# Patient Record
Sex: Male | Born: 1940 | Race: White | Hispanic: No | Smoking: Current every day smoker
Health system: Southern US, Community
[De-identification: ages and names within clinical notes are randomized; demographics above are authoritative.]

## PROBLEM LIST (undated history)

## (undated) DIAGNOSIS — K219 Gastro-esophageal reflux disease without esophagitis: Secondary | ICD-10-CM

## (undated) DIAGNOSIS — M1990 Unspecified osteoarthritis, unspecified site: Secondary | ICD-10-CM

## (undated) DIAGNOSIS — K635 Polyp of colon: Secondary | ICD-10-CM

## (undated) DIAGNOSIS — E785 Hyperlipidemia, unspecified: Secondary | ICD-10-CM

## (undated) DIAGNOSIS — I499 Cardiac arrhythmia, unspecified: Secondary | ICD-10-CM

## (undated) DIAGNOSIS — J449 Chronic obstructive pulmonary disease, unspecified: Secondary | ICD-10-CM

## (undated) DIAGNOSIS — C4490 Unspecified malignant neoplasm of skin, unspecified: Secondary | ICD-10-CM

## (undated) DIAGNOSIS — F329 Major depressive disorder, single episode, unspecified: Secondary | ICD-10-CM

## (undated) DIAGNOSIS — B370 Candidal stomatitis: Secondary | ICD-10-CM

## (undated) DIAGNOSIS — I2510 Atherosclerotic heart disease of native coronary artery without angina pectoris: Secondary | ICD-10-CM

## (undated) DIAGNOSIS — F32A Depression, unspecified: Secondary | ICD-10-CM

## (undated) DIAGNOSIS — F419 Anxiety disorder, unspecified: Secondary | ICD-10-CM

## (undated) DIAGNOSIS — I10 Essential (primary) hypertension: Secondary | ICD-10-CM

## (undated) DIAGNOSIS — I714 Abdominal aortic aneurysm, without rupture: Secondary | ICD-10-CM

## (undated) HISTORY — DX: Unspecified osteoarthritis, unspecified site: M19.90

## (undated) HISTORY — DX: Polyp of colon: K63.5

## (undated) HISTORY — DX: Depression, unspecified: F32.A

## (undated) HISTORY — DX: Cardiac arrhythmia, unspecified: I49.9

## (undated) HISTORY — DX: Unspecified malignant neoplasm of skin, unspecified: C44.90

## (undated) HISTORY — PX: SKIN CANCER EXCISION: SHX779

## (undated) HISTORY — DX: Major depressive disorder, single episode, unspecified: F32.9

## (undated) HISTORY — DX: Abdominal aortic aneurysm, without rupture: I71.4

## (undated) HISTORY — PX: OTHER SURGICAL HISTORY: SHX169

---

## 2008-02-12 HISTORY — PX: TRANSURETHRAL RESECTION OF PROSTATE: SHX73

## 2008-09-08 ENCOUNTER — Ambulatory Visit: Payer: Self-pay | Admitting: Gastroenterology

## 2009-06-28 ENCOUNTER — Ambulatory Visit: Payer: Self-pay | Admitting: Gastroenterology

## 2010-02-28 ENCOUNTER — Ambulatory Visit: Payer: Self-pay | Admitting: Otolaryngology

## 2010-11-21 ENCOUNTER — Ambulatory Visit: Payer: Self-pay | Admitting: Internal Medicine

## 2010-12-24 ENCOUNTER — Other Ambulatory Visit: Payer: Self-pay | Admitting: Internal Medicine

## 2010-12-26 ENCOUNTER — Ambulatory Visit: Payer: Self-pay | Admitting: Internal Medicine

## 2011-02-12 DIAGNOSIS — I714 Abdominal aortic aneurysm, without rupture, unspecified: Secondary | ICD-10-CM

## 2011-02-12 HISTORY — DX: Abdominal aortic aneurysm, without rupture, unspecified: I71.40

## 2011-02-12 HISTORY — DX: Abdominal aortic aneurysm, without rupture: I71.4

## 2011-03-29 ENCOUNTER — Emergency Department: Payer: Self-pay | Admitting: Emergency Medicine

## 2011-03-30 LAB — URINALYSIS, COMPLETE
Bilirubin,UR: NEGATIVE
Glucose,UR: NEGATIVE mg/dL (ref 0–75)
Ketone: NEGATIVE
Ph: 5 (ref 4.5–8.0)
RBC,UR: 37 /HPF (ref 0–5)
Squamous Epithelial: NONE SEEN

## 2011-04-19 ENCOUNTER — Ambulatory Visit: Payer: Self-pay | Admitting: Urology

## 2011-04-19 LAB — BASIC METABOLIC PANEL
BUN: 10 mg/dL (ref 7–18)
Chloride: 107 mmol/L (ref 98–107)
Co2: 27 mmol/L (ref 21–32)
Creatinine: 0.89 mg/dL (ref 0.60–1.30)
EGFR (African American): >60
EGFR (Non-African Amer.): >60

## 2011-04-19 LAB — CBC WITH DIFFERENTIAL/PLATELET
Basophil %: 0.3 %
Eosinophil #: 0.2 10*3/uL (ref 0.0–0.7)
Eosinophil %: 4 %
HCT: 38.5 % — ABNORMAL LOW (ref 40.0–52.0)
HGB: 12.8 g/dL — ABNORMAL LOW (ref 13.0–18.0)
Lymphocyte %: 32.5 %
MCV: 88 fL (ref 80–100)
Monocyte %: 7.5 %
Neutrophil #: 3.2 10*3/uL (ref 1.4–6.5)
Neutrophil %: 55.7 %
RBC: 4.39 10*6/uL — ABNORMAL LOW (ref 4.40–5.90)
RDW: 17.2 % — ABNORMAL HIGH (ref 11.5–14.5)
WBC: 5.7 10*3/uL (ref 3.8–10.6)

## 2011-04-24 ENCOUNTER — Ambulatory Visit: Payer: Self-pay | Admitting: Urology

## 2011-04-25 LAB — CBC WITH DIFFERENTIAL/PLATELET
Basophil %: 0.3 %
Eosinophil #: 0.3 10*3/uL (ref 0.0–0.7)
HGB: 12.3 g/dL — ABNORMAL LOW (ref 13.0–18.0)
Lymphocyte #: 1.9 10*3/uL (ref 1.0–3.6)
MCH: 29.1 pg (ref 26.0–34.0)
MCHC: 32.9 g/dL (ref 32.0–36.0)
MCV: 88 fL (ref 80–100)
Monocyte #: 0.6 10*3/uL (ref 0.0–0.7)
Monocyte %: 8.3 %
Neutrophil #: 4.5 10*3/uL (ref 1.4–6.5)
Neutrophil %: 61.3 %
Platelet: 188 10*3/uL (ref 150–440)
WBC: 7.3 10*3/uL (ref 3.8–10.6)

## 2011-04-25 LAB — BASIC METABOLIC PANEL
Anion Gap: 8 (ref 7–16)
BUN: 7 mg/dL (ref 7–18)
Calcium, Total: 7.9 mg/dL — ABNORMAL LOW (ref 8.5–10.1)
Chloride: 106 mmol/L (ref 98–107)
Glucose: 93 mg/dL (ref 65–99)
Osmolality: 277 (ref 275–301)

## 2011-04-25 LAB — PATHOLOGY REPORT

## 2012-04-14 DIAGNOSIS — R972 Elevated prostate specific antigen [PSA]: Secondary | ICD-10-CM | POA: Insufficient documentation

## 2012-04-14 DIAGNOSIS — N401 Enlarged prostate with lower urinary tract symptoms: Secondary | ICD-10-CM | POA: Insufficient documentation

## 2012-04-20 ENCOUNTER — Ambulatory Visit: Payer: Self-pay | Admitting: Emergency Medicine

## 2012-04-20 DIAGNOSIS — I10 Essential (primary) hypertension: Secondary | ICD-10-CM

## 2012-04-20 LAB — CBC WITH DIFFERENTIAL/PLATELET
Basophil #: 0 10*3/uL (ref 0.0–0.1)
Basophil %: 0.3 %
Eosinophil %: 2 %
HCT: 48.5 % (ref 40.0–52.0)
HGB: 16.3 g/dL (ref 13.0–18.0)
Lymphocyte %: 27.3 %
MCH: 31.1 pg (ref 26.0–34.0)
MCV: 93 fL (ref 80–100)
Monocyte #: 0.5 x10 3/mm (ref 0.2–1.0)
Monocyte %: 6 %
Neutrophil %: 64.4 %
Platelet: 219 10*3/uL (ref 150–440)
RBC: 5.24 10*6/uL (ref 4.40–5.90)
RDW: 14.8 % — ABNORMAL HIGH (ref 11.5–14.5)
WBC: 7.6 10*3/uL (ref 3.8–10.6)

## 2012-04-20 LAB — BASIC METABOLIC PANEL
BUN: 10 mg/dL (ref 7–18)
Calcium, Total: 8.1 mg/dL — ABNORMAL LOW (ref 8.5–10.1)
Chloride: 103 mmol/L (ref 98–107)
Creatinine: 1.03 mg/dL (ref 0.60–1.30)
EGFR (African American): >60
Glucose: 90 mg/dL (ref 65–99)
Osmolality: 269 (ref 275–301)
Sodium: 135 mmol/L — ABNORMAL LOW (ref 136–145)

## 2012-04-21 ENCOUNTER — Inpatient Hospital Stay: Payer: Self-pay | Admitting: Emergency Medicine

## 2012-04-22 LAB — BASIC METABOLIC PANEL
Anion Gap: 2 — ABNORMAL LOW (ref 7–16)
BUN: 10 mg/dL (ref 7–18)
Calcium, Total: 7.6 mg/dL — ABNORMAL LOW (ref 8.5–10.1)
Chloride: 105 mmol/L (ref 98–107)
Co2: 29 mmol/L (ref 21–32)
Creatinine: 1.03 mg/dL (ref 0.60–1.30)
EGFR (African American): >60
Sodium: 136 mmol/L (ref 136–145)

## 2012-04-22 LAB — CBC WITH DIFFERENTIAL/PLATELET
Basophil #: 0 10*3/uL (ref 0.0–0.1)
Eosinophil #: 0 10*3/uL (ref 0.0–0.7)
Eosinophil %: 0 %
HCT: 40.9 % (ref 40.0–52.0)
Lymphocyte %: 14.1 %
MCHC: 33.2 g/dL (ref 32.0–36.0)
MCV: 93 fL (ref 80–100)
Monocyte #: 0.7 x10 3/mm (ref 0.2–1.0)
Monocyte %: 6.7 %
Neutrophil #: 8.3 10*3/uL — ABNORMAL HIGH (ref 1.4–6.5)
Neutrophil %: 79.1 %
Platelet: 192 10*3/uL (ref 150–440)
RBC: 4.41 10*6/uL (ref 4.40–5.90)
RDW: 14.7 % — ABNORMAL HIGH (ref 11.5–14.5)
WBC: 10.5 10*3/uL (ref 3.8–10.6)

## 2012-04-26 LAB — BASIC METABOLIC PANEL
Anion Gap: 5 — ABNORMAL LOW (ref 7–16)
Calcium, Total: 7.4 mg/dL — ABNORMAL LOW (ref 8.5–10.1)
Chloride: 106 mmol/L (ref 98–107)
Glucose: 106 mg/dL — ABNORMAL HIGH (ref 65–99)
Sodium: 136 mmol/L (ref 136–145)

## 2012-04-26 LAB — POTASSIUM: Potassium: 4 mmol/L (ref 3.5–5.1)

## 2012-04-26 LAB — CREATININE, SERUM: Creatinine: 0.99 mg/dL (ref 0.60–1.30)

## 2012-04-27 LAB — PATHOLOGY REPORT

## 2012-05-03 LAB — CREATININE, SERUM: Creatinine: 0.9 mg/dL (ref 0.60–1.30)

## 2013-10-15 ENCOUNTER — Observation Stay: Payer: Self-pay | Admitting: Specialist

## 2013-10-15 LAB — COMPREHENSIVE METABOLIC PANEL
ALBUMIN: 2.8 g/dL — AB (ref 3.4–5.0)
ALT: 27 U/L
ANION GAP: 4 — AB (ref 7–16)
Alkaline Phosphatase: 48 U/L
BUN: 8 mg/dL (ref 7–18)
Bilirubin,Total: 0.5 mg/dL (ref 0.2–1.0)
CREATININE: 1.09 mg/dL (ref 0.60–1.30)
Calcium, Total: 8.2 mg/dL — ABNORMAL LOW (ref 8.5–10.1)
Chloride: 110 mmol/L — ABNORMAL HIGH (ref 98–107)
Co2: 25 mmol/L (ref 21–32)
EGFR (African American): >60
Glucose: 114 mg/dL — ABNORMAL HIGH (ref 65–99)
OSMOLALITY: 277 (ref 275–301)
Potassium: 3.9 mmol/L (ref 3.5–5.1)
SGOT(AST): 15 U/L (ref 15–37)
Sodium: 139 mmol/L (ref 136–145)
Total Protein: 5.8 g/dL — ABNORMAL LOW (ref 6.4–8.2)

## 2013-10-15 LAB — CBC
HCT: 45.9 % (ref 40.0–52.0)
HGB: 15.1 g/dL (ref 13.0–18.0)
MCH: 31.3 pg (ref 26.0–34.0)
MCHC: 32.9 g/dL (ref 32.0–36.0)
MCV: 95 fL (ref 80–100)
Platelet: 189 10*3/uL (ref 150–440)
RBC: 4.84 10*6/uL (ref 4.40–5.90)
RDW: 14.9 % — ABNORMAL HIGH (ref 11.5–14.5)
WBC: 7.2 10*3/uL (ref 3.8–10.6)

## 2013-10-15 LAB — URINALYSIS, COMPLETE
BILIRUBIN, UR: NEGATIVE
Bacteria: NONE SEEN
Blood: NEGATIVE
Glucose,UR: NEGATIVE mg/dL (ref 0–75)
Ketone: NEGATIVE
Nitrite: NEGATIVE
PH: 7 (ref 4.5–8.0)
Protein: 30
RBC,UR: 3 /HPF (ref 0–5)
Specific Gravity: 1.015 (ref 1.003–1.030)
Squamous Epithelial: <1
WBC UR: 4 /HPF (ref 0–5)

## 2013-10-15 LAB — CK TOTAL AND CKMB (NOT AT ARMC)
CK, Total: 64 U/L
CK-MB: 1.8 ng/mL (ref 0.5–3.6)

## 2013-10-15 LAB — TROPONIN I: Troponin-I: <0.02 ng/mL

## 2013-10-16 DIAGNOSIS — R55 Syncope and collapse: Secondary | ICD-10-CM

## 2013-10-16 LAB — BASIC METABOLIC PANEL
ANION GAP: 8 (ref 7–16)
BUN: 11 mg/dL (ref 7–18)
CHLORIDE: 107 mmol/L (ref 98–107)
Calcium, Total: 8 mg/dL — ABNORMAL LOW (ref 8.5–10.1)
Co2: 26 mmol/L (ref 21–32)
Creatinine: 1.07 mg/dL (ref 0.60–1.30)
EGFR (African American): >60
Glucose: 94 mg/dL (ref 65–99)
Osmolality: 280 (ref 275–301)
POTASSIUM: 3.8 mmol/L (ref 3.5–5.1)
Sodium: 141 mmol/L (ref 136–145)

## 2013-10-16 LAB — CBC WITH DIFFERENTIAL/PLATELET
BASOS ABS: 0 10*3/uL (ref 0.0–0.1)
Basophil %: 0.7 %
EOS ABS: 0.2 10*3/uL (ref 0.0–0.7)
EOS PCT: 2.5 %
HCT: 45.8 % (ref 40.0–52.0)
HGB: 15.2 g/dL (ref 13.0–18.0)
Lymphocyte #: 2.1 10*3/uL (ref 1.0–3.6)
Lymphocyte %: 28.4 %
MCH: 31.2 pg (ref 26.0–34.0)
MCHC: 33.3 g/dL (ref 32.0–36.0)
MCV: 94 fL (ref 80–100)
MONOS PCT: 8.6 %
Monocyte #: 0.6 x10 3/mm (ref 0.2–1.0)
NEUTROS ABS: 4.4 10*3/uL (ref 1.4–6.5)
NEUTROS PCT: 59.8 %
PLATELETS: 177 10*3/uL (ref 150–440)
RBC: 4.89 10*6/uL (ref 4.40–5.90)
RDW: 14.9 % — ABNORMAL HIGH (ref 11.5–14.5)
WBC: 7.4 10*3/uL (ref 3.8–10.6)

## 2013-10-16 LAB — TROPONIN I: Troponin-I: <0.02 ng/mL

## 2013-10-16 LAB — CK-MB
CK-MB: 1.8 ng/mL (ref 0.5–3.6)
CK-MB: 1.8 ng/mL (ref 0.5–3.6)

## 2013-11-23 IMAGING — CT CT ABD-PELV W/ CM
1 of 3 series · 12 of 32 positions shown, 18 images · non-contrast
Comparison: none

REASON FOR EXAM: (1) abdominal wound dehiscence; (2) look at the uretes
COMMENTS:

[Series 2: soft tissue · axial · 0.85mm/px · z∈[-858,-452]mm · 12 of 159 slices shown, 18 images]
[im 12/159  soft-tissue]
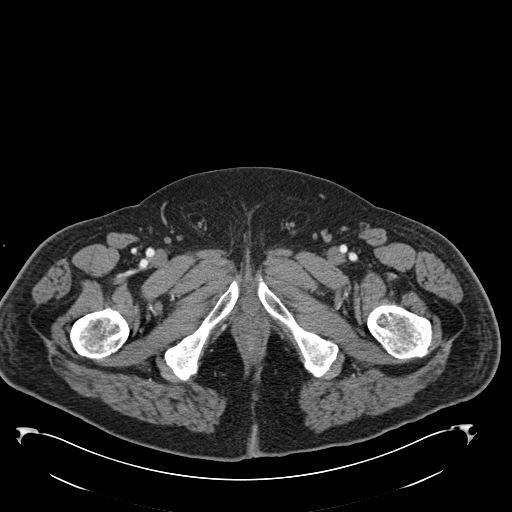
[im 12/159  bone]
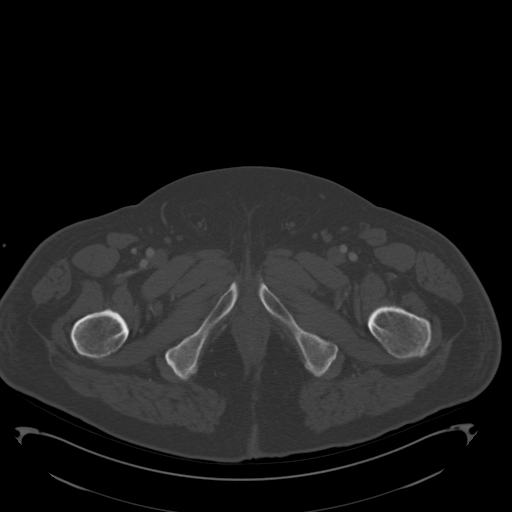
[im 23/159  soft-tissue]
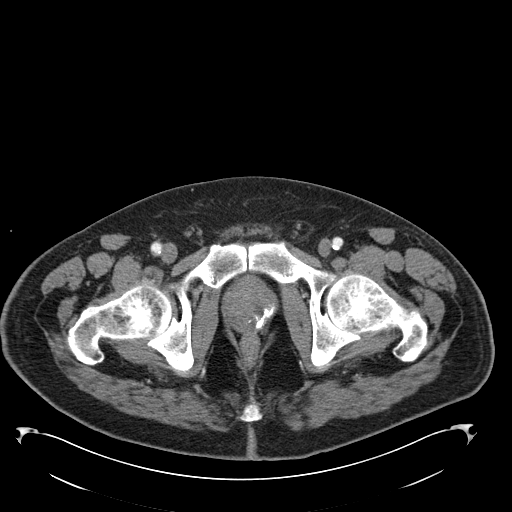
[im 34/159  soft-tissue]
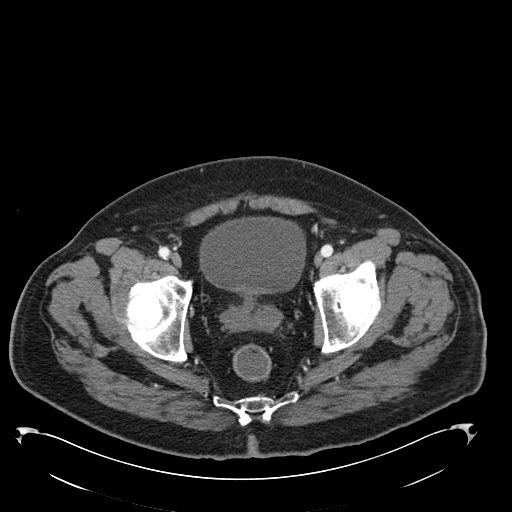
[im 46/159  soft-tissue]
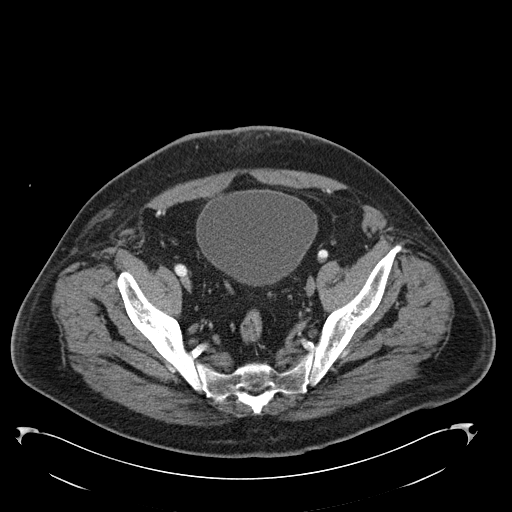
[im 57/159  soft-tissue]
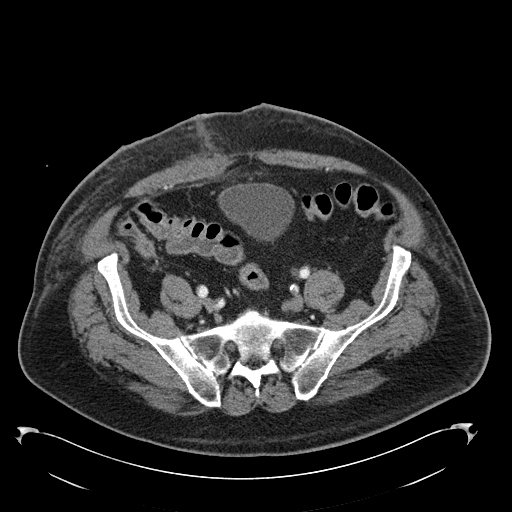
[im 68/159  soft-tissue]
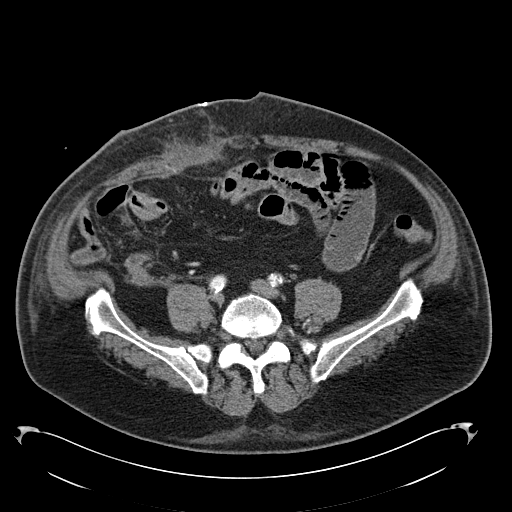
[im 91/159  soft-tissue]
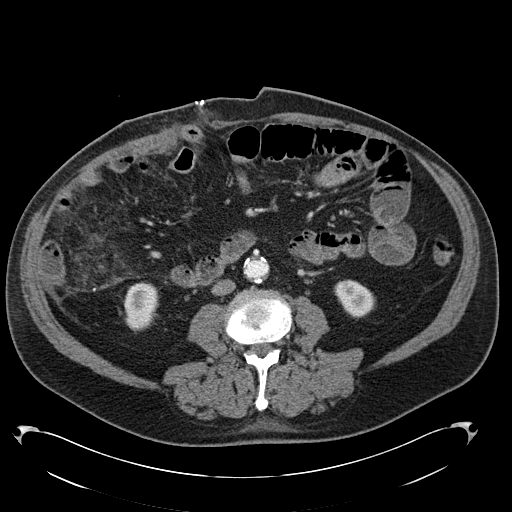
[im 102/159  soft-tissue]
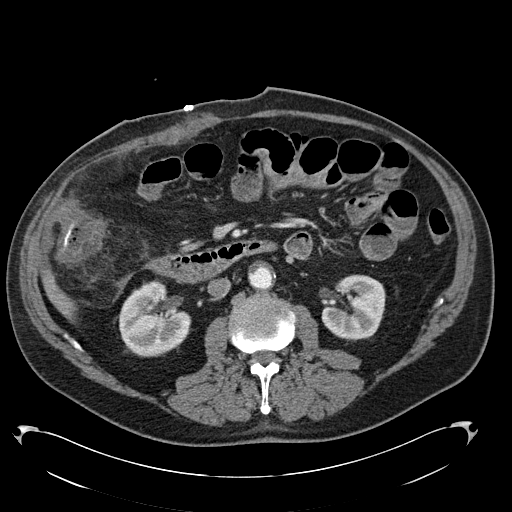
[im 113/159  soft-tissue]
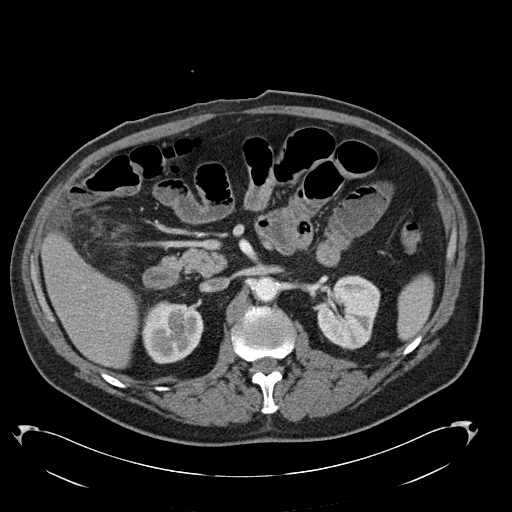
[im 113/159  lung]
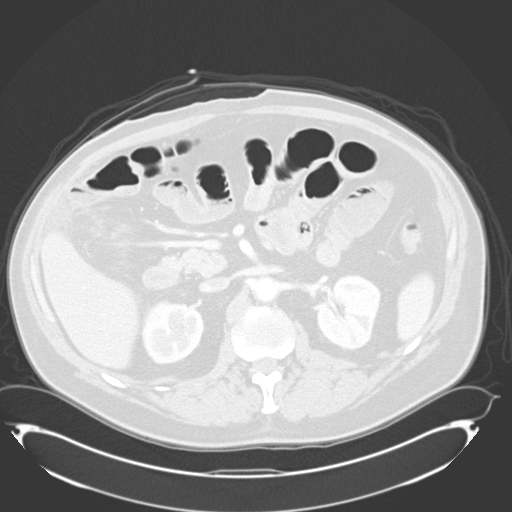
[im 113/159  bone]
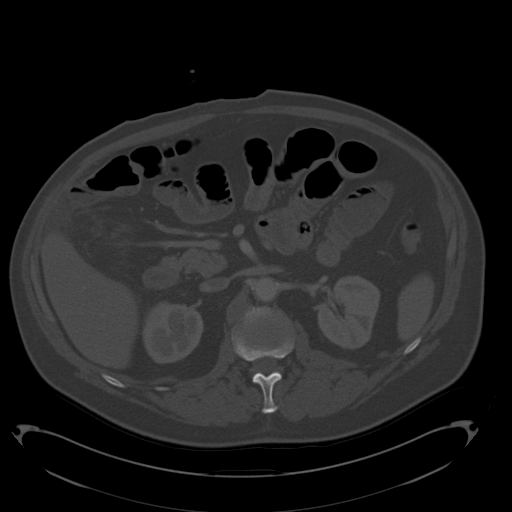
[im 125/159  soft-tissue]
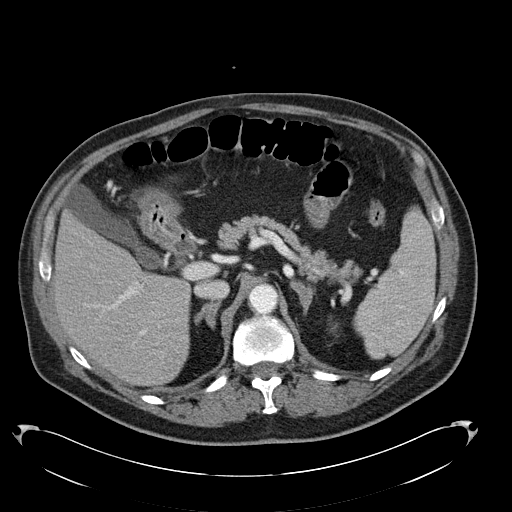
[im 125/159  lung]
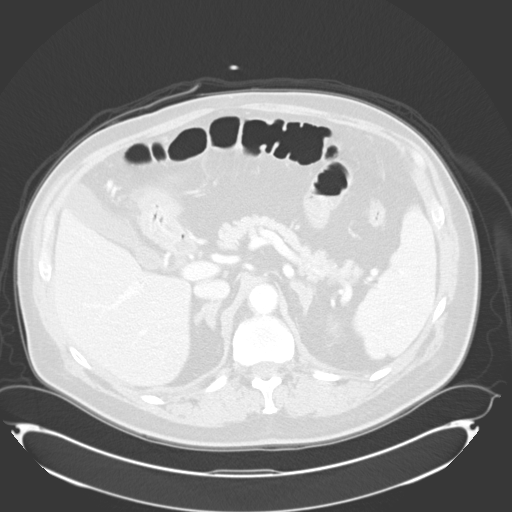
[im 136/159  soft-tissue]
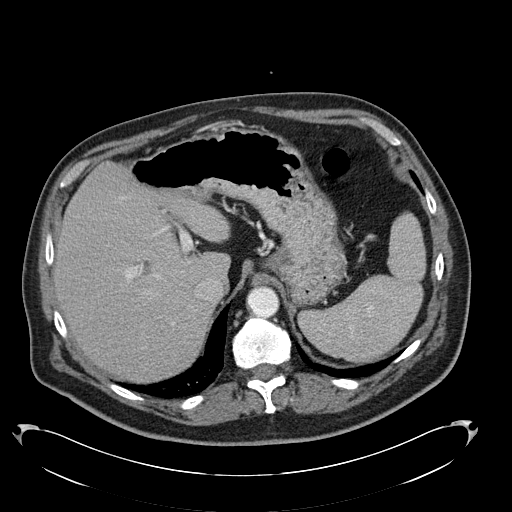
[im 136/159  lung]
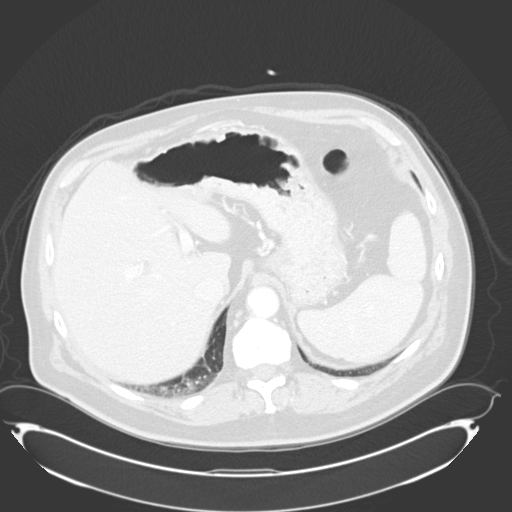
[im 147/159  soft-tissue]
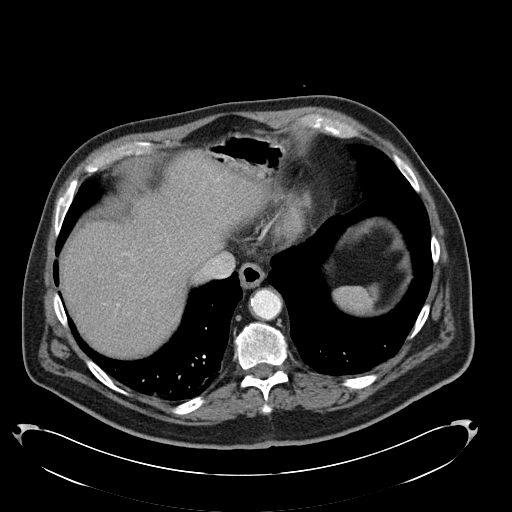
[im 147/159  lung]
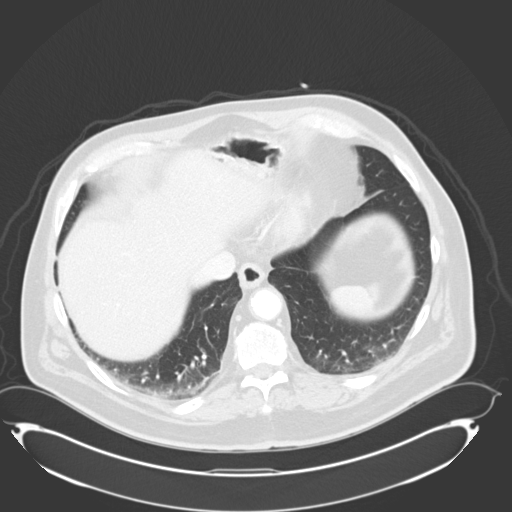

[12 of 32 positions shown; findings below may reference images not displayed]

PROCEDURE:     CT  - CT ABDOMEN / PELVIS  W  - April 29, 2012 [DATE]

RESULT:     Axial CT scanning was performed through the abdomen and pelvis
following administration of 100 cc of [id number] only. The patient is being
evaluated for possible dehiscence of his ventral abdominal incision and for
possible ureteral injury.

Along the anterior abdominal wall at the site of the incision there is
evidence of a defect/hernia in the peritoneum beginning on image 60 and
extending to approximately image 90. Inferior to this there is increased
density within the soft tissues associated with the peritoneal surface and
abdominal wall musculature. A loop of small bowel insinuates itself into the
incisional site/hernia, and while not obstructed could become incarcerated.
This is demonstrated on images 65 through 88. There is a small amount of
soft tissue density reaching the skin surface immediately deep to the
metallic staple line on images 58 through 70 which may reflect a site of the
suspected dehiscence. Lateral to this on image 64 there is a defect in the
skin surface with  increased density in the subcutaneous fat communicating
with the wound which also could reflect a draining sinus tract.

There are inflammatory/postsurgical changes in the mid upper abdomen at the
level of the hepatic flexure. There is no discrete abscess.

The ureters fill well and are normal in caliber with no evidence of injury.
The kidneys are normal in caliber.

The liver, gallbladder, partially distended stomach, pancreas, adrenal
glands, and periaortic and pericaval regions are normal. The spleen is
mildly enlarged measuring at least 15.2 cm in greatest dimension.

The lung bases exhibit mild compressive atelectasis posteriorly.
IMPRESSION: 1. There is no evidence of ureteral injury.
2. There are small amounts of increased soft tissue density within the
subcutaneous fat to the right of midline in the region of the patient's
incision. There is also a ventral/incisional hernia to the right of midline
containing a knuckle of small bowel. No bowel obstruction is evident. I
cannot exclude fistulous tracts to the skin surface either connection with
bowel or with the peritoneal cavity. There is no significant ascites within
the abdominal and pelvic cavities.
3. There are inflammatory-postsurgical changes in the region of the hepatic
flexure of the colon where there is surgical suture material. There is no
discrete abscess.
4. There is no acute hepatobiliary abnormality nor acute urinary tract
abnormality.

[REDACTED]

## 2014-06-03 NOTE — Op Note (Signed)
PATIENT NAME:  Charles Ewing, Charles Ewing MR#:  [id number] DATE OF BIRTH:  04/09/1940  DATE OF PROCEDURE:  04/29/2012  PREOPERATIVE DIAGNOSIS:  Dehiscence of abdominal wound.   POSTOPERATIVE DIAGNOSIS:  Dehiscence of abdominal wound.   PROCEDURE PERFORMED:  Repair of abdominal dehiscence.   SURGEON:  Vella Kohler, MD   INDICATIONS: This patient who had a right hemicolectomy performed developed a severe postoperative ileus and he was vomiting and retching. He felt that there was something broke in the incision and he had some leakage from the abdominal wound which was not getting better and today he was ready to go home  lot of serosanguineous drainage from the wound appeared. I was worried that he might get complete dehiscence so the patient was then brought to surgery.   DESCRIPTION OF PROCEDURE:  Under general anesthesia, the abdomen was then prepped and draped. Old clips were then removed and the abdominal wound was opened. In upper part of the incision there were 3 stitches completely broke and in the lower part of the incision also a couple of stitches broke. In the middle, there were some stitches still left so I irrigated the area, washed it out completely and then very carefully put #2 silk sutures taking the skin and fascia on each side and put in all about 7 or 8 stitches and 10 stitches are applied. In between, I used figure-of-eight sutures with 0 Vicryl sutures taking care to not injure anything. The loops of bowel were under the fascia which I put my finger in and made sure that we closed it together nicely and after that, bolsters were applied on the retention sutures and they were tied on top of it. A pressure dressing was applied.   The patient tolerated the procedure well and was sent to the recovery room in satisfactory condition.    ____________________________ Welford Roche Phylis Bougie, MD msh:si D: 04/29/2012 20:59:51 ET T: 04/29/2012 21:06:42 ET JOB#: [id number]  cc: Fran Neiswonger S. Phylis Bougie, MD,  <Dictator> Sharene Butters MD ELECTRONICALLY SIGNED 05/05/2012 8:56

## 2014-06-03 NOTE — Consult Note (Signed)
PATIENT NAME:  Charles Ewing, Charles Ewing MR#:  [id number] DATE OF BIRTH:  11-05-1940  DATE OF CONSULTATION:  04/21/2012  PRIMARY CARE PHYSICIAN:   Dr. Brynda Greathouse CONSULTING PHYSICIAN:  Tana Conch. Leslye Peer, MD  CONSULT REQUESTED BY:  Dr. Lutricia Feil  REASON FOR CONSULTATION:  Hypertension.   HISTORY OF PRESENT ILLNESS: This is a 74 year old man who had an elective hemicolectomy for a precancerous polyp lesion that was unable to be removed via colonoscopy. The patient does have some postoperative abdominal pain that is very intense, 10 out of 10 in intensity, does not feel like moving around much. The patient's major complaint is these bumps on his gun that has been going on for a while. He was going to schedule with a dentist, but this came up first with the colonoscopy and surgery. He does complain of fatigue. He does have shortness of breath going up stairs. Postop, the patient's blood pressure is 107/73.   PAST MEDICAL HISTORY:  Coronary artery disease, history of anemia, watermelon stomach, gastroesophageal reflux disease, hypertension, hyperlipidemia, anxiety.   PAST SURGICAL HISTORY:  Hemicolectomy and appendix removal today. TURP.   ALLERGIES:  No known drug allergies.   MEDICATIONS:  Include aspirin 81 mg daily, Coreg 3.25 mg twice a day, Dexilant 60 mg daily, Colace 100 mg twice a day, fluoxetine 20 mg twice a day, Imdur 30 mg daily, Klor-Con 20 mEq daily, Lasix 40 mg daily, lorazepam 0.5 mg 1 tablet 3 times a day as needed for anxiety, Nitrostat 0.4 mg every 5 minutes as needed for chest pain, simvastatin 10 mg at bedtime.   SOCIAL HISTORY:  Smokes 1/2 pack per day. Used to drink a lot of beer in the past now very seldom. No drug use and lives alone.   FAMILY HISTORY:  Mother died at 4, had cancer of the kidney, metastatic to lungs. Father was killed at age 51 in a boarding house fight.  REVIEW OF SYSTEMS:   CONSTITUTIONAL: No fever, no chills, no sweats. No weight loss. No weight gain. Positive for  fatigue.  EYES:  He does wear glasses. EARS, NOSE, MOUTH AND THROAT:  Positive for sore throat. He does have these bumps in his mouth that have been going on for a while.  CARDIOVASCULAR:  No chest pain. No palpitations.  RESPIRATORY: Positive for shortness of breath going up the stairs. No sputum. No hemoptysis. No cough.  GASTROINTESTINAL:  Positive for abdominal pain. No nausea. No vomiting. History of blood in the bowel movements.  GENITOURINARY:  Positive for hematuria. No burning on urination.  MUSCULOSKELETAL:  Positive for neck pain.  INTEGUMENT:  No rashes or eruptions.  NEUROLOGIC:  No fainting or blackouts.  PSYCHIATRIC:  No anxiety or depression.  ENDOCRINE:  No thyroid problems. HEMATOLOGIC AND LYMPHATIC:  History of anemia in the past.   PHYSICAL EXAMINATION: VITAL SIGNS: Temperature 98, pulse 89, respirations 18, blood pressure 107/73, pulse oximetry 96% at rest.  GENERAL:  No respiratory distress, lying flat in bed.  EYES: Conjunctivae and lids normal. Pupils equal, round and reactive to light. Extraocular muscles intact. No nystagmus. EARS, NOSE, MOUTH AND THROAT:  Nasal mucosa, no erythema.  Throat, no erythema.  No exudate seen. Small, whitish bumps on the inside of the mouth on the left side and then down underneath the lip.  NECK: No JVD. No bruits. No lymphadenopathy. No thyromegaly. No thyroid nodules palpated.  LUNGS:  Lungs are clear to auscultation. No use of accessory muscles to breathe. No rhonchi, rales or wheeze  heard.  CARDIOVASCULAR: S1, S2 normal. No gallops, rubs or murmurs heard. Carotid upstroke 2+ bilaterally. No bruits.  EXTREMITIES:  Dorsalis pedis pulses 2+ bilaterally. No edema of the lower extremity.  ABDOMEN:  Soft. Positive tenderness throughout. Hypoactive bowel sounds.  LYMPHATIC:  No lymph nodes in the neck.  MUSCULOSKELETAL:  No clubbing, edema or cyanosis.  SKIN:  No rashes or lesions seen.   NEUROLOGIC:  Cranial nerves II through XII  grossly intact. Deep tendon reflexes 2+ bilateral lower extremities.  PSYCHIATRIC: The patient is oriented to person, place and time.  LABORATORY AND RADIOLOGICAL DATA:  Preop labs included a glucose of 90, BUN 10, creatinine 1.03, sodium 135, potassium 3.6, chloride 103, CO2 25, calcium 8.1. White blood cell count 7.6, hemoglobin and hematocrit 16.3 and 48.5, platelet count of 219.   EKG: Normal sinus rhythm, no acute ST or T wave changes   ASSESSMENT AND PLAN: 1.  Relative hypotension postoperative with history of hypertension. Will continue IV fluid hydration, get serial hemoglobins. Watch for postoperative blood loss. Currently we will hold blood pressure medications at this time.  2.  Hyperlipidemia. We will hold Zocor at this time while n.p.o.  3. Anxiety and depression. We will give p.r.n. IV Ativan as needed for anxiety. We will try to continue fluoxetine, but may be limited with surgery on whether the patient is able to take anything orally.  4.  History of coronary artery disease. Hold the aspirin at this time. Hold Coreg and Imdur at this time with relative hypotension. No chest pain at this point.  5.  Small bones in the mouth. The patient has no history of chewing tobacco but does have a history of alcohol and smoking. I will try Dukes mouthwash to see if this will improve these bumps, if not we will have to get over to the ear, nose and throat specialist for a possible biopsy and that can be done as outpatient when the patient has recovered from the surgery.  6.  Tobacco abuse. Smoking cessation counseling done, 3 minutes by me. Nicotine patch PR if needed for nicotine craving.  7.  Status post hemicolectomy. Pain control with morphine pump.  SURGICAL FOLLOW:  Serial hemoglobins, continue to monitor closely.  TIME SPENT ON CONSULTATION:  55 minutes  CODE STATUS:  THE PATIENT IS A FULL CODE.      ____________________________ Tana Conch. Leslye Peer, MD rjw:ce D: 04/21/2012  17:27:45 ET T: 04/21/2012 17:42:04 ET JOB#: [id number]  cc: Tana Conch. Leslye Peer, MD, <Dictator>  Dr. Brynda Greathouse  Dr. Foye Clock MD ELECTRONICALLY SIGNED 04/23/2012 19:04

## 2014-06-03 NOTE — Discharge Summary (Signed)
PATIENT NAME:  Charles Ewing, Charles Ewing MR#:  [id number] DATE OF BIRTH:  05/27/1940  DATE OF ADMISSION:  04/21/2012 DATE OF DISCHARGE:  05/03/2012  HOSPITAL COURSE: This patient was admitted to the hospital on 04/21/2012 after a right hemicolectomy. This is an obese gentleman who had polyp in the ascending colon which could not be removed by the colonoscope because it was carpet-like flat lesion. The patient was then brought to surgery for right hemicolectomy and omentectomy was performed. The patient, due to obesity had a lot of omental fat, a lot of adhesions on the right side of the colon and it was a difficult surgery but an end-to-end anastomosis was then performed. Postoperatively the patient  had a lot of abdominal distention and one day was throwing up and due to ileus and it looked like some fluid came out through the abdominal wound, I tried to treat him conservatively. He started moving his bowels, eating okay but there was a lot of serosanguineous drainage from the wound, so I did a CT scan of the abdomen and CT scan of the abdomen did not show any other abnormality except showed dehiscence of the abdominal wound. The patient was then brought to surgery again and on 04/29/2012 I repaired dehiscence of abdominal wound with some suturing in the fascia as well as retention sutures were applied postoperatively.  He did very well after that he started moving bowels in 2 days and started eating.   He was then seen by Dr. Jamal Collin for me postoperatively him and he was then discharged by him home on the 23rd of March in satisfactory condition. The patient, at the time of discharge was doing very well with no fever. There was no drainage. He was eating well and his home medications were then filled up.  Those were Lasix 40 mg daily, fluoxetine 20 mg daily, carvedilol 3.125 mg oral tablets 2 times a day, docusate sodium 100 mg tablet twice a day, Nitrostat sublingually if he needs for chest pain. The patient was also  given aspirin 81 mg at home, Dexilant 60 mg oral delayed release the capsule, K-Chlor 20 mg oral tablet once a day, simvastatin 10 mg once a day, lorazepam 0.5 mg oral tablet 3 times a day and Percocet 1 tablet q.4-6h. p.r.n. for pain. He was instructed to change the dressing, and if any problem to call Dr. Sherilyn Banker office.  I will see him in the office and will take his retention sutures out. 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     ____________________________ Welford Roche. Phylis Bougie, MD msh:ct D: 05/05/2012 09:11:24 ET T: 05/05/2012 09:49:52 ET JOB#: [id number]  cc: Coltyn Hanning S. Phylis Bougie, MD, <Dictator> Sharene Butters MD ELECTRONICALLY SIGNED 05/05/2012 17:30

## 2014-06-03 NOTE — Op Note (Signed)
PATIENT NAME:  Charles Ewing, Charles Ewing MR#:  [id number] DATE OF BIRTH:  03-10-1940  DATE OF PROCEDURE:  04/21/2012  PREOPERATIVE DIAGNOSIS: Polyp in the ascending colon.   POSTOPERATIVE DIAGNOSIS: Polyp in the ascending colon.   PROCEDURE:  1.  Right hemicolectomy.  2.  Omentectomy.   OPERATIVE FINDINGS:  1.  The patient had area which was marked with [place] ink that was noticed and removed from the right colon. Removed right colon specimen after that.  2.  The patient had history of diverticulitis in the past and had a lot of inflammation of the sigmoid colon, chronic inflammation which was stuck to the right side of the colon. The sigmoid colon was quite redundant. 3.  The patient also had a very large omentum. Because of omental adhesions, it was a difficult surgery.   DESCRIPTION OF PROCEDURE: After the patient was put to sleep, the abdomen was opened from a right paramedian incision. After cutting skin and subcutaneous tissue, the fascia was cut. The abdomen was then entered. After entering the abdomen, the small bowel was then packed. I noticed that sigmoid colon was quite redundant and was stuck to the right side of the pelvic wall. It looked like that maybe he had previous diverticulitis and was stuck to that bowel, so this colon was then released and pushed back medially. It was a chronic inflammation, and the patient had a lot of fat in the omentum, and in the right upper quadrant I could see the area of the colon where he had [place] ink marked. The line of Toldt incision was made and the cecum was then brought medially slowly and slowly, and with the Harmonic scalpel the blood vessels were then grasped and burnt. The hepatic flexure was difficult to release because of a lot of adhesions and a lot of omentum stuck. This was also then released with the Harmonic scalpel. Care was taken not to injure the duodenum or the ureter on the right side, and the colon was mobilized medially. The main problem  was the omentum, which was very badly stuck to the transverse colon, and the etiology of that was not known so I did a partial omentectomy, and then finally we divided the terminal ileum and also divided the transverse colon and did an end-to-end anastomosis with the GIA and TA 60. Multiple sutures of 3-0 silk were also applied to make sure the anastomosis remained patent, and I did not see any obvious leak anywhere. After that, irrigation of the abdomen was performed. The patient did have a lot of oozing because of these adhesions, so I did put a drain in on the right side and brought out from a separate stab wound. After that, the abdomen was closed with running peritoneum with 0 Vicryl, and fascia was closed with interrupted 0 Vicryl sutures. Skin was closed with staples. The patient tolerated the procedure well and was sent to the recovery room in satisfactory condition.   ____________________________ Welford Roche Phylis Bougie, MD msh:jm D: 04/21/2012 17:06:52 ET T: 04/21/2012 18:02:59 ET JOB#: [id number]  cc: Masud S. Phylis Bougie, MD, <Dictator> Sharene Butters MD ELECTRONICALLY SIGNED 04/23/2012 12:38

## 2014-06-04 NOTE — H&P (Signed)
PATIENT NAME:  Charles Ewing, Charles Ewing MR#:  [id number] DATE OF BIRTH:  [birth year]  DATE OF ADMISSION:  10/15/2013  ADMITTING PHYSICIAN: Charles Lighter, MD.   PRIMARY CARE PHYSICIAN: Charles Ewing.   CHIEF COMPLAINT: Syncope.  HISTORY OF PRESENT ILLNESS: Charles Ewing is 74 year old, elderly, Caucasian male with a past medical history significant for coronary artery disease, history of anemia, gastroesophageal reflux disease, hypertension, hyperlipidemia and diet-controlled diabetes mellitus, who presents to the hospital secondary to a syncopal episode. Most of the history is obtained from the patient and also the son at bedside who witnessed the syncope. The patient had recent skin cancer surgery done on his forehead and also his nose. He was getting ready for a follow-up visit today. He was fine up until this morning. He sat in the chair and suddenly he had an aura of not feeling well, lightheaded and then a minute later he passed out. No witnessed seizures. He probably was out for a few minutes and then was alert but confused a little bit, according to the son. His blood pressure was 90/52. His heart rate was in the 40s when he brought over here. Denies any symptoms at this time. No nausea, vomiting, chest pain.   PAST MEDICAL HISTORY:  1. Coronary artery disease.  2. Skin cancer.  3. Anemia of chronic disease.  4. Hypertension.  5. Depression.  6. Anxiety.  7. Gastroesophageal reflux disease.   PAST SURGICAL HISTORY:  1. Hemicolectomy done for refractory polyps.  2. Appendectomy.  3. Urethral resection of prostate.   ALLERGIES TO MEDICATIONS: No known drug allergies.   CURRENT HOME MEDICATIONS:  1. Aspirin 81 mg p.o. daily.  2. Carvedilol 12.5 mg p.o. b.i.d.  3. Colace 100 mg p.o. b.i.d.  4. Fluoxetine 20 mg p.o. daily.  5. Lasix 40 mg p.o. daily.  6. Imdur 30 mg p.o. daily.  7. Ativan 0.5 mg every 8 hours p.r.n. for anxiety.  8. Potassium chloride 20 mEq p.o. daily.  9. Amantadine 150 mg  p.o. b.i.d.  10. Simvastatin 10 mg p.o. daily.   SOCIAL HISTORY: Lives at home by himself, very self sufficient. Continues to smoke about 1 pack per day. Drinks beer occasionally. No drug use.   FAMILY HISTORY: Significant for renal cell carcinoma in mother, which was metastatic to lungs. Father died from an accident.   REVIEW OF SYSTEMS:  CONSTITUTIONAL: No fever, fatigue, or weakness.  EYES: Positive for blurry vision and cataracts. No glaucoma or inflammation.  ENT: No tinnitus, ear pain, hearing loss, epistaxis or discharge.  RESPIRATORY: No cough disease, wheeze, hemoptysis or COPD.  CARDIOVASCULAR: No chest pain, orthopnea, edema, arrhythmia, palpitations or syncope.  GASTROINTESTINAL: No nausea, vomiting, diarrhea, abdominal pain, hematemesis or melena.   GENITOURINARY: No dysuria, hematuria, renal calculus, frequency or incontinence.  ENDOCRINE: No polyuria, nocturia, thyroid problems, heat or cold intolerance.  HEMATOLOGY: No anemia, easy bruising or bleeding.  SKIN: No acne, rash or lesions.  MUSCULOSKELETAL: No neck, back, shoulder pain, arthritis or gout.  NEUROLOGIC: No numbness, weakness, CVA, TIA or seizures.  PSYCHOLOGICAL: No anxiety, insomnia or depression.   PHYSICAL EXAMINATION: VITAL SIGNS: Temperature 97.8 degrees Fahrenheit, pulse 63, respirations 18, blood pressure 120/76, pulse oximetry 97% on room air.  GENERAL: Reveals a heavy-built, well-nourished male lying in bed, not in any acute distress.  HEENT: Normocephalic. The patient has a scar with clean sutures on his forehead and also on the nose with dressing in place, which looks like it is well healing. Extraocular movements are  intact. Oropharynx is clear without erythema, mass or exudates.  NECK: Supple. No thyromegaly, JVD or carotid bruits. No lymphadenopathy.  LUNGS: Moving air bilaterally. Coarse rhonchi heard with diffuse wheezing scattered bilaterally. No use of accessory muscles for breathing.   CARDIOVASCULAR: S1, S2. Regular rate and rhythm. No murmurs, rubs or gallops.  ABDOMEN: Obese, soft, nontender, nondistended. No hepatosplenomegaly. Normal bowel sounds.  EXTREMITIES: No pedal edema. No clubbing or cyanosis, 2+ dorsalis pedis pulses palpable bilaterally.  SKIN: No acne, rash or lesions other than the sutures described above.  LYMPHATICS: No cervical or inguinal lymphadenopathy.  NEUROLOGIC: Cranial nerves intact. No focal motor or sensory deficits.  PSYCHOLOGICAL: The patient is awake, alert, oriented x 3.   LABORATORY DATA: WBC 7.2, hemoglobin 15.1, hematocrit 45.9, platelet count 189,000. Sodium 139, potassium 3.9, chloride 110, bicarbonate 25, BUN 8, creatinine 1.09, glucose 114, and calcium of 8.2. ALT 27, AST 15, alkaline phosphatase 48, total bilirubin 0.5,  albumin of 2.3. Troponins negative. Urinalysis negative for any infection.   DIAGNOSTIC DATA: CT of the head without contrast showing no evidence of acute intracranial abnormality, atrophy with chronic small vessel ischemic changes and chest x-ray showing clear lung fields. No acute cardiopulmonary process. EKG showing normal sinus rhythm, heart rate of 61. No acute ST-T wave abnormalities.   ASSESSMENT AND PLAN: This is a 74 year old male with history of heart disease, gastritis, gastroesophageal reflux disease, hypertension, admitted for syncope.  1. Syncope, likely vasovagal syncope. Blood pressure is borderline low. He is on Lasix, so we will hold the diuretics, check orthostatic vital signs. We will not give fluids just hold the diuretics at this time. Also check echo and also carotid Dopplers at this time and also neurochecks as needed.  2. Coronary artery disease, appears stable. No chest pain. Troponins will be monitored x 3. He is on Coreg, aspirin, Imdur which will be continued at this time.  3. Chronic obstructive pulmonary disease. Does not take any inhalers at home. We will DuoNebs because of scattered wheeze,  but chest x-ray is clear. No need for any antibiotics.  4. Depression and anxiety. Continue fluoxetine and Ativan p.r.n.   5. Gastroesophageal reflux disease. Continue proton pump inhibitor.  6. Skin cancer surgery. Follow up with [facility] dermatology.   7. Deep vein thrombosis prophylaxis.  8. The patient was on the waiting list to be transferred to [place]; however, they were on diversion, so he is being admitted here.  9. Tobacco use disorder. Strongly counseled against smoking for up to 3 minutes and will be started on nicotine patch here.   TIME SPENT ON ADMISSION: 50 minutes.     ____________________________ Charles Lighter, MD rk:TT D: 10/15/2013 19:52:32 ET T: 10/15/2013 21:18:52 ET JOB#: [id number]  cc: Charles Lighter, MD, <Dictator> Charles Lighter MD ELECTRONICALLY SIGNED 10/18/2013 17:05

## 2014-06-04 NOTE — Discharge Summary (Signed)
PATIENT NAME:  Charles Ewing, Charles Ewing MR#:  [id number] DATE OF BIRTH:  January 02, 1941  DATE OF ADMISSION:  10/15/2013 DATE OF DISCHARGE:  10/16/2013  For a detailed note, please take a look at the history and physical done on admission by Dr. Gladstone Lighter.   DIAGNOSES AT DISCHARGE: As follows: 1. Syncope, likely vasovagal in nature.  2. Hypertension.  3. Hyperlipidemia.  4. History of cardiomyopathy, ejection fraction of 35%.  5. Skin cancer.   DIET: The patient is being discharged on a low-sodium, low-fat diet.   ACTIVITY: As tolerated.   FOLLOWUP: At the [facility] in the next 1 to 2 weeks.   DISCHARGE MEDICATIONS: 1. Simvastatin 10 mg at bedtime.  2. Aspirin 81 mg daily.  3. Coreg 12.5 mg b.i.d. 4. Colace 100 mg b.i.d. 5. Fluoxetine 20 mg daily. 6. Imdur 30 mg daily.  7. Potassium 20 mEq daily. 8. Lasix 40 mg daily. 9. Lorazepam 0.5 mg q.8 hours as needed. 10. Ranitidine 150 mg b.i.d.   PERTINENT STUDIES DONE DURING THE HOSPITAL COURSE: Are as follows: A CT scan of the head done without contrast on admission showing no evidence of acute intracranial abnormality. A chest x-ray done on admission showing no evidence of acute cardiopulmonary disease. A 2-dimensional echocardiogram done showing ejection fraction of 30% to 35%, moderately decreased global LV systolic function, severe mid distal anterior, anteroseptal and apical hypokinesis, moderate LVH, mildly increased left ventricular cavity size. An ultrasound of the carotids showing no evidence of any hemodynamically significant carotid artery stenosis.   HOSPITAL COURSE: This is a 74 year old male, with medical problems as mentioned above, who presented to the hospital with a syncopal episode.   1. Syncope. The most likely cause of the patient's syncope was vasovagal in nature. The patient was observed on telemetry, had no evidence of acute arrhythmias. His carotid duplex showed no evidence of hemodynamically significant carotid artery  stenosis. His CT head was negative. His orthostatic vital signs were negative. He has had no further episodes of syncope. Therefore, he is being discharged home. His echocardiogram showed no thrombus, but did show an EF of 35%, but I think this is chronic from his previous cardiomyopathy. Since the patient is clinically asymptomatic, he is being discharged home.  2. History of congestive heart failure and cardiomyopathy. The patient's Lasix was initially held due to his syncopal episode, although clinically, since the patient is not in CHF, he will resume his Lasix, Coreg and Imdur upon discharge.  3. Hypertension. The patient remained hemodynamically stable and no evidence of orthostasis. He will continue his Coreg and Imdur.  4. Hyperlipidemia. The patient was maintained on his simvastatin. He will resume that.   CODE STATUS: The patient is a full code.   DISPOSITION: He is being discharged home.   TIME SPENT: 40 minutes.   ____________________________ Belia Heman. Verdell Carmine, MD vjs:lb D: 10/16/2013 12:51:51 ET T: 10/16/2013 13:39:52 ET JOB#: [id number]  cc: Belia Heman. Verdell Carmine, MD, <Dictator> [place] MD ELECTRONICALLY SIGNED 10/20/2013 15:54

## 2014-06-05 NOTE — Op Note (Signed)
PATIENT NAME:  Charles Ewing, Charles Ewing MR#:  [id number] DATE OF BIRTH:  [birth year]  DATE OF PROCEDURE:  04/24/2011  PREOPERATIVE DIAGNOSES:  1. Benign prostatic hypertrophy.  2. Urinary retention secondary to above.   POSTOPERATIVE DIAGNOSES: 1. Benign prostatic hypertrophy.  2. Urinary retention secondary to above.   PROCEDURE: Transurethral resection of prostate.   SURGEON: Letizia Hook C. Bernardo Heater, MD   ASSISTANT: None.   ANESTHETIC: General.   INDICATIONS: The patient is a 74 year old male with acute onset of urinary retention in early February. He has failed voiding trials x3 on alpha blockade. Cystoscopy showed lateral lobe enlargement. After discussion of options, he has elected to proceed with TURP.   DESCRIPTION OF PROCEDURE: The patient was taken to the operating room where general anesthetic was administered. He was placed in the low lithotomy position and his external genitalia were prepped and draped sterilely. Time-out was performed. A 27 French continuous flow resectoscope sheath with visual obturator was lubricated and passed under direct vision into the bladder without difficulty. The [facility] resectoscope with loop was then placed into the sheath. Prostatic length was 3 cm. Lateral lobes were touching. There was a fairly marked bladder neck elevation without significant median lobe tissue. Initially a portion of the elevated bladder neck was resected taking care not to undermine the trigone. Starting at the 2 o'clock position, the left lateral lobe was resected with limits the bladder neck proximally and verumontanum distally. This resection was carried towards the 6 o'clock position. All chips were removed with irrigation. Hemostasis was obtained with cautery. The right lateral lobe was resected in a similar fashion starting at the 10 o'clock position working towards the 6 o'clock position. Additional adenoma at the verumontanum was resected taking care not to resect distally. The loop was  then replaced with a button electrode and hemostasis was obtained. Additional areas of adenoma at the verumontanum were vaporized. At the completion of the procedure, hemostasis was adequate. No occlusive tissue was identified with the scope positioned at the verumontanum. The ureteral orifices were intact. The resectoscope was removed. A 24 French three-way catheter with catheter guide was then easily placed into the bladder. 30 mL of water were placed in the balloon. The catheter was irrigated with a light rosae effluent. The catheter was placed to CBI on a low moderate flow with light rosea effluent. A B and O suppository was placed per rectum. The patient was taken to PAC-U in stable condition. There were no complications. EBL was approximately 50 mL.   ____________________________ Ronda Fairly. Bernardo Heater, MD scs:drc D: 04/25/2011 08:30:38 ET T: 04/25/2011 10:49:19 ET JOB#: [id number]  cc: Nicki Reaper C. Bernardo Heater, MD, <Dictator> Abbie Sons MD ELECTRONICALLY SIGNED 05/01/2011 11:07

## 2015-02-28 ENCOUNTER — Emergency Department: Payer: Medicare Other

## 2015-02-28 ENCOUNTER — Inpatient Hospital Stay
Admission: EM | Admit: 2015-02-28 | Discharge: 2015-03-01 | DRG: 641 | Disposition: A | Discharge status: Home / Self Care | Payer: Medicare Other | Attending: Internal Medicine | Admitting: Internal Medicine

## 2015-02-28 DIAGNOSIS — Z85828 Personal history of other malignant neoplasm of skin: Secondary | ICD-10-CM

## 2015-02-28 DIAGNOSIS — B370 Candidal stomatitis: Secondary | ICD-10-CM | POA: Diagnosis present

## 2015-02-28 DIAGNOSIS — F17200 Nicotine dependence, unspecified, uncomplicated: Secondary | ICD-10-CM | POA: Diagnosis present

## 2015-02-28 DIAGNOSIS — J449 Chronic obstructive pulmonary disease, unspecified: Secondary | ICD-10-CM | POA: Diagnosis present

## 2015-02-28 DIAGNOSIS — Z79899 Other long term (current) drug therapy: Secondary | ICD-10-CM | POA: Diagnosis not present

## 2015-02-28 DIAGNOSIS — Z7982 Long term (current) use of aspirin: Secondary | ICD-10-CM

## 2015-02-28 DIAGNOSIS — E785 Hyperlipidemia, unspecified: Secondary | ICD-10-CM | POA: Diagnosis present

## 2015-02-28 DIAGNOSIS — I10 Essential (primary) hypertension: Secondary | ICD-10-CM | POA: Diagnosis present

## 2015-02-28 DIAGNOSIS — R531 Weakness: Secondary | ICD-10-CM

## 2015-02-28 DIAGNOSIS — K219 Gastro-esophageal reflux disease without esophagitis: Secondary | ICD-10-CM | POA: Diagnosis present

## 2015-02-28 DIAGNOSIS — F419 Anxiety disorder, unspecified: Secondary | ICD-10-CM | POA: Diagnosis present

## 2015-02-28 DIAGNOSIS — E871 Hypo-osmolality and hyponatremia: Secondary | ICD-10-CM | POA: Diagnosis present

## 2015-02-28 DIAGNOSIS — I959 Hypotension, unspecified: Secondary | ICD-10-CM | POA: Diagnosis present

## 2015-02-28 DIAGNOSIS — I951 Orthostatic hypotension: Secondary | ICD-10-CM | POA: Diagnosis present

## 2015-02-28 DIAGNOSIS — E860 Dehydration: Secondary | ICD-10-CM

## 2015-02-28 DIAGNOSIS — R42 Dizziness and giddiness: Secondary | ICD-10-CM | POA: Diagnosis not present

## 2015-02-28 DIAGNOSIS — R55 Syncope and collapse: Secondary | ICD-10-CM

## 2015-02-28 DIAGNOSIS — I2510 Atherosclerotic heart disease of native coronary artery without angina pectoris: Secondary | ICD-10-CM | POA: Diagnosis present

## 2015-02-28 DIAGNOSIS — H6690 Otitis media, unspecified, unspecified ear: Secondary | ICD-10-CM | POA: Diagnosis present

## 2015-02-28 HISTORY — DX: Essential (primary) hypertension: I10

## 2015-02-28 HISTORY — DX: Chronic obstructive pulmonary disease, unspecified: J44.9

## 2015-02-28 HISTORY — DX: Hyperlipidemia, unspecified: E78.5

## 2015-02-28 HISTORY — DX: Candidal stomatitis: B37.0

## 2015-02-28 HISTORY — DX: Anxiety disorder, unspecified: F41.9

## 2015-02-28 HISTORY — DX: Atherosclerotic heart disease of native coronary artery without angina pectoris: I25.10

## 2015-02-28 HISTORY — DX: Gastro-esophageal reflux disease without esophagitis: K21.9

## 2015-02-28 LAB — CBC WITH DIFFERENTIAL/PLATELET
BASOS ABS: 0 10*3/uL (ref 0–0.1)
BASOS PCT: 0 %
EOS ABS: 0.1 10*3/uL (ref 0–0.7)
EOS PCT: 1 %
HCT: 44 % (ref 40.0–52.0)
Hemoglobin: 14.6 g/dL (ref 13.0–18.0)
LYMPHS PCT: 25 %
Lymphs Abs: 2.6 10*3/uL (ref 1.0–3.6)
MCH: 30.6 pg (ref 26.0–34.0)
MCHC: 33.1 g/dL (ref 32.0–36.0)
MCV: 92.4 fL (ref 80.0–100.0)
Monocytes Absolute: 0.8 10*3/uL (ref 0.2–1.0)
Monocytes Relative: 8 %
Neutro Abs: 7 10*3/uL — ABNORMAL HIGH (ref 1.4–6.5)
Neutrophils Relative %: 66 %
PLATELETS: 168 10*3/uL (ref 150–440)
RBC: 4.76 MIL/uL (ref 4.40–5.90)
RDW: 16 % — ABNORMAL HIGH (ref 11.5–14.5)
WBC: 10.6 10*3/uL (ref 3.8–10.6)

## 2015-02-28 LAB — COMPREHENSIVE METABOLIC PANEL
ALBUMIN: 3.2 g/dL — AB (ref 3.5–5.0)
ALT: 34 U/L (ref 17–63)
AST: 21 U/L (ref 15–41)
Alkaline Phosphatase: 33 U/L — ABNORMAL LOW (ref 38–126)
Anion gap: 7 (ref 5–15)
BUN: 26 mg/dL — AB (ref 6–20)
CHLORIDE: 95 mmol/L — AB (ref 101–111)
CO2: 26 mmol/L (ref 22–32)
CREATININE: 1.28 mg/dL — AB (ref 0.61–1.24)
Calcium: 8.2 mg/dL — ABNORMAL LOW (ref 8.9–10.3)
GFR calc Af Amer: >60 mL/min (ref >60)
GFR calc non Af Amer: 53 mL/min — ABNORMAL LOW (ref >60)
GLUCOSE: 126 mg/dL — AB (ref 65–99)
Potassium: 3.9 mmol/L (ref 3.5–5.1)
SODIUM: 128 mmol/L — AB (ref 135–145)
Total Bilirubin: 1.1 mg/dL (ref 0.3–1.2)
Total Protein: 6 g/dL — ABNORMAL LOW (ref 6.5–8.1)

## 2015-02-28 LAB — TROPONIN I: Troponin I: <0.03 ng/mL (ref <0.031)

## 2015-02-28 MED ORDER — LEVOFLOXACIN 500 MG PO TABS
750.0000 mg | ORAL_TABLET | Freq: Every day | ORAL | Status: DC
  Administered 2015-02-28 – 2015-03-01 (×2): 750 mg via ORAL
  Filled 2015-02-28 (×2): qty 1

## 2015-02-28 MED ORDER — NICOTINE 14 MG/24HR TD PT24
14.0000 mg | MEDICATED_PATCH | Freq: Every day | TRANSDERMAL | Status: DC
  Administered 2015-02-28 – 2015-03-01 (×2): 14 mg via TRANSDERMAL
  Filled 2015-02-28 (×2): qty 1

## 2015-02-28 MED ORDER — ACETAMINOPHEN 325 MG PO TABS: 650.0000 mg | ORAL_TABLET | Freq: Four times a day (QID) | ORAL | Status: DC | PRN

## 2015-02-28 MED ORDER — ACETAMINOPHEN 650 MG RE SUPP: 650.0000 mg | Freq: Four times a day (QID) | RECTAL | Status: DC | PRN

## 2015-02-28 MED ORDER — LORATADINE 10 MG PO TABS
10.0000 mg | ORAL_TABLET | Freq: Every evening | ORAL | Status: DC
  Administered 2015-02-28: 10 mg via ORAL
  Filled 2015-02-28: qty 1

## 2015-02-28 MED ORDER — FLUCONAZOLE 100 MG PO TABS
200.0000 mg | ORAL_TABLET | Freq: Every day | ORAL | Status: DC
  Administered 2015-02-28 – 2015-03-01 (×2): 200 mg via ORAL
  Filled 2015-02-28 (×2): qty 2

## 2015-02-28 MED ORDER — AMMONIUM LACTATE 12 % EX LOTN
TOPICAL_LOTION | CUTANEOUS | Status: DC | PRN
  Filled 2015-02-28: qty 400

## 2015-02-28 MED ORDER — FLUOXETINE HCL 20 MG PO CAPS
20.0000 mg | ORAL_CAPSULE | Freq: Two times a day (BID) | ORAL | Status: DC
  Administered 2015-02-28 – 2015-03-01 (×2): 20 mg via ORAL
  Filled 2015-02-28 (×2): qty 1

## 2015-02-28 MED ORDER — NITROGLYCERIN 0.4 MG SL SUBL: 0.4000 mg | SUBLINGUAL_TABLET | SUBLINGUAL | Status: DC | PRN

## 2015-02-28 MED ORDER — PANTOPRAZOLE SODIUM 40 MG PO TBEC
40.0000 mg | DELAYED_RELEASE_TABLET | Freq: Every day | ORAL | Status: DC
  Administered 2015-03-01: 40 mg via ORAL
  Filled 2015-02-28: qty 1

## 2015-02-28 MED ORDER — LORAZEPAM 0.5 MG PO TABS: 0.5000 mg | ORAL_TABLET | Freq: Two times a day (BID) | ORAL | Status: DC | PRN

## 2015-02-28 MED ORDER — SODIUM CHLORIDE 0.9 % IV SOLN
INTRAVENOUS | Status: DC
  Administered 2015-02-28 – 2015-03-01 (×2): via INTRAVENOUS

## 2015-02-28 MED ORDER — TOLNAFTATE 1 % EX CREA
TOPICAL_CREAM | Freq: Two times a day (BID) | CUTANEOUS | Status: DC
  Administered 2015-02-28: 1 via TOPICAL
  Administered 2015-03-01: 10:00:00 via TOPICAL
  Filled 2015-02-28: qty 30

## 2015-02-28 MED ORDER — SODIUM CHLORIDE 0.9 % IV BOLUS (SEPSIS)
600.0000 mL | Freq: Once | INTRAVENOUS | Status: AC
  Administered 2015-02-28: 600 mL via INTRAVENOUS

## 2015-02-28 MED ORDER — ALBUTEROL SULFATE (2.5 MG/3ML) 0.083% IN NEBU
2.5000 mg | INHALATION_SOLUTION | Freq: Four times a day (QID) | RESPIRATORY_TRACT | Status: DC
  Administered 2015-02-28 – 2015-03-01 (×4): 2.5 mg via RESPIRATORY_TRACT
  Filled 2015-02-28 (×4): qty 3

## 2015-02-28 MED ORDER — SENNOSIDES-DOCUSATE SODIUM 8.6-50 MG PO TABS
2.0000 | ORAL_TABLET | Freq: Two times a day (BID) | ORAL | Status: DC
  Administered 2015-02-28 – 2015-03-01 (×2): 2 via ORAL
  Filled 2015-02-28 (×2): qty 2

## 2015-02-28 MED ORDER — ENOXAPARIN SODIUM 40 MG/0.4ML ~~LOC~~ SOLN
40.0000 mg | SUBCUTANEOUS | Status: DC
  Administered 2015-02-28 – 2015-03-01 (×2): 40 mg via SUBCUTANEOUS
  Filled 2015-02-28 (×3): qty 0.4

## 2015-02-28 MED ORDER — SODIUM CHLORIDE 0.9 % IJ SOLN
3.0000 mL | Freq: Two times a day (BID) | INTRAMUSCULAR | Status: DC
  Administered 2015-02-28 – 2015-03-01 (×2): 3 mL via INTRAVENOUS

## 2015-02-28 MED ORDER — ASPIRIN EC 81 MG PO TBEC
81.0000 mg | DELAYED_RELEASE_TABLET | Freq: Every evening | ORAL | Status: DC
  Administered 2015-02-28: 81 mg via ORAL
  Filled 2015-02-28: qty 1

## 2015-02-28 NOTE — ED Notes (Signed)
Food tray provided to him at this time  Pt observed with no unusual behavior  Appropriate to stimulation  No verbalized needs or concerns at this time  NAD assessed  Continue to monitor

## 2015-02-28 NOTE — ED Notes (Signed)
Pt to radiology.

## 2015-02-28 NOTE — H&P (Signed)
[facility] NAME: Charles Ewing    MR#:  [id number]  DATE OF BIRTH:  07/21/1940  DATE OF ADMISSION:  02/28/2015  PRIMARY CARE PHYSICIAN: Marden Noble, MD   REQUESTING/REFERRING PHYSICIAN: Dr. Dineen Kid  CHIEF COMPLAINT:   Chief Complaint  Patient presents with  . Weakness  . Fall    HISTORY OF PRESENT ILLNESS:  Charles Ewing  is a 75 y.o. male with a known history of COPD, hypertension, acid reflux, CAD, hyperlipidemia. Over a period of time he's been dizzy he's been falling down and tripping. He is recently been treated for thrush in the mouth. When he exerts himself he gets an upset stomach and dizziness. His been taking ibuprofen. He had a fall yesterday in front of his refrigerator no loss of consciousness. Family states that his legs just give out. Today when EMS came his blood pressure was low in the 80s. In the ER he was found to be hyponatremic. Hospitalist services were contacted for further evaluation.  PAST MEDICAL HISTORY:   Past Medical History  Diagnosis Date  . Hypertension   . COPD (chronic obstructive pulmonary disease) ([place])   . GERD (gastroesophageal reflux disease)   . Anxiety   . Thrush   . Hyperlipidemia   . Coronary artery disease     PAST SURGICAL HISTORY:   Past Surgical History  Procedure Laterality Date  . Skin cancer excision    . Colon polyp surgery    . Transurethral resection of prostate      SOCIAL HISTORY:   Social History  Substance Use Topics  . Smoking status: Current Every Day Smoker -- 0.50 packs/day  . Smokeless tobacco: Not on file  . Alcohol Use: No    FAMILY HISTORY:   Family History  Problem Relation Age of Onset  . Kidney cancer Mother     DRUG ALLERGIES:  No Known Allergies  REVIEW OF SYSTEMS:  CONSTITUTIONAL: No fever. Positive for fatigue and leg weakness. Weight gain 40 pounds in the past year EYES: No blurred or double vision. Wears reading  glasses EARS, NOSE, AND THROAT: No tinnitus or ear pain. Positive for runny nose. Positive for sore throat. Recent dysphagia. RESPIRATORY: No cough, shortness of breath, wheezing or hemoptysis.  CARDIOVASCULAR: Positive for indigestion and chest pain, positive for edema.  GASTROINTESTINAL: Positive for nausea. No vomiting, diarrhea or abdominal pain. No blood in bowel movements. Occasional diarrhea GENITOURINARY: No dysuria, hematuria.  ENDOCRINE: No polyuria, nocturia,  HEMATOLOGY: No anemia, easy bruising or bleeding SKIN: No rash or lesion. MUSCULOSKELETAL: No joint pain or arthritis.  Occasional neck pain and cramping NEUROLOGIC: No tingling, numbness.  PSYCHIATRY: No anxiety or depression.   MEDICATIONS AT HOME:   Prior to Admission medications   Medication Sig Start Date End Date Taking? Authorizing Provider  aspirin EC 81 MG tablet Take 81 mg by mouth every evening.   Yes Historical Provider, MD  esomeprazole (NEXIUM) 40 MG capsule Take 40 mg by mouth daily.   Yes Historical Provider, MD  fluconazole (DIFLUCAN) 200 MG tablet Take 200 mg by mouth daily. 02/16/15 03/02/15 Yes Historical Provider, MD  FLUoxetine (PROZAC) 20 MG capsule Take 20 mg by mouth 2 (two) times daily.   Yes Historical Provider, MD  levofloxacin (LEVAQUIN) 750 MG tablet Take 750 mg by mouth daily. 02/16/15 03/02/15 Yes Historical Provider, MD  lisinopril-hydrochlorothiazide (PRINZIDE,ZESTORETIC) 20-25 MG tablet Take 1 tablet by mouth daily.   Yes Historical Provider,  MD  loratadine (CLARITIN) 10 MG tablet Take 10 mg by mouth every evening.   Yes Historical Provider, MD  LORazepam (ATIVAN) 0.5 MG tablet Take 0.5 mg by mouth every 12 (twelve) hours as needed for anxiety.   Yes Historical Provider, MD  metoprolol succinate (TOPROL-XL) 50 MG 24 hr tablet Take 50 mg by mouth daily.   Yes Historical Provider, MD  nitroGLYCERIN (NITROSTAT) 0.4 MG SL tablet Place 0.4 mg under the tongue every 5 (five) minutes as needed for  chest pain.   Yes Historical Provider, MD  senna-docusate (SENOKOT-S) 8.6-50 MG tablet Take 2 tablets by mouth 2 (two) times daily.   Yes Historical Provider, MD  simvastatin (ZOCOR) 20 MG tablet Take 20 mg by mouth at bedtime.   Yes Historical Provider, MD      VITAL SIGNS:  Blood pressure 115/71, pulse 68, temperature 97.7 F (36.5 C), temperature source Oral, resp. rate 14, height 5\' 7"  (1.702 m), weight 110.678 kg (244 lb), SpO2 97 %.  PHYSICAL EXAMINATION:  GENERAL:  75 y.o.-year-old patient lying in the bed with no acute distress.  EYES: Pupils equal, round, reactive to light and accommodation. No scleral icterus. Extraocular muscles intact.  HEENT: Head atraumatic, normocephalic. Oropharynx and nasopharynx clear. Left tympanic membrane with a hole in the membrane with recent ear tube. Erythema surrounding. Right tympanic membrane erythematous at the base.  NECK:  Supple, no jugular venous distention. No thyroid enlargement, no tenderness.  LUNGS: Decreased breath sounds bilaterally, no wheezing, rales,rhonchi or crepitation. No use of accessory muscles of respiration.  CARDIOVASCULAR: S1, S2 normal. No murmurs, rubs, or gallops.  ABDOMEN: Soft, nontender, nondistended. Bowel sounds present. No organomegaly or mass.  EXTREMITIES: 2+ edema, no cyanosis, or clubbing.  NEUROLOGIC: Cranial nerves II through XII are intact. Muscle strength 5/5 in all extremities. Sensation intact. Gait not checked.  PSYCHIATRIC: The patient is alert and oriented x 3.  SKIN: Scaling and erythema at the bottom of his feet   LABORATORY PANEL:   CBC  Recent Labs Lab 02/28/15 1207  WBC 10.6  HGB 14.6  HCT 44.0  PLT 168   ------------------------------------------------------------------------------------------------------------------  Chemistries   Recent Labs Lab 02/28/15 1207  NA 128*  K 3.9  CL 95*  CO2 26  GLUCOSE 126*  BUN 26*  CREATININE 1.28*  CALCIUM 8.2*  AST 21  ALT 34   ALKPHOS 33*  BILITOT 1.1   ------------------------------------------------------------------------------------------------------------------  Cardiac Enzymes  Recent Labs Lab 02/28/15 1207  TROPONINI <0.03   ------------------------------------------------------------------------------------------------------------------  RADIOLOGY:  Dg Chest 2 View  02/28/2015  CLINICAL DATA:  Vertigo for 2 weeks, shortness breath, cough EXAM: CHEST  2 VIEW COMPARISON:  10/15/2013 FINDINGS: The heart size and mediastinal contours are within normal limits. Both lungs are clear. The visualized skeletal structures are unremarkable. IMPRESSION: No active cardiopulmonary disease. Electronically Signed   By: Kathreen Devoid   On: 02/28/2015 13:06   Ct Head Wo Contrast  02/28/2015  CLINICAL DATA:  Vertigo, fall this morning EXAM: CT HEAD WITHOUT CONTRAST TECHNIQUE: Contiguous axial images were obtained from the base of the skull through the vertex without intravenous contrast. COMPARISON:  10/15/2013 FINDINGS: No skull fracture is noted. No intracranial hemorrhage, mass effect or midline shift. Stable cerebral atrophy. Stable periventricular chronic white matter disease. No acute cortical infarction. No mass lesion is noted on this unenhanced scan. Ventricular size is stable from prior exam. Paranasal sinuses are unremarkable. There is partial opacification of right mastoid air cells. IMPRESSION: No acute intracranial abnormality.  Stable atrophy and chronic white matter disease. Partial opacification of right mastoid air cells. No acute cortical infarction. Electronically Signed   By: Lahoma Crocker M.D.   On: 02/28/2015 13:13    EKG:   Sinus arrhythmia 62 bpm  IMPRESSION AND PLAN:   1. Hypotension. I will hold antihypertensive medications and give gentle IV fluids. 2. Hyponatremia. Stop hydrochlorothiazide this is likely the culprit. Also with the recent thrush is probably not eating very well. 3. Recent thrush  infection finish up fluconazole 4. Otitis media- finish up Levaquin 5. Weakness- likely with hypotension and hyponatremia. Physical therapy evaluation. Social worker evaluation 6. Tobacco abuse smoking cessation counseling done 3 minutes by me and nicotine patch applied 7. COPD- will use albuterol nebulizer 8 anxiety on Xanax 9. Gastroesophageal reflux disease- on PPI. DC ibuprofen 10. Fungal infection on feet Lac-hydrin lotion and antifungal cream.  All the records are reviewed and case discussed with ED provider. Management plans discussed with the patient, family and they are in agreement.  CODE STATUS: full code  TOTAL TIME TAKING CARE OF THIS PATIENT: 55 minutes.    Loletha Grayer M.D on 02/28/2015 at 2:52 PM  Between 7am to 6pm - Pager - [phone]  After 6pm call admission pager [facility]  Office  [phone]  CC: Primary care physician; Marden Noble, MD

## 2015-02-28 NOTE — ED Notes (Signed)
Pt here via [facility]  Pt experienced a fall this am and he was able to pull himself up by the refrigerator door and call his brother for help  pt denies that he hit his head   "I have fallen more in the last two weeks than I have in my whole life."   EMS reports Afib and hypotension   History of COPD - treAted by [place]

## 2015-02-28 NOTE — ED Notes (Signed)
Admitting hospitalist is currently in assessing the pt

## 2015-02-28 NOTE — ED Provider Notes (Signed)
[facility] Emergency Department Provider Note  ____________________________________________  Time seen: Approximately 1230 PM  I have reviewed the triage vital signs and the nursing notes.   HISTORY  Chief Complaint Weakness and Fall    HPI JASIRE SANDFORD is a 75 y.o. male with a history of hypertension who is presenting today with multiple episodes of dizziness and near syncope. He says that these episodes have been ongoing for the past several years. However, over the past 2 weeks he has had multiple episodes and several falls. He denies hitting his head and has no pain at this time. He says that the episodes occur when he becomes dizzy and then his vision started to fade and he feels that he will fall. He denies losing consciousness at any point during the past 2 weeks. Denies any focal weakness but says he feels weak throughout. Denies any pressure or ringing in his ears. Denies any nausea or vomiting. Denies any chest pain or shortness of breath. Says that he feels no chest pain or palpitations prior to the episodes.   No past medical history on file.  There are no active problems to display for this patient.   No past surgical history on file.  No current outpatient prescriptions on file.  Allergies Review of patient's allergies indicates not on file.  No family history on file.  Social History Social History  Substance Use Topics  . Smoking status: Current Every Day Smoker  . Smokeless tobacco: Not on file  . Alcohol Use: No    Review of Systems Constitutional: No fever/chills Eyes: No visual changes. ENT: No sore throat. Cardiovascular: Denies chest pain. Respiratory: Denies shortness of breath. Gastrointestinal: No abdominal pain.  No nausea, no vomiting.  No diarrhea.  No constipation. Genitourinary: Negative for dysuria. Musculoskeletal: Negative for back pain. Skin: Negative for rash. Neurological: Negative for headaches, focal  weakness or numbness.  10-point ROS otherwise negative.  ____________________________________________   PHYSICAL EXAM:  VITAL SIGNS: ED Triage Vitals  Enc Vitals Group     BP 02/28/15 1200 89/65 mmHg     Pulse Rate 02/28/15 1200 63     Resp 02/28/15 1200 17     Temp 02/28/15 1200 97.7 F (36.5 C)     Temp Source 02/28/15 1200 Oral     SpO2 02/28/15 1200 97 %     Weight 02/28/15 1200 244 lb (110.678 kg)     Height 02/28/15 1200 5\' 7"  (1.702 m)     Head Cir --      Peak Flow --      Pain Score 02/28/15 1203 0     Pain Loc --      Pain Edu? --      Excl. in [place]? --     Constitutional: Alert and oriented. Well appearing and in no acute distress. Eyes: Conjunctivae are normal. PERRL. EOMI. Head: Atraumatic. Nose: No congestion/rhinnorhea. Mouth/Throat: Mucous membranes are moist.  Oropharynx non-erythematous. Neck: No stridor.   Cardiovascular: Irregular rhythm, normal rate. Grossly normal heart sounds.  Good peripheral circulation. Respiratory: Normal respiratory effort.  No retractions. Lungs CTAB. Gastrointestinal: Soft and nontender. No distention. No abdominal bruits. No CVA tenderness. Musculoskeletal: No lower extremity tenderness nor edema.  No joint effusions. Neurologic:  Normal speech and language. No gross focal neurologic deficits are appreciated. No gait instability. Skin:  Skin is warm, dry and intact. No rash noted. Psychiatric: Mood and affect are normal. Speech and behavior are normal.  ____________________________________________  LABS (all labs ordered are listed, but only abnormal results are displayed)  Labs Reviewed  CBC WITH DIFFERENTIAL/PLATELET - Abnormal; Notable for the following:    RDW 16.0 (*)    Neutro Abs 7.0 (*)    All other components within normal limits  COMPREHENSIVE METABOLIC PANEL - Abnormal; Notable for the following:    Sodium 128 (*)    Chloride 95 (*)    Glucose, Bld 126 (*)    BUN 26 (*)    Creatinine, Ser 1.28 (*)     Calcium 8.2 (*)    Total Protein 6.0 (*)    Albumin 3.2 (*)    Alkaline Phosphatase 33 (*)    GFR calc non Af Amer 53 (*)    All other components within normal limits  TROPONIN I  URINALYSIS COMPLETEWITH MICROSCOPIC ([facility] ONLY)   ____________________________________________  EKG  ED ECG REPORT I, Doran Stabler, the attending physician, personally viewed and interpreted this ECG.   Date: 02/28/2015  EKG Time: 1153  Rate: 62  Rhythm: Sinus arrhythmia  Axis: Normal axis  Intervals:none  ST&T Change: No ST segment elevation or depression. No abnormal T-wave inversion.  ____________________________________________  RADIOLOGY  No acute intracranial abnormality. Chest x-ray without any acute cardiopulmonary disease. ____________________________________________   PROCEDURES    ____________________________________________   INITIAL IMPRESSION / ASSESSMENT AND PLAN / ED COURSE  Pertinent labs & imaging results that were available during my care of the patient were reviewed by me and considered in my medical decision making (see chart for details).  ----------------------------------------- 1:47 PM on 02/28/2015 -----------------------------------------  Patient found to have low sodium of 128. Pressure is now in the mid 0000000 systolic. We'll admit patient for weakness as well as hyponatremia. Also near syncope with history of cardiomyopathy is concerning for arrhythmia especially with sinus arrhythmia which is new from previous. Patient and family were counseled as to the testing results as well as the plan for admission. The patient is a [facility] does have secondary insurance. Signed out to Dr.Weiting.   ____________________________________________   FINAL CLINICAL IMPRESSION(S) / ED DIAGNOSES  Hyponatremia. Dehydration. Near-syncope.    Orbie Pyo, MD 02/28/15 [phone]

## 2015-03-01 LAB — CBC
HEMATOCRIT: 40.6 % (ref 40.0–52.0)
Hemoglobin: 13.7 g/dL (ref 13.0–18.0)
MCH: 30.9 pg (ref 26.0–34.0)
MCHC: 33.7 g/dL (ref 32.0–36.0)
MCV: 91.9 fL (ref 80.0–100.0)
PLATELETS: 146 10*3/uL — AB (ref 150–440)
RBC: 4.42 MIL/uL (ref 4.40–5.90)
RDW: 16 % — AB (ref 11.5–14.5)
WBC: 8 10*3/uL (ref 3.8–10.6)

## 2015-03-01 LAB — URINALYSIS COMPLETE WITH MICROSCOPIC (ARMC ONLY)
BILIRUBIN URINE: NEGATIVE
Bacteria, UA: NONE SEEN
GLUCOSE, UA: NEGATIVE mg/dL
HGB URINE DIPSTICK: NEGATIVE
Ketones, ur: NEGATIVE mg/dL
Leukocytes, UA: NEGATIVE
NITRITE: NEGATIVE
Protein, ur: NEGATIVE mg/dL
SPECIFIC GRAVITY, URINE: 1.013 (ref 1.005–1.030)
pH: 6 (ref 5.0–8.0)

## 2015-03-01 LAB — BASIC METABOLIC PANEL
Anion gap: 6 (ref 5–15)
BUN: 24 mg/dL — AB (ref 6–20)
CO2: 27 mmol/L (ref 22–32)
CREATININE: 1.22 mg/dL (ref 0.61–1.24)
Calcium: 8.4 mg/dL — ABNORMAL LOW (ref 8.9–10.3)
Chloride: 98 mmol/L — ABNORMAL LOW (ref 101–111)
GFR calc Af Amer: >60 mL/min (ref >60)
GFR calc non Af Amer: 56 mL/min — ABNORMAL LOW (ref >60)
GLUCOSE: 134 mg/dL — AB (ref 65–99)
POTASSIUM: 3.6 mmol/L (ref 3.5–5.1)
SODIUM: 131 mmol/L — AB (ref 135–145)

## 2015-03-01 MED ORDER — SODIUM CHLORIDE 0.9 % IV BOLUS (SEPSIS)
500.0000 mL | Freq: Once | INTRAVENOUS | Status: AC
  Administered 2015-03-01: 500 mL via INTRAVENOUS

## 2015-03-01 NOTE — Evaluation (Signed)
Physical Therapy Evaluation Patient Details Name: Charles Ewing MRN: [id number] DOB: 15-Jan-1941 Today's Date: 03/01/2015   History of Present Illness  Pt is a 75 y.o. male presenting with weakness and multiple falls in past 2 weeks.  PMH includes COPD, htn, CAD.  Clinical Impression  Prior to admission, pt was independent without AD.  Pt lives alone in 1 level home with 2 steps to enter with B railings.  Currently pt is CGA with transfers and ambulation 10 feet (UE support on IV pole); pt refused to walk further d/t c/o not feeling well after eating--pt reports having this issue recently (pt's BP 101/64 laying in bed end of session; no c/o dizziness during session although nursing reporting pt orthostatic earlier today and pt reporting dizziness earlier today).  Pt reports having 10 falls in past 2 weeks and multiple falls prior to that (pt's legs "give out" and he goes straight down).  Educated pt on need to trial RW next session and anticipate this will assist with preventing falls at home.  Pt would benefit from skilled PT to address noted impairments and functional limitations.  Recommend pt discharge to home with support of family (anticipate will need RW) with HHPT when medically appropriate.     Follow Up Recommendations Home health PT    Equipment Recommendations   (Anticipate pt will benefit from RW)    Recommendations for Other Services       Precautions / Restrictions Precautions Precautions: Fall Precaution Comments: Orthostatic Restrictions Weight Bearing Restrictions: No      Mobility  Bed Mobility Overal bed mobility: Independent             General bed mobility comments: Sit to supine  Transfers Overall transfer level: Needs assistance Equipment used: None Transfers: Sit to/from Stand Sit to Stand: Min guard         General transfer comment: strong stand without loss of balance  Ambulation/Gait Ambulation/Gait assistance: Min guard Ambulation  Distance (Feet): 10 Feet Assistive device:  (R UE support on IV pole)       General Gait Details: decreased B step length/foot clearance/heelstrike; limited distance d/t pt not feeling well; no c/o dizziness  Stairs            Wheelchair Mobility    Modified Rankin (Stroke Patients Only)       Balance Overall balance assessment: Needs assistance Sitting-balance support: No upper extremity supported;Feet supported Sitting balance-Leahy Scale: Normal     Standing balance support: Single extremity supported (on IV pole) Standing balance-Leahy Scale: Fair                               Pertinent Vitals/Pain Pain Assessment: No/denies pain  See flow sheet for details.    Home Living Family/patient expects to be discharged to:: Private residence Living Arrangements: Alone Available Help at Discharge: Family   Home Access: Stairs to enter Entrance Stairs-Rails: Psychiatric nurse of Steps: 2 Home Layout: One level Home Equipment: None      Prior Function Level of Independence: Independent         Comments: Pt reports 10 falls in past 2 weeks and h/o falls prior to that as well ("legs just give out")     Hand Dominance        Extremity/Trunk Assessment   Upper Extremity Assessment: Overall WFL for tasks assessed           Lower Extremity Assessment:  Overall [facility] for tasks assessed      Cervical / Trunk Assessment: Normal  Communication   Communication: No difficulties  Cognition Arousal/Alertness: Awake/alert Behavior During Therapy: WFL for tasks assessed/performed Overall Cognitive Status: Within Functional Limits for tasks assessed                      General Comments   Nursing cleared pt for participation in physical therapy.  Pt agreeable to PT session.  Pt's son present during session.    Exercises        Assessment/Plan    PT Assessment Patient needs continued PT services  PT Diagnosis  Difficulty walking   PT Problem List Decreased balance;Decreased mobility;Decreased knowledge of use of DME;Decreased knowledge of precautions  PT Treatment Interventions DME instruction;Gait training;Stair training;Functional mobility training;Therapeutic activities;Therapeutic exercise;Balance training;Patient/family education   PT Goals (Current goals can be found in the Care Plan section) Acute Rehab PT Goals Patient Stated Goal: to go home PT Goal Formulation: With patient Time For Goal Achievement: 03/15/15 Potential to Achieve Goals: Good    Frequency Min 2X/week   Barriers to discharge        Co-evaluation               End of Session Equipment Utilized During Treatment: Gait belt Activity Tolerance:  (Limited session d/t pt reporting not feeling well after eating (has been having this recently)) Patient left: in bed;with call bell/phone within reach;with bed alarm set;with family/visitor present Nurse Communication: Mobility status;Precautions (Vitals)         Time: [id number] PT Time Calculation (min) (ACUTE ONLY): 20 min   Charges:   PT Evaluation $PT Eval Moderate Complexity: 1 Procedure     PT G CodesRaquel Sarna Darcus Edds 2015/03/28, 1:30 PM Leitha Bleak, [place]

## 2015-03-01 NOTE — Progress Notes (Signed)
Discharge instructions given with verbalized understanding.  VSS. Medication that were stored in pharmacy was returned to patient.   Patient denies pain prior to discharge.  Patient taken to visitors entrance via wheelchair to be taken home in personal vehicle by brother.

## 2015-03-01 NOTE — Care Management Note (Signed)
Case Management Note  Patient Details  Name: Charles Ewing MRN: [id number] Date of Birth: Jul 31, 1940   Subjective/Objective:              Patient came in with hypotension and history of falls.  Patient live at home alone.  He is a brother who lives 30 minutes away, and an adult son who lives locally.  Patient states that he does any equipment at home, and still drives himself.  Brother at bedside and expresses concerns of the patient falling.  PT has recommended home health PT.  Order for home health PT, RN and RW has been placed.  Walker delivered room.  Patient provided list of home health agencies.  Patient does not have a preference on agency.  Referral made to [facility].  Corene Cornea notified of referral.  RNCM signing off   Action/Plan:   Expected Discharge Date:                  Expected Discharge Plan:     In-House Referral:     Discharge planning Services     Post Acute Care Choice:    Choice offered to:     DME Arranged:    DME Agency:     HH Arranged:    [place] Agency:     Status of Service:     Medicare Important Message Given:    Date Medicare IM Given:    Medicare IM give by:    Date Additional Medicare IM Given:    Additional Medicare Important Message give by:     If discussed at [place] of Stay Meetings, dates discussed:    Additional Comments:  Beverly Sessions, RN 03/01/2015, 3:38 PM

## 2015-03-01 NOTE — Discharge Instructions (Addendum)
°  DIET:  Cardiac diet  DISCHARGE CONDITION:  Stable  ACTIVITY:  Activity as tolerated  OXYGEN:  Home Oxygen: No.   Oxygen Delivery: room air  DISCHARGE LOCATION:  Home with home health   If you experience worsening of your admission symptoms, develop shortness of breath, life threatening emergency, suicidal or homicidal thoughts you must seek medical attention immediately by calling 911 or calling your MD immediately  if symptoms less severe.  You Must read complete instructions/literature along with all the possible adverse reactions/side effects for all the Medicines you take and that have been prescribed to you. Take any new Medicines after you have completely understood and accpet all the possible adverse reactions/side effects.   Please note  You were cared for by a hospitalist during your hospital stay. If you have any questions about your discharge medications or the care you received while you were in the hospital after you are discharged, you can call the unit and asked to speak with the hospitalist on call if the hospitalist that took care of you is not available. Once you are discharged, your primary care physician will handle any further medical issues. Please note that NO REFILLS for any discharge medications will be authorized once you are discharged, as it is imperative that you return to your primary care physician (or establish a relationship with a primary care physician if you do not have one) for your aftercare needs so that they can reassess your need for medications and monitor your lab values.   Stop taking Lisinopril - Hydrochlorothiazide.

## 2015-03-02 NOTE — Discharge Summary (Signed)
[place] [facility] [place] NAME: Charles Ewing    MR#:  [id number]  DATE OF BIRTH:  Mar 22, 1940  DATE OF ADMISSION:  02/28/2015 ADMITTING PHYSICIAN: Loletha Grayer, MD  DATE OF DISCHARGE: 03/01/2015  4:07 PM  PRIMARY CARE PHYSICIAN: Marden Noble, MD    ADMISSION DIAGNOSIS:  Dehydration [E86.0] Hyponatremia [E87.1] Weakness [R53.1] Near syncope [R55]  DISCHARGE DIAGNOSIS:  Active Problems:   Hypotension   SECONDARY DIAGNOSIS:   Past Medical History  Diagnosis Date  . Hypertension   . COPD (chronic obstructive pulmonary disease) ([place])   . GERD (gastroesophageal reflux disease)   . Anxiety   . Thrush   . Hyperlipidemia   . Coronary artery disease      ADMITTING HISTORY  Charles Ewing is a 75 y.o. male with a known history of COPD, hypertension, acid reflux, CAD, hyperlipidemia. Over a period of time he's been dizzy he's been falling down and tripping. He is recently been treated for thrush in the mouth. When he exerts himself he gets an upset stomach and dizziness. His been taking ibuprofen. He had a fall yesterday in front of his refrigerator no loss of consciousness. Family states that his legs just give out. Today when EMS came his blood pressure was low in the 80s. In the ER he was found to be hyponatremic. Hospitalist services were contacted for further evaluation.   HOSPITAL COURSE:   1. Hypotension. Patient has been on hydrochlorothiazide and not able to keep up with adequate hydration. Patient was given IV fluids and his hypotension resolved. His lisinopril and hydrochlorothiazide have been discontinued. I will continue his metoprolol. Advised to keep himself well-hydrated. 2. Hyponatremia. Stop hydrochlorothiazide this is likely the culprit. Also contemplated by dehydration. This improved with IV fluids. 3. Recent thrush infection finish up fluconazole. Last dose on day of discharge 4. Otitis media- finished  Levaquin 5. Weakness- likely with hypotension and hyponatremia. Physical therapy evaluation. Social worker evaluation 6. Tobacco abuse smoking cessation counseling done 3 minutes by me and nicotine patch applied 7. COPD- will use albuterol nebulizer 8 anxiety on Xanax 9. Gastroesophageal reflux disease- on PPI. DC ibuprofen  Prior to discharge patient's orthostatics were checked. Patient did not feel dizzy from laying to standing position. Ambulated with physical therapy and home health with physical therapy advice which has been set up. Also prescription for a walker given. Discussed with brother at bedside extensively and answered all questions.  CONSULTS OBTAINED:     DRUG ALLERGIES:  No Known Allergies  DISCHARGE MEDICATIONS:   Discharge Medication List as of 03/01/2015  3:30 PM    CONTINUE these medications which have NOT CHANGED   Details  aspirin EC 81 MG tablet Take 81 mg by mouth every evening., Until Discontinued, Historical Med    esomeprazole (NEXIUM) 40 MG capsule Take 40 mg by mouth daily., Until Discontinued, Historical Med    fluconazole (DIFLUCAN) 200 MG tablet Take 200 mg by mouth daily., Starting 02/16/2015, Until Thu 03/02/15, Historical Med    FLUoxetine (PROZAC) 20 MG capsule Take 20 mg by mouth 2 (two) times daily., Until Discontinued, Historical Med    loratadine (CLARITIN) 10 MG tablet Take 10 mg by mouth every evening., Until Discontinued, Historical Med    LORazepam (ATIVAN) 0.5 MG tablet Take 0.5 mg by mouth every 12 (twelve) hours as needed for anxiety., Until Discontinued, Historical Med    metoprolol succinate (TOPROL-XL) 50 MG 24 hr tablet Take 50 mg by  mouth daily., Until Discontinued, Historical Med    nitroGLYCERIN (NITROSTAT) 0.4 MG SL tablet Place 0.4 mg under the tongue every 5 (five) minutes as needed for chest pain., Until Discontinued, Historical Med    senna-docusate (SENOKOT-S) 8.6-50 MG tablet Take 2 tablets by mouth 2 (two) times daily.,  Until Discontinued, Historical Med    simvastatin (ZOCOR) 20 MG tablet Take 20 mg by mouth at bedtime., Until Discontinued, Historical Med      STOP taking these medications     levofloxacin (LEVAQUIN) 750 MG tablet      lisinopril-hydrochlorothiazide (PRINZIDE,ZESTORETIC) 20-25 MG tablet          Today    VITAL SIGNS:  Blood pressure 106/72, pulse 57, temperature 97.4 F (36.3 C), temperature source Oral, resp. rate 19, height 5\' 11"  (1.803 m), weight 103.148 kg (227 lb 6.4 oz), SpO2 100 %.  I/O:  No intake or output data in the 24 hours ending 03/02/15 1802  PHYSICAL EXAMINATION:  Physical Exam  GENERAL:  75 y.o.-year-old patient lying in the bed with no acute distress.  LUNGS: Normal breath sounds bilaterally, no wheezing, rales,rhonchi or crepitation. No use of accessory muscles of respiration.  CARDIOVASCULAR: S1, S2 normal. No murmurs, rubs, or gallops.  ABDOMEN: Soft, non-tender, non-distended. Bowel sounds present. No organomegaly or mass.  NEUROLOGIC: Moves all 4 extremities. PSYCHIATRIC: The patient is alert and oriented x 3.  SKIN: No obvious rash, lesion, or ulcer.   DATA REVIEW:   CBC  Recent Labs Lab 03/01/15 0438  WBC 8.0  HGB 13.7  HCT 40.6  PLT 146*    Chemistries   Recent Labs Lab 02/28/15 1207 03/01/15 0438  NA 128* 131*  K 3.9 3.6  CL 95* 98*  CO2 26 27  GLUCOSE 126* 134*  BUN 26* 24*  CREATININE 1.28* 1.22  CALCIUM 8.2* 8.4*  AST 21  --   ALT 34  --   ALKPHOS 33*  --   BILITOT 1.1  --     Cardiac Enzymes  Recent Labs Lab 02/28/15 1207  TROPONINI <0.03    Microbiology Results  Results for orders placed or performed in visit on 03/29/11  Urine culture     Status: None   Collection Time: 03/30/11  4:24 AM  Result Value Ref Range Status   Micro Text Report   Final       SOURCE: INDWELLING CATH    ORGANISM 1                >100,000 CFU/ML MORGANELLA MORGANII SSP MORGANII   ORGANISM 2                -   COMMENT                    WITH MIXED BACTERIAL ORGANISMS   ANTIBIOTIC                    ORG#1     AMPICILLIN                    R         CEFAZOLIN                     R         CEFTRIAXONE                   S         CIPROFLOXACIN  S         GENTAMICIN                    S         IMIPENEM                      S         LEVOFLOXACIN                  S         NITROFURANTOIN                R         TIGECYCLINE                   R         CEFOXITIN                     I         TRIMETHOPRIM/SULFAMETHOXAZOLE S             RADIOLOGY:  No results found.    Follow up with PCP in 1 week.  Management plans discussed with the patient, family and they are in agreement.  CODE STATUS:  Code Status History    Date Active Date Inactive Code Status Order ID Comments User Context   02/28/2015  2:38 PM 03/01/2015  7:08 PM Full Code [id number]  Loletha Grayer, MD ED      TOTAL TIME TAKING CARE OF THIS PATIENT ON DAY OF DISCHARGE: more than 30 minutes.    Hillary Bow R M.D on 03/02/2015 at 6:02 PM  Between 7am to 6pm - Pager - [phone]  After 6pm go to [email or web address] - password [email or web address] [facility]  Office  [phone]  CC: Primary care physician; Marden Noble, MD     Note: This dictation was prepared with Dragon dictation along with smaller phrase technology. Any transcriptional errors that result from this process are unintentional.

## 2015-05-15 ENCOUNTER — Encounter: Payer: Self-pay | Admitting: Family Medicine

## 2015-05-15 ENCOUNTER — Ambulatory Visit (INDEPENDENT_AMBULATORY_CARE_PROVIDER_SITE_OTHER): Payer: Medicare Other | Admitting: Family Medicine

## 2015-05-15 VITALS — BP 114/80 | HR 76 | Temp 97.9°F | Resp 16 | Ht 71.0 in | Wt 234.0 lb

## 2015-05-15 DIAGNOSIS — E785 Hyperlipidemia, unspecified: Secondary | ICD-10-CM

## 2015-05-15 DIAGNOSIS — Z7189 Other specified counseling: Secondary | ICD-10-CM | POA: Diagnosis not present

## 2015-05-15 DIAGNOSIS — R972 Elevated prostate specific antigen [PSA]: Secondary | ICD-10-CM | POA: Diagnosis not present

## 2015-05-15 DIAGNOSIS — E119 Type 2 diabetes mellitus without complications: Secondary | ICD-10-CM | POA: Diagnosis not present

## 2015-05-15 DIAGNOSIS — R600 Localized edema: Secondary | ICD-10-CM

## 2015-05-15 DIAGNOSIS — J449 Chronic obstructive pulmonary disease, unspecified: Secondary | ICD-10-CM | POA: Insufficient documentation

## 2015-05-15 DIAGNOSIS — I952 Hypotension due to drugs: Secondary | ICD-10-CM | POA: Diagnosis not present

## 2015-05-15 DIAGNOSIS — J432 Centrilobular emphysema: Secondary | ICD-10-CM

## 2015-05-15 DIAGNOSIS — Z7689 Persons encountering health services in other specified circumstances: Secondary | ICD-10-CM

## 2015-05-15 DIAGNOSIS — R7303 Prediabetes: Secondary | ICD-10-CM | POA: Insufficient documentation

## 2015-05-15 MED ORDER — ALBUTEROL SULFATE HFA 108 (90 BASE) MCG/ACT IN AERS: 2.0000 | INHALATION_SPRAY | Freq: Four times a day (QID) | RESPIRATORY_TRACT | Status: DC | PRN

## 2015-05-15 MED ORDER — ONETOUCH ULTRA SYSTEM W/DEVICE KIT: 1.0000 | PACK | Freq: Once | Status: DC

## 2015-05-15 MED ORDER — ONETOUCH ULTRASOFT LANCETS MISC: Status: DC

## 2015-05-15 MED ORDER — FUROSEMIDE 20 MG PO TABS: 20.0000 mg | ORAL_TABLET | Freq: Every day | ORAL | Status: DC

## 2015-05-15 MED ORDER — GLUCOSE BLOOD VI STRP: ORAL_STRIP | Status: DC

## 2015-05-15 NOTE — Assessment & Plan Note (Signed)
Recheck HgA1c to confirm diabetes. Consider metformin.

## 2015-05-15 NOTE — Assessment & Plan Note (Signed)
Appears controlled at this time. Rescue inhaler renewed. Consider spirometry next visit.

## 2015-05-15 NOTE — Progress Notes (Signed)
Subjective:    Patient ID: Charles Ewing, male    DOB: 17-Sep-1940, 76 y.o.   MRN: [id number]  HPI: Charles Ewing is a 75 y.o. male presenting on 05/15/2015 for Establish Care   HPI  Pt presents to establish care today. Previous care provider was Dr. Brynda Greathouse.  It has been 6 months since His last PCP visit. Records from previous provider will be requested and reviewed. Current medical problems include:  Hypertension- Very long time.  having issues keeping BP stable- recently hospitalized with hypotension. Was hospitalized in January for hyponatremia and hypotension. Feels dizzy when he stands up. Feels unstable on his feet.  Pedal edema: Takes lasix daily- to help with swollen.  BPH: Placed on Flomax after the hospital. Started in Jan. Doesn't wake up to void. Urine stream is not as strong. Goes more often with flomax. Takes at 8am. PSA of 8.8%- very high.  Diabetes vs Prediabetes: 8.0% on 02/15/15. Home health RN told him it was 6.4%- [facility] on 04/02/15. Anxiety: Very long time. Treated by psychiatry. [place]. No agent orange exposure to his knowledge.  GERD- no alarm symptoms. Take omeprazole daily. Avoids diet triggers. COPD- diagnosed several years ago. Unsure last spirometry. Has spriva at home. No rescue inhaler. Denies CP, SOB, no cough with productive sputum.   Health maintenance:  Smoker- smokes 1/2 pack per day. 60 years smoker.    Past Medical History  Diagnosis Date  . Hypertension   . COPD (chronic obstructive pulmonary disease) ([place])   . GERD (gastroesophageal reflux disease)   . Anxiety   . Thrush   . Hyperlipidemia   . Coronary artery disease   . Arthritis   . Colon polyp   . Depression    Social History   Social History  . Marital Status: Divorced    Spouse Name: N/A  . Number of Children: N/A  . Years of Education: N/A   Occupational History  . Not on file.   Social History Main Topics  . Smoking status: Current Every Day Smoker -- 0.50 packs/day  .  Smokeless tobacco: Not on file  . Alcohol Use: No  . Drug Use: No  . Sexual Activity: Not on file   Other Topics Concern  . Not on file   Social History Narrative   Family History  Problem Relation Age of Onset  . Kidney cancer Mother    Current Outpatient Prescriptions on File Prior to Visit  Medication Sig  . aspirin EC 81 MG tablet Take 81 mg by mouth every evening.  Marland Kitchen esomeprazole (NEXIUM) 40 MG capsule Take 40 mg by mouth daily.  Marland Kitchen FLUoxetine (PROZAC) 20 MG capsule Take 20 mg by mouth 2 (two) times daily.  Marland Kitchen loratadine (CLARITIN) 10 MG tablet Take 10 mg by mouth every evening.  Marland Kitchen LORazepam (ATIVAN) 0.5 MG tablet Take 0.5 mg by mouth every 12 (twelve) hours as needed for anxiety.  . metoprolol succinate (TOPROL-XL) 50 MG 24 hr tablet Take 50 mg by mouth daily.  . nitroGLYCERIN (NITROSTAT) 0.4 MG SL tablet Place 0.4 mg under the tongue every 5 (five) minutes as needed for chest pain.  Marland Kitchen senna-docusate (SENOKOT-S) 8.6-50 MG tablet Take 2 tablets by mouth 2 (two) times daily. Reported on 05/15/2015  . simvastatin (ZOCOR) 20 MG tablet Take 20 mg by mouth at bedtime.   No current facility-administered medications on file prior to visit.    Review of Systems  Constitutional: Negative for fever and chills.  HENT:  Negative.   Respiratory: Negative for chest tightness, shortness of breath and wheezing.   Cardiovascular: Negative for chest pain, palpitations and leg swelling.  Gastrointestinal: Negative for nausea, vomiting and abdominal pain.  Endocrine: Negative.   Genitourinary: Negative for dysuria, urgency, discharge, penile pain and testicular pain.  Musculoskeletal: Negative for back pain, joint swelling and arthralgias.  Skin: Negative.   Neurological: Positive for dizziness. Negative for syncope, weakness, numbness and headaches.  Psychiatric/Behavioral: Negative for sleep disturbance and dysphoric mood.   Per HPI unless specifically indicated above     Objective:      BP 114/80 mmHg  Pulse 76  Temp(Src) 97.9 F (36.6 C) (Oral)  Resp 16  Ht '5\' 11"'  (1.803 m)  Wt 234 lb (106.142 kg)  BMI 32.65 kg/m2  Wt Readings from Last 3 Encounters:  05/15/15 234 lb (106.142 kg)  02/28/15 227 lb 6.4 oz (103.148 kg)    Physical Exam  Constitutional: He is oriented to person, place, and time. He appears well-developed and well-nourished. No distress.  HENT:  Head: Normocephalic and atraumatic.  Neck: Neck supple. No thyromegaly present.  Cardiovascular: Normal rate, regular rhythm and normal heart sounds.  Exam reveals no gallop and no friction rub.   No murmur heard. Pulmonary/Chest: Effort normal and breath sounds normal. He has no wheezes.  Abdominal: Soft. Bowel sounds are normal. He exhibits no distension. There is no tenderness. There is no rebound.  Musculoskeletal: Normal range of motion. He exhibits no edema or tenderness.  Neurological: He is alert and oriented to person, place, and time. He has normal reflexes.  Skin: Skin is warm and dry. No rash noted. No erythema.  Psychiatric: He has a normal mood and affect. His behavior is normal. Thought content normal.   Results for orders placed or performed during the hospital encounter of 02/28/15  CBC with Differential  Result Value Ref Range   WBC 10.6 3.8 - 10.6 K/uL   RBC 4.76 4.40 - 5.90 MIL/uL   Hemoglobin 14.6 13.0 - 18.0 g/dL   HCT 44.0 40.0 - 52.0 %   MCV 92.4 80.0 - 100.0 fL   MCH 30.6 26.0 - 34.0 pg   MCHC 33.1 32.0 - 36.0 g/dL   RDW 16.0 (H) 11.5 - 14.5 %   Platelets 168 150 - 440 K/uL   Neutrophils Relative % 66 %   Neutro Abs 7.0 (H) 1.4 - 6.5 K/uL   Lymphocytes Relative 25 %   Lymphs Abs 2.6 1.0 - 3.6 K/uL   Monocytes Relative 8 %   Monocytes Absolute 0.8 0.2 - 1.0 K/uL   Eosinophils Relative 1 %   Eosinophils Absolute 0.1 0 - 0.7 K/uL   Basophils Relative 0 %   Basophils Absolute 0.0 0 - 0.1 K/uL  Comprehensive metabolic panel  Result Value Ref Range   Sodium 128 (L) 135 - 145  mmol/L   Potassium 3.9 3.5 - 5.1 mmol/L   Chloride 95 (L) 101 - 111 mmol/L   CO2 26 22 - 32 mmol/L   Glucose, Bld 126 (H) 65 - 99 mg/dL   BUN 26 (H) 6 - 20 mg/dL   Creatinine, Ser 1.28 (H) 0.61 - 1.24 mg/dL   Calcium 8.2 (L) 8.9 - 10.3 mg/dL   Total Protein 6.0 (L) 6.5 - 8.1 g/dL   Albumin 3.2 (L) 3.5 - 5.0 g/dL   AST 21 15 - 41 U/L   ALT 34 17 - 63 U/L   Alkaline Phosphatase 33 (L) 38 - 126  U/L   Total Bilirubin 1.1 0.3 - 1.2 mg/dL   GFR calc non Af Amer 53 (L) >60 mL/min   GFR calc Af Amer >60 >60 mL/min   Anion gap 7 5 - 15  Troponin I  Result Value Ref Range   Troponin I <0.03 <0.031 ng/mL  Urinalysis complete, with microscopic ([facility] only)  Result Value Ref Range   Color, Urine YELLOW (A) YELLOW   APPearance CLEAR (A) CLEAR   Glucose, UA NEGATIVE NEGATIVE mg/dL   Bilirubin Urine NEGATIVE NEGATIVE   Ketones, ur NEGATIVE NEGATIVE mg/dL   Specific Gravity, Urine 1.013 1.005 - 1.030   Hgb urine dipstick NEGATIVE NEGATIVE   pH 6.0 5.0 - 8.0   Protein, ur NEGATIVE NEGATIVE mg/dL   Nitrite NEGATIVE NEGATIVE   Leukocytes, UA NEGATIVE NEGATIVE   RBC / HPF 0-5 0 - 5 RBC/hpf   WBC, UA 0-5 0 - 5 WBC/hpf   Bacteria, UA NONE SEEN NONE SEEN   Squamous Epithelial / LPF 0-5 (A) NONE SEEN  Basic metabolic panel  Result Value Ref Range   Sodium 131 (L) 135 - 145 mmol/L   Potassium 3.6 3.5 - 5.1 mmol/L   Chloride 98 (L) 101 - 111 mmol/L   CO2 27 22 - 32 mmol/L   Glucose, Bld 134 (H) 65 - 99 mg/dL   BUN 24 (H) 6 - 20 mg/dL   Creatinine, Ser 1.22 0.61 - 1.24 mg/dL   Calcium 8.4 (L) 8.9 - 10.3 mg/dL   GFR calc non Af Amer 56 (L) >60 mL/min   GFR calc Af Amer >60 >60 mL/min   Anion gap 6 5 - 15  CBC  Result Value Ref Range   WBC 8.0 3.8 - 10.6 K/uL   RBC 4.42 4.40 - 5.90 MIL/uL   Hemoglobin 13.7 13.0 - 18.0 g/dL   HCT 40.6 40.0 - 52.0 %   MCV 91.9 80.0 - 100.0 fL   MCH 30.9 26.0 - 34.0 pg   MCHC 33.7 32.0 - 36.0 g/dL   RDW 16.0 (H) 11.5 - 14.5 %   Platelets 146 (L) 150 - 440  K/uL      Assessment & Plan:   Problem List Items Addressed This Visit      Cardiovascular and Mediastinum   Hypotension - Primary    Take Flomax at night to help orthostatic hypotension. 1/2 dose of lasix to help with dizziness. Recheck CMET.  Recheck 1 mos.       Relevant Medications   furosemide (LASIX) 20 MG tablet   Other Relevant Orders   Comprehensive metabolic panel     Respiratory   COPD (chronic obstructive pulmonary disease) ([place])    Appears controlled at this time. Rescue inhaler renewed. Consider spirometry next visit.       Relevant Medications   albuterol (PROVENTIL HFA;VENTOLIN HFA) 108 (90 Base) MCG/ACT inhaler   Other Relevant Orders   CBC with Differential/Platelet     Endocrine   Type 2 diabetes mellitus ([place])    Recheck HgA1c to confirm diabetes. Consider metformin.       Relevant Medications   Blood Glucose Monitoring Suppl (ONE TOUCH ULTRA SYSTEM KIT) w/Device KIT   glucose blood test strip   Lancets (ONETOUCH ULTRASOFT) lancets   Other Relevant Orders   Hemoglobin A1c     Other   Elevated prostate specific antigen (PSA)    Last PSA 8.8% in January. Will repeat and consider urology referral.       Relevant Orders  PSA   Hyperlipidemia    Recheck Lipid panel.       Relevant Medications   furosemide (LASIX) 20 MG tablet   Pedal edema    Trace on exam today. 1/2 lasix today to help with dizziness.      Relevant Medications   furosemide (LASIX) 20 MG tablet      Meds ordered this encounter  Medications  . DISCONTD: furosemide (LASIX) 40 MG tablet    Sig:   . nystatin (MYCOSTATIN) 100000 UNIT/ML suspension    Sig:   . potassium chloride SA (K-DUR,KLOR-CON) 20 MEQ tablet    Sig:   . tamsulosin (FLOMAX) 0.4 MG CAPS capsule    Sig:   . furosemide (LASIX) 20 MG tablet    Sig: Take 1 tablet (20 mg total) by mouth daily.    Dispense:  30 tablet    Refill:  5    Order Specific Question:  Supervising Provider    Answer:  Arlis Porta [phone]  . Blood Glucose Monitoring Suppl (ONE TOUCH ULTRA SYSTEM KIT) w/Device KIT    Sig: 1 kit by Does not apply route once. CHeck blood glucose in the AM three times weekly.    Dispense:  1 each    Refill:  0    Order Specific Question:  Supervising Provider    Answer:  Arlis Porta [phone]  . glucose blood test strip    Sig: Check blood glucose three times weekly.    Dispense:  100 each    Refill:  12    Order Specific Question:  Supervising Provider    Answer:  Arlis Porta [phone]  . Lancets (ONETOUCH ULTRASOFT) lancets    Sig: Use as instructed    Dispense:  100 each    Refill:  12    Order Specific Question:  Supervising Provider    Answer:  Arlis Porta [phone]  . albuterol (PROVENTIL HFA;VENTOLIN HFA) 108 (90 Base) MCG/ACT inhaler    Sig: Inhale 2 puffs into the lungs every 6 (six) hours as needed for wheezing or shortness of breath.    Dispense:  1 Inhaler    Refill:  11    Order Specific Question:  Supervising Provider    Answer:  Arlis Porta [phone]      Follow up plan: Return in about 4 weeks (around 06/12/2015) for Hypotension. Marland Kitchen

## 2015-05-15 NOTE — Assessment & Plan Note (Signed)
Take Flomax at night to help orthostatic hypotension. 1/2 dose of lasix to help with dizziness. Recheck CMET.  Recheck 1 mos.

## 2015-05-15 NOTE — Patient Instructions (Signed)
Low blood pressure: Take your Flomax at night. Cut your lasix in 1/2. Check blood pressure carefully. Do not change positions quickly- stand up slowly. Sit up on the side of the bed before you stand.   Your goal blood pressure is >100/60 <150/90. Work on low salt/sodium diet - goal <1.5gm (1,500mg ) per day. Eat a diet high in fruits/vegetables and whole grains.  Look into mediterranean and DASH diet. Goal activity is 169min/wk of moderate intensity exercise.  This can be split into 30 minute chunks.  If you are not at this level, you can start with smaller 10-15 min increments and slowly build up activity. Look at [place].org for more resources  We will recheck some labwork to determine if you need to see a specialist and what your A1c is.

## 2015-05-15 NOTE — Assessment & Plan Note (Signed)
Last PSA 8.8% in January. Will repeat and consider urology referral.

## 2015-05-15 NOTE — Assessment & Plan Note (Signed)
Recheck Lipid panel.

## 2015-05-15 NOTE — Assessment & Plan Note (Signed)
Trace on exam today. 1/2 lasix today to help with dizziness.

## 2015-05-20 LAB — COMPREHENSIVE METABOLIC PANEL
A/G RATIO: 1.6 (ref 1.2–2.2)
ALT: 20 IU/L (ref 0–44)
AST: 19 IU/L (ref 0–40)
Albumin: 3.6 g/dL (ref 3.5–4.8)
Alkaline Phosphatase: 55 IU/L (ref 39–117)
BILIRUBIN TOTAL: 0.3 mg/dL (ref 0.0–1.2)
BUN/Creatinine Ratio: 11 (ref 10–24)
BUN: 11 mg/dL (ref 8–27)
CALCIUM: 8.7 mg/dL (ref 8.6–10.2)
CHLORIDE: 101 mmol/L (ref 96–106)
CO2: 23 mmol/L (ref 18–29)
Creatinine, Ser: 1.01 mg/dL (ref 0.76–1.27)
GFR calc Af Amer: 84 mL/min/{1.73_m2} (ref >59)
GFR calc non Af Amer: 72 mL/min/{1.73_m2} (ref >59)
GLUCOSE: 135 mg/dL — AB (ref 65–99)
Globulin, Total: 2.2 g/dL (ref 1.5–4.5)
POTASSIUM: 4.4 mmol/L (ref 3.5–5.2)
Sodium: 138 mmol/L (ref 134–144)
Total Protein: 5.8 g/dL — ABNORMAL LOW (ref 6.0–8.5)

## 2015-05-20 LAB — CBC WITH DIFFERENTIAL/PLATELET
BASOS ABS: 0 10*3/uL (ref 0.0–0.2)
Basos: 0 %
EOS (ABSOLUTE): 0.1 10*3/uL (ref 0.0–0.4)
Eos: 2 %
HEMOGLOBIN: 14.6 g/dL (ref 12.6–17.7)
Hematocrit: 44.5 % (ref 37.5–51.0)
Immature Grans (Abs): 0 10*3/uL (ref 0.0–0.1)
Immature Granulocytes: 1 %
LYMPHS ABS: 2.2 10*3/uL (ref 0.7–3.1)
Lymphs: 32 %
MCH: 30.5 pg (ref 26.6–33.0)
MCHC: 32.8 g/dL (ref 31.5–35.7)
MCV: 93 fL (ref 79–97)
MONOS ABS: 0.4 10*3/uL (ref 0.1–0.9)
Monocytes: 6 %
NEUTROS ABS: 4.2 10*3/uL (ref 1.4–7.0)
Neutrophils: 59 %
PLATELETS: 231 10*3/uL (ref 150–379)
RBC: 4.79 x10E6/uL (ref 4.14–5.80)
RDW: 15.6 % — ABNORMAL HIGH (ref 12.3–15.4)
WBC: 7 10*3/uL (ref 3.4–10.8)

## 2015-05-20 LAB — HEMOGLOBIN A1C
Est. average glucose Bld gHb Est-mCnc: 131 mg/dL
HEMOGLOBIN A1C: 6.2 % — AB (ref 4.8–5.6)

## 2015-05-20 LAB — PSA: Prostate Specific Ag, Serum: 7.9 ng/mL — ABNORMAL HIGH (ref 0.0–4.0)

## 2015-05-22 ENCOUNTER — Other Ambulatory Visit: Payer: Self-pay | Admitting: Family Medicine

## 2015-05-22 DIAGNOSIS — R972 Elevated prostate specific antigen [PSA]: Secondary | ICD-10-CM

## 2015-05-22 DIAGNOSIS — N138 Other obstructive and reflux uropathy: Secondary | ICD-10-CM

## 2015-05-22 DIAGNOSIS — N401 Enlarged prostate with lower urinary tract symptoms: Secondary | ICD-10-CM

## 2015-06-12 ENCOUNTER — Encounter: Payer: Self-pay | Admitting: Family Medicine

## 2015-06-12 ENCOUNTER — Ambulatory Visit (INDEPENDENT_AMBULATORY_CARE_PROVIDER_SITE_OTHER): Payer: Medicare Other | Admitting: Family Medicine

## 2015-06-12 VITALS — BP 124/64 | HR 71 | Temp 97.6°F | Resp 16 | Ht 71.0 in | Wt 238.0 lb

## 2015-06-12 DIAGNOSIS — I951 Orthostatic hypotension: Secondary | ICD-10-CM

## 2015-06-12 DIAGNOSIS — E119 Type 2 diabetes mellitus without complications: Secondary | ICD-10-CM

## 2015-06-12 DIAGNOSIS — I714 Abdominal aortic aneurysm, without rupture, unspecified: Secondary | ICD-10-CM

## 2015-06-12 DIAGNOSIS — L600 Ingrowing nail: Secondary | ICD-10-CM | POA: Diagnosis not present

## 2015-06-12 DIAGNOSIS — R600 Localized edema: Secondary | ICD-10-CM

## 2015-06-12 MED ORDER — MEDICAL COMPRESSION STOCKINGS MISC: 1.0000 | Freq: Every day | Status: DC

## 2015-06-12 NOTE — Progress Notes (Signed)
Subjective:    Patient ID: Charles Ewing, male    DOB: May 13, 1940, 75 y.o.   MRN: [id number]  HPI: Charles Ewing is a 75 y.o. male presenting on 06/12/2015 for Hypotension   HPI  Pt presents presents for follow-up of hypotension and leg swelling. When he takes low dose of lasix no dizziness, however the feet are swelling. Does not elevate feet when sitting. Does not have compression socks.  Not checking BP at home.  Ingrown toe nail- would like to see a podiatrist.   Also reporting a AAA was found at the [place]. Has not been imaged in several years. Found on ultrasound. Has never had a CT scan.   Past Medical History  Diagnosis Date  . Hypertension   . COPD (chronic obstructive pulmonary disease) ([place])   . GERD (gastroesophageal reflux disease)   . Anxiety   . Thrush   . Hyperlipidemia   . Coronary artery disease   . Arthritis   . Colon polyp   . Depression   . 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 Marital Status: Divorced    Spouse Name: N/A  . Number of Children: N/A  . Years of Education: N/A   Occupational History  . Not on file.   Social History Main Topics  . Smoking status: Current Every Day Smoker -- 0.50 packs/day  . Smokeless tobacco: Not on file  . Alcohol Use: No  . Drug Use: No  . Sexual Activity: Not on file   Other Topics Concern  . Not on file   Social History Narrative   Family History  Problem Relation Age of Onset  . Kidney cancer Mother    Current Outpatient Prescriptions on File Prior to Visit  Medication Sig  . albuterol (PROVENTIL HFA;VENTOLIN HFA) 108 (90 Base) MCG/ACT inhaler Inhale 2 puffs into the lungs every 6 (six) hours as needed for wheezing or shortness of breath.  Marland Kitchen aspirin EC 81 MG tablet Take 81 mg by mouth every evening.  . Blood Glucose Monitoring Suppl (ONE TOUCH ULTRA SYSTEM KIT) w/Device KIT 1 kit by Does not apply route once. CHeck blood glucose in the AM three  times weekly.  Marland Kitchen esomeprazole (NEXIUM) 40 MG capsule Take 40 mg by mouth daily.  Marland Kitchen FLUoxetine (PROZAC) 20 MG capsule Take 20 mg by mouth 2 (two) times daily.  . furosemide (LASIX) 20 MG tablet Take 1 tablet (20 mg total) by mouth daily.  Marland Kitchen glucose blood test strip Check blood glucose three times weekly.  . Lancets (ONETOUCH ULTRASOFT) lancets Use as instructed  . loratadine (CLARITIN) 10 MG tablet Take 10 mg by mouth every evening.  Marland Kitchen LORazepam (ATIVAN) 0.5 MG tablet Take 0.5 mg by mouth every 12 (twelve) hours as needed for anxiety.  . metoprolol succinate (TOPROL-XL) 50 MG 24 hr tablet Take 50 mg by mouth daily.  . nitroGLYCERIN (NITROSTAT) 0.4 MG SL tablet Place 0.4 mg under the tongue every 5 (five) minutes as needed for chest pain.  Marland Kitchen nystatin (MYCOSTATIN) 100000 UNIT/ML suspension   . potassium chloride SA (K-DUR,KLOR-CON) 20 MEQ tablet   . senna-docusate (SENOKOT-S) 8.6-50 MG tablet Take 2 tablets by mouth 2 (two) times daily. Reported on 05/15/2015  . simvastatin (ZOCOR) 20 MG tablet Take 20 mg by mouth at bedtime.  . tamsulosin (FLOMAX) 0.4 MG CAPS capsule    No current facility-administered medications on file prior to  visit.    Review of Systems  Constitutional: Negative for fever and chills.  HENT: Negative.   Respiratory: Negative for chest tightness, shortness of breath and wheezing.   Cardiovascular: Positive for leg swelling. Negative for chest pain and palpitations.  Gastrointestinal: Negative for nausea, vomiting and abdominal pain.  Endocrine: Negative.   Genitourinary: Negative for dysuria, urgency, discharge, penile pain and testicular pain.  Musculoskeletal: Negative for back pain, joint swelling and arthralgias.  Skin: Negative.        Ingrown toe nail  Neurological: Negative for dizziness, weakness, numbness and headaches.  Psychiatric/Behavioral: Negative for sleep disturbance and dysphoric mood.   Per HPI unless specifically indicated above     Objective:      BP 124/64 mmHg  Pulse 71  Temp(Src) 97.6 F (36.4 C) (Oral)  Resp 16  Ht _0  (1.803 m)  Wt 238 lb (107.956 kg)  BMI 33.21 kg/m2  Wt Readings from Last 3 Encounters:  06/12/15 238 lb (107.956 kg)  05/15/15 234 lb (106.142 kg)  02/28/15 227 lb 6.4 oz (103.148 kg)    Physical Exam  Constitutional: He is oriented to person, place, and time. He appears well-developed and well-nourished. No distress.  HENT:  Head: Normocephalic and atraumatic.  Neck: Neck supple. No thyromegaly present.  Cardiovascular: Normal rate, regular rhythm and normal heart sounds.  Exam reveals no gallop and no friction rub.   No murmur heard. Pulmonary/Chest: Effort normal and breath sounds normal. He has no wheezes.  Abdominal: Soft. Bowel sounds are normal. He exhibits no distension. There is no tenderness. There is no rebound.  Musculoskeletal: Normal range of motion. He exhibits no edema or tenderness.  +2 pitting edema bilateral feet. +1 to mid shin bilateral lower extremity.   Neurological: He is alert and oriented to person, place, and time. He has normal reflexes.  Skin: Skin is warm and dry. No rash noted. There is erythema (outside R great toe. ).  Rigid yellowing nails bilateral feet. Ingrown toe nail R great toe- mild erythema with no swelling on the outside of the nail. No s/s of infection.   Psychiatric: He has a normal mood and affect. His behavior is normal. Thought content normal.   Results for orders placed or performed in visit on 05/15/15  Comprehensive metabolic panel  Result Value Ref Range   Glucose 135 (H) 65 - 99 mg/dL   BUN 11 8 - 27 mg/dL   Creatinine, Ser 1.01 0.76 - 1.27 mg/dL   GFR calc non Af Amer 72 >59 mL/min/1.73   GFR calc Af Amer 84 >59 mL/min/1.73   BUN/Creatinine Ratio 11 10 - 24   Sodium 138 134 - 144 mmol/L   Potassium 4.4 3.5 - 5.2 mmol/L   Chloride 101 96 - 106 mmol/L   CO2 23 18 - 29 mmol/L   Calcium 8.7 8.6 - 10.2 mg/dL   Total Protein 5.8 (L) 6.0 - 8.5  g/dL   Albumin 3.6 3.5 - 4.8 g/dL   Globulin, Total 2.2 1.5 - 4.5 g/dL   Albumin/Globulin Ratio 1.6 1.2 - 2.2   Bilirubin Total 0.3 0.0 - 1.2 mg/dL   Alkaline Phosphatase 55 39 - 117 IU/L   AST 19 0 - 40 IU/L   ALT 20 0 - 44 IU/L  PSA  Result Value Ref Range   Prostate Specific Ag, Serum 7.9 (H) 0.0 - 4.0 ng/mL  Hemoglobin A1c  Result Value Ref Range   Hgb A1c MFr Bld 6.2 (H) 4.8 -  5.6 %   Est. average glucose Bld gHb Est-mCnc 131 mg/dL  CBC with Differential/Platelet  Result Value Ref Range   WBC 7.0 3.4 - 10.8 x10E3/uL   RBC 4.79 4.14 - 5.80 x10E6/uL   Hemoglobin 14.6 12.6 - 17.7 g/dL   Hematocrit 44.5 37.5 - 51.0 %   MCV 93 79 - 97 fL   MCH 30.5 26.6 - 33.0 pg   MCHC 32.8 31.5 - 35.7 g/dL   RDW 15.6 (H) 12.3 - 15.4 %   Platelets 231 150 - 379 x10E3/uL   Neutrophils 59 %   Lymphs 32 %   Monocytes 6 %   Eos 2 %   Basos 0 %   Neutrophils Absolute 4.2 1.4 - 7.0 x10E3/uL   Lymphocytes Absolute 2.2 0.7 - 3.1 x10E3/uL   Monocytes Absolute 0.4 0.1 - 0.9 x10E3/uL   EOS (ABSOLUTE) 0.1 0.0 - 0.4 x10E3/uL   Basophils Absolute 0.0 0.0 - 0.2 x10E3/uL   Immature Granulocytes 1 %   Immature Grans (Abs) 0.0 0.0 - 0.1 x10E3/uL      Assessment & Plan:   Problem List Items Addressed This Visit      Cardiovascular and Mediastinum   Orthostatic hypotension - Primary   Abdominal aortic aneurysm (AAA) ([facility])   Relevant Orders   US ABDOMINAL AORTA SCREENING AAA     Endocrine   Type 2 diabetes mellitus ([place])   Relevant Orders   Ambulatory referral to Ophthalmology     Other   Pedal edema   Relevant Medications   Elastic Bandages & Supports (MEDICAL COMPRESSION STOCKINGS) MISC    Other Visit Diagnoses    Ingrown toenail without infection        Relevant Orders    Ambulatory referral to Podiatry       Meds ordered this encounter  Medications  . Elastic Bandages & Supports (MEDICAL COMPRESSION STOCKINGS) MISC    Sig: 1 each by Does not apply route daily.    Dispense:  1  each    Refill:  0    Order Specific Question:  Supervising Provider    Answer:  Arlis Porta [[id number]]      Follow up plan: Return in about 4 years (around 06/12/2019) for foot swelling. Marland Kitchen

## 2015-06-12 NOTE — Patient Instructions (Addendum)
Feet swelling: Elevated your feet when you are sitting down. Walk 10 minutes after each meal. Get a pair of compression stocking up to your knees to help with swelling. Wear these when awake during the day.   We will get a picture of the aneurysm in your belly to determine if you need to see a vascular surgeon.

## 2015-06-13 ENCOUNTER — Telehealth: Payer: Self-pay | Admitting: Family Medicine

## 2015-06-13 NOTE — Telephone Encounter (Signed)
Called [facility] -- as per Amy she didn't decrease his Furosamide to 10 mg he is on 20 mg and gave verbal for nursing visits.

## 2015-06-13 NOTE — Telephone Encounter (Signed)
Charles Ewing with [facility] needs to confirm a med change.  Pt said the furosemide was decreased to 10 mg.  Also it's time for a recert to continue nurse services.  Please advise [phone]

## 2015-06-14 ENCOUNTER — Encounter: Payer: Self-pay | Admitting: *Deleted

## 2015-06-19 ENCOUNTER — Encounter: Payer: Self-pay | Admitting: Urology

## 2015-06-19 ENCOUNTER — Ambulatory Visit (INDEPENDENT_AMBULATORY_CARE_PROVIDER_SITE_OTHER): Payer: Medicare Other | Admitting: Urology

## 2015-06-19 VITALS — BP 153/74 | HR 66 | Ht 71.0 in | Wt 236.0 lb

## 2015-06-19 DIAGNOSIS — R350 Frequency of micturition: Secondary | ICD-10-CM

## 2015-06-19 LAB — URINALYSIS, COMPLETE
BILIRUBIN UA: NEGATIVE
Glucose, UA: NEGATIVE
Ketones, UA: NEGATIVE
NITRITE UA: NEGATIVE
PH UA: 5.5 (ref 5.0–7.5)
Protein, UA: NEGATIVE
Specific Gravity, UA: 1.02 (ref 1.005–1.030)
UUROB: 0.2 mg/dL (ref 0.2–1.0)

## 2015-06-19 LAB — MICROSCOPIC EXAMINATION: EPITHELIAL CELLS (NON RENAL): NONE SEEN /HPF (ref 0–10)

## 2015-06-19 LAB — BLADDER SCAN AMB NON-IMAGING

## 2015-06-19 NOTE — Progress Notes (Signed)
06/19/2015 3:00 PM   DMONI FORTSON 10-17-1940 [id number]  Referring provider: Luciana Axe, NP [place], [place] [address]  Chief Complaint  Patient presents with  . New Patient (Initial Visit)    elevated PSA    HPI: 75 year old male who is referred for further evaluation of an elevated PSA.  The patient's most recent PSA was 7.9. Based on the patient's records there is report of the patient having a PSA of 8.8 in January 2017. There are no additional reports her lab results available. The patient is a [place] patient. He tells me that he has had 3 negative prostate biopsies in the past. 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 He underwent a TURP in 2010 by Dr. Bernardo Heater. Since the patient's bladder outlet procedure he denies any voiding symptoms. Today, the patient denies any urgency, frequency, hematuria or progressive voiding symptoms over the last 6 months. The patient has no family history of prostate cancer. He has no history of recurrent urinary tract infections or urinary retention.   PMH: Past Medical History  Diagnosis Date  . Hypertension   . COPD (chronic obstructive pulmonary disease) ([place])   . GERD (gastroesophageal reflux disease)   . Anxiety   . Thrush   . Hyperlipidemia   . Coronary artery disease   . Arthritis   . Colon polyp   . Depression   . Abdominal aortic aneurysm (AAA) ([place]) 2013    found at [facility]-  . Arrhythmia   . Skin cancer     Surgical History: Past Surgical History  Procedure Laterality Date  . Skin cancer excision    . Colon polyp surgery    . Transurethral resection of prostate  2010    Home Medications:    Medication List       This list is accurate as of: 06/19/15  3:00 PM.  Always use your most recent med list.               albuterol 108 (90 Base) MCG/ACT inhaler  Commonly known as:  PROVENTIL HFA;VENTOLIN HFA  Inhale 2 puffs into the lungs every 6 (six) hours as needed for wheezing or  shortness of breath.     aspirin EC 81 MG tablet  Take 81 mg by mouth every evening.     esomeprazole 40 MG capsule  Commonly known as:  NEXIUM  Take 40 mg by mouth daily.     FLUoxetine 20 MG capsule  Commonly known as:  PROZAC  Take 20 mg by mouth 2 (two) times daily.     furosemide 20 MG tablet  Commonly known as:  LASIX  Take 1 tablet (20 mg total) by mouth daily.     glucose blood test strip  Check blood glucose three times weekly.     loratadine 10 MG tablet  Commonly known as:  CLARITIN  Take 10 mg by mouth every evening.     LORazepam 0.5 MG tablet  Commonly known as:  ATIVAN  Take 0.5 mg by mouth every 12 (twelve) hours as needed for anxiety.     Medical Compression Stockings Misc  1 each by Does not apply route daily.     metoprolol succinate 50 MG 24 hr tablet  Commonly known as:  TOPROL-XL  Take 50 mg by mouth daily.     nitroGLYCERIN 0.4 MG SL tablet  Commonly known as:  NITROSTAT  Place 0.4 mg under the tongue every 5 (  five) minutes as needed for chest pain.     nystatin 100000 UNIT/ML suspension  Commonly known as:  MYCOSTATIN     ONE TOUCH ULTRA SYSTEM KIT w/Device Kit  1 kit by Does not apply route once. CHeck blood glucose in the AM three times weekly.     onetouch ultrasoft lancets  Use as instructed     potassium chloride SA 20 MEQ tablet  Commonly known as:  K-DUR,KLOR-CON     senna-docusate 8.6-50 MG tablet  Commonly known as:  Senokot-S  Take 2 tablets by mouth 2 (two) times daily. Reported on 05/15/2015     simvastatin 20 MG tablet  Commonly known as:  ZOCOR  Take 20 mg by mouth at bedtime.     tamsulosin 0.4 MG Caps capsule  Commonly known as:  FLOMAX        Allergies: No Known Allergies  Family History: Family History  Problem Relation Age of Onset  . Kidney cancer Mother   . Prostate cancer Neg Hx   . Bladder Cancer Neg Hx     Social History:  reports that he has been smoking.  He does not have any smokeless tobacco  history on file. He reports that he does not drink alcohol or use illicit drugs.  ROS: UROLOGY Frequent Urination?: Yes Hard to postpone urination?: Yes Burning/pain with urination?: No Get up at night to urinate?: No Leakage of urine?: No Urine stream starts and stops?: Yes Trouble starting stream?: Yes Do you have to strain to urinate?: Yes Blood in urine?: No Urinary tract infection?: No Sexually transmitted disease?: No Injury to kidneys or bladder?: No Painful intercourse?: No Weak stream?: No Erection problems?: Yes Penile pain?: No  Gastrointestinal Nausea?: No Vomiting?: No Indigestion/heartburn?: Yes Diarrhea?: No Constipation?: Yes  Constitutional Fever: No Night sweats?: No Weight loss?: No Fatigue?: No  Skin Skin rash/lesions?: No Itching?: Yes  Eyes Blurred vision?: No Double vision?: No  Ears/Nose/Throat Sore throat?: Yes Sinus problems?: No  Hematologic/Lymphatic Swollen glands?: No Easy bruising?: Yes  Cardiovascular Leg swelling?: Yes Chest pain?: Yes  Respiratory Cough?: Yes Shortness of breath?: Yes  Endocrine Excessive thirst?: No  Musculoskeletal Back pain?: Yes Joint pain?: Yes  Neurological Headaches?: No Dizziness?: Yes     Physical Exam: BP 153/74 mmHg  Pulse 66  Ht _0  (1.803 m)  Wt 236 lb (107.049 kg)  BMI 32.93 kg/m2  Constitutional:  Alert and oriented, No acute distress. HEENT: [place] AT, moist mucus membranes.  Trachea midline, no masses. Cardiovascular: No clubbing, cyanosis, or edema. Respiratory: Normal respiratory effort, no increased work of breathing. GI: Abdomen is soft, nontender, nondistended, no abdominal masses GU: No CVA tenderness.  DRE: Prostate is approximate 60 g, symmetric, no nodules Skin: No rashes, bruises or suspicious lesions. Lymph: No cervical or inguinal adenopathy. Neurologic: Grossly intact, no focal deficits, moving all 4 extremities. Psychiatric: Normal mood and  affect.  Laboratory Data: Lab Results  Component Value Date   WBC 7.0 05/19/2015   HGB 13.7 03/01/2015   HCT 44.5 05/19/2015   MCV 93 05/19/2015   PLT 231 05/19/2015    Lab Results  Component Value Date   CREATININE 1.01 05/19/2015    No results found for: PSA  No results found for: TESTOSTERONE  Lab Results  Component Value Date   HGBA1C 6.2* 05/19/2015    Urinalysis    Component Value Date/Time   COLORURINE YELLOW* 03/01/2015 0235   COLORURINE Yellow 10/15/2013 0912   APPEARANCEUR CLEAR* 03/01/2015 0235  APPEARANCEUR Hazy 10/15/2013 0912   LABSPEC 1.013 03/01/2015 0235   LABSPEC 1.015 10/15/2013 0912   PHURINE 6.0 03/01/2015 0235   PHURINE 7.0 10/15/2013 0912   GLUCOSEU NEGATIVE 03/01/2015 0235   GLUCOSEU Negative 10/15/2013 0912   HGBUR NEGATIVE 03/01/2015 0235   HGBUR Negative 10/15/2013 0912   BILIRUBINUR NEGATIVE 03/01/2015 0235   BILIRUBINUR Negative 10/15/2013 0912   KETONESUR NEGATIVE 03/01/2015 0235   KETONESUR Negative 10/15/2013 0912   PROTEINUR NEGATIVE 03/01/2015 0235   PROTEINUR 30 mg/dL 10/15/2013 0912   NITRITE NEGATIVE 03/01/2015 0235   NITRITE Negative 10/15/2013 0912   LEUKOCYTESUR NEGATIVE 03/01/2015 0235   LEUKOCYTESUR Trace 10/15/2013 0912    Pertinent Imaging: none  Assesfsment & Plan:  75 year old male with numerous comorbidities who presents today for further evaluation of an elevated PSA. The patient's PSA to have trended down slightly over the course of the past 5 months from 8.8 to 7.8  urther, the patient has a long history of an elevated PSA and has had 3 negative prostate biopsies in the past. These were all performed at the [place].  I reassured the patient in regards to his elevated PSA. Ultimately, if the patient was found to have prostate cancer, it would certainly be a low-grade prostate cancer that would not be the cause of his mortality. Surgery for prostate removal is not in this patient's future. Our plan is to check the  patient's PSA in 6 months. If at that time the PSA is stable he should not have any additional PSAs.  - Urinalysis, Complete - BLADDER SCAN AMB NON-IMAGING   Return in about 6 months (around 12/20/2015).  Ardis Hughs, [facility] [address], [place], [place] [address] [phone]

## 2015-06-22 ENCOUNTER — Ambulatory Visit
Admission: RE | Admit: 2015-06-22 | Discharge: 2015-06-22 | Disposition: A | Discharge status: Home / Self Care | Payer: Medicare Other | Source: Ambulatory Visit | Attending: Family Medicine | Admitting: Family Medicine

## 2015-06-22 DIAGNOSIS — I714 Abdominal aortic aneurysm, without rupture, unspecified: Secondary | ICD-10-CM

## 2015-06-22 DIAGNOSIS — K760 Fatty (change of) liver, not elsewhere classified: Secondary | ICD-10-CM | POA: Diagnosis not present

## 2015-06-26 ENCOUNTER — Telehealth: Payer: Self-pay

## 2015-06-26 MED ORDER — TIOTROPIUM BROMIDE MONOHYDRATE 18 MCG IN CAPS: 18.0000 ug | ORAL_CAPSULE | Freq: Every day | RESPIRATORY_TRACT | Status: DC

## 2015-06-26 NOTE — Telephone Encounter (Signed)
Go ahead and send in [place]. 18 mg caps.  Inhale one cap each AM. (#30 /12 refills).

## 2015-06-26 NOTE — Telephone Encounter (Signed)
Pt has copd in Hx but spiriva is not listed on his med list had seen Amy in past and as per [place] wants this Rx send to [facility] and mentioned that was Rx by past physician can we this please suggest?

## 2015-06-26 NOTE — Telephone Encounter (Signed)
Done.[place] 

## 2015-06-28 ENCOUNTER — Encounter: Payer: Self-pay | Admitting: *Deleted

## 2015-07-03 ENCOUNTER — Encounter: Payer: Self-pay | Admitting: Podiatry

## 2015-07-03 ENCOUNTER — Ambulatory Visit (INDEPENDENT_AMBULATORY_CARE_PROVIDER_SITE_OTHER): Payer: Medicare Other | Admitting: Podiatry

## 2015-07-03 VITALS — BP 114/63 | HR 73 | Resp 16

## 2015-07-03 DIAGNOSIS — M79676 Pain in unspecified toe(s): Secondary | ICD-10-CM | POA: Diagnosis not present

## 2015-07-03 DIAGNOSIS — L600 Ingrowing nail: Secondary | ICD-10-CM | POA: Diagnosis not present

## 2015-07-03 DIAGNOSIS — B351 Tinea unguium: Secondary | ICD-10-CM

## 2015-07-03 NOTE — Progress Notes (Signed)
   Subjective:    Patient ID: Charles Ewing, male    DOB: [birth year], 75 y.o.   MRN: [id number]  HPI: He presents today with a chief complaint of sharp radial nail margins. He states that he has diabetes but he is unsure of his hemoglobin A1c.Marland Kitchen He would like to have his nails cut or removed.    Review of Systems  Constitutional: Positive for fever, chills, diaphoresis, activity change, appetite change, fatigue and unexpected weight change.  HENT: Positive for ear pain and sneezing.   Eyes: Positive for itching.  Respiratory: Positive for cough and shortness of breath.   Cardiovascular: Positive for leg swelling.  Endocrine: Positive for polyuria.  Genitourinary: Positive for frequency.  Musculoskeletal: Positive for back pain, arthralgias and gait problem.  Neurological: Positive for dizziness.  All other systems reviewed and are negative.      Objective:   Physical Exam: Vital signs are stable alert and oriented 3. Pulses are strongly palpable. Neurologic sensorium is intact. Deep tendon reflexes are intact. Muscle strength is equal bilateral. Orthopedic evaluation Mr. Tolle joints distal to the ankle for range of motion without crepitation. Toenails are thick yellow dystrophic onychomycotic sharp incurvated nail margins along the tibial border of the hallux bilaterally. No signs of bacterial infection.        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  Plan: Debridement of nails 1 through 5 bilateral covered service secondary to pain and diabetes.

## 2015-07-03 NOTE — Patient Instructions (Signed)

## 2015-07-04 ENCOUNTER — Telehealth: Payer: Self-pay | Admitting: Family Medicine

## 2015-07-04 NOTE — Telephone Encounter (Signed)
Spoke with Rip Harbour- okay to hold nexium. Has had similar pain in past that improved with holding nexium. If not improving he needs to be seen. Thanks! AK

## 2015-07-04 NOTE — Telephone Encounter (Signed)
Rip Harbour from [facility] called [phone] states that pt was complaining abdominal pain,  She  Wanted to know if pt can stop his Nexium. [facility]  Call back # [phone]

## 2015-07-18 ENCOUNTER — Encounter: Payer: Medicare Other | Admitting: Family Medicine

## 2015-07-19 LAB — HM DIABETES EYE EXAM

## 2015-07-19 NOTE — Progress Notes (Signed)
This encounter was created in error - please disregard.

## 2015-07-21 ENCOUNTER — Encounter: Payer: Self-pay | Admitting: Family Medicine

## 2015-08-22 ENCOUNTER — Telehealth: Payer: Self-pay | Admitting: Family Medicine

## 2015-08-22 MED ORDER — FLUTICASONE PROPIONATE 50 MCG/ACT NA SUSP: 2.0000 | Freq: Every day | NASAL | Status: DC

## 2015-08-22 NOTE — Telephone Encounter (Signed)
Sent to pharmacy on file 

## 2015-08-22 NOTE — Telephone Encounter (Signed)
[name] from [facility] called wanted to know if you would call some Flonase in for pt.  [facility]  Call back # is [phone]

## 2015-08-29 ENCOUNTER — Other Ambulatory Visit: Payer: Self-pay | Admitting: Family Medicine

## 2015-08-29 ENCOUNTER — Telehealth: Payer: Self-pay | Admitting: Family Medicine

## 2015-08-29 MED ORDER — POTASSIUM CHLORIDE CRYS ER 20 MEQ PO TBCR: 20.0000 meq | EXTENDED_RELEASE_TABLET | Freq: Every day | ORAL | Status: DC

## 2015-08-29 MED ORDER — NYSTATIN 100000 UNIT/ML MT SUSP: 5.0000 mL | Freq: Four times a day (QID) | OROMUCOSAL | Status: DC

## 2015-08-29 NOTE — Telephone Encounter (Signed)
Linda  Click from [facility] called needing a verbal to continue home care nursing  Vaughan Basta call back # is [phone]

## 2015-08-29 NOTE — Telephone Encounter (Signed)
Paper refill request received.

## 2015-08-30 NOTE — Telephone Encounter (Signed)
Orders given.  

## 2015-09-05 ENCOUNTER — Other Ambulatory Visit: Payer: Self-pay | Admitting: Family Medicine

## 2015-09-05 DIAGNOSIS — M25561 Pain in right knee: Secondary | ICD-10-CM

## 2015-09-12 ENCOUNTER — Telehealth: Payer: Self-pay | Admitting: Family Medicine

## 2015-09-12 MED ORDER — NYSTATIN 100000 UNIT/ML MT SUSP: 5.0000 mL | Freq: Four times a day (QID) | OROMUCOSAL | 1 refills | Status: DC

## 2015-09-12 NOTE — Telephone Encounter (Signed)
Done. It should be a larger bottle when he needs a refill Thanks! Ak

## 2015-09-12 NOTE — Telephone Encounter (Signed)
Charles Ewing with [name] asked for a new prescription of nystatin solution 360 mils sent to [facility].  He just got a refill but it was a small bottle and won't last long.  Her call back number is [phone]

## 2015-09-15 ENCOUNTER — Telehealth: Payer: Self-pay | Admitting: Family Medicine

## 2015-09-15 MED ORDER — TAMSULOSIN HCL 0.4 MG PO CAPS: 0.4000 mg | ORAL_CAPSULE | Freq: Every day | ORAL | 11 refills | Status: DC

## 2015-09-15 NOTE — Telephone Encounter (Signed)
Pharmacy requesting refill authorization for Flomax 0.4 mg.

## 2015-09-18 NOTE — Telephone Encounter (Signed)
Done

## 2015-10-09 ENCOUNTER — Other Ambulatory Visit: Payer: Self-pay | Admitting: Family Medicine

## 2015-10-25 ENCOUNTER — Telehealth: Payer: Self-pay | Admitting: Family Medicine

## 2015-10-25 NOTE — Telephone Encounter (Signed)
[name] with [facility] needs a verbal to continue nursing.  Her call back number is [phone]

## 2015-10-25 NOTE — Telephone Encounter (Signed)
Please call and give verbal in my name. I could not get to it before I left.   Thanks! AK

## 2015-10-25 NOTE — Telephone Encounter (Signed)
Gave [name] Verbal.

## 2015-11-01 ENCOUNTER — Other Ambulatory Visit: Payer: Self-pay | Admitting: Family Medicine

## 2015-11-17 ENCOUNTER — Encounter: Payer: Self-pay | Admitting: Family Medicine

## 2015-11-17 ENCOUNTER — Ambulatory Visit (INDEPENDENT_AMBULATORY_CARE_PROVIDER_SITE_OTHER): Payer: Medicare Other | Admitting: Family Medicine

## 2015-11-17 VITALS — BP 110/60 | HR 55 | Temp 98.1°F | Resp 16 | Ht 71.0 in | Wt 228.0 lb

## 2015-11-17 DIAGNOSIS — I714 Abdominal aortic aneurysm, without rupture, unspecified: Secondary | ICD-10-CM

## 2015-11-17 DIAGNOSIS — N183 Chronic kidney disease, stage 3 unspecified: Secondary | ICD-10-CM | POA: Insufficient documentation

## 2015-11-17 DIAGNOSIS — I129 Hypertensive chronic kidney disease with stage 1 through stage 4 chronic kidney disease, or unspecified chronic kidney disease: Secondary | ICD-10-CM | POA: Insufficient documentation

## 2015-11-17 DIAGNOSIS — I10 Essential (primary) hypertension: Secondary | ICD-10-CM | POA: Diagnosis not present

## 2015-11-17 DIAGNOSIS — R7303 Prediabetes: Secondary | ICD-10-CM

## 2015-11-17 DIAGNOSIS — I951 Orthostatic hypotension: Secondary | ICD-10-CM | POA: Diagnosis not present

## 2015-11-17 LAB — COMPLETE METABOLIC PANEL WITH GFR
ALBUMIN: 3.4 g/dL — AB (ref 3.6–5.1)
ALK PHOS: 47 U/L (ref 40–115)
ALT: 19 U/L (ref 9–46)
AST: 21 U/L (ref 10–35)
BILIRUBIN TOTAL: 0.4 mg/dL (ref 0.2–1.2)
BUN: 12 mg/dL (ref 7–25)
CALCIUM: 8.7 mg/dL (ref 8.6–10.3)
CO2: 27 mmol/L (ref 20–31)
CREATININE: 1.06 mg/dL (ref 0.70–1.18)
Chloride: 104 mmol/L (ref 98–110)
GFR, EST AFRICAN AMERICAN: 79 mL/min (ref >=60)
GFR, Est Non African American: 68 mL/min (ref >=60)
Glucose, Bld: 94 mg/dL (ref 65–99)
Potassium: 4.4 mmol/L (ref 3.5–5.3)
Sodium: 137 mmol/L (ref 135–146)
TOTAL PROTEIN: 5.6 g/dL — AB (ref 6.1–8.1)

## 2015-11-17 LAB — CBC WITH DIFFERENTIAL/PLATELET
BASOS PCT: 0 %
Basophils Absolute: 0 cells/uL (ref 0–200)
EOS ABS: 180 {cells}/uL (ref 15–500)
EOS PCT: 2 %
HCT: 48.5 % (ref 38.5–50.0)
Hemoglobin: 15.9 g/dL (ref 13.2–17.1)
LYMPHS PCT: 29 %
Lymphs Abs: 2610 cells/uL (ref 850–3900)
MCH: 30.4 pg (ref 27.0–33.0)
MCHC: 32.8 g/dL (ref 32.0–36.0)
MCV: 92.7 fL (ref 80.0–100.0)
MONOS PCT: 7 %
MPV: 9.8 fL (ref 7.5–12.5)
Monocytes Absolute: 630 cells/uL (ref 200–950)
Neutro Abs: 5580 cells/uL (ref 1500–7800)
Neutrophils Relative %: 62 %
PLATELETS: 209 10*3/uL (ref 140–400)
RBC: 5.23 MIL/uL (ref 4.20–5.80)
RDW: 15.4 % — AB (ref 11.0–15.0)
WBC: 9 10*3/uL (ref 3.8–10.8)

## 2015-11-17 LAB — POCT GLYCOSYLATED HEMOGLOBIN (HGB A1C): Hemoglobin A1C: 5.9

## 2015-11-17 MED ORDER — METOPROLOL SUCCINATE ER 50 MG PO TB24: 25.0000 mg | ORAL_TABLET | Freq: Every day | ORAL | 11 refills | Status: DC

## 2015-11-17 NOTE — Patient Instructions (Signed)
For your blood pressure and heart rate- cut your Metoprolol 50mg  pill in 1/2 and take only a 1/2 tablet daily.  We will check your blood pressure in 2 weeks.   STOP potassium at this time. We will recheck it in 2 weeks.

## 2015-11-17 NOTE — Assessment & Plan Note (Signed)
Pt is hypotensive today as well as bradycardic. Will plan to reduce dose of metoprolol and recheck in 2 weeks. Alarm symptoms reviewed.

## 2015-11-17 NOTE — Assessment & Plan Note (Signed)
Confirmed via ultrasound in 06/2015- recommendation was repeat US in 3 years.  Pt remains on beta blocker to help control BP.

## 2015-11-17 NOTE — Assessment & Plan Note (Signed)
BP low at ENT today. Mildly low on recheck here at PCP. Will plan to 1/2 the dose of metoprolol and recheck his BP in 2 weeks. Reviewed changing positions slowly to allow blood pressure time to compensate.  Check CMP and CBC.

## 2015-11-17 NOTE — Progress Notes (Signed)
Subjective:    Patient ID: Charles Ewing, male    DOB: 1940/06/01, 75 y.o.   MRN: [id number]  HPI: Charles Ewing is a 75 y.o. male presenting on 11/17/2015 for Hypotension   HPI  Pt presents for evaluation of hypotension. Was at the [place] this morning for ENT follow-up and BP was noted to be 85/47 HR in the 60's per his recall. These vital signs were taken at 4pm.  Was not feeling dizzy at the time. Felt a little a dizzy when he got out of the car when he changed position. Dizziness is an ongoing issue- he does have inner ear issues requiring ENT follow-up and a history of orthostatic hypotension.  Otherwise felt fine. Was given crackers a bottle of water. Felt fine. Currently taking 24m of Metoprolol daily to control BP and HR. HR is running low Prediabetes- A1c went down to 5.9% from 6.2% Has started vaping from smoking. Starting 2 weeks ago. Occasional cigarettes now- doing 1/2 pack per day down from 1 pack per day.   Past Medical History:  Diagnosis Date  . [place] 2013   [facility]  . Anxiety   . Arrhythmia   . Arthritis   . Colon polyp   . COPD (chronic obstructive pulmonary disease) ([place]   . Coronary artery disease   . Depression   . GERD (gastroesophageal reflux disease)   . Hyperlipidemia   . Hypertension   . Skin cancer   . Thrush     Current Outpatient Prescriptions on File Prior to Visit  Medication Sig  . albuterol (PROVENTIL HFA;VENTOLIN HFA) 108 (90 Base) MCG/ACT inhaler Inhale 2 puffs into the lungs every 6 (six) hours as needed for wheezing or shortness of breath.  .Marland Kitchenaspirin EC 81 MG tablet Take 81 mg by mouth every evening.  . Blood Glucose Monitoring Suppl (ONE TOUCH ULTRA SYSTEM KIT) w/Device KIT 1 kit by Does not apply route once. CHeck blood glucose in the AM three times weekly.  .Regino SchultzeBandages & Supports (MEDICAL COMPRESSION STOCKINGS) MISC 1 each by Does not apply route daily.  .Marland Kitchenesomeprazole (NEXIUM) 40 MG capsule  Take 40 mg by mouth daily.  .Marland KitchenFLUoxetine (PROZAC) 20 MG capsule Take 20 mg by mouth 2 (two) times daily.  . fluticasone (FLONASE) 50 MCG/ACT nasal spray Place 2 sprays into both nostrils daily.  . furosemide (LASIX) 20 MG tablet Take 1 tablet (20 mg total) by mouth daily.  .Marland Kitchenglucose blood test strip Check blood glucose three times weekly.  . Lancets (ONETOUCH ULTRASOFT) lancets Use as instructed  . loratadine (CLARITIN) 10 MG tablet Take 10 mg by mouth every evening.  .Marland KitchenLORazepam (ATIVAN) 0.5 MG tablet Take 0.5 mg by mouth every 12 (twelve) hours as needed for anxiety.  . nitroGLYCERIN (NITROSTAT) 0.4 MG SL tablet Place 0.4 mg under the tongue every 5 (five) minutes as needed for chest pain.  .Marland Kitchennystatin (MYCOSTATIN) 100000 UNIT/ML suspension Take 5 mLs (500,000 Units total) by mouth 4 (four) times daily.  . potassium chloride SA (K-DUR,KLOR-CON) 20 MEQ tablet TAKE 1 TABLET BY MOUTH EVERY DAY.  . senna-docusate (SENOKOT-S) 8.6-50 MG tablet Take 2 tablets by mouth 2 (two) times daily. Reported on 05/15/2015  . simvastatin (ZOCOR) 20 MG tablet Take 20 mg by mouth at bedtime.  . tamsulosin (FLOMAX) 0.4 MG CAPS capsule Take 1 capsule (0.4 mg total) by mouth daily. Take one capsule by mouth daily  . tiotropium (SPIRIVA  HANDIHALER) 18 MCG inhalation capsule Place 1 capsule (18 mcg total) into inhaler and inhale daily.   No current facility-administered medications on file prior to visit.     Review of Systems  Constitutional: Negative for chills and fever.  HENT: Negative.   Respiratory: Negative for chest tightness, shortness of breath and wheezing.   Cardiovascular: Negative for chest pain, palpitations and leg swelling.  Gastrointestinal: Negative for abdominal pain, nausea and vomiting.  Endocrine: Negative.   Genitourinary: Negative for discharge, dysuria, penile pain, testicular pain and urgency.  Musculoskeletal: Negative for arthralgias, back pain and joint swelling.  Skin: Negative.     Neurological: Positive for dizziness. Negative for weakness, numbness and headaches.  Psychiatric/Behavioral: Negative for dysphoric mood and sleep disturbance.   Per HPI unless specifically indicated above     Objective:    BP 110/60   Pulse (!) 55   Temp 98.1 F (36.7 C) (Oral)   Resp 16   Ht _0  (1.803 m)   Wt 228 lb (103.4 kg)   BMI 31.80 kg/m   Wt Readings from Last 3 Encounters:  11/17/15 228 lb (103.4 kg)  07/18/15 234 lb (106.1 kg)  06/19/15 236 lb (107 kg)    Physical Exam  Constitutional: He is oriented to person, place, and time. He appears well-developed and well-nourished. No distress.  HENT:  Head: Normocephalic and atraumatic.  Neck: Neck supple. No thyromegaly present.  Cardiovascular: Normal rate, regular rhythm and normal heart sounds.  Exam reveals no gallop and no friction rub.   No murmur heard. Pulmonary/Chest: Effort normal and breath sounds normal. He has no wheezes.  Abdominal: Soft. Bowel sounds are normal. He exhibits no distension. There is no tenderness. There is no rebound.  Musculoskeletal: Normal range of motion. He exhibits no edema or tenderness.  Neurological: He is alert and oriented to person, place, and time. He has normal reflexes.  Skin: Skin is warm and dry. No rash noted. No erythema.  Psychiatric: He has a normal mood and affect. His behavior is normal. Thought content normal.   Results for orders placed or performed in visit on 11/17/15  POCT HgB A1C  Result Value Ref Range   Hemoglobin A1C 5.9       Assessment & Plan:   Problem List Items Addressed This Visit      Cardiovascular and Mediastinum   Orthostatic hypotension - Primary    BP low at ENT today. Mildly low on recheck here at PCP. Will plan to 1/2 the dose of metoprolol and recheck his BP in 2 weeks. Reviewed changing positions slowly to allow blood pressure time to compensate.  Check CMP and CBC.       Relevant Medications   metoprolol succinate (TOPROL-XL)  50 MG 24 hr tablet   Other Relevant Orders   CBC with Differential   Abdominal aortic aneurysm (AAA) ([place])    Confirmed via ultrasound in 06/2015- recommendation was repeat US in 3 years.  Pt remains on beta blocker to help control BP.       Relevant Medications   metoprolol succinate (TOPROL-XL) 50 MG 24 hr tablet   Hypertension    Pt is hypotensive today as well as bradycardic. Will plan to reduce dose of metoprolol and recheck in 2 weeks. Alarm symptoms reviewed.       Relevant Medications   metoprolol succinate (TOPROL-XL) 50 MG 24 hr tablet   Other Relevant Orders   COMPLETE METABOLIC PANEL WITH GFR     Other  Prediabetes    A1c of 5.9% down from 6.2%- pt is doing well. Encouraged continue diet and lifestyle changes. Recheck in 3 mos.       Relevant Orders   POCT HgB A1C (Completed)    Other Visit Diagnoses   None.     Meds ordered this encounter  Medications  . meloxicam (MOBIC) 15 MG tablet  . metoprolol succinate (TOPROL-XL) 50 MG 24 hr tablet    Sig: Take 1 tablet (50 mg total) by mouth daily. Take with or immediately following a meal.    Dispense:  30 tablet    Refill:  11    Order Specific Question:   Supervising Provider    Answer:   Arlis Porta [phone]      Follow up plan: Return in about 2 weeks (around 12/01/2015), or if symptoms worsen or fail to improve, for Hypotension.

## 2015-11-17 NOTE — Assessment & Plan Note (Signed)
A1c of 5.9% down from 6.2%- pt is doing well. Encouraged continue diet and lifestyle changes. Recheck in 3 mos.

## 2015-12-04 ENCOUNTER — Other Ambulatory Visit: Payer: Self-pay | Admitting: Family Medicine

## 2015-12-04 DIAGNOSIS — R600 Localized edema: Secondary | ICD-10-CM

## 2015-12-08 ENCOUNTER — Telehealth: Payer: Self-pay | Admitting: Family Medicine

## 2015-12-08 NOTE — Telephone Encounter (Signed)
Pt has a question about furosemide.  His call back number is [phone]

## 2015-12-08 NOTE — Telephone Encounter (Signed)
Furosemide was denied b/c it was given short term for edema. Patient was suppose to f/u in 2 wks and did not. He does need to schedule appt with Dr. Raliegh Ip for follow-up.

## 2015-12-11 ENCOUNTER — Ambulatory Visit (INDEPENDENT_AMBULATORY_CARE_PROVIDER_SITE_OTHER): Payer: Medicare Other | Admitting: Family Medicine

## 2015-12-11 ENCOUNTER — Encounter: Payer: Self-pay | Admitting: Family Medicine

## 2015-12-11 VITALS — BP 154/84 | HR 62 | Temp 97.9°F | Resp 16 | Ht 71.0 in | Wt 229.0 lb

## 2015-12-11 DIAGNOSIS — R079 Chest pain, unspecified: Secondary | ICD-10-CM | POA: Diagnosis not present

## 2015-12-11 DIAGNOSIS — I951 Orthostatic hypotension: Secondary | ICD-10-CM

## 2015-12-11 DIAGNOSIS — I10 Essential (primary) hypertension: Secondary | ICD-10-CM

## 2015-12-11 DIAGNOSIS — I2510 Atherosclerotic heart disease of native coronary artery without angina pectoris: Secondary | ICD-10-CM | POA: Insufficient documentation

## 2015-12-11 DIAGNOSIS — I25118 Atherosclerotic heart disease of native coronary artery with other forms of angina pectoris: Secondary | ICD-10-CM | POA: Diagnosis not present

## 2015-12-11 NOTE — Telephone Encounter (Signed)
Attempted to call patient twice this message will be closed.

## 2015-12-11 NOTE — Assessment & Plan Note (Signed)
Acute on chronic Left sided chest pain, mild today but persistent. Clinically atypical without exertional symptoms, does radiate down left arm intermittently. Nearly identical chest pain in past, chart review in 2012 and patient admits has had similar episodes in past self limited. - Concern with known CAD, has not followed up with Cardiology in 5+ years, last cardiac cath 2012 with LAD lesion, on current med management  Plan: 1. Check EKG in office - NSR 60 HR, no acute ischemic ST-T changes, similar to prior comparison 02/2015 2. Improved BP on re-check 3. Increase Metoprolol XL back to 50mg  daily (take whole tab) 4. Continue ASA 81mg  daily, Statin 5. Patient may discard old NTG rx as it is expired, cautioned on will not refill today because of his history of orthostatic hypotension and recent very low BP 6. Urgent referral placed to [facility] to re-establish, likely needs further ischemic cardiac work-up given chronic chest pains and prior known CAD 7. Very strict return criteria reviewed with patient when to go to ED, call 911, if persistent worsening CP / pressure, dyspnea, not improving, or any exertional symptoms

## 2015-12-11 NOTE — Assessment & Plan Note (Signed)
Elevated BP today, improved on manual re-check. Concern with some persistent L sided chest pain today as well. He did not take metoprolol today, recently few weeks ago this was halved to 25mg , likely less controlled now. - Resume Metoprolol XL 50mg  daily now, especially in setting of CAD - Improve hydration, caution with sudden standing to avoid orthostatic hypotension - Monitor BP outside office, bring readings to next visit 2 weeks re-check

## 2015-12-11 NOTE — Progress Notes (Signed)
Subjective:    Patient ID: Charles Ewing, male    DOB: 1940-11-30, 75 y.o.   MRN: [id number]  Charles Ewing is a 75 y.o. male presenting on 12/11/2015 for Hypotension (follow up has some arm pain and chest pain on Left side onset 2 weeks )   HPI  Chest Pain, Acute on Chronic / H/o CAD - Reports onset of new Left sided chest pain, woke up with it 2 days ago, constant aching 2-3/10 pain, occasionally sharp more severe pain going down Left arm intermittently about 3-4 times a day. Symptoms are not related to any physical exertion, eating or any other known trigger. Initially thought were related to indigestion and gas, as he has had these symptoms before, previously improved with Tums this time no relief. He has not tried other medicines for this problem. Has old rx NTG SL but it is expired, he never took it even back when it was prescribed to him, so does not know how he reacts to it. - He receives some of his care at the [facility] for certain specialists such as ENT and Psychiatry. Chart review shows, he was seen by Cardiology at [facility] back in 2012 by Dr Clayborn Bigness, then referred to [facility]. He has prior diagnosis of CAD, previously had an abnormal stress test and last cardiac cath in 12/2010 showing a mid LAD obstructing lesion, referred to [facility], for additional work-up, managed medically and he apparently has not had a follow-up repeat catheterization. Initial hospitalization at that time was for similar chronic Left sided chest pain with sharp radiation down L arm, very similar to current episode. - Admits active mild left sided chest discomfort, without current radiation down left arm or jaw - Denies any exertional symptoms, dyspnea, nausea, vomiting, sweating, headache, pre-syncope or syncopal episode  CHRONIC HTN / H/o Orthostatic Hypotension: Reports does not check BP at home. Taking Metprolol-XL 70m (half tab, for 73mdaily for past 3 weeks) - did not take this today. Recent  course with prior concerns for orthostatic hypotension at ENT within past 1 month, worse upon sudden standing, metop was reduced from 5057maily to 73m70mily, and recently I did not refill his lasix when requested refill due to concern with hypotension, as it was being used for pedal edema, which has since resolved. Reports good compliance, did not take meds today. Tolerating well, w/o complaints. Denies edema, dizziness / lightheadedness   Social History  Substance Use Topics  . Smoking status: Current Every Day Smoker    Packs/day: 0.50  . Smokeless tobacco: Not on file  . Alcohol use No    Review of Systems Per HPI unless specifically indicated above     Objective:    BP (!) 154/84 (BP Location: Left Arm, Cuff Size: Normal)   Pulse 62   Temp 97.9 F (36.6 C) (Oral)   Resp 16   Ht 5' 11" (1.803 m)   Wt 229 lb (103.9 kg)   BMI 31.94 kg/m   Wt Readings from Last 3 Encounters:  12/11/15 229 lb (103.9 kg)  11/17/15 228 lb (103.4 kg)  07/18/15 234 lb (106.1 kg)    Physical Exam  Constitutional: He is oriented to person, place, and time. He appears well-developed and well-nourished. No distress.  Well-appearing, comfortable, cooperative  HENT:  Head: Normocephalic and atraumatic.  Mouth/Throat: Oropharynx is clear and moist.  Eyes: Conjunctivae and EOM are normal. Pupils are equal, round, and reactive to light.  Neck: Normal range of  motion. Neck supple. No thyromegaly present.  Left upper lateral C spine paraspinal muscles mild tender and with spasm. Negative Spurling's maneuver bilaterally without reproduction of Left arm radicular symptoms  Cardiovascular: Normal rate, regular rhythm, normal heart sounds and intact distal pulses.   No murmur heard. Pulmonary/Chest: Effort normal and breath sounds normal. No respiratory distress. He has no wheezes. He has no rales. He exhibits no tenderness (Chest wall non-reproducible tenderness).  Abdominal: Soft. Bowel sounds are  normal. He exhibits no distension and no mass. There is no tenderness.  Musculoskeletal: Normal range of motion. He exhibits no edema or tenderness.  Lymphadenopathy:    He has no cervical adenopathy.  Neurological: He is alert and oriented to person, place, and time.  Skin: Skin is warm and dry. No rash noted. He is not diaphoretic.  Psychiatric: He has a normal mood and affect. His behavior is normal.  Nursing note and vitals reviewed.  I have personally reviewed the following lab results from 11/17/15.  Results for orders placed or performed in visit on 11/17/15  COMPLETE METABOLIC PANEL WITH GFR  Result Value Ref Range   Sodium 137 135 - 146 mmol/L   Potassium 4.4 3.5 - 5.3 mmol/L   Chloride 104 98 - 110 mmol/L   CO2 27 20 - 31 mmol/L   Glucose, Bld 94 65 - 99 mg/dL   BUN 12 7 - 25 mg/dL   Creat 1.06 0.70 - 1.18 mg/dL   Total Bilirubin 0.4 0.2 - 1.2 mg/dL   Alkaline Phosphatase 47 40 - 115 U/L   AST 21 10 - 35 U/L   ALT 19 9 - 46 U/L   Total Protein 5.6 (L) 6.1 - 8.1 g/dL   Albumin 3.4 (L) 3.6 - 5.1 g/dL   Calcium 8.7 8.6 - 10.3 mg/dL   GFR, Est African American 79 >=60 mL/min   GFR, Est Non African American 68 >=60 mL/min  CBC with Differential  Result Value Ref Range   WBC 9.0 3.8 - 10.8 K/uL   RBC 5.23 4.20 - 5.80 MIL/uL   Hemoglobin 15.9 13.2 - 17.1 g/dL   HCT 48.5 38.5 - 50.0 %   MCV 92.7 80.0 - 100.0 fL   MCH 30.4 27.0 - 33.0 pg   MCHC 32.8 32.0 - 36.0 g/dL   RDW 15.4 (H) 11.0 - 15.0 %   Platelets 209 140 - 400 K/uL   MPV 9.8 7.5 - 12.5 fL   Neutro Abs 5,580 1,500 - 7,800 cells/uL   Lymphs Abs 2,610 850 - 3,900 cells/uL   Monocytes Absolute 630 200 - 950 cells/uL   Eosinophils Absolute 180 15 - 500 cells/uL   Basophils Absolute 0 0 - 200 cells/uL   Neutrophils Relative % 62 %   Lymphocytes Relative 29 %   Monocytes Relative 7 %   Eosinophils Relative 2 %   Basophils Relative 0 %   Smear Review Criteria for review not met   POCT HgB A1C  Result Value Ref  Range   Hemoglobin A1C 5.9       Assessment & Plan:   Problem List Items Addressed This Visit    Orthostatic hypotension    Currently elevated BP, but concern given history of orthostatic hypotension. - Remain off Lasix, discontinued today (was only rx for pedal edema, and suspect this may have inc risk of orthostasis) - Increase Metoprolol XL back to 47m daily given uncontrolled BP - Improve hydration, standing precautions - Monitor BP at home, bring log  to next visit 2 weeks      Left sided chest pain - Primary    Acute on chronic Left sided chest pain, mild today but persistent. Clinically atypical without exertional symptoms, does radiate down left arm intermittently. Nearly identical chest pain in past, chart review in 2012 and patient admits has had similar episodes in past self limited. - Concern with known CAD, has not followed up with Cardiology in 5+ years, last cardiac cath 2012 with LAD lesion, on current med management  Plan: 1. Check EKG in office - NSR 60 HR, no acute ischemic ST-T changes, similar to prior comparison 02/2015 2. Improved BP on re-check 3. Increase Metoprolol XL back to 67m daily (take whole tab) 4. Continue ASA 860mdaily, Statin 5. Patient may discard old NTG rx as it is expired, cautioned on will not refill today because of his history of orthostatic hypotension and recent very low BP 6. [facility], likely needs further ischemic cardiac work-up given chronic chest pains and prior known CAD 7. Very strict return criteria reviewed with patient when to go to ED, call 911, if persistent worsening CP / pressure, dyspnea, not improving, or any exertional symptoms      Relevant Orders   EKG 12-Lead   Ambulatory referral to Cardiology   Hypertension    Elevated BP today, improved on manual re-check. Concern with some persistent L sided chest pain today as well. He did not take metoprolol today,  recently few weeks ago this was halved to 2519mlikely less controlled now. - Resume Metoprolol XL 19m33mily now, especially in setting of CAD - Improve hydration, caution with sudden standing to avoid orthostatic hypotension - Monitor BP outside office, bring readings to next visit 2 weeks re-check      Relevant Orders   Ambulatory referral to Cardiology   Coronary artery disease    Chronic CAD with documented last cardiac cath 2012 mid LAD disease, previously with local Cardiology, and [facility]. He apparently has been lost to follow-up, as they anticipated repeat cath. - Current acute on chronic left sided chest pain with L arm radiation, see below, seems to be chronic recurrent problem - [facility], re-evaluate ischemic work-up - Continue ASA 81, Metoprolol, Statin, not currently on ACE/ARB      Relevant Orders   Ambulatory referral to Cardiology    Other Visit Diagnoses   None.     No orders of the defined types were placed in this encounter.     Follow up plan: Return in about 2 weeks (around 12/25/2015) for blood pressure.  AlexNobie Putnam [facility] 12/11/2015, 6:05 PM

## 2015-12-11 NOTE — Assessment & Plan Note (Signed)
Currently elevated BP, but concern given history of orthostatic hypotension. - Remain off Lasix, discontinued today (was only rx for pedal edema, and suspect this may have inc risk of orthostasis) - Increase Metoprolol XL back to 50mg  daily given uncontrolled BP - Improve hydration, standing precautions - Monitor BP at home, bring log to next visit 2 weeks

## 2015-12-11 NOTE — Patient Instructions (Signed)
Thank you for coming in to clinic today.  1. Your EKG does not show acute signs of heart attack or significant change. Compared to 02/2015. - Your BP was initially elevated now, improved somewhat, but still mildly elevated, likely due to not taking Metoprolol yet today - Go ahead and increase back to 1 whole pill Metoprolol 50mg  daily starting today - Do not take the old expired Nitroglycerin. No refill as discussed given we do not know how you react to it, and with your history of Hypotension, I do not want to cause a worsening Hypotension or passing out. - Continue taking Aspirin 81mg  daily and your other medications - Discontinue lasix at this time, since no swelling  Urgent Referral to Dr Clayborn Bigness [facility]) - You were last seen in 2012, to re-establish and evaluate your Left sided chest pain.  [facility]   Address: [address] [place], [place], [place] [address] Phone: [phone]  If your symptoms of Left Sided Chest pain does not improve, especially more severe pain that does not go away within 30 minutes, is accompanied by nausea, sweating, shortness of breath, or made worse by activity, this may be evidence of a heart attack, especially if symptoms worsening instead of improving, please call 911 or go directly to the emergency room immediately for evaluation.  Recommend get a Blood pressure cuff and check home BP regularly for few days then once a day, both morning and night different times, write down on the printed Log and bring to next visit.  Please schedule a follow-up appointment with Dr. Parks Ranger in 2 to 4 weeks for Blood Pressure follow-up  If you have any other questions or concerns, please feel free to call the clinic or send a message through [place]. You may also schedule an earlier appointment if necessary.  [name], DO [place]

## 2015-12-11 NOTE — Assessment & Plan Note (Signed)
Chronic CAD with documented last cardiac cath 2012 mid LAD disease, previously with local Cardiology, and [facility]. He apparently has been lost to follow-up, as they anticipated repeat cath. - Current acute on chronic left sided chest pain with L arm radiation, see below, seems to be chronic recurrent problem - Urgent referral re-establish with [facility], re-evaluate ischemic work-up - Continue ASA 81, Metoprolol, Statin, not currently on ACE/ARB

## 2015-12-12 ENCOUNTER — Telehealth: Payer: Self-pay | Admitting: Family Medicine

## 2015-12-12 NOTE — Telephone Encounter (Signed)
Done.[place] 

## 2015-12-12 NOTE — Telephone Encounter (Signed)
Betsy with [facility] needs office notes, ekg, demographics, and ins information faxed to [phone].  He has appt schedule for tomorrow at 9:30

## 2015-12-12 NOTE — Telephone Encounter (Signed)
Patient aware of appt/time/date/location.

## 2015-12-12 NOTE — Telephone Encounter (Signed)
Pt's brother, Francee Piccolo asked where pt's appt is on Wednesday at 9:30.  He will be taking pt to appt and needs doctor's name and location.  His call back number is [phone]

## 2015-12-13 DIAGNOSIS — R0602 Shortness of breath: Secondary | ICD-10-CM | POA: Insufficient documentation

## 2015-12-18 ENCOUNTER — Other Ambulatory Visit: Payer: Self-pay

## 2015-12-18 ENCOUNTER — Other Ambulatory Visit: Payer: Medicare Other

## 2015-12-18 DIAGNOSIS — R972 Elevated prostate specific antigen [PSA]: Secondary | ICD-10-CM

## 2015-12-19 LAB — PSA: Prostate Specific Ag, Serum: 5.6 ng/mL — ABNORMAL HIGH (ref 0.0–4.0)

## 2015-12-20 ENCOUNTER — Telehealth: Payer: Self-pay

## 2015-12-20 NOTE — Telephone Encounter (Signed)
No vm set up. 

## 2015-12-20 NOTE — Telephone Encounter (Signed)
-----   Message from Ardis Hughs, MD sent at 12/20/2015  5:26 AM EST ----- Please let patient know that his PSA has improved but still remains slightly elevated.

## 2015-12-21 ENCOUNTER — Encounter: Payer: Self-pay | Admitting: Urology

## 2015-12-21 ENCOUNTER — Ambulatory Visit (INDEPENDENT_AMBULATORY_CARE_PROVIDER_SITE_OTHER): Payer: Medicare Other | Admitting: Urology

## 2015-12-21 VITALS — BP 171/92 | HR 69 | Wt 230.5 lb

## 2015-12-21 DIAGNOSIS — R972 Elevated prostate specific antigen [PSA]: Secondary | ICD-10-CM | POA: Diagnosis not present

## 2015-12-21 NOTE — Progress Notes (Signed)
12/21/2015 1:55 PM   Charles Ewing 25-Jun-1940 [id number]  Referring provider: Luciana Axe, NP No address on file  Chief Complaint  Patient presents with  . Follow-up    urinary frequency    HPI: 75 year old male who is referred for further evaluation of an elevated PSA.  The patient's most recent PSA was 7.9 in April 2017. Based on the patient's records there is report of the patient having a PSA of 8.8 in January 2017. There are no additional reports her lab results available. The patient is a [place] patient. He tells me that he has had 3 negative prostate biopsies in the past. Repeat PSA in November 2017 was 5.7. No new complaints at this time.  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 He underwent a TURP in 2010 by Dr. Bernardo Heater. Since the patient's bladder outlet procedure he denies any voiding symptoms.        PMH: Past Medical History:  Diagnosis Date  . Abdominal aortic aneurysm (AAA) ([place]) 2013   found at [facility]-  . Anxiety   . Arrhythmia   . Arthritis   . Colon polyp   . COPD (chronic obstructive pulmonary disease) ([place])   . Coronary artery disease   . Depression   . GERD (gastroesophageal reflux disease)   . Hyperlipidemia   . Hypertension   . Skin cancer   . Thrush     Surgical History: Past Surgical History:  Procedure Laterality Date  . Colon polyp surgery    . SKIN CANCER EXCISION    . TRANSURETHRAL RESECTION OF PROSTATE  2010    Home Medications:    Medication List       Accurate as of 12/21/15  1:55 PM. Always use your most recent med list.          albuterol 108 (90 Base) MCG/ACT inhaler Commonly known as:  PROVENTIL HFA;VENTOLIN HFA Inhale 2 puffs into the lungs every 6 (six) hours as needed for wheezing or shortness of breath.   aspirin EC 81 MG tablet Take 81 mg by mouth every evening.   FLUoxetine 20 MG capsule Commonly known as:  PROZAC Take 20 mg by mouth 2 (two) times daily.   fluticasone 50  MCG/ACT nasal spray Commonly known as:  FLONASE Place 2 sprays into both nostrils daily.   glucose blood test strip Check blood glucose three times weekly.   loratadine 10 MG tablet Commonly known as:  CLARITIN Take 10 mg by mouth every evening.   LORazepam 0.5 MG tablet Commonly known as:  ATIVAN Take 0.5 mg by mouth every 12 (twelve) hours as needed for anxiety.   Medical Compression Stockings Misc 1 each by Does not apply route daily.   meloxicam 15 MG tablet Commonly known as:  MOBIC   metoprolol succinate 50 MG 24 hr tablet Commonly known as:  TOPROL-XL Take 1 tablet (50 mg total) by mouth daily. Take with or immediately following a meal.   nystatin 100000 UNIT/ML suspension Commonly known as:  MYCOSTATIN Take 5 mLs (500,000 Units total) by mouth 4 (four) times daily.   ONE TOUCH ULTRA SYSTEM KIT w/Device Kit 1 kit by Does not apply route once. CHeck blood glucose in the AM three times weekly.   onetouch ultrasoft lancets Use as instructed   potassium chloride SA 20 MEQ tablet Commonly known as:  K-DUR,KLOR-CON TAKE 1 TABLET BY MOUTH EVERY DAY.   senna-docusate 8.6-50 MG tablet Commonly known as:  Senokot-S  Take 2 tablets by mouth 2 (two) times daily. Reported on 05/15/2015   simvastatin 20 MG tablet Commonly known as:  ZOCOR Take 20 mg by mouth at bedtime.   tamsulosin 0.4 MG Caps capsule Commonly known as:  FLOMAX Take 1 capsule (0.4 mg total) by mouth daily. Take one capsule by mouth daily   tiotropium 18 MCG inhalation capsule Commonly known as:  SPIRIVA HANDIHALER Place 1 capsule (18 mcg total) into inhaler and inhale daily.       Allergies: No Known Allergies  Family History: Family History  Problem Relation Age of Onset  . Kidney cancer Mother   . Prostate cancer Neg Hx   . Bladder Cancer Neg Hx     Social History:  reports that he has been smoking.  He has been smoking about 0.50 packs per day. He does not have any smokeless tobacco  history on file. He reports that he does not drink alcohol or use drugs.  ROS: UROLOGY Frequent Urination?: No Hard to postpone urination?: No Burning/pain with urination?: No Get up at night to urinate?: No Leakage of urine?: No Urine stream starts and stops?: No Trouble starting stream?: No Do you have to strain to urinate?: No Blood in urine?: No Urinary tract infection?: No Sexually transmitted disease?: No Injury to kidneys or bladder?: No Painful intercourse?: No Weak stream?: No Erection problems?: No Penile pain?: No  Gastrointestinal Nausea?: No Vomiting?: No Indigestion/heartburn?: Yes Diarrhea?: No Constipation?: No  Constitutional Fever: No Night sweats?: No Weight loss?: No Fatigue?: No  Skin Skin rash/lesions?: No Itching?: No  Eyes Blurred vision?: No Double vision?: No  Ears/Nose/Throat Sore throat?: No Sinus problems?: No  Hematologic/Lymphatic Swollen glands?: No Easy bruising?: No  Cardiovascular Leg swelling?: No Chest pain?: Yes  Respiratory Cough?: Yes Shortness of breath?: Yes  Endocrine Excessive thirst?: No  Musculoskeletal Back pain?: No Joint pain?: No  Neurological Headaches?: No Dizziness?: No  Psychologic Depression?: Yes Anxiety?: No  Physical Exam: BP (!) 171/92   Pulse 69   Wt 230 lb 8 oz (104.6 kg)   BMI 32.15 kg/m   Constitutional:  Alert and oriented, No acute distress. HEENT: [place] AT, moist mucus membranes.  Trachea midline, no masses. Cardiovascular: No clubbing, cyanosis, or edema. Respiratory: Normal respiratory effort, no increased work of breathing. GI: Abdomen is soft, nontender, nondistended, no abdominal masses GU: No CVA tenderness.  Skin: No rashes, bruises or suspicious lesions. Lymph: No cervical or inguinal adenopathy. Neurologic: Grossly intact, no focal deficits, moving all 4 extremities. Psychiatric: Normal mood and affect.  Laboratory Data: Lab Results  Component Value Date     WBC 9.0 11/17/2015   HGB 15.9 11/17/2015   HCT 48.5 11/17/2015   MCV 92.7 11/17/2015   PLT 209 11/17/2015    Lab Results  Component Value Date   CREATININE 1.06 11/17/2015    No results found for: PSA  No results found for: TESTOSTERONE  Lab Results  Component Value Date   HGBA1C 5.9 11/17/2015    Urinalysis    Component Value Date/Time   COLORURINE YELLOW (A) 03/01/2015 0235   APPEARANCEUR Clear 06/19/2015 1413   LABSPEC 1.013 03/01/2015 0235   LABSPEC 1.015 10/15/2013 0912   PHURINE 6.0 03/01/2015 0235   GLUCOSEU Negative 06/19/2015 1413   GLUCOSEU Negative 10/15/2013 0912   HGBUR NEGATIVE 03/01/2015 0235   BILIRUBINUR Negative 06/19/2015 1413   BILIRUBINUR Negative 10/15/2013 0912   KETONESUR NEGATIVE 03/01/2015 0235   PROTEINUR Negative 06/19/2015 1413   PROTEINUR  NEGATIVE 03/01/2015 0235   NITRITE Negative 06/19/2015 1413   NITRITE NEGATIVE 03/01/2015 0235   LEUKOCYTESUR 1+ (A) 06/19/2015 1413   LEUKOCYTESUR Trace 10/15/2013 0912      Assessment & Plan:    1. Elevated PSA I had a long discussion with the patient regarding AUA screening guidelines for prostate cancer. We discussed that generally speaking based on these guidelines at screening is no longer recommended after the age of 62. The patient has had 3 negative biopsies now and his PSA though elevated has continued to trend down over the last year. We discussed at this time to be reasonable to stop PSA screening altogether based and the natural course and history of prostate cancer. We also discussed if the patient was interested he did continue to have his PSA to screen every 6 months. The patient has elected after our discussion to no longer have his PSA checked. This is a very reasonable decision based on what we know about prostate cancer. He will follow-up with [place] as needed.  Return if symptoms worsen or fail to improve.  Nickie Retort, MD  [facility] [address] [place], [place] [address] [phone]

## 2015-12-25 NOTE — Telephone Encounter (Signed)
LMOM

## 2015-12-26 ENCOUNTER — Other Ambulatory Visit: Payer: Self-pay | Admitting: Family Medicine

## 2015-12-26 MED ORDER — NYSTATIN 100000 UNIT/ML MT SUSP: 5.0000 mL | Freq: Four times a day (QID) | OROMUCOSAL | 1 refills | Status: DC

## 2015-12-26 NOTE — Telephone Encounter (Signed)
No answer. Will send a letter.  

## 2015-12-27 ENCOUNTER — Telehealth: Payer: Self-pay | Admitting: Family Medicine

## 2015-12-27 NOTE — Telephone Encounter (Signed)
Charles Ewing with [facility] needs a verbal order for recertification to continue home health care.  They would like to continue monthly observation.  Her call back number is [phone]

## 2015-12-27 NOTE — Telephone Encounter (Signed)
Called back, spoke with Judson Roch with [facility], gave her verbal confirmation order for re-certify home health care for monthly observation. She reported patient is awaiting further cardiac work-up among other chronic problems. No further orders needed at this time.  [name], [facility] 12/27/2015, 5:36 PM

## 2016-01-12 ENCOUNTER — Telehealth: Payer: Self-pay | Admitting: Family Medicine

## 2016-01-12 NOTE — Telephone Encounter (Signed)
Spoke with [facility] nurse regarding patient update, today with elevated BP 180/100, otherwise asymptomatic without headache, chest pain or worsening swelling. He has remained off Lasix and K since last visit, they notify us today to check if we want to make a change. Patient was referred by me to return back to Cardiology scheduled on Monday 01/15/16. Nursing reports no significant return of lower extremity swelling since he has been off of lasix, and he has a concerning history of prior hypotension before.  I advised them that at this time, to remain off Lasix and potassium for now, if worsening symptoms or concerns with HTN, then can seek more immediate medical attention, otherwise follow-up as planned on Monday to review BP / CAD / possible CHF management with Cardiology.  [name], [facility] 01/12/2016, 6:06 PM

## 2016-02-21 ENCOUNTER — Other Ambulatory Visit: Payer: Self-pay | Admitting: Family Medicine

## 2016-02-21 NOTE — Telephone Encounter (Signed)
Reviewed chart. I have not discussed this medication with patient at our previous office visits. He was not prescribed it by his prior PCP here at [facility] Amy Krebs. Reviewed [place] Controlled Substance Reporting System, in past 1 year he has received only one prescription for Lorazepam 0.5mg  tablets BID PRN #60 tablets for 30 day supply with 1 refill, this was prescribed on 08/14/15, and he refilled it last 01/23/16, it was prescribed by Dr Nicky Pugh MD (Internal Medicine in [facility]).  Please notify patient that I do not plan to refill this medication. Also, I would like to know if he is seeking medical care from another primary doctor, Dr Brynda Greathouse locally as well.  [name], [facility]  02/21/2016, 12:27 PM

## 2016-02-22 ENCOUNTER — Ambulatory Visit (INDEPENDENT_AMBULATORY_CARE_PROVIDER_SITE_OTHER): Payer: Medicare Other | Admitting: Podiatry

## 2016-02-22 ENCOUNTER — Encounter: Payer: Self-pay | Admitting: Podiatry

## 2016-02-22 VITALS — BP 178/82 | HR 64 | Resp 18

## 2016-02-22 DIAGNOSIS — M79676 Pain in unspecified toe(s): Secondary | ICD-10-CM

## 2016-02-22 DIAGNOSIS — L600 Ingrowing nail: Secondary | ICD-10-CM

## 2016-02-22 DIAGNOSIS — B351 Tinea unguium: Secondary | ICD-10-CM

## 2016-02-22 NOTE — Progress Notes (Signed)
   Subjective:    Patient ID: Charles Ewing, male    DOB: 11-22-1940, 76 y.o.   MRN: [id number]  HPI   I have an ingrown toenail on my right big toe and it is sore and does not drain and they say I am a pre-diabetic and is a little red and tender at times    Review of Systems  All other systems reviewed and are negative.      Objective:   Physical Exam        Assessment & Plan:

## 2016-02-22 NOTE — Progress Notes (Addendum)
Subjective: 76 year old male presents the office they for concerns of a possible starting of an ingrown tenderness of the right big toe he states his other nails are doing the same thing. His nails are thick and discolored which causes irritation in shoe gear. He states that last time he was here to get the toenails trimmed and that did well for a long time he is asking for the same treatment today. Denies any systemic complaints such as fevers, chills, nausea, vomiting. No acute changes since last appointment, and no other complaints at this time.   His last A1c was 5.9  Objective: AAO x3, NAD DP/PT pulses palpable bilaterally 2/4, CRT less than 3 seconds Protective sensation intact with Simms Weinstein monofilament, vibratory sensation intact, Achilles tendon  Nails are hypertrophic, dystrophic, brittle, discolored, elongated 10. There is mild incurvation on the medial nail border the right hallux toenail distally. No surrounding redness or drainage. Tenderness nails 1-5 bilaterally. No open lesions or pre-ulcerative lesions are identified today. No pain with calf compression, swelling, warmth, erythema  Assessment: Symptomatic onychomycosis; ingrown toenail   Plan: -All treatment options discussed with the patient including all alternatives, risks, complications.  -Nails debrided 10 without complications or bleeding. Discussed that if the quadrant the nail continues to be symptomatic may need to have a partial nail avulsion however he wishes to hold off on this today. -Daily foot inspection. -Patient encouraged to call the office with any questions, concerns, change in symptoms.   Celesta Gentile, DPM

## 2016-03-14 ENCOUNTER — Telehealth: Payer: Self-pay | Admitting: Family Medicine

## 2016-03-14 DIAGNOSIS — Z20828 Contact with and (suspected) exposure to other viral communicable diseases: Secondary | ICD-10-CM

## 2016-03-14 MED ORDER — OSELTAMIVIR PHOSPHATE 75 MG PO CAPS: 75.0000 mg | ORAL_CAPSULE | Freq: Two times a day (BID) | ORAL | 0 refills | Status: DC

## 2016-03-14 NOTE — Telephone Encounter (Signed)
Sent rx for Tamiflu 75mg  BID x 5 days for influenza coverage given symptoms and exposure. Sent to [facility].  [name], [facility] 03/14/2016, 2:41 PM

## 2016-03-14 NOTE — Telephone Encounter (Signed)
[name] with [facility] said pt called and said he was exposed to flu.   He has a scratchy throat and asked if he could have tamiflu sent to [place].  Her call back number is [phone]

## 2016-03-22 ENCOUNTER — Encounter: Payer: Self-pay | Admitting: Family Medicine

## 2016-03-22 DIAGNOSIS — M170 Bilateral primary osteoarthritis of knee: Secondary | ICD-10-CM | POA: Insufficient documentation

## 2016-04-09 ENCOUNTER — Other Ambulatory Visit: Payer: Self-pay | Admitting: Family Medicine

## 2016-04-09 MED ORDER — NYSTATIN 100000 UNIT/ML MT SUSP: 5.0000 mL | Freq: Four times a day (QID) | OROMUCOSAL | 1 refills | Status: DC

## 2016-04-17 ENCOUNTER — Other Ambulatory Visit: Payer: Self-pay | Admitting: *Deleted

## 2016-04-17 ENCOUNTER — Ambulatory Visit (INDEPENDENT_AMBULATORY_CARE_PROVIDER_SITE_OTHER): Payer: Medicare Other | Admitting: Family Medicine

## 2016-04-17 ENCOUNTER — Encounter: Payer: Self-pay | Admitting: Family Medicine

## 2016-04-17 VITALS — BP 143/76 | HR 63 | Temp 98.2°F | Resp 16 | Ht 71.0 in | Wt 228.0 lb

## 2016-04-17 DIAGNOSIS — K5901 Slow transit constipation: Secondary | ICD-10-CM | POA: Diagnosis not present

## 2016-04-17 DIAGNOSIS — F99 Mental disorder, not otherwise specified: Secondary | ICD-10-CM | POA: Diagnosis not present

## 2016-04-17 DIAGNOSIS — G4700 Insomnia, unspecified: Secondary | ICD-10-CM | POA: Insufficient documentation

## 2016-04-17 DIAGNOSIS — F419 Anxiety disorder, unspecified: Secondary | ICD-10-CM

## 2016-04-17 DIAGNOSIS — F5105 Insomnia due to other mental disorder: Secondary | ICD-10-CM | POA: Diagnosis not present

## 2016-04-17 DIAGNOSIS — R109 Unspecified abdominal pain: Secondary | ICD-10-CM

## 2016-04-17 MED ORDER — DICYCLOMINE HCL 10 MG PO CAPS: 10.0000 mg | ORAL_CAPSULE | Freq: Three times a day (TID) | ORAL | 0 refills | Status: DC

## 2016-04-17 MED ORDER — LORAZEPAM 0.5 MG PO TABS: 0.5000 mg | ORAL_TABLET | Freq: Two times a day (BID) | ORAL | 2 refills | Status: DC | PRN

## 2016-04-17 MED ORDER — POLYETHYLENE GLYCOL 3350 17 GM/SCOOP PO POWD: ORAL | 2 refills | Status: DC

## 2016-04-17 NOTE — Assessment & Plan Note (Signed)
Suspected constipation contributing to recent abdominal pain with known chronic constipation recent worsening, attributed to variety of factors including poor hydration and dietary. - Improved pain with bowel movement, usually requires laxative  Plan: 1. Start Miralax regular dosing half to 1 cap daily for now, titration instructions goal 1-2 soft stool daily, up to 1 cap BID vs 2 caps daily +/- 2. May take senna/docustate PRN if acute worsening 3. Improve hydration, fiber in diet 4. Follow-up as needed

## 2016-04-17 NOTE — Patient Instructions (Signed)
Thank you for coming in to clinic today.  1. Most likely abdominal pain and cramping due to constipation and possibly with the recent hot dog. It does not sound like a viral illness and stomach bug - Start regular Miralax powder as discussed for daily maintenance to keep a daily soft stool, use half to whole capful in large glass of water daily, you can increase dose up to 2 caps per dose or 2 cap twice a day if significant acute constipation - If needed can still take the senna-kot docusate as you are were using before  If worsening abdominal cramping again, can try Bentyl take one capsule with food, up to 4 times a day, or one at bedtime can help too  Please schedule a follow-up appointment with Dr. Parks Ranger in 3 months for HTN, med follow-up  If you have any other questions or concerns, please feel free to call the clinic or send a message through [place]. You may also schedule an earlier appointment if necessary.  [name], DO [place]

## 2016-04-17 NOTE — Progress Notes (Signed)
Subjective:    Patient ID: Charles Ewing, male    DOB: Mar 01, 1940, 76 y.o.   MRN: [id number]  Charles Ewing is a 76 y.o. male presenting on 04/17/2016 for Abdominal Pain (onset 5 days had sore and abdominal pain lower side but improving no nausea or diarrhea)   HPI  Abdominal Pain, Acute / CONSTIPATION - Reports new onset mid abdominal pain, acute onset while resting sitting at home started about 4-5 days ago, describes severe 9 out of 10 throbbing pain, with intermittent flares lasting minutes to hours. Earlier that day he ate a hotdog at [employer] in [place], but no known recent contacts with similar illness. Recent sick contact with neighbors have viral bug with diarrhea, but no significant close contact. - Now in interval has had some improving abdominal pain, now some residual soreness bilateral mid abdomen but no longer severe throbbing pain. - Admitted some associated constipation (usual BMs about every 2-3 days, usually dry hard stool), limited water intake, often does take a OTC laxative to help have BM. Did have BM later that day Saturday with onset of symptoms without relief - [facility] nursing care, doing well provides compliments for that service today - Followed by Cardiology [facility]) for AAA among other issues, last seen 02/2016, not due for repeat AAA imaging had been stable previously  - Denies any associated symptoms at onset with fevers/chills, sweats, nausea, vomiting, diarrhea, chest pain, dyspnea  CHRONIC ANXIETY, Insomnia: - Reports chronic problem >20 years with anxiety and related insomnia, he suspects some possible contributing PTSD, however never received formal psychiatric evaluation in past. Has been taking Lorazepam chronically with good results. - No prior history of BDZ withdrawal, takes most days, usually once nightly doing well on it, sometimes needs twice daily - Previously rx by long-time PCP Dr Brynda Greathouse, had existing prior rx and ample  amount, now nearly due for next refill - Previously followed by [place], but limited ability to travel to facility now, he does go occasionally for hearing evaluation - Denies alcohol consumption, other substances - Denies worsening anxiety, depression, mood instability, hallucinations, abnormal perceptions  Social History  Substance Use Topics  . Smoking status: Current Every Day Smoker    Packs/day: 0.50  . Smokeless tobacco: Current User  . Alcohol use No    Review of Systems Per HPI unless specifically indicated above     Objective:    BP (!) 143/76   Pulse 63   Temp 98.2 F (36.8 C) (Oral)   Resp 16   Ht 5\' 11"  (1.803 m)   Wt 228 lb (103.4 kg)   BMI 31.80 kg/m   Wt Readings from Last 3 Encounters:  04/17/16 228 lb (103.4 kg)  12/21/15 230 lb 8 oz (104.6 kg)  12/11/15 229 lb (103.9 kg)    Physical Exam  Constitutional: He appears well-developed and well-nourished. No distress.  Well appearing, comfortable, cooperative  HENT:  Head: Normocephalic and atraumatic.  Mouth/Throat: Oropharynx is clear and moist.  Eyes: Conjunctivae are normal.  Cardiovascular: Normal rate, regular rhythm, normal heart sounds and intact distal pulses.   No murmur heard. Pulmonary/Chest: Effort normal.  Abdominal: Soft. Bowel sounds are normal. He exhibits no distension and no mass. There is no tenderness. There is no rebound and no guarding.  Chronic abdominal wall midline incisional scar, with soft tissue deformity. Non-tender. No palpable pulsation.  Hyperactive bowel sounds  Neurological: He is alert.  Distal sensation intact, no tremors  Skin: Skin  is warm and dry. He is not diaphoretic.  Psychiatric: He has a normal mood and affect. His behavior is normal.  Well groomed, good eye contact, normal speech and thoughts, not anxious appearing  Nursing note and vitals reviewed.      Assessment & Plan:   Problem List Items Addressed This Visit    Slow transit constipation     Suspected constipation contributing to recent abdominal pain with known chronic constipation recent worsening, attributed to variety of factors including poor hydration and dietary. - Improved pain with bowel movement, usually requires laxative  Plan: 1. Start Miralax regular dosing half to 1 cap daily for now, titration instructions goal 1-2 soft stool daily, up to 1 cap BID vs 2 caps daily +/- 2. May take senna/docustate PRN if acute worsening 3. Improve hydration, fiber in diet 4. Follow-up as needed      Relevant Medications   polyethylene glycol powder (GLYCOLAX/MIRALAX) powder   dicyclomine (BENTYL) 10 MG capsule   Insomnia    Likely secondary to chronic anxiety / possible PTSD, on chronic BDZ ativan with improvement, >20 years -See anxiety A&P - Refilled Lorazepam for 3 month supply      Relevant Medications   LORazepam (ATIVAN) 0.5 MG tablet   Anxiety    Stable chronic problem >20 years, on BDZ, by history seems like prior history of possible PTSD contributing, with poor sleep - No long established with Psychiatry  Plan 1. Refilled chronic Lorazepam 0.5mg  1-2 times daily #60, +2 refills for 3 month supply - discussed risks of chronic BDZ therapy, concerns of dependence, withdrawal, abuse potential. He is aware not to consume alcohol, has not had problem with withdrawal before. Not taking BID every night 2. Anticipate if need to continue Lorazepam will likely have him sign controlled substance contract to continue temporary supply, likely check UDS as well next visit or in future 3. Follow-up q 3 months - future consider alternative agents and other therapy      Relevant Medications   LORazepam (ATIVAN) 0.5 MG tablet    Other Visit Diagnoses    Acute abdominal pain    -  Primary  Suspected due to underlying acute on chronic constipation, now with notable improvement few days later with bowel movement following laxative. History also possible related to food related  etiology, but not consistent with viral gastro or enteritis / food poisoning. Unlikely related to other abdominal etiology such as AAA or vascular problem, given recent check-up and currently benign abdomen. - Well appearing now, afebrile, non-tender, benign abdomen  Plan: 1. Treat underlying constipation, goal for prevention in future 2. Also give rx Bentyl PRN cramping 3. Return criteria reviewed    Relevant Medications   polyethylene glycol powder (GLYCOLAX/MIRALAX) powder   dicyclomine (BENTYL) 10 MG capsule      Meds ordered this encounter  Medications  . diclofenac sodium (VOLTAREN) 1 % GEL  . polyethylene glycol powder (GLYCOLAX/MIRALAX) powder    Sig: Use half up to 1 whole capful daily for maintenance, if acute worsening constipation can increase to 2 caps daily or 1 cap twice daily    Dispense:  250 g    Refill:  2  . dicyclomine (BENTYL) 10 MG capsule    Sig: Take 1 capsule (10 mg total) by mouth 4 (four) times daily -  before meals and at bedtime.    Dispense:  30 capsule    Refill:  0  . LORazepam (ATIVAN) 0.5 MG tablet    Sig:  Take 1 tablet (0.5 mg total) by mouth every 12 (twelve) hours as needed for anxiety.    Dispense:  60 tablet    Refill:  2    Follow up plan: Return in about 3 months (around 07/18/2016) for blood pressure.  [name], [facility] 04/17/2016, 10:53 PM

## 2016-04-17 NOTE — Assessment & Plan Note (Signed)
Stable chronic problem >20 years, on BDZ, by history seems like prior history of possible PTSD contributing, with poor sleep - No long established with Psychiatry  Plan 1. Refilled chronic Lorazepam 0.5mg  1-2 times daily #60, +2 refills for 3 month supply - discussed risks of chronic BDZ therapy, concerns of dependence, withdrawal, abuse potential. He is aware not to consume alcohol, has not had problem with withdrawal before. Not taking BID every night 2. Anticipate if need to continue Lorazepam will likely have him sign controlled substance contract to continue temporary supply, likely check UDS as well next visit or in future 3. Follow-up q 3 months - future consider alternative agents and other therapy

## 2016-04-17 NOTE — Assessment & Plan Note (Signed)
Likely secondary to chronic anxiety / possible PTSD, on chronic BDZ ativan with improvement, >20 years -See anxiety A&P - Refilled Lorazepam for 3 month supply

## 2016-04-30 ENCOUNTER — Telehealth: Payer: Self-pay | Admitting: Family Medicine

## 2016-04-30 DIAGNOSIS — R600 Localized edema: Secondary | ICD-10-CM

## 2016-04-30 MED ORDER — FUROSEMIDE 20 MG PO TABS: 10.0000 mg | ORAL_TABLET | Freq: Two times a day (BID) | ORAL | 3 refills | Status: DC | PRN

## 2016-04-30 NOTE — Telephone Encounter (Signed)
[facility] back, discussed patient case. She states that they check on patient every few weeks, and he had been doing very well with regards to lower extremity edema, has history of CHF with reduced LVEF on prior ECHO 2015. He had been taking Lasix half tab of 20mg , for dose 10mg  daily as maintenance for edema, now nursing reports he had recently increased salt in diet, and seems to have some mild increased +1 edema bilateral worsening on last check, requesting increase dose of Lasix for short-term. Agree with plan to increase Lasix to one whole tab 20mg  daily for next 3-4 days, and [facility] nursing re-check this Friday. Advised that if he is improving but not resolved to baseline, can increase up to max dose 20mg  twice daily for 3-4 days over weekend, and new rx sent to pharmacy for Lasix 20mg  tabs take dose 10-20mg  up to 2 times daily PRN swelling, #60, +3 refills.  Follow-up in future with Cardiology and consider repeat ECHO as planned.  [name], [facility] 04/30/2016, 11:57 AM

## 2016-04-30 NOTE — Telephone Encounter (Signed)
Charles Ewing with [facility] is concerned because pt has build up of fluid in legs - 1 plus.  Oxygen at rest is 94% and is usually 96-98%.  She can hear faint rales in right lower lobe.  She asked to increase lasix from 10 mg daily to 20 mg for 3 or 4 days and then do a recheck.  Her call back number is [phone]

## 2016-05-27 ENCOUNTER — Encounter (INDEPENDENT_AMBULATORY_CARE_PROVIDER_SITE_OTHER): Payer: Medicare Other | Admitting: Podiatry

## 2016-05-27 NOTE — Progress Notes (Signed)
Patient does not wish to sign ABN sheet, for toenail trim.Charles Kitchen04/16/18  This encounter was created in error - please disregard.

## 2016-06-28 ENCOUNTER — Other Ambulatory Visit: Payer: Self-pay | Admitting: Family Medicine

## 2016-07-07 ENCOUNTER — Encounter: Payer: Self-pay | Admitting: Family Medicine

## 2016-07-18 ENCOUNTER — Telehealth: Payer: Self-pay | Admitting: Family Medicine

## 2016-07-18 DIAGNOSIS — J432 Centrilobular emphysema: Secondary | ICD-10-CM

## 2016-07-18 MED ORDER — FLUTICASONE PROPIONATE (INHAL) 50 MCG/BLIST IN AEPB: 1.0000 | INHALATION_SPRAY | Freq: Two times a day (BID) | RESPIRATORY_TRACT | 12 refills | Status: DC

## 2016-07-18 NOTE — Telephone Encounter (Signed)
Called [facility] back, reviewed course. He has been off Albuterol feeling better from cough, but with history of COPD he states he "needs something" to help his breathing on regular basis. He does not have Pulmonology. On Spiriva but never been on inhaled steroid. Sent new rx Flovent inhaler 1 puff BID daily for maintenance. Follow-up as needed.  [name], [facility] 07/18/2016, 1:20 PM

## 2016-07-18 NOTE — Telephone Encounter (Signed)
[facility] called Charles Ewing [phone]  States that pt went to  ENT  Dr. Tami Ribas 2 weeks ago and was told to  stop taking his  Pro  air to stop him from coughing pt did, but states that sometimes he fill he need something. Charles Ewing wanted to know if something can be replace for the pro Air.

## 2016-08-01 DIAGNOSIS — I499 Cardiac arrhythmia, unspecified: Secondary | ICD-10-CM | POA: Insufficient documentation

## 2016-08-15 ENCOUNTER — Other Ambulatory Visit: Payer: Self-pay | Admitting: Family Medicine

## 2016-08-15 DIAGNOSIS — R109 Unspecified abdominal pain: Secondary | ICD-10-CM

## 2016-08-15 DIAGNOSIS — K5901 Slow transit constipation: Secondary | ICD-10-CM

## 2016-08-27 ENCOUNTER — Ambulatory Visit (INDEPENDENT_AMBULATORY_CARE_PROVIDER_SITE_OTHER): Payer: Medicare Other | Admitting: Family Medicine

## 2016-08-27 ENCOUNTER — Encounter: Payer: Self-pay | Admitting: Family Medicine

## 2016-08-27 VITALS — BP 157/74 | HR 49 | Temp 97.5°F | Resp 16 | Ht 71.0 in | Wt 233.0 lb

## 2016-08-27 DIAGNOSIS — R310 Gross hematuria: Secondary | ICD-10-CM

## 2016-08-27 DIAGNOSIS — M170 Bilateral primary osteoarthritis of knee: Secondary | ICD-10-CM | POA: Diagnosis not present

## 2016-08-27 LAB — POCT URINALYSIS DIPSTICK
Bilirubin, UA: NEGATIVE
GLUCOSE UA: NEGATIVE
Ketones, UA: NEGATIVE
NITRITE UA: NEGATIVE
Protein, UA: NEGATIVE
Spec Grav, UA: 1.015 (ref 1.010–1.025)
pH, UA: 7 (ref 5.0–8.0)

## 2016-08-27 MED ORDER — CEPHALEXIN 500 MG PO CAPS: 500.0000 mg | ORAL_CAPSULE | Freq: Three times a day (TID) | ORAL | 0 refills | Status: DC

## 2016-08-27 NOTE — Patient Instructions (Addendum)
Thank you for coming to the clinic today.  1.  Due to blood in urine, I am concerned that we need to rule out any other serious problem such as a kidney or bladder stone, or possible cancer.  Also could be infection, and we will treat for you for UTI infection now with antibiotic Keflex 500mg  3 times daily for next 7 days  Sent urine for culture - we will change if needed  Last visit Dr Pilar Jarvis - 12/2015, decided to not check PSA anymore  [facility] -1st floor [place],  [place]  [address] Phone: [phone]  ----------------------------------- For the knees, continue topical anti inflammatory medicine as needed  Carlynn Spry, PA - last visit 03/2016  [facility] ([facility] Address: [address], [place], [place] [address] Hours:  9AM-5PM Phone: [phone]  Please schedule a Follow-up Appointment to: Return in about 3 months (around 11/27/2016) for HTN, meds.  If you have any other questions or concerns, please feel free to call the clinic or send a message through [facility]. You may also schedule an earlier appointment if necessary.  Additionally, you may be receiving a survey about your experience at our clinic within a few days to 1 week by e-mail or mail. We value your feedback.  [name], DO [place]

## 2016-08-27 NOTE — Progress Notes (Signed)
Subjective:    Patient ID: Charles Ewing, male    DOB: 15-Jul-1940, 76 y.o.   MRN: [id number]  Charles Ewing is a 76 y.o. male presenting on 08/27/2016 for Hematuria (as per pt  notice bright red  blood in urine twice ---only on saturday )   HPI   GROSS HEMATURIA: - Reports new problem, with initial episode 3 days ago with blood tinged or pink urine, then next void had significant darker gross hematuria, then resolved - Initially had some pelvic discomfort and some initial dysuria but then since resolved - Now voiding regularly, taking lasix regularly as prescribed. - Additional history with L low back and flank pain, for months or weeks before, often worse with movement and twisting to L side was told muscle spasm most likely in past. No clear diagnosis of kidney stone in past - Already established with Urology, [facility], last seen 12/2015 however decided to no longer follow-up PSA regularly, with elevated result in past, additionally back in 2010 had TURP - Active smoker, see below - Denies any reduced urine decreased voiding, abdominal pain, n/v, fever/chills  Left > Right, Chronic Knee Pain / Osteoarthritis: - Previously followed by [facility], followed by Carlynn Spry PA, last visit 03/14/16, injection at that time with good results, he has had prior steroid injections and then synvisc type injections as well. Mixed results. - He cannot take NSAIDs but has been prescribed topical Diclofenac sodium 1% gel using PRN sparingly mostly on L knee, with some improvement - No new worsening knee pain or problem - Denies any new fall or injury, joint swelling, redness  Social History  Substance Use Topics  . Smoking status: Current Every Day Smoker    Packs/day: 0.50  . Smokeless tobacco: Current User  . Alcohol use No    Review of Systems Per HPI unless specifically indicated above     Objective:    BP (!) 157/74   Pulse (!) 49   Temp (!) 97.5 F (36.4 C) (Oral)   Resp 16   Ht  5\' 11"  (1.803 m)   Wt 233 lb (105.7 kg)   BMI 32.50 kg/m   Wt Readings from Last 3 Encounters:  08/27/16 233 lb (105.7 kg)  04/17/16 228 lb (103.4 kg)  12/21/15 230 lb 8 oz (104.6 kg)    Physical Exam  Constitutional: He is oriented to person, place, and time. He appears well-developed and well-nourished. No distress.  Well-appearing, comfortable, cooperative  HENT:  Head: Normocephalic and atraumatic.  Mouth/Throat: Oropharynx is clear and moist.  Eyes: Conjunctivae are normal. Right eye exhibits no discharge. Left eye exhibits no discharge.  Neck: Normal range of motion. Neck supple. No thyromegaly present.  Cardiovascular: Normal rate, regular rhythm, normal heart sounds and intact distal pulses.   No murmur heard. Pulmonary/Chest: Effort normal and breath sounds normal. No respiratory distress. He has no wheezes. He has no rales.  Abdominal: Soft. Bowel sounds are normal. He exhibits no distension. There is no tenderness.  Musculoskeletal: Normal range of motion. He exhibits no edema.  No back pain or CVAT  Bilateral Knees Inspection: Slight bulky appearance and symmetrical. No ecchymosis or effusion. Palpation: Mild tender L knee medial joint line. Mild-moderate crepitus ROM: Full active ROM bilaterally Special Testing: Lachman / Valgus/Varus tests negative with intact ligaments (ACL, MCL, LCL) Strength: 5/5 intact knee flex/ext, ankle dorsi/plantarflex Neurovascular: distally intact sensation light touch and pulses  Lymphadenopathy:    He has no cervical adenopathy.  Neurological:  He is alert and oriented to person, place, and time.  Skin: Skin is warm and dry. No rash noted. He is not diaphoretic. No erythema.  Psychiatric: He has a normal mood and affect. His behavior is normal.  Well groomed, good eye contact, normal speech and thoughts  Nursing note and vitals reviewed.    Results for orders placed or performed in visit on 08/27/16  POCT Urinalysis Dipstick  Result  Value Ref Range   Color, UA amber    Clarity, UA cloudy    Glucose, UA negative    Bilirubin, UA negative    Ketones, UA negative    Spec Grav, UA 1.015 1.010 - 1.025   Blood, UA trace    pH, UA 7.0 5.0 - 8.0   Protein, UA negative    Urobilinogen, UA >=8.0 (A) 0.2 or 1.0 E.U./dL   Nitrite, UA negative    Leukocytes, UA Large (3+) (A) Negative      Assessment & Plan:   Problem List Items Addressed This Visit    Osteoarthritis of knees, bilateral    Subacute on chronic L > R medial knee pain w/o swelling without known injury or trauma - - Followed by [facility], with known knee OA/DJD - Possible concern ligamentous injury maybe degenerative meniscus if not improving with therapies, s/p steroid and synvisc injections - Able to bear weight, no knee instability or mechanical locking - No prior history of knee surgery, arthroscopy  Plan: 1. Continue topical Diclofenac, Tylenol, RICE therapy (rest, ice, compression, elevation) for swelling, activity modification 2. No new injury, defer repeat X-rays 3. Advised to return to [facility] follow-up with continued knee problems, eventually he may need surgical intervention and replacement 4. Follow-up PRN       Other Visit Diagnoses    Gross hematuria    -  Primary  Clinically with reported gross hematuria without prior history of similar episodes  - Supported by UA today also concern for potential UTI based on UA - Concern for significant pathology, and cannot rule out potential malignancy (kidney, bladder) increased risk due to smoking history  - Differential includes more likely options of UTI, Kidney stone  Plan: 1. Treat empirically with antibiotics for UTI and pending urine culture 2. Follow-up urine culture results 3. Referral to [facility] for evaluation of gross hematuria - discussion today that he would likely need further work-up with imaging, CT hematuria protocol, and cystoscopy to potentially  rule out malignancy or other serious pathology given gross hematuria     Relevant Medications   cephALEXin (KEFLEX) 500 MG capsule   Other Relevant Orders   POCT Urinalysis Dipstick (Completed)   CULTURE, URINE COMPREHENSIVE      Meds ordered this encounter  Medications  . cephALEXin (KEFLEX) 500 MG capsule    Sig: Take 1 capsule (500 mg total) by mouth 3 (three) times daily. For 7 days    Dispense:  21 capsule    Refill:  0      Follow up plan: Return in about 3 months (around 11/27/2016) for HTN, meds.  [name], [facility] 08/28/2016, 1:58 AM

## 2016-08-28 NOTE — Assessment & Plan Note (Signed)
Subacute on chronic L > R medial knee pain w/o swelling without known injury or trauma - - Followed by [facility], with known knee OA/DJD - Possible concern ligamentous injury maybe degenerative meniscus if not improving with therapies, s/p steroid and synvisc injections - Able to bear weight, no knee instability or mechanical locking - No prior history of knee surgery, arthroscopy  Plan: 1. Continue topical Diclofenac, Tylenol, RICE therapy (rest, ice, compression, elevation) for swelling, activity modification 2. No new injury, defer repeat X-rays 3. Advised to return to [facility] follow-up with continued knee problems, eventually he may need surgical intervention and replacement 4. Follow-up PRN

## 2016-08-29 LAB — CULTURE, URINE COMPREHENSIVE: Organism ID, Bacteria: NO GROWTH

## 2016-09-03 ENCOUNTER — Ambulatory Visit (INDEPENDENT_AMBULATORY_CARE_PROVIDER_SITE_OTHER): Payer: Medicare Other | Admitting: Urology

## 2016-09-03 ENCOUNTER — Encounter: Payer: Self-pay | Admitting: Urology

## 2016-09-03 VITALS — BP 102/67 | HR 69 | Ht 71.0 in | Wt 233.0 lb

## 2016-09-03 DIAGNOSIS — R310 Gross hematuria: Secondary | ICD-10-CM | POA: Diagnosis not present

## 2016-09-03 LAB — URINALYSIS, COMPLETE
Bilirubin, UA: NEGATIVE
Glucose, UA: NEGATIVE
Ketones, UA: NEGATIVE
NITRITE UA: NEGATIVE
Protein, UA: NEGATIVE
RBC, UA: NEGATIVE
Specific Gravity, UA: 1.02 (ref 1.005–1.030)
UUROB: 1 mg/dL (ref 0.2–1.0)
pH, UA: 6 (ref 5.0–7.5)

## 2016-09-03 LAB — MICROSCOPIC EXAMINATION
Bacteria, UA: NONE SEEN
EPITHELIAL CELLS (NON RENAL): NONE SEEN /HPF (ref 0–10)
RBC MICROSCOPIC, UA: NONE SEEN /HPF (ref 0–?)

## 2016-09-03 NOTE — Progress Notes (Signed)
09/03/2016 9:19 AM   Charles Ewing Jun 29, 1940 [id number]  Referring provider: Olin Hauser, DO [address] [place], [place] [address]  Chief Complaint  Patient presents with  . Hematuria    HPI: 76 year old male with a host of comorbidities who we have seen in the past fo an elevated PSAand negative  Who presents today for a new problem of gross hematuria.  The patient has a history of TURP in 2010. Since that time he has had no significant progression of his urinary tract symptoms. Specifically, he has never had hematuria before. However,  He noted pink urine 10 days ago. This subsequently developed into gross hematuria, and then quickly stopped. He had no associated dysuria. He was started on antibiotics initially by his primary care provider but then called and told to stop them as urine culture was negative. The patient denies any progression of his urinary tract symptoms including frequency or urgency. He denies any suprapubic pain. He denies any prostatitis symptoms.  PMH: Past Medical History:  Diagnosis Date  . Abdominal aortic aneurysm (AAA) ([place]) 2013   found at [facility]-  . Anxiety   . Arrhythmia   . Arthritis   . Colon polyp   . COPD (chronic obstructive pulmonary disease) ([place])   . Coronary artery disease   . Depression   . GERD (gastroesophageal reflux disease)   . Hyperlipidemia   . Hypertension   . Skin cancer   . Thrush     Surgical History: Past Surgical History:  Procedure Laterality Date  . Colon polyp surgery    . SKIN CANCER EXCISION    . TRANSURETHRAL RESECTION OF PROSTATE  2010    Home Medications:  Allergies as of 09/03/2016   No Known Allergies     Medication List       Accurate as of 09/03/16  9:19 AM. Always use your most recent med list.          albuterol 108 (90 Base) MCG/ACT inhaler Commonly known as:  PROVENTIL HFA;VENTOLIN HFA Inhale 2 puffs into the lungs every 6 (six) hours as needed for wheezing or shortness of  breath.   aspirin EC 81 MG tablet Take 81 mg by mouth every evening.   diclofenac sodium 1 % Gel Commonly known as:  VOLTAREN   FLUoxetine 20 MG capsule Commonly known as:  PROZAC Take 20 mg by mouth 2 (two) times daily.   fluticasone 50 MCG/ACT nasal spray Commonly known as:  FLONASE Place 2 sprays into both nostrils daily.   fluticasone 50 MCG/BLIST diskus inhaler Commonly known as:  FLOVENT DISKUS Inhale 1 puff into the lungs 2 (two) times daily.   furosemide 20 MG tablet Commonly known as:  LASIX Take 0.5-1 tablets (10-20 mg total) by mouth 2 (two) times daily as needed.   glucose blood test strip Check blood glucose three times weekly.   loratadine 10 MG tablet Commonly known as:  CLARITIN Take 10 mg by mouth every evening.   LORazepam 0.5 MG tablet Commonly known as:  ATIVAN Take 1 tablet (0.5 mg total) by mouth every 12 (twelve) hours as needed for anxiety.   Medical Compression Stockings Misc 1 each by Does not apply route daily.   metoprolol succinate 50 MG 24 hr tablet Commonly known as:  TOPROL-XL Take 1 tablet (50 mg total) by mouth daily. Take with or immediately following a meal.   ONE TOUCH ULTRA SYSTEM KIT w/Device Kit 1 kit by Does not apply route once.  CHeck blood glucose in the AM three times weekly.   onetouch ultrasoft lancets Use as instructed   polyethylene glycol powder powder Commonly known as:  GLYCOLAX/MIRALAX USE HALF UP TO 1 CAPFUL BY MOUTH DAILY FOR MAINTENANCE, IF ACUTE WORSENING CONSTIPATION CAN INCREASE TO 2 CAPFULS DAILY OR 1 CAPFUL TWICE DA   potassium chloride SA 20 MEQ tablet Commonly known as:  K-DUR,KLOR-CON TAKE 1 TABLET BY MOUTH EVERY DAY.   simvastatin 20 MG tablet Commonly known as:  ZOCOR Take 20 mg by mouth at bedtime.   tamsulosin 0.4 MG Caps capsule Commonly known as:  FLOMAX Take 1 capsule (0.4 mg total) by mouth daily. Take one capsule by mouth daily   tiotropium 18 MCG inhalation capsule Commonly known  as:  SPIRIVA HANDIHALER Place 1 capsule (18 mcg total) into inhaler and inhale daily.       Allergies: No Known Allergies  Family History: Family History  Problem Relation Age of Onset  . Kidney cancer Mother   . Prostate cancer Neg Hx   . Bladder Cancer Neg Hx     Social History:  reports that he has been smoking.  He has been smoking about 0.50 packs per day. He uses smokeless tobacco. He reports that he does not drink alcohol or use drugs.  ROS: UROLOGY Frequent Urination?: No Hard to postpone urination?: No Burning/pain with urination?: No Get up at night to urinate?: No Leakage of urine?: No Urine stream starts and stops?: No Trouble starting stream?: No Do you have to strain to urinate?: No Blood in urine?: Yes Urinary tract infection?: No Sexually transmitted disease?: No Injury to kidneys or bladder?: No Painful intercourse?: No Weak stream?: No Erection problems?: No Penile pain?: No  Gastrointestinal Nausea?: No Vomiting?: No Indigestion/heartburn?: Yes Diarrhea?: No Constipation?: No  Constitutional Fever: No Night sweats?: No Weight loss?: No Fatigue?: No  Skin Skin rash/lesions?: No Itching?: No  Eyes Blurred vision?: No Double vision?: No  Ears/Nose/Throat Sore throat?: No Sinus problems?: No  Hematologic/Lymphatic Swollen glands?: No Easy bruising?: Yes  Cardiovascular Leg swelling?: Yes Chest pain?: No  Respiratory Cough?: Yes Shortness of breath?: Yes  Endocrine Excessive thirst?: No  Musculoskeletal Back pain?: Yes Joint pain?: Yes  Neurological Headaches?: Yes Dizziness?: Yes  Psychologic Depression?: Yes Anxiety?: Yes  Physical Exam: BP 102/67   Pulse 69   Ht '5\' 11"'  (1.803 m)   Wt 105.7 kg (233 lb)   BMI 32.50 kg/m   Constitutional:  Alert and oriented, No acute distress. HEENT: [place] AT, moist mucus membranes.  Trachea midline, no masses. Cardiovascular: No clubbing, cyanosis, or edema. Respiratory:  Normal respiratory effort, no increased work of breathing. GI: Abdomen is soft, nontender, nondistended, no abdominal masses GU: No CVA tenderness. Skin: No rashes, bruises or suspicious lesions. Lymph: No cervical or inguinal adenopathy. Neurologic: Grossly intact, no focal deficits, moving all 4 extremities. Psychiatric: Normal mood and affect.  Laboratory Data: Lab Results  Component Value Date   WBC 9.0 11/17/2015   HGB 15.9 11/17/2015   HCT 48.5 11/17/2015   MCV 92.7 11/17/2015   PLT 209 11/17/2015    Lab Results  Component Value Date   CREATININE 1.06 11/17/2015    No results found for: PSA  No results found for: TESTOSTERONE  Lab Results  Component Value Date   HGBA1C 5.9 11/17/2015    Urinalysis    Component Value Date/Time   COLORURINE YELLOW (A) 03/01/2015 0235   APPEARANCEUR Clear 06/19/2015 1413   LABSPEC 1.013 03/01/2015  0235   LABSPEC 1.015 10/15/2013 0912   PHURINE 6.0 03/01/2015 0235   GLUCOSEU Negative 06/19/2015 1413   GLUCOSEU Negative 10/15/2013 0912   HGBUR NEGATIVE 03/01/2015 0235   BILIRUBINUR negative 08/27/2016 1405   BILIRUBINUR Negative 06/19/2015 1413   BILIRUBINUR Negative 10/15/2013 0912   KETONESUR NEGATIVE 03/01/2015 0235   PROTEINUR negative 08/27/2016 1405   PROTEINUR Negative 06/19/2015 1413   PROTEINUR NEGATIVE 03/01/2015 0235   UROBILINOGEN >=8.0 (A) 08/27/2016 1405   NITRITE negative 08/27/2016 1405   NITRITE Negative 06/19/2015 1413   NITRITE NEGATIVE 03/01/2015 0235   LEUKOCYTESUR Large (3+) (A) 08/27/2016 1405   LEUKOCYTESUR 1+ (A) 06/19/2015 1413   LEUKOCYTESUR Trace 10/15/2013 0912    Pertinent Imaging: None  Procedure: After informed consent was obtained the patient was prepped and draped in the supine position. Using a 37 French flexible cystoscope by visually guided the scope into the patient's bladder. I performed cystoscopy in the routine fashion noting no abnormal appearing bladder mucosa. The ureters were  orthotopic. There were no tumors, stones, or mucosal abnormalities. Pulling back the scope the prostatic fossa was noted to be previously resected with some lateral regrowth. The bladder neck was mildly contracted. At the apex of the prostatic urethra there were some varices, likely the etiology of the patient's bleeding. Otherwise, the urethra was normal appearing.  Assessment & Plan:  The patient presented with gross hematuria, asymptomatic. This is likely coming from the patient's prostate, however I did recommend that we proceed with a complete evaluation and have ordered a CT urogram. 1. Gross hematuria  Follow up after CT urogram.  - Urinalysis, Complete   No Follow-up on file.  Ardis Hughs, [facility] [address], [place], [place] [address] [phone]

## 2016-09-10 ENCOUNTER — Other Ambulatory Visit: Payer: Self-pay | Admitting: Nurse Practitioner

## 2016-09-16 ENCOUNTER — Telehealth: Payer: Self-pay | Admitting: Family Medicine

## 2016-09-16 ENCOUNTER — Other Ambulatory Visit: Payer: Self-pay | Admitting: Urology

## 2016-09-16 DIAGNOSIS — B370 Candidal stomatitis: Secondary | ICD-10-CM

## 2016-09-16 DIAGNOSIS — Z7951 Long term (current) use of inhaled steroids: Secondary | ICD-10-CM

## 2016-09-16 MED ORDER — NYSTATIN 100000 UNIT/ML MT SUSP
5.0000 mL | Freq: Four times a day (QID) | OROMUCOSAL | 3 refills | Status: DC
Start: 2016-09-16 — End: 2017-11-04

## 2016-09-16 NOTE — Telephone Encounter (Signed)
I have sent new rx for Nystatin suspension 37mL QID #473 mL bottle +3 refills for chronic use of inhaled steroid and recurrence of oral thrush if not using antifungal.  Sent to [place].  [name], [facility] 09/16/2016, 12:34 PM

## 2016-09-16 NOTE — Telephone Encounter (Signed)
Charles Ewing at [facility] said pt needs a refill on nystatin oral suspension for sore mouth.  His inhaler causes his mouth to be sore even rinsing mouth after use.  Her call back number is [phone]

## 2016-09-17 ENCOUNTER — Other Ambulatory Visit: Payer: Self-pay

## 2016-09-17 ENCOUNTER — Ambulatory Visit
Admission: RE | Admit: 2016-09-17 | Discharge: 2016-09-17 | Disposition: A | Discharge status: Home / Self Care | Payer: Medicare Other | Source: Ambulatory Visit | Attending: Urology | Admitting: Urology

## 2016-09-17 DIAGNOSIS — I700 Atherosclerosis of aorta: Secondary | ICD-10-CM | POA: Diagnosis not present

## 2016-09-17 DIAGNOSIS — R310 Gross hematuria: Secondary | ICD-10-CM | POA: Diagnosis present

## 2016-09-17 DIAGNOSIS — N200 Calculus of kidney: Secondary | ICD-10-CM | POA: Diagnosis not present

## 2016-09-17 DIAGNOSIS — N400 Enlarged prostate without lower urinary tract symptoms: Secondary | ICD-10-CM | POA: Diagnosis not present

## 2016-09-17 DIAGNOSIS — K439 Ventral hernia without obstruction or gangrene: Secondary | ICD-10-CM | POA: Diagnosis not present

## 2016-09-17 DIAGNOSIS — N401 Enlarged prostate with lower urinary tract symptoms: Secondary | ICD-10-CM

## 2016-09-17 MED ORDER — IOPAMIDOL (ISOVUE-300) INJECTION 61%
125.0000 mL | Freq: Once | INTRAVENOUS | Status: AC | PRN
  Administered 2016-09-17: 125 mL via INTRAVENOUS

## 2016-09-17 MED ORDER — TAMSULOSIN HCL 0.4 MG PO CAPS: 0.4000 mg | ORAL_CAPSULE | Freq: Every day | ORAL | 11 refills | Status: DC

## 2016-09-24 ENCOUNTER — Ambulatory Visit (INDEPENDENT_AMBULATORY_CARE_PROVIDER_SITE_OTHER): Payer: Medicare Other | Admitting: Urology

## 2016-09-24 ENCOUNTER — Encounter: Payer: Self-pay | Admitting: Urology

## 2016-09-24 VITALS — BP 90/60 | HR 69 | Ht 71.0 in | Wt 232.9 lb

## 2016-09-24 DIAGNOSIS — N400 Enlarged prostate without lower urinary tract symptoms: Secondary | ICD-10-CM | POA: Diagnosis not present

## 2016-09-24 DIAGNOSIS — R310 Gross hematuria: Secondary | ICD-10-CM | POA: Diagnosis not present

## 2016-09-24 NOTE — Progress Notes (Signed)
09/24/2016 10:23 AM   Charles Ewing 1941/01/17 [id number]  Referring provider: Olin Hauser, DO [address] [place], [place] [address]  Chief Complaint  Patient presents with  . Follow-up    CT scan results    HPI: 1) Gross hematuria  - undewent cysto 09/03/2016 - benign and CT 09/17/2016 which was benign.   2) BPH - The patient has a history of TURP in 2010. Prostate ~90 g with left calcifications on CT Aug 2018. Since that time he has had no significant progression of his urinary tract symptoms. The patient denies any progression of his urinary tract symptoms including frequency or urgency. He denies any suprapubic pain. He denies any prostatitis symptoms.  3) PSA elevation - The patient's most recent PSA was 7.9 in April 2017. Based on the patient's records there is report of the patient having a PSA of 8.8 in January 2017. There are no additional reports her lab results available. The patient is a [place] patient. He tells me that he has had 3 negative prostate biopsies in the past. Repeat PSA in November 2017 was 5.7.  Today, patient is seen for the above.He's had no further gross hematuria.   PMH: Past Medical History:  Diagnosis Date  . Abdominal aortic aneurysm (AAA) ([place]) 2013   found at [facility]  . Anxiety   . Arrhythmia   . Arthritis   . Colon polyp   . COPD (chronic obstructive pulmonary disease) ([place])   . Coronary artery disease   . Depression   . GERD (gastroesophageal reflux disease)   . Hyperlipidemia   . Hypertension   . Skin cancer   . Thrush     Surgical History: Past Surgical History:  Procedure Laterality Date  . Colon polyp surgery    . SKIN CANCER EXCISION    . TRANSURETHRAL RESECTION OF PROSTATE  2010    Home Medications:  Allergies as of 09/24/2016   No Known Allergies     Medication List       Accurate as of 09/24/16 10:23 AM. Always use your most recent med list.          albuterol 108 (90 Base) MCG/ACT inhaler Commonly known as:   PROVENTIL HFA;VENTOLIN HFA Inhale 2 puffs into the lungs every 6 (six) hours as needed for wheezing or shortness of breath.   aspirin EC 81 MG tablet Take 81 mg by mouth every evening.   diclofenac sodium 1 % Gel Commonly known as:  VOLTAREN   FLUoxetine 20 MG capsule Commonly known as:  PROZAC Take 20 mg by mouth 2 (two) times daily.   fluticasone 50 MCG/ACT nasal spray Commonly known as:  FLONASE Place 2 sprays into both nostrils daily.   fluticasone 50 MCG/BLIST diskus inhaler Commonly known as:  FLOVENT DISKUS Inhale 1 puff into the lungs 2 (two) times daily.   furosemide 20 MG tablet Commonly known as:  LASIX Take 0.5-1 tablets (10-20 mg total) by mouth 2 (two) times daily as needed.   glucose blood test strip Check blood glucose three times weekly.   loratadine 10 MG tablet Commonly known as:  CLARITIN Take 10 mg by mouth every evening.   LORazepam 0.5 MG tablet Commonly known as:  ATIVAN Take 1 tablet (0.5 mg total) by mouth every 12 (twelve) hours as needed for anxiety.   Medical Compression Stockings Misc 1 each by Does not apply route daily.   metoprolol succinate 50 MG 24 hr tablet Commonly known as:  TOPROL-XL Take 1 tablet (50 mg total) by mouth daily. Take with or immediately following a meal.   nystatin 100000 UNIT/ML suspension Commonly known as:  MYCOSTATIN Take 5 mLs (500,000 Units total) by mouth 4 (four) times daily.   ONE TOUCH ULTRA SYSTEM KIT w/Device Kit 1 kit by Does not apply route once. CHeck blood glucose in the AM three times weekly.   onetouch ultrasoft lancets Use as instructed   polyethylene glycol powder powder Commonly known as:  GLYCOLAX/MIRALAX USE HALF UP TO 1 CAPFUL BY MOUTH DAILY FOR MAINTENANCE, IF ACUTE WORSENING CONSTIPATION CAN INCREASE TO 2 CAPFULS DAILY OR 1 CAPFUL TWICE DA   potassium chloride SA 20 MEQ tablet Commonly known as:  K-DUR,KLOR-CON TAKE 1 TABLET BY MOUTH EVERY DAY.   simvastatin 20 MG  tablet Commonly known as:  ZOCOR Take 20 mg by mouth at bedtime.   tamsulosin 0.4 MG Caps capsule Commonly known as:  FLOMAX Take 1 capsule (0.4 mg total) by mouth daily. Take one capsule by mouth daily   tiotropium 18 MCG inhalation capsule Commonly known as:  SPIRIVA HANDIHALER Place 1 capsule (18 mcg total) into inhaler and inhale daily.       Allergies: No Known Allergies  Family History: Family History  Problem Relation Age of Onset  . Kidney cancer Mother   . Prostate cancer Neg Hx   . Bladder Cancer Neg Hx     Social History:  reports that he has been smoking.  He has been smoking about 0.50 packs per day. He uses smokeless tobacco. He reports that he does not drink alcohol or use drugs.  ROS: UROLOGY Frequent Urination?: No Hard to postpone urination?: No Burning/pain with urination?: No Get up at night to urinate?: No Leakage of urine?: No Urine stream starts and stops?: No Trouble starting stream?: No Do you have to strain to urinate?: No Blood in urine?: Yes Urinary tract infection?: No Sexually transmitted disease?: No Injury to kidneys or bladder?: No Painful intercourse?: No Weak stream?: No Erection problems?: No Penile pain?: No  Gastrointestinal Nausea?: No Vomiting?: No Indigestion/heartburn?: No Diarrhea?: No Constipation?: No  Constitutional Fever: No Night sweats?: No Weight loss?: No Fatigue?: No  Skin Skin rash/lesions?: No Itching?: No  Eyes Blurred vision?: No Double vision?: No  Ears/Nose/Throat Sore throat?: No Sinus problems?: No  Hematologic/Lymphatic Swollen glands?: No Easy bruising?: No  Cardiovascular Leg swelling?: No Chest pain?: No  Respiratory Cough?: No Shortness of breath?: No  Endocrine Excessive thirst?: No  Musculoskeletal Back pain?: No Joint pain?: No  Neurological Headaches?: No Dizziness?: No  Psychologic Depression?: No Anxiety?: No  Physical Exam: BP 90/60 (BP Location:  Left Arm, Patient Position: Sitting, Cuff Size: Normal)   Pulse 69   Ht '5\' 11"'  (1.803 m)   Wt 105.6 kg (232 lb 14.4 oz)   BMI 32.48 kg/m   Constitutional:  Alert and oriented, No acute distress. HEENT: [place] AT, moist mucus membranes.  Trachea midline, no masses. Cardiovascular: No clubbing, cyanosis, or edema. Respiratory: Normal respiratory effort, no increased work of breathing. GI: Abdomen is soft, nontender, nondistended, no abdominal masses GU: No CVA tenderness.  DRE: 40 g , smooth, no hard area or nodule  Skin: No rashes, bruises or suspicious lesions. Lymph: No cervical or inguinal adenopathy. Neurologic: Grossly intact, no focal deficits, moving all 4 extremities. Psychiatric: Normal mood and affect.  Laboratory Data: Lab Results  Component Value Date   WBC 9.0 11/17/2015   HGB 15.9 11/17/2015  HCT 48.5 11/17/2015   MCV 92.7 11/17/2015   PLT 209 11/17/2015    Lab Results  Component Value Date   CREATININE 1.06 11/17/2015    No results found for: PSA  No results found for: TESTOSTERONE  Lab Results  Component Value Date   HGBA1C 5.9 11/17/2015    Urinalysis    Component Value Date/Time   COLORURINE YELLOW (A) 03/01/2015 0235   APPEARANCEUR Clear 09/03/2016 0828   LABSPEC 1.013 03/01/2015 0235   LABSPEC 1.015 10/15/2013 0912   PHURINE 6.0 03/01/2015 0235   GLUCOSEU Negative 09/03/2016 0828   GLUCOSEU Negative 10/15/2013 0912   HGBUR NEGATIVE 03/01/2015 0235   BILIRUBINUR Negative 09/03/2016 0828   BILIRUBINUR Negative 10/15/2013 0912   KETONESUR NEGATIVE 03/01/2015 0235   PROTEINUR Negative 09/03/2016 0828   PROTEINUR NEGATIVE 03/01/2015 0235   UROBILINOGEN >=8.0 (A) 08/27/2016 1405   NITRITE Negative 09/03/2016 0828   NITRITE NEGATIVE 03/01/2015 0235   LEUKOCYTESUR 1+ (A) 09/03/2016 0828   LEUKOCYTESUR Trace 10/15/2013 0912    Pertinent Imaging: CT Assessment & Plan:    1) gross hematuria - resolved  2) BPH -stable. DRE today benign.    F/u 1 year for UA, H&P.   There are no diagnoses linked to this encounter.  No Follow-up on file.  Festus Aloe, [facility] [address] [place], [place] [address] [phone]

## 2016-11-11 ENCOUNTER — Other Ambulatory Visit: Payer: Self-pay | Admitting: Family Medicine

## 2016-11-11 DIAGNOSIS — F99 Mental disorder, not otherwise specified: Secondary | ICD-10-CM

## 2016-11-11 DIAGNOSIS — F419 Anxiety disorder, unspecified: Secondary | ICD-10-CM

## 2016-11-11 DIAGNOSIS — F5105 Insomnia due to other mental disorder: Secondary | ICD-10-CM

## 2016-11-12 ENCOUNTER — Other Ambulatory Visit: Payer: Self-pay | Admitting: Family Medicine

## 2016-11-12 DIAGNOSIS — F99 Mental disorder, not otherwise specified: Secondary | ICD-10-CM

## 2016-11-12 DIAGNOSIS — F5105 Insomnia due to other mental disorder: Secondary | ICD-10-CM

## 2016-11-12 DIAGNOSIS — F419 Anxiety disorder, unspecified: Secondary | ICD-10-CM

## 2016-11-13 ENCOUNTER — Other Ambulatory Visit: Payer: Self-pay | Admitting: Family Medicine

## 2016-11-13 DIAGNOSIS — F99 Mental disorder, not otherwise specified: Secondary | ICD-10-CM

## 2016-11-13 DIAGNOSIS — F5105 Insomnia due to other mental disorder: Secondary | ICD-10-CM

## 2016-11-13 DIAGNOSIS — F419 Anxiety disorder, unspecified: Secondary | ICD-10-CM

## 2016-11-25 ENCOUNTER — Ambulatory Visit (INDEPENDENT_AMBULATORY_CARE_PROVIDER_SITE_OTHER): Payer: Medicare Other

## 2016-11-25 DIAGNOSIS — Z23 Encounter for immunization: Secondary | ICD-10-CM

## 2017-02-05 LAB — HEMOGLOBIN A1C: Hemoglobin A1C: 6.4

## 2017-02-07 ENCOUNTER — Encounter: Payer: Self-pay | Admitting: Family Medicine

## 2017-02-07 ENCOUNTER — Other Ambulatory Visit: Payer: Self-pay

## 2017-02-07 DIAGNOSIS — E119 Type 2 diabetes mellitus without complications: Secondary | ICD-10-CM

## 2017-02-07 MED ORDER — GLUCOSE BLOOD VI STRP: ORAL_STRIP | 12 refills | Status: DC

## 2017-02-07 MED ORDER — ONETOUCH ULTRASOFT LANCETS MISC: 12 refills | Status: DC

## 2017-02-10 ENCOUNTER — Telehealth: Payer: Self-pay | Admitting: Family Medicine

## 2017-02-10 NOTE — Telephone Encounter (Signed)
Charles Ewing with [name] needs a verbal to continue home health nursing services [phone]

## 2017-02-12 DIAGNOSIS — I10 Essential (primary) hypertension: Secondary | ICD-10-CM | POA: Diagnosis not present

## 2017-02-12 DIAGNOSIS — I2510 Atherosclerotic heart disease of native coronary artery without angina pectoris: Secondary | ICD-10-CM | POA: Diagnosis not present

## 2017-02-12 DIAGNOSIS — I714 Abdominal aortic aneurysm, without rupture: Secondary | ICD-10-CM | POA: Diagnosis not present

## 2017-02-12 DIAGNOSIS — J449 Chronic obstructive pulmonary disease, unspecified: Secondary | ICD-10-CM | POA: Diagnosis not present

## 2017-02-12 DIAGNOSIS — E785 Hyperlipidemia, unspecified: Secondary | ICD-10-CM | POA: Diagnosis not present

## 2017-02-12 NOTE — Telephone Encounter (Signed)
Verbal was given. 

## 2017-02-12 NOTE — Telephone Encounter (Signed)
Left message for patient to call back  

## 2017-02-13 ENCOUNTER — Other Ambulatory Visit: Payer: Self-pay

## 2017-02-13 DIAGNOSIS — F419 Anxiety disorder, unspecified: Secondary | ICD-10-CM

## 2017-02-13 DIAGNOSIS — F99 Mental disorder, not otherwise specified: Secondary | ICD-10-CM

## 2017-02-13 DIAGNOSIS — F5105 Insomnia due to other mental disorder: Secondary | ICD-10-CM

## 2017-02-18 DIAGNOSIS — I2510 Atherosclerotic heart disease of native coronary artery without angina pectoris: Secondary | ICD-10-CM | POA: Diagnosis not present

## 2017-02-18 DIAGNOSIS — I10 Essential (primary) hypertension: Secondary | ICD-10-CM | POA: Diagnosis not present

## 2017-02-18 DIAGNOSIS — R609 Edema, unspecified: Secondary | ICD-10-CM | POA: Diagnosis not present

## 2017-02-18 DIAGNOSIS — N200 Calculus of kidney: Secondary | ICD-10-CM | POA: Diagnosis not present

## 2017-02-18 DIAGNOSIS — E785 Hyperlipidemia, unspecified: Secondary | ICD-10-CM | POA: Diagnosis not present

## 2017-02-18 DIAGNOSIS — K219 Gastro-esophageal reflux disease without esophagitis: Secondary | ICD-10-CM | POA: Diagnosis not present

## 2017-02-18 DIAGNOSIS — Z7982 Long term (current) use of aspirin: Secondary | ICD-10-CM | POA: Diagnosis not present

## 2017-02-18 DIAGNOSIS — E1165 Type 2 diabetes mellitus with hyperglycemia: Secondary | ICD-10-CM | POA: Diagnosis not present

## 2017-02-18 DIAGNOSIS — I499 Cardiac arrhythmia, unspecified: Secondary | ICD-10-CM | POA: Diagnosis not present

## 2017-02-18 DIAGNOSIS — J449 Chronic obstructive pulmonary disease, unspecified: Secondary | ICD-10-CM | POA: Diagnosis not present

## 2017-02-19 ENCOUNTER — Ambulatory Visit (INDEPENDENT_AMBULATORY_CARE_PROVIDER_SITE_OTHER): Payer: Medicare Other | Admitting: Family Medicine

## 2017-02-19 ENCOUNTER — Encounter: Payer: Self-pay | Admitting: Family Medicine

## 2017-02-19 VITALS — BP 128/77 | HR 59 | Temp 97.7°F | Resp 16 | Ht 71.0 in | Wt 231.4 lb

## 2017-02-19 DIAGNOSIS — F5105 Insomnia due to other mental disorder: Secondary | ICD-10-CM

## 2017-02-19 DIAGNOSIS — J432 Centrilobular emphysema: Secondary | ICD-10-CM

## 2017-02-19 DIAGNOSIS — I951 Orthostatic hypotension: Secondary | ICD-10-CM

## 2017-02-19 DIAGNOSIS — I10 Essential (primary) hypertension: Secondary | ICD-10-CM

## 2017-02-19 DIAGNOSIS — F99 Mental disorder, not otherwise specified: Secondary | ICD-10-CM

## 2017-02-19 DIAGNOSIS — R7303 Prediabetes: Secondary | ICD-10-CM

## 2017-02-19 DIAGNOSIS — I714 Abdominal aortic aneurysm, without rupture, unspecified: Secondary | ICD-10-CM

## 2017-02-19 DIAGNOSIS — F419 Anxiety disorder, unspecified: Secondary | ICD-10-CM

## 2017-02-19 DIAGNOSIS — I25118 Atherosclerotic heart disease of native coronary artery with other forms of angina pectoris: Secondary | ICD-10-CM

## 2017-02-19 DIAGNOSIS — R600 Localized edema: Secondary | ICD-10-CM | POA: Diagnosis not present

## 2017-02-19 MED ORDER — LORAZEPAM 0.5 MG PO TABS: 0.5000 mg | ORAL_TABLET | Freq: Every evening | ORAL | 2 refills | Status: DC | PRN

## 2017-02-19 NOTE — Assessment & Plan Note (Signed)
Chronic CAD Stable without worsening angina Continue ASA, BB, Statin Follow-up with [facility] as planned

## 2017-02-19 NOTE — Assessment & Plan Note (Signed)
Likely secondary to chronic anxiety / possible PTSD, on chronic BDZ ativan with improvement, >20 years -See anxiety A&P - Refilled Lorazepam for 3 month supply

## 2017-02-19 NOTE — Assessment & Plan Note (Signed)
Improved HTN control, concern with low reading orthostasis - Home BP readings reviewed per [facility] nursing  Complication with orthostatic hypotension, CAD, AAA   Plan:  1. Continue current BP regimen - Metoprolol XL 50mg  daily - DC'd Lasix 2. Encourage improved lifestyle - low sodium diet, regular exercise 3. Continue monitor BP outside office, bring readings to next visit, if persistently >140/90 or new symptoms notify office sooner

## 2017-02-19 NOTE — Assessment & Plan Note (Signed)
Resolved Not indication for lasix Again counseling, DC'd lasix

## 2017-02-19 NOTE — Assessment & Plan Note (Signed)
Recent orthostatic hypotension likely 2/2 daily lasix 10mg  without edema Previously he was DC'd on lasix by me back in 11/2015, but has resumed it since Also on Flomax likely contributing  Plan: 1. Discontinue Lasix 10mg  daily (half of 20mg  tab) - he may keep existing bottle just in case has any significant swelling or acute flare, may take but if he takes any needs to contact office or cardiology

## 2017-02-19 NOTE — Assessment & Plan Note (Signed)
Stable chronic problem >20 years, on BDZ, by history seems like prior history of possible PTSD contributing, with poor sleep - No long established with Psychiatry  Plan 1. Refilled chronic Lorazepam 0.5mg  1 time nightly PRN insomnia #30, +2 refills for 3 month supply - discussed risks of chronic BDZ therapy, concerns of dependence, withdrawal, abuse potential. He is aware not to consume alcohol, has not had problem with withdrawal before. - Advised against BID use - [place] Controlled substance signed - He was unable to leave urine sample today for microalbumin and UDS - if unable again to leave 2nd sample will no longer be able to rx Lorazepam 2. Follow-up q 3 months - future consider alternative agents and other therapy

## 2017-02-19 NOTE — Assessment & Plan Note (Signed)
Elevated A1c to 6.4, concern at risk of progression to DM2, questionable history if had A1c >6.5 in past Concern with obesity, HTN, HLD  Plan:  1. Not on any therapy currently  2. Encourage improved lifestyle - low carb, low sugar diet, reduce portion size, continue improving regular exercise

## 2017-02-19 NOTE — Assessment & Plan Note (Signed)
Stable without flare Controlled on Flovent Has Albuterol PRN not using regularly

## 2017-02-19 NOTE — Assessment & Plan Note (Addendum)
Stable AAA per [facility] Defer imaging repeat at this time Continue BB

## 2017-02-19 NOTE — Progress Notes (Signed)
Subjective:    Patient ID: Charles Ewing, male    DOB: [birth year], 77 y.o.   MRN: [id number]  Charles Ewing is a 77 y.o. male presenting on 02/19/2017 for Hypertension   HPI   CHRONIC HTN / History of Orthostatic Hypotension Reports concern recently with low BP especially upon standing with dramatic drop, HH Nursing provides written request today to review meds, they were concerned about his Lasix as he was taking half tab of 20mg  daily and not PRN despite not having swelling, this was stopped Current Meds - Metoprolol XL 50mg  daily   Reports good compliance, took meds today. Tolerating well, w/o complaints. Resolved dizziness and lightheadedness after stop lasix Denies CP, dyspnea, HA, edema  CAD, history of AAA, history of mild reduced LVEF - Followed by [facility] Dr Saralyn Pilar, he was seen recently 02/12/17, reviewed prior Stress test, LVEF mild reduce 49%, history of mild inferior wall ischemia  Pre-Diabetes No prior history of DM diagnosis, only borderline / pre-diabetes. Reports no concerns, last A1c 6.4 (01/2017) CBGs: checks at home Meds: Never on meds Currently not on ACEi / ARB - due for microalbumin urine test - unable to get specimen today Lifestyle: - Diet (Tries to improve diet, but often not adhering)  - Exercise (Limited) Denies hypoglycemia, polyuria, visual changes, numbness or tingling.  CHRONIC ANXIETY, Insomnia: Chronic problem anxiety, PTSD, insomnia. He has been managed with Lorazepam for many years previously by [facility] - [place] then prior PCP. Last rx in March 2018, he does not take regularly, uses it primarily for sleep. - On Fluoxetine 20mg  daily, does well on this for PTSD and mood - Failed other meds such as Trazodone in past, felt more groggy in AM difficulty functioning - He has never had withdrawal from BDZ, has been out for weeks at a time and no problem - Previously followed by Charles Ewing, but limited ability to travel to facility now, he does go occasionally for  hearing evaluation - Denies alcohol consumption, other substances - Denies worsening anxiety, depression, mood instability, hallucinations, abnormal perceptions  COPD On Flovent. No recent flares Infrequently using Albuterol  Health Maintenance: UTD Flu Vacine  Prior pneumonia vaccine 02/2013, he declines any further doses  Depression screen Charles Ewing [place] 2/9 02/19/2017 05/15/2015  Decreased Interest 0 0  Down, Depressed, Hopeless 0 0  PHQ - 2 Score 0 0    Social History   Tobacco Use  . Smoking status: Current Every Day Smoker    Packs/day: 0.50  . Smokeless tobacco: Current User  Substance Use Topics  . Alcohol use: No  . Drug use: No    Review of Systems Per HPI unless specifically indicated above     Objective:    BP 128/77   Pulse (!) 59   Temp 97.7 F (36.5 C) (Oral)   Resp 16   Ht 5\' 11"  (1.803 m)   Wt 231 lb 6.4 oz (105 kg)   SpO2 99%   BMI 32.27 kg/m   Wt Readings from Last 3 Encounters:  02/19/17 231 lb 6.4 oz (105 kg)  09/24/16 232 lb 14.4 oz (105.6 kg)  09/03/16 233 lb (105.7 kg)    Physical Exam  Constitutional: He is oriented to person, place, and time. He appears well-developed and well-nourished. No distress.  Well-appearing elderly 77 year old male, comfortable, cooperative  HENT:  Head: Normocephalic and atraumatic.  Mouth/Throat: Oropharynx is clear and moist.  Eyes: Conjunctivae are normal. Right eye exhibits no discharge. Left eye exhibits no discharge.  Neck: Normal range of motion. Neck supple.  Cardiovascular: Normal rate, regular rhythm, normal heart sounds and intact distal pulses.  No murmur heard. Pulmonary/Chest: Effort normal and breath sounds normal. No respiratory distress. He has no wheezes. He has no rales.  Good air movement  Musculoskeletal: Normal range of motion. He exhibits no edema.  Lymphadenopathy:    He has no cervical adenopathy.  Neurological: He is alert and oriented to person, place, and time.  No tremors  Skin: Skin  is warm and dry. No rash noted. He is not diaphoretic. No erythema.  Psychiatric: He has a normal mood and affect. His behavior is normal.  Well groomed, good eye contact, normal speech and thoughts  Nursing note and vitals reviewed.  Results for orders placed or performed in visit on 02/07/17  Hemoglobin A1c  Result Value Ref Range   Hemoglobin A1C 6.4       Assessment & Plan:   Problem List Items Addressed This Visit    Abdominal aortic aneurysm (AAA) ([place])    Stable AAA per [facility] Defer imaging repeat at this time Continue BB      Anxiety    Stable chronic problem >20 years, on BDZ, by history seems like prior history of possible PTSD contributing, with poor sleep - No long established with Psychiatry  Plan 1. Refilled chronic Lorazepam 0.5mg  1 time nightly PRN insomnia #30, +2 refills for 3 month supply - discussed risks of chronic BDZ therapy, concerns of dependence, withdrawal, abuse potential. He is aware not to consume alcohol, has not had problem with withdrawal before. - Advised against BID use - [place] Controlled substance signed - He was unable to leave urine sample today for microalbumin and UDS - if unable again to leave 2nd sample will no longer be able to rx Lorazepam 2. Follow-up q 3 months - future consider alternative agents and other therapy      Relevant Medications   LORazepam (ATIVAN) 0.5 MG tablet   COPD (chronic obstructive pulmonary disease) (HCC)    Stable without flare Controlled on Flovent Has Albuterol PRN not using regularly      Coronary artery disease    Chronic CAD Stable without worsening angina Continue ASA, BB, Statin Follow-up with [facility] as planned      Hypertension - Primary    Improved HTN control, concern with low reading orthostasis - Home BP readings reviewed per Charles Ewing nursing  Complication with orthostatic hypotension, CAD, AAA   Plan:  1. Continue current BP regimen - Metoprolol XL 50mg  daily - DC'd Lasix 2.  Encourage improved lifestyle - low sodium diet, regular exercise 3. Continue monitor BP outside office, bring readings to next visit, if persistently >140/90 or Charles symptoms notify office sooner      Insomnia    Likely secondary to chronic anxiety / possible PTSD, on chronic BDZ ativan with improvement, >20 years -See anxiety A&P - Refilled Lorazepam for 3 month supply      Relevant Medications   LORazepam (ATIVAN) 0.5 MG tablet   Orthostatic hypotension    Recent orthostatic hypotension likely 2/2 daily lasix 10mg  without edema Previously he was DC'd on lasix by me back in 11/2015, but has resumed it since Also on Flomax likely contributing  Plan: 1. Discontinue Lasix 10mg  daily (half of 20mg  tab) - he may keep existing bottle just in case has any significant swelling or acute flare, may take but if he takes any needs to contact office or cardiology  RESOLVED: Pedal edema    Resolved Not indication for lasix Again counseling, DC'd lasix      Prediabetes    Elevated A1c to 6.4, concern at risk of progression to DM2, questionable history if had A1c >6.5 in past Concern with obesity, HTN, HLD  Plan:  1. Not on any therapy currently  2. Encourage improved lifestyle - low carb, low sugar diet, reduce portion size, continue improving regular exercise         Meds ordered this encounter  Medications  . LORazepam (ATIVAN) 0.5 MG tablet    Sig: Take 1 tablet (0.5 mg total) by mouth at bedtime as needed for anxiety.    Dispense:  30 tablet    Refill:  2    Follow up plan: Return in about 3 months (around 05/20/2017) for HTN, Insomnia med refills.  [name], [facility] 02/19/2017, 10:40 PM

## 2017-02-19 NOTE — Patient Instructions (Addendum)
Thank you for coming to the office today.  1. STOP taking Furosemide (Lasix) half tab of 20mg  - no longer take daily. ONLY TAKE AS NEEDED for lower leg ankle swelling, if you need to take it more than 1 x a week then call office. This medicine will cause you to become dry and lose fluid, and is making you dizzy and low BP  2. Refilled Lorazepam - only take one pill 0.5mg  NIGHTLY at bedtime as needed for sleep - be cautious about side effects if starts to affect you  Notify us  3. COntinue other meds at this time  Follow-up with Cardiology and other specialists  Please schedule a Follow-up Appointment to: Return in about 3 months (around 05/20/2017) for HTN, Insomnia med refills.    If you have any other questions or concerns, please feel free to call the office or send a message through [place]. You may also schedule an earlier appointment if necessary.  Additionally, you may be receiving a survey about your experience at our office within a few days to 1 week by e-mail or mail. We value your feedback.  [name], DO [place]

## 2017-02-20 DIAGNOSIS — E1165 Type 2 diabetes mellitus with hyperglycemia: Secondary | ICD-10-CM | POA: Diagnosis not present

## 2017-02-20 DIAGNOSIS — K219 Gastro-esophageal reflux disease without esophagitis: Secondary | ICD-10-CM | POA: Diagnosis not present

## 2017-02-20 DIAGNOSIS — R609 Edema, unspecified: Secondary | ICD-10-CM | POA: Diagnosis not present

## 2017-02-20 DIAGNOSIS — Z7982 Long term (current) use of aspirin: Secondary | ICD-10-CM | POA: Diagnosis not present

## 2017-02-20 DIAGNOSIS — I499 Cardiac arrhythmia, unspecified: Secondary | ICD-10-CM | POA: Diagnosis not present

## 2017-02-20 DIAGNOSIS — J449 Chronic obstructive pulmonary disease, unspecified: Secondary | ICD-10-CM | POA: Diagnosis not present

## 2017-02-20 DIAGNOSIS — I10 Essential (primary) hypertension: Secondary | ICD-10-CM | POA: Diagnosis not present

## 2017-02-20 DIAGNOSIS — E785 Hyperlipidemia, unspecified: Secondary | ICD-10-CM | POA: Diagnosis not present

## 2017-02-20 DIAGNOSIS — N200 Calculus of kidney: Secondary | ICD-10-CM | POA: Diagnosis not present

## 2017-02-20 DIAGNOSIS — I2510 Atherosclerotic heart disease of native coronary artery without angina pectoris: Secondary | ICD-10-CM | POA: Diagnosis not present

## 2017-03-04 DIAGNOSIS — I10 Essential (primary) hypertension: Secondary | ICD-10-CM | POA: Diagnosis not present

## 2017-03-04 DIAGNOSIS — E1165 Type 2 diabetes mellitus with hyperglycemia: Secondary | ICD-10-CM | POA: Diagnosis not present

## 2017-03-04 DIAGNOSIS — R609 Edema, unspecified: Secondary | ICD-10-CM | POA: Diagnosis not present

## 2017-03-04 DIAGNOSIS — E785 Hyperlipidemia, unspecified: Secondary | ICD-10-CM | POA: Diagnosis not present

## 2017-03-04 DIAGNOSIS — K219 Gastro-esophageal reflux disease without esophagitis: Secondary | ICD-10-CM | POA: Diagnosis not present

## 2017-03-04 DIAGNOSIS — N200 Calculus of kidney: Secondary | ICD-10-CM | POA: Diagnosis not present

## 2017-03-04 DIAGNOSIS — Z7982 Long term (current) use of aspirin: Secondary | ICD-10-CM | POA: Diagnosis not present

## 2017-03-04 DIAGNOSIS — I2510 Atherosclerotic heart disease of native coronary artery without angina pectoris: Secondary | ICD-10-CM | POA: Diagnosis not present

## 2017-03-04 DIAGNOSIS — J449 Chronic obstructive pulmonary disease, unspecified: Secondary | ICD-10-CM | POA: Diagnosis not present

## 2017-03-04 DIAGNOSIS — I499 Cardiac arrhythmia, unspecified: Secondary | ICD-10-CM | POA: Diagnosis not present

## 2017-03-24 DIAGNOSIS — J449 Chronic obstructive pulmonary disease, unspecified: Secondary | ICD-10-CM | POA: Diagnosis not present

## 2017-03-24 DIAGNOSIS — Z7982 Long term (current) use of aspirin: Secondary | ICD-10-CM | POA: Diagnosis not present

## 2017-03-24 DIAGNOSIS — R609 Edema, unspecified: Secondary | ICD-10-CM | POA: Diagnosis not present

## 2017-03-24 DIAGNOSIS — I2510 Atherosclerotic heart disease of native coronary artery without angina pectoris: Secondary | ICD-10-CM | POA: Diagnosis not present

## 2017-03-24 DIAGNOSIS — I10 Essential (primary) hypertension: Secondary | ICD-10-CM | POA: Diagnosis not present

## 2017-03-24 DIAGNOSIS — E785 Hyperlipidemia, unspecified: Secondary | ICD-10-CM | POA: Diagnosis not present

## 2017-03-24 DIAGNOSIS — E1165 Type 2 diabetes mellitus with hyperglycemia: Secondary | ICD-10-CM | POA: Diagnosis not present

## 2017-03-24 DIAGNOSIS — N200 Calculus of kidney: Secondary | ICD-10-CM | POA: Diagnosis not present

## 2017-03-24 DIAGNOSIS — I499 Cardiac arrhythmia, unspecified: Secondary | ICD-10-CM | POA: Diagnosis not present

## 2017-03-24 DIAGNOSIS — K219 Gastro-esophageal reflux disease without esophagitis: Secondary | ICD-10-CM | POA: Diagnosis not present

## 2017-03-28 DIAGNOSIS — M79674 Pain in right toe(s): Secondary | ICD-10-CM | POA: Diagnosis not present

## 2017-03-28 DIAGNOSIS — M79675 Pain in left toe(s): Secondary | ICD-10-CM | POA: Diagnosis not present

## 2017-03-28 DIAGNOSIS — B351 Tinea unguium: Secondary | ICD-10-CM | POA: Diagnosis not present

## 2017-04-08 DIAGNOSIS — N200 Calculus of kidney: Secondary | ICD-10-CM | POA: Diagnosis not present

## 2017-04-08 DIAGNOSIS — I10 Essential (primary) hypertension: Secondary | ICD-10-CM | POA: Diagnosis not present

## 2017-04-08 DIAGNOSIS — Z7982 Long term (current) use of aspirin: Secondary | ICD-10-CM | POA: Diagnosis not present

## 2017-04-08 DIAGNOSIS — R609 Edema, unspecified: Secondary | ICD-10-CM | POA: Diagnosis not present

## 2017-04-08 DIAGNOSIS — I2510 Atherosclerotic heart disease of native coronary artery without angina pectoris: Secondary | ICD-10-CM | POA: Diagnosis not present

## 2017-04-08 DIAGNOSIS — E1165 Type 2 diabetes mellitus with hyperglycemia: Secondary | ICD-10-CM | POA: Diagnosis not present

## 2017-04-08 DIAGNOSIS — I499 Cardiac arrhythmia, unspecified: Secondary | ICD-10-CM | POA: Diagnosis not present

## 2017-04-08 DIAGNOSIS — E785 Hyperlipidemia, unspecified: Secondary | ICD-10-CM | POA: Diagnosis not present

## 2017-04-08 DIAGNOSIS — K219 Gastro-esophageal reflux disease without esophagitis: Secondary | ICD-10-CM | POA: Diagnosis not present

## 2017-04-08 DIAGNOSIS — J449 Chronic obstructive pulmonary disease, unspecified: Secondary | ICD-10-CM | POA: Diagnosis not present

## 2017-04-17 ENCOUNTER — Other Ambulatory Visit: Payer: Self-pay | Admitting: Family Medicine

## 2017-04-17 MED ORDER — FLUTICASONE PROPIONATE 50 MCG/ACT NA SUSP: 2.0000 | Freq: Every day | NASAL | 3 refills | Status: DC

## 2017-04-18 DIAGNOSIS — N200 Calculus of kidney: Secondary | ICD-10-CM | POA: Diagnosis not present

## 2017-04-18 DIAGNOSIS — K219 Gastro-esophageal reflux disease without esophagitis: Secondary | ICD-10-CM | POA: Diagnosis not present

## 2017-04-18 DIAGNOSIS — Z7982 Long term (current) use of aspirin: Secondary | ICD-10-CM | POA: Diagnosis not present

## 2017-04-18 DIAGNOSIS — R609 Edema, unspecified: Secondary | ICD-10-CM | POA: Diagnosis not present

## 2017-04-18 DIAGNOSIS — I499 Cardiac arrhythmia, unspecified: Secondary | ICD-10-CM | POA: Diagnosis not present

## 2017-04-18 DIAGNOSIS — I2510 Atherosclerotic heart disease of native coronary artery without angina pectoris: Secondary | ICD-10-CM | POA: Diagnosis not present

## 2017-04-18 DIAGNOSIS — E1165 Type 2 diabetes mellitus with hyperglycemia: Secondary | ICD-10-CM | POA: Diagnosis not present

## 2017-04-18 DIAGNOSIS — I10 Essential (primary) hypertension: Secondary | ICD-10-CM | POA: Diagnosis not present

## 2017-04-18 DIAGNOSIS — J449 Chronic obstructive pulmonary disease, unspecified: Secondary | ICD-10-CM | POA: Diagnosis not present

## 2017-04-18 DIAGNOSIS — E785 Hyperlipidemia, unspecified: Secondary | ICD-10-CM | POA: Diagnosis not present

## 2017-04-29 DIAGNOSIS — R001 Bradycardia, unspecified: Secondary | ICD-10-CM | POA: Diagnosis not present

## 2017-04-29 DIAGNOSIS — I2510 Atherosclerotic heart disease of native coronary artery without angina pectoris: Secondary | ICD-10-CM | POA: Diagnosis not present

## 2017-04-29 DIAGNOSIS — I10 Essential (primary) hypertension: Secondary | ICD-10-CM | POA: Diagnosis not present

## 2017-04-29 DIAGNOSIS — E785 Hyperlipidemia, unspecified: Secondary | ICD-10-CM | POA: Diagnosis not present

## 2017-04-29 DIAGNOSIS — I499 Cardiac arrhythmia, unspecified: Secondary | ICD-10-CM | POA: Diagnosis not present

## 2017-04-29 DIAGNOSIS — K219 Gastro-esophageal reflux disease without esophagitis: Secondary | ICD-10-CM | POA: Diagnosis not present

## 2017-04-29 DIAGNOSIS — J449 Chronic obstructive pulmonary disease, unspecified: Secondary | ICD-10-CM | POA: Diagnosis not present

## 2017-04-29 DIAGNOSIS — R609 Edema, unspecified: Secondary | ICD-10-CM | POA: Diagnosis not present

## 2017-04-29 DIAGNOSIS — N200 Calculus of kidney: Secondary | ICD-10-CM | POA: Diagnosis not present

## 2017-04-29 DIAGNOSIS — E1165 Type 2 diabetes mellitus with hyperglycemia: Secondary | ICD-10-CM | POA: Diagnosis not present

## 2017-04-29 DIAGNOSIS — Z7982 Long term (current) use of aspirin: Secondary | ICD-10-CM | POA: Diagnosis not present

## 2017-05-12 DIAGNOSIS — I499 Cardiac arrhythmia, unspecified: Secondary | ICD-10-CM | POA: Diagnosis not present

## 2017-05-12 DIAGNOSIS — K219 Gastro-esophageal reflux disease without esophagitis: Secondary | ICD-10-CM | POA: Diagnosis not present

## 2017-05-12 DIAGNOSIS — R001 Bradycardia, unspecified: Secondary | ICD-10-CM | POA: Diagnosis not present

## 2017-05-12 DIAGNOSIS — E785 Hyperlipidemia, unspecified: Secondary | ICD-10-CM | POA: Diagnosis not present

## 2017-05-12 DIAGNOSIS — R609 Edema, unspecified: Secondary | ICD-10-CM | POA: Diagnosis not present

## 2017-05-12 DIAGNOSIS — I10 Essential (primary) hypertension: Secondary | ICD-10-CM | POA: Diagnosis not present

## 2017-05-12 DIAGNOSIS — Z7982 Long term (current) use of aspirin: Secondary | ICD-10-CM | POA: Diagnosis not present

## 2017-05-12 DIAGNOSIS — J449 Chronic obstructive pulmonary disease, unspecified: Secondary | ICD-10-CM | POA: Diagnosis not present

## 2017-05-12 DIAGNOSIS — N200 Calculus of kidney: Secondary | ICD-10-CM | POA: Diagnosis not present

## 2017-05-12 DIAGNOSIS — E1165 Type 2 diabetes mellitus with hyperglycemia: Secondary | ICD-10-CM | POA: Diagnosis not present

## 2017-05-12 DIAGNOSIS — I2510 Atherosclerotic heart disease of native coronary artery without angina pectoris: Secondary | ICD-10-CM | POA: Diagnosis not present

## 2017-05-26 ENCOUNTER — Ambulatory Visit (INDEPENDENT_AMBULATORY_CARE_PROVIDER_SITE_OTHER): Payer: Medicare Other | Admitting: Family Medicine

## 2017-05-26 ENCOUNTER — Encounter: Payer: Self-pay | Admitting: Family Medicine

## 2017-05-26 VITALS — BP 116/63 | HR 51 | Temp 97.7°F | Resp 16 | Ht 71.0 in | Wt 227.6 lb

## 2017-05-26 DIAGNOSIS — R7303 Prediabetes: Secondary | ICD-10-CM | POA: Diagnosis not present

## 2017-05-26 LAB — POCT UA - MICROALBUMIN: Microalbumin Ur, POC: 100 mg/L

## 2017-05-26 LAB — POCT GLYCOSYLATED HEMOGLOBIN (HGB A1C): HEMOGLOBIN A1C: 6.1 — AB (ref ?–5.7)

## 2017-05-26 MED ORDER — METFORMIN HCL ER 500 MG PO TB24: 500.0000 mg | ORAL_TABLET | Freq: Every day | ORAL | 3 refills | Status: DC

## 2017-05-26 NOTE — Assessment & Plan Note (Addendum)
Improved A1c down to 6.1, from 6.4, concern with poor lifestyle high risk progression to DM Concern with obesity, HTN, HLD Fam history DM  Plan:  1. Not on any therapy previously - offered preventative therapy with Metformin - Start Metformin XR 500mg  daily - #90 - adjust in future if need 2. Encourage improved lifestyle - low carb, low sugar diet, reduce portion size, continue improving regular exercise if able - Urine POC microalbumin is mildly positive 100 today 3. Follow-up 3 months PreDM A1c - monitor progress - discuss new start ACEi/ARB with some protein in urine

## 2017-05-26 NOTE — Progress Notes (Signed)
Subjective:    Patient ID: Charles Ewing, male    DOB: 18-Jul-1940, 77 y.o.   MRN: [id number]  Charles Ewing is a 77 y.o. male presenting on 05/26/2017 for Pre-Diabetes   HPI    [place] in past with elevated A1c, recent trend up to A1c 6.4, now today improved to 6.1 CBGs: checks at home, infrequently, but he has readings ranging mid 100s, avg 140-150, high of 200, no hypoglycemia or low sugars Meds: Never on meds Currently not on ACEi / ARB - due for microalbumin urine test today Lifestyle: - Diet (Tries to improve diet at times, but still admits many things in diet are not helping, but he does not want to change, drinks soda regularly, less water, he eats less cake and cookies, but does eat candy instead, he eats potatoes often) - Exercise (Limited - due to knee pain) - Fam history of DM Denies hypoglycemia, polyuria, visual changes, numbness or tingling.  Health Maintenance:  Declines pneumonia vaccine, repeat. He has had initial vaccine in 2015.  Depression screen [facility] 2/9 05/26/2017 02/19/2017 05/15/2015  Decreased Interest 0 0 0  Down, Depressed, Hopeless 0 0 0  PHQ - 2 Score 0 0 0    Social History   Tobacco Use  . Smoking status: Current Every Day Smoker    Packs/day: 0.50  . Smokeless tobacco: Current User  Substance Use Topics  . Alcohol use: No  . Drug use: No    Review of Systems Per HPI unless specifically indicated above     Objective:    BP 116/63   Pulse (!) 51   Temp 97.7 F (36.5 C) (Oral)   Resp 16   Ht 5\' 11"  (1.803 m)   Wt 227 lb 9.6 oz (103.2 kg)   SpO2 97%   BMI 31.74 kg/m   Wt Readings from Last 3 Encounters:  05/26/17 227 lb 9.6 oz (103.2 kg)  02/19/17 231 lb 6.4 oz (105 kg)  09/24/16 232 lb 14.4 oz (105.6 kg)    Physical Exam  Constitutional: He is oriented to person, place, and time. He appears well-developed and well-nourished. No distress.  Well-appearing, comfortable, cooperative  HENT:  Head:  Normocephalic and atraumatic.  Mouth/Throat: Oropharynx is clear and moist.  Eyes: Conjunctivae are normal. Right eye exhibits no discharge. Left eye exhibits no discharge.  Cardiovascular: Normal rate, regular rhythm, normal heart sounds and intact distal pulses.  No murmur heard. Pulmonary/Chest: Effort normal and breath sounds normal. No respiratory distress. He has no wheezes. He has no rales.  Musculoskeletal: He exhibits no edema.  Neurological: He is alert and oriented to person, place, and time.  Skin: Skin is warm and dry. No rash noted. He is not diaphoretic. No erythema.  Psychiatric: He has a normal mood and affect. His behavior is normal.  Well groomed, good eye contact, normal speech and thoughts  Nursing note and vitals reviewed.  Results for orders placed or performed in visit on 05/26/17  POCT HgB A1C  Result Value Ref Range   Hemoglobin A1C 6.1 (A) 5.7  POCT UA - Microalbumin  Result Value Ref Range   Microalbumin Ur, POC 100 mg/L   Creatinine, POC  mg/dL   Albumin/Creatinine Ratio, Urine, POC        Assessment & Plan:   Problem List Items Addressed This Visit    Prediabetes - Primary    Improved A1c down to 6.1, from 6.4, concern with poor lifestyle high risk progression  to DM Concern with obesity, HTN, HLD Fam history DM  Plan:  1. Not on any therapy previously - offered preventative therapy with Metformin - Start Metformin XR 500mg  daily - #90 - adjust in future if need 2. Encourage improved lifestyle - low carb, low sugar diet, reduce portion size, continue improving regular exercise if able - Urine POC microalbumin is mildly positive 100 today 3. Follow-up 3 months PreDM A1c - monitor progress - discuss new start ACEi/ARB with some protein in urine      Relevant Medications   metFORMIN (GLUCOPHAGE-XR) 500 MG 24 hr tablet   Other Relevant Orders   POCT HgB A1C (Completed)   POCT UA - Microalbumin (Completed)      Meds ordered this encounter    Medications  . metFORMIN (GLUCOPHAGE-XR) 500 MG 24 hr tablet    Sig: Take 1 tablet (500 mg total) by mouth daily with breakfast.    Dispense:  90 tablet    Refill:  3    Follow up plan: Return in about 3 months (around 08/25/2017) for Pre-Diabetes A1c.  [name], [facility] 05/26/2017, 1:12 PM

## 2017-05-26 NOTE — Patient Instructions (Addendum)
Thank you for coming to the office today.  A1c 6.1, today improved from 6.4. Overall, keep trying to reduce sugars, limit soda and sweets. Reduce potato carb portions.  Start Metformin 500mg  (Extended Release) - 24 hour pill - one a day with food. - Take with food, can upset stomach and cause some diarrhea at times  Can switch med if need to to 12 hour version, cheaper if needed  Please schedule a Follow-up Appointment to: Return in about 3 months (around 08/25/2017) for Pre-Diabetes A1c.  If you have any other questions or concerns, please feel free to call the office or send a message through [place]. You may also schedule an earlier appointment if necessary.  Additionally, you may be receiving a survey about your experience at our office within a few days to 1 week by e-mail or mail. We value your feedback.  [name], DO [place]

## 2017-06-12 DIAGNOSIS — H6123 Impacted cerumen, bilateral: Secondary | ICD-10-CM | POA: Diagnosis not present

## 2017-06-12 DIAGNOSIS — H60339 Swimmer's ear, unspecified ear: Secondary | ICD-10-CM | POA: Diagnosis not present

## 2017-06-16 DIAGNOSIS — J449 Chronic obstructive pulmonary disease, unspecified: Secondary | ICD-10-CM | POA: Diagnosis not present

## 2017-06-16 DIAGNOSIS — I714 Abdominal aortic aneurysm, without rupture: Secondary | ICD-10-CM | POA: Diagnosis not present

## 2017-06-16 DIAGNOSIS — I10 Essential (primary) hypertension: Secondary | ICD-10-CM | POA: Diagnosis not present

## 2017-06-16 DIAGNOSIS — R0602 Shortness of breath: Secondary | ICD-10-CM | POA: Diagnosis not present

## 2017-06-16 DIAGNOSIS — I951 Orthostatic hypotension: Secondary | ICD-10-CM | POA: Diagnosis not present

## 2017-06-18 DIAGNOSIS — J449 Chronic obstructive pulmonary disease, unspecified: Secondary | ICD-10-CM | POA: Diagnosis not present

## 2017-06-18 DIAGNOSIS — E1165 Type 2 diabetes mellitus with hyperglycemia: Secondary | ICD-10-CM | POA: Diagnosis not present

## 2017-06-18 DIAGNOSIS — K219 Gastro-esophageal reflux disease without esophagitis: Secondary | ICD-10-CM | POA: Diagnosis not present

## 2017-06-18 DIAGNOSIS — R001 Bradycardia, unspecified: Secondary | ICD-10-CM | POA: Diagnosis not present

## 2017-06-18 DIAGNOSIS — I499 Cardiac arrhythmia, unspecified: Secondary | ICD-10-CM | POA: Diagnosis not present

## 2017-06-18 DIAGNOSIS — N200 Calculus of kidney: Secondary | ICD-10-CM | POA: Diagnosis not present

## 2017-06-18 DIAGNOSIS — I10 Essential (primary) hypertension: Secondary | ICD-10-CM | POA: Diagnosis not present

## 2017-06-18 DIAGNOSIS — I2510 Atherosclerotic heart disease of native coronary artery without angina pectoris: Secondary | ICD-10-CM | POA: Diagnosis not present

## 2017-06-18 DIAGNOSIS — E785 Hyperlipidemia, unspecified: Secondary | ICD-10-CM | POA: Diagnosis not present

## 2017-06-18 DIAGNOSIS — R609 Edema, unspecified: Secondary | ICD-10-CM | POA: Diagnosis not present

## 2017-06-18 DIAGNOSIS — Z7982 Long term (current) use of aspirin: Secondary | ICD-10-CM | POA: Diagnosis not present

## 2017-06-26 DIAGNOSIS — M79674 Pain in right toe(s): Secondary | ICD-10-CM | POA: Diagnosis not present

## 2017-06-26 DIAGNOSIS — B351 Tinea unguium: Secondary | ICD-10-CM | POA: Diagnosis not present

## 2017-06-26 DIAGNOSIS — M79675 Pain in left toe(s): Secondary | ICD-10-CM | POA: Diagnosis not present

## 2017-07-07 DIAGNOSIS — E1165 Type 2 diabetes mellitus with hyperglycemia: Secondary | ICD-10-CM | POA: Diagnosis not present

## 2017-07-21 DIAGNOSIS — E1165 Type 2 diabetes mellitus with hyperglycemia: Secondary | ICD-10-CM | POA: Diagnosis not present

## 2017-08-15 ENCOUNTER — Other Ambulatory Visit: Payer: Self-pay | Admitting: Family Medicine

## 2017-08-15 DIAGNOSIS — F5105 Insomnia due to other mental disorder: Secondary | ICD-10-CM

## 2017-08-15 DIAGNOSIS — F99 Mental disorder, not otherwise specified: Secondary | ICD-10-CM

## 2017-08-15 DIAGNOSIS — F419 Anxiety disorder, unspecified: Secondary | ICD-10-CM

## 2017-08-19 ENCOUNTER — Telehealth: Payer: Self-pay | Admitting: Family Medicine

## 2017-08-19 DIAGNOSIS — E1165 Type 2 diabetes mellitus with hyperglycemia: Secondary | ICD-10-CM | POA: Diagnosis not present

## 2017-08-19 NOTE — Telephone Encounter (Signed)
Judson Roch, nurse with [facility] needs a verbal for recertification for a once a month visit for monitoring.  Her call back number is [phone]

## 2017-08-19 NOTE — Telephone Encounter (Signed)
Please notify Sarah with [facility] that I would give my verbal for recertification for once a month visit.  [name], DO [facility] 08/19/2017, 5:22 PM

## 2017-08-20 NOTE — Telephone Encounter (Signed)
Verbal given 

## 2017-08-25 ENCOUNTER — Other Ambulatory Visit: Payer: Self-pay | Admitting: Family Medicine

## 2017-08-25 ENCOUNTER — Ambulatory Visit: Payer: Medicare Other | Admitting: Family Medicine

## 2017-08-25 DIAGNOSIS — J432 Centrilobular emphysema: Secondary | ICD-10-CM

## 2017-08-25 DIAGNOSIS — R109 Unspecified abdominal pain: Secondary | ICD-10-CM

## 2017-08-25 DIAGNOSIS — K5901 Slow transit constipation: Secondary | ICD-10-CM

## 2017-09-17 ENCOUNTER — Other Ambulatory Visit: Payer: Self-pay | Admitting: Urology

## 2017-09-17 DIAGNOSIS — N401 Enlarged prostate with lower urinary tract symptoms: Secondary | ICD-10-CM

## 2017-09-22 DIAGNOSIS — E1165 Type 2 diabetes mellitus with hyperglycemia: Secondary | ICD-10-CM | POA: Diagnosis not present

## 2017-09-25 ENCOUNTER — Ambulatory Visit (INDEPENDENT_AMBULATORY_CARE_PROVIDER_SITE_OTHER): Payer: Medicare Other | Admitting: Urology

## 2017-09-25 ENCOUNTER — Encounter: Payer: Self-pay | Admitting: Urology

## 2017-09-25 VITALS — BP 107/66 | HR 70 | Ht 71.0 in | Wt 224.6 lb

## 2017-09-25 DIAGNOSIS — N401 Enlarged prostate with lower urinary tract symptoms: Secondary | ICD-10-CM

## 2017-09-25 DIAGNOSIS — R310 Gross hematuria: Secondary | ICD-10-CM | POA: Diagnosis not present

## 2017-09-25 LAB — URINALYSIS, COMPLETE
Bilirubin, UA: NEGATIVE
Glucose, UA: NEGATIVE
KETONES UA: NEGATIVE
NITRITE UA: NEGATIVE
Protein, UA: NEGATIVE
RBC UA: NEGATIVE
SPEC GRAV UA: 1.02 (ref 1.005–1.030)
UUROB: 2 mg/dL — AB (ref 0.2–1.0)
pH, UA: 6.5 (ref 5.0–7.5)

## 2017-09-25 LAB — MICROSCOPIC EXAMINATION
EPITHELIAL CELLS (NON RENAL): NONE SEEN /HPF (ref 0–10)
RBC MICROSCOPIC, UA: NONE SEEN /HPF (ref 0–2)

## 2017-09-25 NOTE — Progress Notes (Signed)
   09/25/2017 2:17 PM   Charles Ewing 11-Jan-1941 [id number]  Reason for visit: Follow up LUTS/hematuria  HPI: I had the pleasure of seeing Charles Ewing in urology clinic today for follow-up of lower urinary tract symptoms.  Briefly is a frail appearing 77 year old male with history of TURP with Dr. Bernardo Heater in 2010, as well as recent hematuria work-up with Dr. Junious Silk in 2018.  He also has a history of elevated PSA levels around 6-8, however those had been decreasing over the last few years and he elected to discontinue PSA screening at the last urology visit.  Additionally, he had a history of 3 prior negative prostate biopsies.  His urinary symptoms are well controlled on Flomax.  He denies any recurrent episodes of gross hematuria.  Overall he feels he is doing very well from a urology perspective.   ROS: Please see flowsheet from today's date for complete review of systems.  Physical Exam: BP 107/66 (BP Location: Left Arm, Patient Position: Sitting, Cuff Size: Normal)   Pulse 70   Ht 5\' 11"  (1.803 m)   Wt 224 lb 9.6 oz (101.9 kg)   BMI 31.33 kg/m   Constitutional:  Alert and oriented, frail-appearing Respiratory: Normal respiratory effort, no increased work of breathing. GI: Abdomen is soft, nontender, nondistended, no abdominal masses Skin: No rashes, bruises or suspicious lesions. Neurologic: Grossly intact, no focal deficits, moving all 4 extremities. Psychiatric: Normal mood and affect  Urinalysis today with no signs of infection, 0 RBCs   Pertinent Imaging: None to review  Assessment & Plan:   In summary, Charles Ewing is a frail 77 year old male we had previously followed for elevated PSAs, lower urinary tract symptoms status post TURP in 2010, and negative hematuria work-up in 2018.     1. Gross hematuria Urinalysis negative today, no further screening needed unless recurrent hematuria  2. PCa screening -No longer screening with PSA secondary to age/frailty  3.  BPH/LUTS -Continue flomax, symptoms well controlled  Follow up as needed  [place], [place] [address], [place], [place] [address] [phone]

## 2017-10-03 DIAGNOSIS — M79675 Pain in left toe(s): Secondary | ICD-10-CM | POA: Diagnosis not present

## 2017-10-03 DIAGNOSIS — M79674 Pain in right toe(s): Secondary | ICD-10-CM | POA: Diagnosis not present

## 2017-10-03 DIAGNOSIS — B351 Tinea unguium: Secondary | ICD-10-CM | POA: Diagnosis not present

## 2017-10-07 DIAGNOSIS — D224 Melanocytic nevi of scalp and neck: Secondary | ICD-10-CM | POA: Diagnosis not present

## 2017-10-07 DIAGNOSIS — Z85828 Personal history of other malignant neoplasm of skin: Secondary | ICD-10-CM | POA: Diagnosis not present

## 2017-10-14 ENCOUNTER — Other Ambulatory Visit: Payer: Self-pay | Admitting: Family Medicine

## 2017-10-14 DIAGNOSIS — B370 Candidal stomatitis: Secondary | ICD-10-CM

## 2017-10-14 DIAGNOSIS — Z7951 Long term (current) use of inhaled steroids: Secondary | ICD-10-CM

## 2017-10-20 ENCOUNTER — Other Ambulatory Visit: Payer: Self-pay | Admitting: Urology

## 2017-10-20 DIAGNOSIS — N401 Enlarged prostate with lower urinary tract symptoms: Secondary | ICD-10-CM

## 2017-11-04 ENCOUNTER — Encounter: Payer: Self-pay | Admitting: Family Medicine

## 2017-11-04 ENCOUNTER — Ambulatory Visit (INDEPENDENT_AMBULATORY_CARE_PROVIDER_SITE_OTHER): Payer: Medicare Other | Admitting: Family Medicine

## 2017-11-04 VITALS — BP 123/70 | HR 58 | Temp 97.5°F | Resp 16 | Ht 71.0 in | Wt 220.0 lb

## 2017-11-04 DIAGNOSIS — B370 Candidal stomatitis: Secondary | ICD-10-CM

## 2017-11-04 DIAGNOSIS — J441 Chronic obstructive pulmonary disease with (acute) exacerbation: Secondary | ICD-10-CM

## 2017-11-04 DIAGNOSIS — J432 Centrilobular emphysema: Secondary | ICD-10-CM | POA: Diagnosis not present

## 2017-11-04 DIAGNOSIS — Z7951 Long term (current) use of inhaled steroids: Secondary | ICD-10-CM

## 2017-11-04 MED ORDER — ALBUTEROL SULFATE HFA 108 (90 BASE) MCG/ACT IN AERS: 2.0000 | INHALATION_SPRAY | Freq: Four times a day (QID) | RESPIRATORY_TRACT | 11 refills | Status: DC | PRN

## 2017-11-04 MED ORDER — NYSTATIN 100000 UNIT/ML MT SUSP: 5.0000 mL | Freq: Four times a day (QID) | OROMUCOSAL | 3 refills | Status: DC

## 2017-11-04 MED ORDER — AZITHROMYCIN 250 MG PO TABS: ORAL_TABLET | ORAL | 0 refills | Status: DC

## 2017-11-04 MED ORDER — PREDNISONE 50 MG PO TABS
50.0000 mg | ORAL_TABLET | Freq: Every day | ORAL | 0 refills | Status: DC
Start: 2017-11-04 — End: 2018-04-09

## 2017-11-04 MED ORDER — TIOTROPIUM BROMIDE MONOHYDRATE 18 MCG IN CAPS: 18.0000 ug | ORAL_CAPSULE | Freq: Every day | RESPIRATORY_TRACT | 11 refills | Status: DC

## 2017-11-04 NOTE — Patient Instructions (Addendum)
Thank you for coming to the office today.  Please schedule and return for a NURSE ONLY VISIT for VACCINE - Approximately around mid October 2019 - Need High Dose Flu Vaccine  Refilled Nystatin with additional refills  Also sent rx for Spiriva to continue  Sent new rx Albuterol rescue inhaler for refill - use few days as needed for next few days.  1. It sounds like you had an Upper Respiratory Virus or Allergies that cause sinus drainage that has settled into a Bronchitis, lower respiratory tract infection. I don't have concerns for pneumonia today, and think that this should gradually improve. Once you are feeling better, the cough may take a few weeks to fully resolve.  You do not need antibiotics at this time. However, if it does not improve or your symptoms worsen as we have discussed, please follow-up to determine if we need to change your treatment.  - Start Prednisone 50mg  daily for next 5 days - this will open up lungs allow you to breath better and treat that wheezing or bronchospasm - Use Albuterol inhaler 2 puffs every 4-6 hours around the clock for next 2-3 days, max up to 5 days then use as needed   - IF not improving after 24-48 hours, or develop worsening fever, productive cough then start Azithromycin Z pak (antibiotic) 2 tabs day 1, then 1 tab x 4 days, complete entire course even if improved  - Drink plenty of fluids to improve congestion - You may try over the counter Nasal Saline spray (Simply Saline, Ocean Spray) as needed to reduce congestion. - May take Tylenol (up to 500-1000mg  per dose) 3 times a day as needed for aches and fevers. If it is safe for you to take ibuprofen or advil, you may take these medicines as needed as well.  - Use nasal saline (Simply Saline or Ocean Spray) to flush nasal congestion multiple times a day, may help cough - Drink plenty of fluids to improve congestion  If your symptoms seem to worsen instead of improve over next several days,  including significant fever / chills, worsening shortness of breath, worsening wheezing, or nausea / vomiting and can't take medicines - return sooner or go to hospital Emergency Department for more immediate treatment.   Please schedule a Follow-up Appointment to: Return in about 1 week (around 11/11/2017), or if symptoms worsen or fail to improve, for COPD.  If you have any other questions or concerns, please feel free to call the office or send a message through [place]. You may also schedule an earlier appointment if necessary.  Additionally, you may be receiving a survey about your experience at our office within a few days to 1 week by e-mail or mail. We value your feedback.  [name], DO [place]

## 2017-11-04 NOTE — Progress Notes (Signed)
Subjective:    Patient ID: Charles Ewing, male    DOB: 1940/09/17, 77 y.o.   MRN: [id number]  Charles Ewing is a 77 y.o. male presenting on 11/04/2017 for COPD   HPI   Acute Bronchitis vs COPD Flare Reports symptoms started with mild flare up for past several days, seemed to improve some, but overall he was concerned that this flare up was one of his worst in recent memory. He has responded to prednisone well in the past. He is still using maintenance inhalers with Spiriva daily and Flovent twice daily. Infrequently using Albuterol, he needs new rx. - He normally will get problem with oral thrush due to inhalers and steroids in past, request refill today, uses regularly and tolerates well - Admits productive cough at times with some dyspnea - Denies any chest pain, fever chills, sweats, hemoptysis, nausea vomiting  Health Maintenance: Due for Flu vaccine, given COPD today will defer, and return in 2-3 weeks also when high dose in stock.  Depression screen [facility] 2/9 05/26/2017 02/19/2017 05/15/2015  Decreased Interest 0 0 0  Down, Depressed, Hopeless 0 0 0  PHQ - 2 Score 0 0 0    Social History   Tobacco Use  . Smoking status: Current Every Day Smoker    Packs/day: 0.50  . Smokeless tobacco: Current User  Substance Use Topics  . Alcohol use: No  . Drug use: No    Review of Systems Per HPI unless specifically indicated above     Objective:    BP 123/70   Pulse (!) 58   Temp (!) 97.5 F (36.4 C) (Oral)   Resp 16   Ht 5\' 11"  (1.803 m)   Wt 220 lb (99.8 kg)   SpO2 98%   BMI 30.68 kg/m   Wt Readings from Last 3 Encounters:  11/04/17 220 lb (99.8 kg)  09/25/17 224 lb 9.6 oz (101.9 kg)  05/26/17 227 lb 9.6 oz (103.2 kg)    Physical Exam  Constitutional: He is oriented to person, place, and time. He appears well-developed and well-nourished. No distress.  Well-appearing elderly male, comfortable, cooperative  HENT:  Head: Normocephalic and atraumatic.  Mouth/Throat:  Oropharynx is clear and moist.  Eyes: Conjunctivae are normal. Right eye exhibits no discharge. Left eye exhibits no discharge.  Neck: Normal range of motion. Neck supple. No thyromegaly present.  Cardiovascular: Regular rhythm, normal heart sounds and intact distal pulses.  No murmur heard. Mild bradycardia  Pulmonary/Chest: Effort normal. No respiratory distress. He has wheezes (scattered upper airway wheezing bilateral). He has no rales.  Mild reduced air movement bilateral, lower lung fields. No focal crackles  Musculoskeletal: Normal range of motion. He exhibits no edema.  Lymphadenopathy:    He has no cervical adenopathy.  Neurological: He is alert and oriented to person, place, and time.  Skin: Skin is warm and dry. No rash noted. He is not diaphoretic. No erythema.  Psychiatric: He has a normal mood and affect. His behavior is normal.  Well groomed, good eye contact, normal speech and thoughts  Nursing note and vitals reviewed.      Assessment & Plan:   Problem List Items Addressed This Visit    COPD (chronic obstructive pulmonary disease) ([place]) - Primary   Relevant Medications   tiotropium (SPIRIVA HANDIHALER) 18 MCG inhalation capsule   albuterol (PROVENTIL HFA;VENTOLIN HFA) 108 (90 Base) MCG/ACT inhaler   predniSONE (DELTASONE) 50 MG tablet   azithromycin (ZITHROMAX Z-PAK) 250 MG tablet  Other Visit Diagnoses    Oral thrush       Relevant Medications   nystatin (MYCOSTATIN) 100000 UNIT/ML suspension   azithromycin (ZITHROMAX Z-PAK) 250 MG tablet   Long term current use of inhaled steroid       Relevant Medications   nystatin (MYCOSTATIN) 100000 UNIT/ML suspension   COPD with acute exacerbation (HCC)       Relevant Medications   tiotropium (SPIRIVA HANDIHALER) 18 MCG inhalation capsule   albuterol (PROVENTIL HFA;VENTOLIN HFA) 108 (90 Base) MCG/ACT inhaler   predniSONE (DELTASONE) 50 MG tablet   azithromycin (ZITHROMAX Z-PAK) 250 MG tablet      Consistent with  mild early acute exacerbation of COPD with worsening productive cough. Similar to prior exacerbations. - No hypoxia (98% on RA), afebrile, no recent hospitalization - Continues Spiriva and Flovent - refilled  Plan: 1. Start Prednisone 50mg  x 5 day steroid burst 2. Use albuterol q 4 hr regularly x 2-3 days. Continue maintenance inhalers - Add printed back up plan antibiotic Azithromycin Z-pak 5 day course only fill at pharmacy if not improved within 48 hours on prednisone - Refill Nystatin for oral thrush due to inhaled steroids  RTC about 1 week if not improving, otherwise strict return criteria to go to ED   Meds ordered this encounter  Medications  . nystatin (MYCOSTATIN) 100000 UNIT/ML suspension    Sig: Take 5 mLs (500,000 Units total) by mouth 4 (four) times daily.    Dispense:  473 mL    Refill:  3  . tiotropium (SPIRIVA HANDIHALER) 18 MCG inhalation capsule    Sig: Place 1 capsule (18 mcg total) into inhaler and inhale daily.    Dispense:  30 capsule    Refill:  11  . albuterol (PROVENTIL HFA;VENTOLIN HFA) 108 (90 Base) MCG/ACT inhaler    Sig: Inhale 2 puffs into the lungs every 6 (six) hours as needed for wheezing or shortness of breath.    Dispense:  1 Inhaler    Refill:  11  . predniSONE (DELTASONE) 50 MG tablet    Sig: Take 1 tablet (50 mg total) by mouth daily with breakfast.    Dispense:  5 tablet    Refill:  0  . azithromycin (ZITHROMAX Z-PAK) 250 MG tablet    Sig: Take 2 tabs (500mg  total) on Day 1. Take 1 tab (250mg ) daily for next 4 days.    Dispense:  6 tablet    Refill:  0    Follow up plan: Return in about 1 week (around 11/11/2017), or if symptoms worsen or fail to improve, for COPD.  F/u 4 months PreDM A1c  [name], DO [facility] 11/04/2017, 1:07 PM

## 2017-11-14 ENCOUNTER — Telehealth: Payer: Self-pay | Admitting: Family Medicine

## 2017-11-14 DIAGNOSIS — J441 Chronic obstructive pulmonary disease with (acute) exacerbation: Secondary | ICD-10-CM

## 2017-11-14 MED ORDER — LEVOFLOXACIN 500 MG PO TABS: 500.0000 mg | ORAL_TABLET | Freq: Every day | ORAL | 0 refills | Status: DC

## 2017-11-14 NOTE — Telephone Encounter (Signed)
Pt was in last week for COPD.  His congestion is worse and was instructed to come back in a back if he wasn't better.  Dr. Raliegh Ip doesn't have anything for Monday as of now.  Please call [phone]

## 2017-11-14 NOTE — Telephone Encounter (Signed)
Called patient back. Last seen 11/04/17 see note. He has been treated with Prednisone 50mg  daily for 5 days and Azithromycin Z pak and Albuterol among other COPD maintenance inhalers. Also cough medicine supportive care. He noticed some temporary improvement on antibiotic but then worsening again. Still productive cough, consistent with COPD. I advised him limited options other than trial different antibiotic for short term - start new Levaquin 500mg  daily x 7 days, sent to his pharmacy. He will pick it up. Advised since no availability now and going into weekend if not improving or worsening symptoms as discussed with COPD he needs to be seen more immediately at Urgent Care or Hospital ED for more prompt treatment. Last options would be return next week consider CXR or repeat Prednisone course.  [name], [facility] 11/14/2017, 6:24 PM

## 2017-11-18 ENCOUNTER — Other Ambulatory Visit: Payer: Self-pay | Admitting: Family Medicine

## 2017-11-18 DIAGNOSIS — F419 Anxiety disorder, unspecified: Secondary | ICD-10-CM

## 2017-11-18 DIAGNOSIS — F5105 Insomnia due to other mental disorder: Secondary | ICD-10-CM

## 2017-11-18 DIAGNOSIS — F99 Mental disorder, not otherwise specified: Secondary | ICD-10-CM

## 2017-11-21 ENCOUNTER — Other Ambulatory Visit: Payer: Self-pay | Admitting: Urology

## 2017-11-21 DIAGNOSIS — N401 Enlarged prostate with lower urinary tract symptoms: Secondary | ICD-10-CM

## 2017-11-25 ENCOUNTER — Telehealth: Payer: Self-pay | Admitting: Family Medicine

## 2017-11-25 NOTE — Telephone Encounter (Signed)
error 

## 2017-12-17 DIAGNOSIS — J449 Chronic obstructive pulmonary disease, unspecified: Secondary | ICD-10-CM | POA: Diagnosis not present

## 2017-12-17 DIAGNOSIS — R0602 Shortness of breath: Secondary | ICD-10-CM | POA: Diagnosis not present

## 2017-12-17 DIAGNOSIS — I10 Essential (primary) hypertension: Secondary | ICD-10-CM | POA: Diagnosis not present

## 2017-12-17 DIAGNOSIS — I2510 Atherosclerotic heart disease of native coronary artery without angina pectoris: Secondary | ICD-10-CM | POA: Diagnosis not present

## 2017-12-17 DIAGNOSIS — I714 Abdominal aortic aneurysm, without rupture: Secondary | ICD-10-CM | POA: Diagnosis not present

## 2017-12-26 ENCOUNTER — Ambulatory Visit (INDEPENDENT_AMBULATORY_CARE_PROVIDER_SITE_OTHER): Payer: Medicare Other

## 2017-12-26 DIAGNOSIS — Z23 Encounter for immunization: Secondary | ICD-10-CM | POA: Diagnosis not present

## 2017-12-29 DIAGNOSIS — M79674 Pain in right toe(s): Secondary | ICD-10-CM | POA: Diagnosis not present

## 2017-12-29 DIAGNOSIS — M79675 Pain in left toe(s): Secondary | ICD-10-CM | POA: Diagnosis not present

## 2017-12-29 DIAGNOSIS — B351 Tinea unguium: Secondary | ICD-10-CM | POA: Diagnosis not present

## 2018-03-10 ENCOUNTER — Ambulatory Visit: Payer: Medicare Other | Admitting: Family Medicine

## 2018-03-18 DIAGNOSIS — H9202 Otalgia, left ear: Secondary | ICD-10-CM | POA: Diagnosis not present

## 2018-03-30 ENCOUNTER — Other Ambulatory Visit: Payer: Self-pay | Admitting: Family Medicine

## 2018-03-30 DIAGNOSIS — F419 Anxiety disorder, unspecified: Secondary | ICD-10-CM

## 2018-03-30 DIAGNOSIS — F5105 Insomnia due to other mental disorder: Secondary | ICD-10-CM

## 2018-03-30 DIAGNOSIS — F99 Mental disorder, not otherwise specified: Secondary | ICD-10-CM

## 2018-04-02 ENCOUNTER — Other Ambulatory Visit: Payer: Self-pay | Admitting: Family Medicine

## 2018-04-09 ENCOUNTER — Ambulatory Visit (INDEPENDENT_AMBULATORY_CARE_PROVIDER_SITE_OTHER): Payer: Medicare Other | Admitting: Family Medicine

## 2018-04-09 ENCOUNTER — Other Ambulatory Visit: Payer: Self-pay

## 2018-04-09 ENCOUNTER — Encounter: Payer: Self-pay | Admitting: Family Medicine

## 2018-04-09 VITALS — BP 145/72 | HR 53 | Temp 97.2°F | Resp 16 | Ht 71.0 in | Wt 220.2 lb

## 2018-04-09 DIAGNOSIS — I10 Essential (primary) hypertension: Secondary | ICD-10-CM | POA: Diagnosis not present

## 2018-04-09 DIAGNOSIS — R7303 Prediabetes: Secondary | ICD-10-CM | POA: Diagnosis not present

## 2018-04-09 DIAGNOSIS — R809 Proteinuria, unspecified: Secondary | ICD-10-CM | POA: Diagnosis not present

## 2018-04-09 DIAGNOSIS — J432 Centrilobular emphysema: Secondary | ICD-10-CM

## 2018-04-09 LAB — POCT GLYCOSYLATED HEMOGLOBIN (HGB A1C): HEMOGLOBIN A1C: 6.3 % — AB (ref 4.0–5.6)

## 2018-04-09 MED ORDER — LOSARTAN POTASSIUM 25 MG PO TABS: 25.0000 mg | ORAL_TABLET | Freq: Every day | ORAL | 1 refills | Status: DC

## 2018-04-09 NOTE — Patient Instructions (Addendum)
Thank you for coming to the office today.  Please call your Medicare Insurance Plan to speak with benefits and ask what the cost and coverage is for a "Annual Physical" (performed by your doctor)  - Due to changes with Medicare and the Medicare advantage plans, we need to confirm this BEFORE we perform a physical and send out the charge. - All Medicare plans should cover an "Annual Medicare Wellness" (performed by a nurse health advisor, not doctor) - this is FREE and can be done once a year  ----------------------------------------------------  Try to reduce carb and starch / potatoes / sugar in next 3 months.  Recent Labs    05/26/17 1105 04/09/18 1135  HGBA1C 6.1* 6.3*    Start Losartan 25mg  once daily for blood pressure and to protect kidney to reduce protein.  DUE for FASTING BLOOD WORK (no food or drink after midnight before the lab appointment, only water or coffee without cream/sugar on the morning of)  SCHEDULE "Lab Only" visit in the morning at the clinic for lab draw in 3 MONTHS   - Make sure Lab Only appointment is at about 1 week before your next appointment, so that results will be available  For Lab Results, once available within 2-3 days of blood draw, you can can log in to MyChart online to view your results and a brief explanation. Also, we can discuss results at next follow-up visit.   Please schedule a Follow-up Appointment to: Return in about 3 months (around 07/08/2018) for Annual Physical.  If you have any other questions or concerns, please feel free to call the office or send a message through [place]. You may also schedule an earlier appointment if necessary.  Additionally, you may be receiving a survey about your experience at our office within a few days to 1 week by e-mail or mail. We value your feedback.  [name], DO [place]

## 2018-04-09 NOTE — Progress Notes (Signed)
Subjective:    Patient ID: Charles Ewing, male    DOB: 07-17-1940, 78 y.o.   MRN: [id number]  Charles Ewing is a 78 y.o. male presenting on 04/09/2018 for PreDM and COPD   HPI   Pre-Diabetes Known Pre-Diabetes in past with elevated A1c, recent trend up to A1c 6.4, now today improved to 6.1 CBGs:checks at home, infrequently, but he has readings ranging mid 100s, avg 140-150, high of 200, no hypoglycemia or low sugars Meds:Never on meds Currentlynot onACEi / ARB - prior elevated microalbumin Lifestyle: - Diet (Tries to improve diet at times, but still admits poor diet, high carb, often eats potatoes) - Exercise (Limited - due to knee pain) - Fam history of DM Denies hypoglycemia, polyuria, visual changes, numbness or tingling.  Centrilobular Emphysema (COPD) Currently doing well. No new concern or recent flare up. He has maintenance therapy. Active smoker. Not ready to quit  CHRONIC HTN: Reports no new concerns. Home BP avg 130-140 Current Meds - Amlodipine 5mg  daily, Metoprolol XL 50mg  daily Reports good compliance, took meds today. Tolerating well, w/o complaints. Denies CP, dyspnea, HA, edema, dizziness / lightheadedness   Additional updates  [facility] Dr Tami Ribas - previously treated by steroids from ENT - improved his arthritis  [facility]  Dr Ronnald Ramp, knees, recommended future replacement.     Depression screen [facility] 2/9 04/09/2018 04/09/2018 05/26/2017  Decreased Interest 0 0 0  Down, Depressed, Hopeless 0 0 0  PHQ - 2 Score 0 0 0    Social History   Tobacco Use  . Smoking status: Current Every Day Smoker    Packs/day: 1.00  . Smokeless tobacco: Current User  Substance Use Topics  . Alcohol use: No  . Drug use: No    Review of Systems Per HPI unless specifically indicated above     Objective:    BP (!) 145/72   Pulse (!) 53   Temp (!) 97.2 F (36.2 C) (Oral)   Resp 16   Ht 5\' 11"  (1.803 m)   Wt 220 lb 3.2 oz (99.9 kg)   SpO2 98%   BMI  30.71 kg/m   Wt Readings from Last 3 Encounters:  04/09/18 220 lb 3.2 oz (99.9 kg)  11/04/17 220 lb (99.8 kg)  09/25/17 224 lb 9.6 oz (101.9 kg)    Physical Exam Vitals signs and nursing note reviewed.  Constitutional:      General: He is not in acute distress.    Appearance: He is well-developed. He is not diaphoretic.     Comments: Well-appearing, comfortable, cooperative  HENT:     Head: Normocephalic and atraumatic.  Eyes:     General:        Right eye: No discharge.        Left eye: No discharge.     Conjunctiva/sclera: Conjunctivae normal.  Neck:     Musculoskeletal: Normal range of motion and neck supple.     Thyroid: No thyromegaly.  Cardiovascular:     Rate and Rhythm: Normal rate and regular rhythm.     Heart sounds: Normal heart sounds. No murmur.  Pulmonary:     Effort: Pulmonary effort is normal. No respiratory distress.     Breath sounds: Normal breath sounds. No wheezing or rales.  Musculoskeletal: Normal range of motion.  Lymphadenopathy:     Cervical: No cervical adenopathy.  Skin:    General: Skin is warm and dry.     Findings: No erythema or rash.  Neurological:  Mental Status: He is alert and oriented to person, place, and time.  Psychiatric:        Behavior: Behavior normal.     Comments: Well groomed, good eye contact, normal speech and thoughts    Recent Labs    05/26/17 1105 04/09/18 1135  HGBA1C 6.1* 6.3*    Results for orders placed or performed in visit on 04/09/18  POCT glycosylated hemoglobin (Hb A1C)  Result Value Ref Range   Hemoglobin A1C 6.3 (A) 4.0 - 5.6 %      Assessment & Plan:   Problem List Items Addressed This Visit    Centrilobular emphysema ([place])    Stable COPD without flare On Flovent maintenance      Hypertension    Mildly elevated SBP, but overall controlled HTN - Home BP readings not available today Complication with orthostatic hypotension, CAD, AAA    Plan:  ADD Losartan 25mg  daily low dose for  microalbuminuria and help BP 1. Continue current BP regimen - Amlodipine 5mg , Metoprolol XL 50mg  daily - FUTURE consider reduce or STOP Amlodipine if low 2. Encourage improved lifestyle - low sodium diet, regular exercise 3. Continue monitor BP outside office, bring readings to next visit, if persistently >140/90 or new symptoms notify office sooner      Relevant Medications   losartan (COZAAR) 25 MG tablet   Prediabetes - Primary    Elevated A1c up to 6.3 prior 6.1, concern with poor lifestyle high risk progression to DM Concern with obesity, HTN, HLD Fam history DM  Plan:  1. Continue Metformin XR 500mg  daily 2. Encourage improved lifestyle - low carb, low sugar diet, reduce portion size, continue improving regular exercise if able - START Losartan 25mg  daily due to microalbuminuria 3. Follow-up 3 months annual      Relevant Orders   POCT glycosylated hemoglobin (Hb A1C) (Completed)    Other Visit Diagnoses    Microalbuminuria       Relevant Medications   losartan (COZAAR) 25 MG tablet      Meds ordered this encounter  Medications  . losartan (COZAAR) 25 MG tablet    Sig: Take 1 tablet (25 mg total) by mouth daily.    Dispense:  90 tablet    Refill:  1      Follow up plan: Return in about 3 months (around 07/08/2018) for Annual Physical.  Future labs ordered for 06/2018  [name], [facility] 04/09/2018, 11:33 AM

## 2018-04-10 ENCOUNTER — Other Ambulatory Visit: Payer: Self-pay | Admitting: Family Medicine

## 2018-04-10 DIAGNOSIS — B351 Tinea unguium: Secondary | ICD-10-CM | POA: Diagnosis not present

## 2018-04-10 DIAGNOSIS — N401 Enlarged prostate with lower urinary tract symptoms: Secondary | ICD-10-CM

## 2018-04-10 DIAGNOSIS — R7303 Prediabetes: Secondary | ICD-10-CM

## 2018-04-10 DIAGNOSIS — N138 Other obstructive and reflux uropathy: Secondary | ICD-10-CM

## 2018-04-10 DIAGNOSIS — J432 Centrilobular emphysema: Secondary | ICD-10-CM

## 2018-04-10 DIAGNOSIS — R972 Elevated prostate specific antigen [PSA]: Secondary | ICD-10-CM

## 2018-04-10 DIAGNOSIS — M79675 Pain in left toe(s): Secondary | ICD-10-CM | POA: Diagnosis not present

## 2018-04-10 DIAGNOSIS — E785 Hyperlipidemia, unspecified: Secondary | ICD-10-CM

## 2018-04-10 DIAGNOSIS — Z0000 Encounter for general adult medical examination without abnormal findings: Secondary | ICD-10-CM

## 2018-04-10 DIAGNOSIS — M79674 Pain in right toe(s): Secondary | ICD-10-CM | POA: Diagnosis not present

## 2018-04-10 DIAGNOSIS — I10 Essential (primary) hypertension: Secondary | ICD-10-CM

## 2018-04-10 NOTE — Assessment & Plan Note (Signed)
Stable COPD without flare On Flovent maintenance

## 2018-04-10 NOTE — Assessment & Plan Note (Signed)
Elevated A1c up to 6.3 prior 6.1, concern with poor lifestyle high risk progression to DM Concern with obesity, HTN, HLD Fam history DM  Plan:  1. Continue Metformin XR 500mg  daily 2. Encourage improved lifestyle - low carb, low sugar diet, reduce portion size, continue improving regular exercise if able - START Losartan 25mg  daily due to microalbuminuria 3. Follow-up 3 months annual

## 2018-04-10 NOTE — Assessment & Plan Note (Addendum)
Mildly elevated SBP, but overall controlled HTN - Home BP readings not available today Complication with orthostatic hypotension, CAD, AAA    Plan:  ADD Losartan 25mg  daily low dose for microalbuminuria and help BP 1. Continue current BP regimen - Amlodipine 5mg , Metoprolol XL 50mg  daily - FUTURE consider reduce or STOP Amlodipine if low 2. Encourage improved lifestyle - low sodium diet, regular exercise 3. Continue monitor BP outside office, bring readings to next visit, if persistently >140/90 or new symptoms notify office sooner

## 2018-05-05 ENCOUNTER — Other Ambulatory Visit: Payer: Self-pay | Admitting: Family Medicine

## 2018-05-05 DIAGNOSIS — R7303 Prediabetes: Secondary | ICD-10-CM

## 2018-07-09 DIAGNOSIS — I10 Essential (primary) hypertension: Secondary | ICD-10-CM | POA: Diagnosis not present

## 2018-07-09 DIAGNOSIS — I714 Abdominal aortic aneurysm, without rupture: Secondary | ICD-10-CM | POA: Diagnosis not present

## 2018-07-09 DIAGNOSIS — R600 Localized edema: Secondary | ICD-10-CM | POA: Diagnosis not present

## 2018-07-09 DIAGNOSIS — E785 Hyperlipidemia, unspecified: Secondary | ICD-10-CM | POA: Diagnosis not present

## 2018-07-09 DIAGNOSIS — I2510 Atherosclerotic heart disease of native coronary artery without angina pectoris: Secondary | ICD-10-CM | POA: Diagnosis not present

## 2018-07-13 DIAGNOSIS — M79674 Pain in right toe(s): Secondary | ICD-10-CM | POA: Diagnosis not present

## 2018-07-13 DIAGNOSIS — B351 Tinea unguium: Secondary | ICD-10-CM | POA: Diagnosis not present

## 2018-07-13 DIAGNOSIS — M79675 Pain in left toe(s): Secondary | ICD-10-CM | POA: Diagnosis not present

## 2018-08-26 ENCOUNTER — Other Ambulatory Visit: Payer: Self-pay

## 2018-08-26 ENCOUNTER — Ambulatory Visit (INDEPENDENT_AMBULATORY_CARE_PROVIDER_SITE_OTHER): Payer: Medicare Other | Admitting: Family Medicine

## 2018-08-26 ENCOUNTER — Encounter: Payer: Self-pay | Admitting: Family Medicine

## 2018-08-26 VITALS — BP 134/64 | HR 56 | Temp 97.7°F | Resp 16 | Ht 71.0 in | Wt 218.8 lb

## 2018-08-26 DIAGNOSIS — L853 Xerosis cutis: Secondary | ICD-10-CM | POA: Diagnosis not present

## 2018-08-26 DIAGNOSIS — D239 Other benign neoplasm of skin, unspecified: Secondary | ICD-10-CM | POA: Diagnosis not present

## 2018-08-26 DIAGNOSIS — L819 Disorder of pigmentation, unspecified: Secondary | ICD-10-CM

## 2018-08-26 DIAGNOSIS — L820 Inflamed seborrheic keratosis: Secondary | ICD-10-CM

## 2018-08-26 DIAGNOSIS — Z85828 Personal history of other malignant neoplasm of skin: Secondary | ICD-10-CM

## 2018-08-26 MED ORDER — TRIAMCINOLONE ACETONIDE 0.5 % EX CREA: 1.0000 "application " | TOPICAL_CREAM | Freq: Two times a day (BID) | CUTANEOUS | 1 refills | Status: DC

## 2018-08-26 NOTE — Patient Instructions (Addendum)
Thank you for coming to the office today.  Referral to Dr Nehemiah Massed  [facility]   [place], [place] [address] Hours: 8AM-5PM Phone: [phone]  ------  Trial on topical triamcinolone for itching and irritation. Would hold on using alcohol now that can dry out skin.  The rash looks most consistent with eczema, this can flare up and get worse due to a variety of factors (excessive dry skin from bathing/showering, soaps, cold weather / indoor heaters, outdoor exposures).  Use the topical steroid creams twice a day for up to 1 week, maximum duration of use per one flare is 10 to 14 days, then STOP using it and allow skin to recover. Caution with over-use may cause lightening of the skin.   Hydrocortisone on face only and the Triamcinolone / Kenalog on body only.  Tips to reduce Eczema Flares: For baths/showers, limit bathing to every other day if you can (max 1 x daily)  Use a gentle, unscented soap and lukewarm water (hot water is most irritating to skin) Never scrub skin with too much pressure, this causes more irritation. Pat skin dry, then leave it slightly damp. DO NOT scrub it dry. Apply steroid cream to skin and rub in all the way, wait 15 min, then apply a daily moisturizer (Vaseline, Eucerin, Aveeno). Continue daily moisturizer every day of the year (even after flare is resolved) - If you have eczema on hands or dry hands, recommend wearing any type of gloves overnight (cloth, fabric, or even nitrile/latex) to improve effect of topical moisturizer  If develops redness, honey colored crust oozing, drainage of pus, bleeding, or redness / swelling, pain, please return for re-evaluation, may have become infected after scratching.   Please schedule a Follow-up Appointment to: Return if symptoms worsen or fail to improve, for skin lesions.  If you have any other questions or concerns, please feel free to call the office or send a message through [place]. You  may also schedule an earlier appointment if necessary.  Additionally, you may be receiving a survey about your experience at our office within a few days to 1 week by e-mail or mail. We value your feedback.  [name], DO [facility]

## 2018-08-26 NOTE — Progress Notes (Signed)
Subjective:    Patient ID: Charles Ewing, male    DOB: Aug 14, 1940, 78 y.o.   MRN: [id number]  Charles Ewing is a 78 y.o. male presenting on 08/26/2018 for Rash   HPI   Skin Lesions / Rash Reports worsening problem now over few weeks or more multiple skin lesions on forearms bothering him, itching and itchy irritated, he used topical alcohol to rub on it and it temporarily help but still bothering him. Not using any other steroid cream or OTC medication. He has history of skin cancer in past on forehead removed at [place]. Unsure if melanoma. Denies any fever chills sweats, numbness tingling weakness   Depression screen [facility] 2/9 08/26/2018 04/09/2018 04/09/2018  Decreased Interest 0 0 0  Down, Depressed, Hopeless 0 0 0  PHQ - 2 Score 0 0 0    Social History   Tobacco Use  . Smoking status: Current Every Day Smoker    Packs/day: 1.00  . Smokeless tobacco: Current User  Substance Use Topics  . Alcohol use: No  . Drug use: No    Review of Systems Per HPI unless specifically indicated above     Objective:    BP 134/64   Pulse (!) 56   Temp 97.7 F (36.5 C) (Oral)   Resp 16   Ht 5\' 11"  (1.803 m)   Wt 218 lb 12.8 oz (99.2 kg)   BMI 30.52 kg/m   Wt Readings from Last 3 Encounters:  08/26/18 218 lb 12.8 oz (99.2 kg)  04/09/18 220 lb 3.2 oz (99.9 kg)  11/04/17 220 lb (99.8 kg)    Physical Exam Vitals signs and nursing note reviewed.  Constitutional:      General: He is not in acute distress.    Appearance: He is well-developed. He is not diaphoretic.     Comments: Well-appearing, comfortable, cooperative  HENT:     Head: Normocephalic and atraumatic.  Eyes:     General:        Right eye: No discharge.        Left eye: No discharge.     Conjunctiva/sclera: Conjunctivae normal.  Cardiovascular:     Rate and Rhythm: Normal rate.  Pulmonary:     Effort: Pulmonary effort is normal.  Skin:    General: Skin is warm and dry.     Findings: Bruising present. No erythema  or rash.     Comments: Bilateral forearms with multiple skin lesions - some appearance of seborrheic keratosis that are dry and slightly inflamed - spot on left forearm with keratotic horn - Few non healing spots and areas of scar tissue - Abnormality left forehead scar tissue without any ulceration  Left thigh larger palpable deeper soft mobile mass non tender today  Neurological:     Mental Status: He is alert and oriented to person, place, and time.  Psychiatric:        Behavior: Behavior normal.     Comments: Well groomed, good eye contact, normal speech and thoughts        Results for orders placed or performed in visit on 04/09/18  POCT glycosylated hemoglobin (Hb A1C)  Result Value Ref Range   Hemoglobin A1C 6.3 (A) 4.0 - 5.6 %      Assessment & Plan:   Problem List Items Addressed This Visit    None    Visit Diagnoses    Keratosis, inflamed seborrheic    -  Primary   Relevant Medications   triamcinolone cream (  KENALOG) 0.5 %   Other Relevant Orders   Ambulatory referral to Dermatology   Change in multiple pigmented skin lesions       Relevant Orders   Ambulatory referral to Dermatology   [place]       Relevant Orders   Ambulatory referral to Dermatology   History of skin cancer       Relevant Orders   Ambulatory referral to Dermatology   Dry skin dermatitis       Relevant Medications   triamcinolone cream (KENALOG) 0.5 %      Clinically with multiple abnormal skin lesions on forearms Some easy bruising thin skin Most likely variety of benign skin lesions including inflamed SKs, some suspicious lesions and history of skin cancer, may warrant further skin evaluation and possibly would benefit from cryotherapy or biopsy on certani lesions  Refer to [facility] on Triamcilone cream 0.5% cream BID 1-2 weeks PRN itching and dry skin dermatitis Stop Alcohol topical at this time, seems drying out skin Follow-up  Meds ordered  this encounter  Medications  . triamcinolone cream (KENALOG) 0.5 %    Sig: Apply 1 application topically 2 (two) times daily. To affected areas, for up to 2 weeks.    Dispense:  30 g    Refill:  1    Follow up plan: Return if symptoms worsen or fail to improve, for skin lesions.  [name], [facility] 08/26/2018, 2:04 PM

## 2018-09-02 ENCOUNTER — Other Ambulatory Visit: Payer: Self-pay | Admitting: Family Medicine

## 2018-09-02 DIAGNOSIS — R7303 Prediabetes: Secondary | ICD-10-CM

## 2018-09-03 ENCOUNTER — Other Ambulatory Visit: Payer: Self-pay | Admitting: Family Medicine

## 2018-09-03 DIAGNOSIS — B370 Candidal stomatitis: Secondary | ICD-10-CM

## 2018-09-03 DIAGNOSIS — Z7951 Long term (current) use of inhaled steroids: Secondary | ICD-10-CM

## 2018-09-08 ENCOUNTER — Other Ambulatory Visit: Payer: Self-pay | Admitting: Family Medicine

## 2018-09-08 DIAGNOSIS — I10 Essential (primary) hypertension: Secondary | ICD-10-CM

## 2018-09-08 DIAGNOSIS — K5901 Slow transit constipation: Secondary | ICD-10-CM

## 2018-09-08 DIAGNOSIS — R809 Proteinuria, unspecified: Secondary | ICD-10-CM

## 2018-09-08 DIAGNOSIS — R109 Unspecified abdominal pain: Secondary | ICD-10-CM

## 2018-09-21 ENCOUNTER — Other Ambulatory Visit: Payer: Self-pay | Admitting: Family Medicine

## 2018-09-21 DIAGNOSIS — J432 Centrilobular emphysema: Secondary | ICD-10-CM

## 2018-09-22 DIAGNOSIS — M6281 Muscle weakness (generalized): Secondary | ICD-10-CM | POA: Insufficient documentation

## 2018-10-06 ENCOUNTER — Other Ambulatory Visit: Payer: Self-pay | Admitting: Family Medicine

## 2018-10-06 DIAGNOSIS — F99 Mental disorder, not otherwise specified: Secondary | ICD-10-CM

## 2018-10-06 DIAGNOSIS — F5105 Insomnia due to other mental disorder: Secondary | ICD-10-CM

## 2018-10-06 DIAGNOSIS — F419 Anxiety disorder, unspecified: Secondary | ICD-10-CM

## 2018-10-12 DIAGNOSIS — M6281 Muscle weakness (generalized): Secondary | ICD-10-CM | POA: Diagnosis not present

## 2018-10-20 DIAGNOSIS — M5416 Radiculopathy, lumbar region: Secondary | ICD-10-CM | POA: Diagnosis not present

## 2018-10-20 DIAGNOSIS — M545 Low back pain: Secondary | ICD-10-CM | POA: Diagnosis not present

## 2018-10-27 DIAGNOSIS — M5416 Radiculopathy, lumbar region: Secondary | ICD-10-CM | POA: Diagnosis not present

## 2018-10-27 DIAGNOSIS — M545 Low back pain: Secondary | ICD-10-CM | POA: Diagnosis not present

## 2018-10-28 DIAGNOSIS — M79675 Pain in left toe(s): Secondary | ICD-10-CM | POA: Diagnosis not present

## 2018-10-28 DIAGNOSIS — M79674 Pain in right toe(s): Secondary | ICD-10-CM | POA: Diagnosis not present

## 2018-10-28 DIAGNOSIS — B351 Tinea unguium: Secondary | ICD-10-CM | POA: Diagnosis not present

## 2018-11-03 DIAGNOSIS — M545 Low back pain: Secondary | ICD-10-CM | POA: Diagnosis not present

## 2018-11-03 DIAGNOSIS — M5416 Radiculopathy, lumbar region: Secondary | ICD-10-CM | POA: Diagnosis not present

## 2018-11-09 DIAGNOSIS — J449 Chronic obstructive pulmonary disease, unspecified: Secondary | ICD-10-CM | POA: Diagnosis not present

## 2018-11-09 DIAGNOSIS — E785 Hyperlipidemia, unspecified: Secondary | ICD-10-CM | POA: Diagnosis not present

## 2018-11-09 DIAGNOSIS — I714 Abdominal aortic aneurysm, without rupture: Secondary | ICD-10-CM | POA: Diagnosis not present

## 2018-11-09 DIAGNOSIS — I2510 Atherosclerotic heart disease of native coronary artery without angina pectoris: Secondary | ICD-10-CM | POA: Diagnosis not present

## 2018-11-09 DIAGNOSIS — I10 Essential (primary) hypertension: Secondary | ICD-10-CM | POA: Diagnosis not present

## 2018-11-10 DIAGNOSIS — M5416 Radiculopathy, lumbar region: Secondary | ICD-10-CM | POA: Diagnosis not present

## 2018-11-10 DIAGNOSIS — M545 Low back pain: Secondary | ICD-10-CM | POA: Diagnosis not present

## 2018-11-16 DIAGNOSIS — M5416 Radiculopathy, lumbar region: Secondary | ICD-10-CM | POA: Diagnosis not present

## 2018-11-16 DIAGNOSIS — M545 Low back pain: Secondary | ICD-10-CM | POA: Diagnosis not present

## 2018-11-30 ENCOUNTER — Other Ambulatory Visit: Payer: Self-pay | Admitting: Urology

## 2018-11-30 DIAGNOSIS — N401 Enlarged prostate with lower urinary tract symptoms: Secondary | ICD-10-CM

## 2018-12-21 ENCOUNTER — Other Ambulatory Visit: Payer: Self-pay

## 2018-12-21 ENCOUNTER — Ambulatory Visit (INDEPENDENT_AMBULATORY_CARE_PROVIDER_SITE_OTHER): Payer: Medicare Other | Admitting: Family Medicine

## 2018-12-21 DIAGNOSIS — Z23 Encounter for immunization: Secondary | ICD-10-CM | POA: Diagnosis not present

## 2018-12-21 NOTE — Progress Notes (Signed)
Flu vaccine given today, see documentation. Encounter was listed on my schedule today. Closed chart.  [name], [facility] 12/21/2018, 4:32 PM

## 2019-01-05 ENCOUNTER — Telehealth: Payer: Self-pay | Admitting: Family Medicine

## 2019-01-05 NOTE — Telephone Encounter (Signed)
I called the patient to schedule AWV-I with Tiffany.  There was no answer, and the voicemail hasn't been set up yet.

## 2019-01-06 ENCOUNTER — Other Ambulatory Visit: Payer: Self-pay | Admitting: Family Medicine

## 2019-01-06 DIAGNOSIS — R7303 Prediabetes: Secondary | ICD-10-CM

## 2019-01-06 DIAGNOSIS — N138 Other obstructive and reflux uropathy: Secondary | ICD-10-CM

## 2019-01-06 DIAGNOSIS — E785 Hyperlipidemia, unspecified: Secondary | ICD-10-CM

## 2019-01-06 DIAGNOSIS — I10 Essential (primary) hypertension: Secondary | ICD-10-CM

## 2019-01-06 DIAGNOSIS — Z0000 Encounter for general adult medical examination without abnormal findings: Secondary | ICD-10-CM

## 2019-01-06 DIAGNOSIS — R972 Elevated prostate specific antigen [PSA]: Secondary | ICD-10-CM

## 2019-01-11 ENCOUNTER — Other Ambulatory Visit: Payer: Medicare Other

## 2019-01-11 ENCOUNTER — Other Ambulatory Visit: Payer: Self-pay

## 2019-01-11 DIAGNOSIS — R7303 Prediabetes: Secondary | ICD-10-CM | POA: Diagnosis not present

## 2019-01-11 DIAGNOSIS — E785 Hyperlipidemia, unspecified: Secondary | ICD-10-CM | POA: Diagnosis not present

## 2019-01-11 DIAGNOSIS — I10 Essential (primary) hypertension: Secondary | ICD-10-CM | POA: Diagnosis not present

## 2019-01-12 LAB — CBC WITH DIFFERENTIAL/PLATELET
Absolute Monocytes: 579 cells/uL (ref 200–950)
Basophils Absolute: 36 cells/uL (ref 0–200)
Basophils Relative: 0.4 %
Eosinophils Absolute: 231 cells/uL (ref 15–500)
Eosinophils Relative: 2.6 %
HCT: 48.8 % (ref 38.5–50.0)
Hemoglobin: 16.4 g/dL (ref 13.2–17.1)
Lymphs Abs: 2661 cells/uL (ref 850–3900)
MCH: 30.6 pg (ref 27.0–33.0)
MCHC: 33.6 g/dL (ref 32.0–36.0)
MCV: 91 fL (ref 80.0–100.0)
MPV: 10.7 fL (ref 7.5–12.5)
Monocytes Relative: 6.5 %
Neutro Abs: 5393 cells/uL (ref 1500–7800)
Neutrophils Relative %: 60.6 %
Platelets: 263 10*3/uL (ref 140–400)
RBC: 5.36 10*6/uL (ref 4.20–5.80)
RDW: 13.7 % (ref 11.0–15.0)
Total Lymphocyte: 29.9 %
WBC: 8.9 10*3/uL (ref 3.8–10.8)

## 2019-01-12 LAB — COMPLETE METABOLIC PANEL WITH GFR
AG Ratio: 2 (calc) (ref 1.0–2.5)
ALT: 15 U/L (ref 9–46)
AST: 15 U/L (ref 10–35)
Albumin: 4 g/dL (ref 3.6–5.1)
Alkaline phosphatase (APISO): 56 U/L (ref 35–144)
BUN/Creatinine Ratio: 11 (calc) (ref 6–22)
BUN: 14 mg/dL (ref 7–25)
CO2: 27 mmol/L (ref 20–32)
Calcium: 9.1 mg/dL (ref 8.6–10.3)
Chloride: 103 mmol/L (ref 98–110)
Creat: 1.25 mg/dL — ABNORMAL HIGH (ref 0.70–1.18)
GFR, Est African American: 64 mL/min/{1.73_m2} (ref >=60)
GFR, Est Non African American: 55 mL/min/{1.73_m2} — ABNORMAL LOW (ref >=60)
Globulin: 2 g/dL (calc) (ref 1.9–3.7)
Glucose, Bld: 102 mg/dL (ref 65–139)
Potassium: 4.4 mmol/L (ref 3.5–5.3)
Sodium: 139 mmol/L (ref 135–146)
Total Bilirubin: 0.6 mg/dL (ref 0.2–1.2)
Total Protein: 6 g/dL — ABNORMAL LOW (ref 6.1–8.1)

## 2019-01-12 LAB — LIPID PANEL
Cholesterol: 117 mg/dL (ref <200)
HDL: 35 mg/dL — ABNORMAL LOW (ref >=40)
LDL Cholesterol (Calc): 50 mg/dL (calc)
Non-HDL Cholesterol (Calc): 82 mg/dL (calc) (ref <130)
Total CHOL/HDL Ratio: 3.3 (calc) (ref <5.0)
Triglycerides: 308 mg/dL — ABNORMAL HIGH (ref <150)

## 2019-01-12 LAB — HEMOGLOBIN A1C
Hgb A1c MFr Bld: 5.6 % of total Hgb (ref <5.7)
Mean Plasma Glucose: 114 (calc)
eAG (mmol/L): 6.3 (calc)

## 2019-01-12 LAB — PSA: PSA: 6.8 ng/mL — ABNORMAL HIGH (ref <=4.0)

## 2019-01-18 ENCOUNTER — Other Ambulatory Visit: Payer: Self-pay

## 2019-01-18 ENCOUNTER — Other Ambulatory Visit: Payer: Self-pay | Admitting: Family Medicine

## 2019-01-18 ENCOUNTER — Ambulatory Visit (INDEPENDENT_AMBULATORY_CARE_PROVIDER_SITE_OTHER): Payer: Medicare Other | Admitting: Family Medicine

## 2019-01-18 ENCOUNTER — Encounter: Payer: Self-pay | Admitting: Family Medicine

## 2019-01-18 VITALS — BP 132/66 | HR 53 | Temp 97.5°F | Resp 16 | Ht 71.0 in | Wt 214.0 lb

## 2019-01-18 DIAGNOSIS — F5105 Insomnia due to other mental disorder: Secondary | ICD-10-CM

## 2019-01-18 DIAGNOSIS — I25118 Atherosclerotic heart disease of native coronary artery with other forms of angina pectoris: Secondary | ICD-10-CM

## 2019-01-18 DIAGNOSIS — R7303 Prediabetes: Secondary | ICD-10-CM | POA: Diagnosis not present

## 2019-01-18 DIAGNOSIS — I129 Hypertensive chronic kidney disease with stage 1 through stage 4 chronic kidney disease, or unspecified chronic kidney disease: Secondary | ICD-10-CM

## 2019-01-18 DIAGNOSIS — Z0000 Encounter for general adult medical examination without abnormal findings: Secondary | ICD-10-CM

## 2019-01-18 DIAGNOSIS — N401 Enlarged prostate with lower urinary tract symptoms: Secondary | ICD-10-CM

## 2019-01-18 DIAGNOSIS — I714 Abdominal aortic aneurysm, without rupture, unspecified: Secondary | ICD-10-CM

## 2019-01-18 DIAGNOSIS — N183 Chronic kidney disease, stage 3 unspecified: Secondary | ICD-10-CM

## 2019-01-18 DIAGNOSIS — J432 Centrilobular emphysema: Secondary | ICD-10-CM | POA: Diagnosis not present

## 2019-01-18 DIAGNOSIS — R972 Elevated prostate specific antigen [PSA]: Secondary | ICD-10-CM

## 2019-01-18 DIAGNOSIS — F99 Mental disorder, not otherwise specified: Secondary | ICD-10-CM

## 2019-01-18 DIAGNOSIS — F419 Anxiety disorder, unspecified: Secondary | ICD-10-CM

## 2019-01-18 DIAGNOSIS — N138 Other obstructive and reflux uropathy: Secondary | ICD-10-CM

## 2019-01-18 MED ORDER — LORAZEPAM 0.5 MG PO TABS: 0.5000 mg | ORAL_TABLET | Freq: Every evening | ORAL | 2 refills | Status: DC | PRN

## 2019-01-18 NOTE — Assessment & Plan Note (Signed)
Stable COPD without flare On Flovent Spiriva maintenance

## 2019-01-18 NOTE — Assessment & Plan Note (Signed)
Chronic BPH with some LUTS nocturia Previously followed by [facility] Has had elevated PSA 5-7 range Off tamsulosin now he declines to take and has not followed back up with Urology  Plan Elevated PSA see A&P - will repeat lab in 6 months, advised he could return to Urology sooner

## 2019-01-18 NOTE — Assessment & Plan Note (Signed)
Stable chronic problem >20 years, on BDZ, by history seems like prior history of possible PTSD contributing, with poor sleep - No long established with Psychiatry  Plan 1. Refilled chronic Lorazepam 0.5mg  1 time nightly PRN insomnia #30, +2 refills for 3 month supply - discussed risks of chronic BDZ therapy, concerns of dependence, withdrawal, abuse potential. He is aware not to consume alcohol, has not had problem with withdrawal before.

## 2019-01-18 NOTE — Assessment & Plan Note (Signed)
Likely secondary to chronic anxiety / possible PTSD, on chronic BDZ ativan with improvement, >20 years -See anxiety A&P - Refilled Lorazepam for 3 month supply

## 2019-01-18 NOTE — Assessment & Plan Note (Signed)
PSA trend 5 to 7 Now result is 6.8 Likely due to BPH but also has fam history prostate cancer  Advised we can refer back to Urology - return to [facility] otherwise he can repeat lab in 6 months, if improving can maintain, otherwise if elevated needs to see Urologist

## 2019-01-18 NOTE — Patient Instructions (Addendum)
Thank you for coming to the office today.  Refills Lorazepam sent.  Notify if need other refills.  Ordered Ultrasound for abdomen aorta - stay tuned.  Elevated PSA we can check again in 6 months.  Recent Labs    04/09/18 1135 01/11/19 0902  HGBA1C 6.3* 5.6     DUE for FASTING BLOOD WORK (no food or drink after midnight before the lab appointment, only water or coffee without cream/sugar on the morning of)  SCHEDULE "Lab Only" visit in the morning at the clinic for lab draw in 6 MONTHS   - Make sure Lab Only appointment is at about 1 week before your next appointment, so that results will be available  For Lab Results, once available within 2-3 days of blood draw, you can can log in to MyChart online to view your results and a brief explanation. Also, we can discuss results at next follow-up visit.   Please schedule a Follow-up Appointment to: Return in about 6 months (around 07/19/2019) for 6 month.  If you have any other questions or concerns, please feel free to call the office or send a message through [place]. You may also schedule an earlier appointment if necessary.  Additionally, you may be receiving a survey about your experience at our office within a few days to 1 week by e-mail or mail. We value your feedback.  [name], DO [place]

## 2019-01-18 NOTE — Assessment & Plan Note (Signed)
Previously stable AAA Now due for repeat US Last diam 3.3cm Previous [facility] On HTN meds  Plan order [place] Retroperitoneal complete for AAA surveillance, to be scheduled

## 2019-01-18 NOTE — Assessment & Plan Note (Signed)
Improved A1c down to 5.6 now on lifestyle improvement and Metformin Concern with obesity, HTN, HLD Fam history DM  Plan:  1. Continue Metformin XR 500mg  daily - future can hold or DC 2. Encourage improved lifestyle - low carb, low sugar diet, reduce portion size, continue improving regular exercise if able - On ARB

## 2019-01-18 NOTE — Progress Notes (Signed)
Subjective:    Patient ID: Charles Ewing, male    DOB: Jul 29, 1940, 78 y.o.   MRN: [id number]  Charles Ewing is a 78 y.o. male presenting on 01/18/2019 for Annual Exam   HPI   Here for Annual Physical and Lab Review.  Pre-Diabetes Known Pre-Diabetes. Last trend was improved from 6.3 down to 5.6 now. CBGs:checks at home, infrequently, but he has readings ranging mid 100s, avg 120-140, high of 200, no hypoglycemia or low sugars Meds:Metformin XR 588m daily Currentlynot onACEi / ARB - prior elevated microalbumin Lifestyle: - Diet (Tries to improve diet) - Exercise (Limited- due to knee pain) - Fam history of DM Denies hypoglycemia, polyuria, visual changes, numbness or tingling.  Centrilobular Emphysema (COPD) Currently doing well. No new concern or recent flare up. He has maintenance therapy. Spiriva, Flovent Active smoker. Not ready to quit  Chronic Anxiety Associated with PTSD Currently controlled on Lorazepam 0.571mnightly for insomnia and anxiety Due for refill  CHRONIC HTN: Reports no new concerns. Home BP avg 130-140 Current Meds - Amlodipine 74m10maily, Metoprolol XL 70m54mily. Losartan 274mg103morts good compliance, took meds today. Tolerating well, w/o complaints. Denies CP, dyspnea, HA, edema, dizziness / lightheadedness  Elevated PSA Prior trend with PSA 5 to 7 over past 3 years. Previously followed by [facility] but no longer going to see them. He has known BPH on Tamsulosin previously but has come off of this as well. He has fam history of prostate cancer. He prefers to repeat the PSA lab, was offered return back to Urology. - Last lab PSA 6.8  Abdominal Aortic Aneurysm Last imaging Retroperitoneal US diKoreaeter 3.3cm, 2017, next advised repeat in 3 years, now due for repeat. He is asymptomatic.   Health Maintenance: UTD Flu vaccine 2020  Dep[facility] screen [facility] 04/09/2018 04/09/2018  Decreased Interest 0 0 0  Down, Depressed, Hopeless  0 0 0  PHQ - 2 Score 0 0 0    Past Medical History:  Diagnosis Date  . Abdominal aortic aneurysm (AAA) (HCC) [email or web address]   . Arrhythmia   . Arthritis   . Colon polyp   . COPD (chronic obstructive pulmonary disease) (HCC) [place] Coronary artery disease   . Depression   . GERD (gastroesophageal reflux disease)   . Hyperlipidemia   . Hypertension   . Skin cancer   . Thrush    Past Surgical History:  Procedure Laterality Date  . Colon polyp surgery    . SKIN CANCER EXCISION    . TRANSURETHRAL RESECTION OF PROSTATE  2010   Social History   Socioeconomic History  . Marital status: Divorced    Spouse name: Not on file  . Number of children: Not on file  . Years of education: Not on file  . Highest education level: Not on file  Occupational History  . Not on file  Social Needs  . Financial resource strain: Not on file  . Food insecurity    Worry: Not on file    Inability: Not on file  . Transportation needs    Medical: Not on file    Non-medical: Not on file  Tobacco Use  . Smoking status: Current Every Day Smoker    Packs/day: 1.00  . Smokeless tobacco: Current User  Substance and Sexual Activity  . Alcohol use: No  . Drug use: No  . Sexual activity: Not on file  Lifestyle  . Physical activity  Days per week: Not on file    Minutes per session: Not on file  . Stress: Not on file  Relationships  . Social Herbalist on phone: Not on file    Gets together: Not on file    Attends religious service: Not on file    Active member of club or organization: Not on file    Attends meetings of clubs or organizations: Not on file    Relationship status: Not on file  . Intimate partner violence    Fear of current or ex partner: Not on file    Emotionally abused: Not on file    Physically abused: Not on file    Forced sexual activity: Not on file  Other Topics Concern  . Not on file  Social History Narrative  . Not on file   Family  History  Problem Relation Age of Onset  . Kidney cancer Mother   . Prostate cancer Neg Hx   . Bladder Cancer Neg Hx    Current Outpatient Medications on File Prior to Visit  Medication Sig  . albuterol (PROVENTIL HFA;VENTOLIN HFA) 108 (90 Base) MCG/ACT inhaler Inhale 2 puffs into the lungs every 6 (six) hours as needed for wheezing or shortness of breath.  Marland Kitchen amLODipine (NORVASC) 5 MG tablet   . aspirin EC 81 MG tablet Take 81 mg by mouth every evening.  . Blood Glucose Monitoring Suppl (ONE TOUCH ULTRA SYSTEM KIT) w/Device KIT 1 kit by Does not apply route once. CHeck blood glucose in the AM three times weekly.  Marland Kitchen dicyclomine (BENTYL) 10 MG capsule dicyclomine 10 mg capsule  . Elastic Bandages & Supports (MEDICAL COMPRESSION STOCKINGS) MISC 1 each by Does not apply route daily.  Marland Kitchen FLOVENT DISKUS 50 MCG/BLIST diskus inhaler INHALE 1 PUFF BY MOUTH INTO THE LUNGS 2 TIMES DAILY  . FLUoxetine (PROZAC) 20 MG capsule Take 20 mg by mouth 2 (two) times daily.  . fluticasone (FLONASE) 50 MCG/ACT nasal spray PLACE 2 SPRAYS INTO BOTH NOSTRILS DAILY.  Marland Kitchen glucose blood test strip Check blood glucose three times weekly.  . hydrochlorothiazide (HYDRODIURIL) 25 MG tablet   . Lancets (ONETOUCH ULTRASOFT) lancets Use as instructed  . loratadine (CLARITIN) 10 MG tablet Take 10 mg by mouth every evening.  Marland Kitchen losartan (COZAAR) 25 MG tablet TAKE 1 TABLET BY MOUTH DAILY  . metFORMIN (GLUCOPHAGE-XR) 500 MG 24 hr tablet TAKE 1 TABLET BY MOUTH DAILY WITH BREAKFAST  . metoprolol succinate (TOPROL-XL) 50 MG 24 hr tablet Take 1 tablet (50 mg total) by mouth daily. Take with or immediately following a meal.  . neomycin-polymyxin-hydrocortisone (CORTISPORIN) 3.5-10000-1 OTIC suspension   . nystatin (MYCOSTATIN) 100000 UNIT/ML suspension TAKE 5 mls (1 TEASPOONFUL) BY MOUTH 4 TIMES DAILY.  Marland Kitchen polyethylene glycol powder (GLYCOLAX/MIRALAX) 17 GM/SCOOP powder USE HALF UP TO 1 CAPFUL BY MOUTH DAILY FOR MAINTENANCE, IF ACUTE  WORSENING CONSTIPATION CAN INCREASE TO 2 CAPFULS DAILY OR 1 CAPFUL TWICE DA  . potassium chloride (MICRO-K) 10 MEQ CR capsule   . simvastatin (ZOCOR) 20 MG tablet Take 20 mg by mouth at bedtime.  Marland Kitchen tiotropium (SPIRIVA HANDIHALER) 18 MCG inhalation capsule Place 1 capsule (18 mcg total) into inhaler and inhale daily.  Marland Kitchen triamcinolone cream (KENALOG) 0.5 % Apply 1 application topically 2 (two) times daily. To affected areas, for up to 2 weeks.   No current facility-administered medications on file prior to visit.     Review of Systems  Constitutional: Negative for activity change,  appetite change, chills, diaphoresis, fatigue and fever.  HENT: Negative for congestion and hearing loss.   Eyes: Negative for visual disturbance.  Respiratory: Negative for apnea, cough, chest tightness, shortness of breath and wheezing.   Cardiovascular: Negative for chest pain, palpitations and leg swelling.  Gastrointestinal: Negative for abdominal pain, anal bleeding, blood in stool, constipation, diarrhea, nausea and vomiting.  Endocrine: Negative for cold intolerance.  Genitourinary: Negative for difficulty urinating, dysuria, frequency and hematuria.  Musculoskeletal: Negative for arthralgias, back pain and neck pain.  Skin: Negative for rash.  Allergic/Immunologic: Negative for environmental allergies.  Neurological: Negative for dizziness, weakness, light-headedness, numbness and headaches.  Hematological: Negative for adenopathy.  Psychiatric/Behavioral: Negative for behavioral problems, dysphoric mood and sleep disturbance. The patient is not nervous/anxious.    Per HPI unless specifically indicated above      Objective:    BP 132/66   Pulse (!) 53   Temp (!) 97.5 F (36.4 C) (Oral)   Resp 16   Ht _0  (1.803 m)   Wt 214 lb (97.1 kg)   BMI 29.85 kg/m   Wt Readings from Last 3 Encounters:  01/18/19 214 lb (97.1 kg)  08/26/18 218 lb 12.8 oz (99.2 kg)  04/09/18 220 lb 3.2 oz (99.9 kg)     Physical Exam Vitals signs and nursing note reviewed.  Constitutional:      General: He is not in acute distress.    Appearance: He is well-developed. He is not diaphoretic.     Comments: Well-appearing, comfortable, cooperative  HENT:     Head: Normocephalic and atraumatic.  Eyes:     General:        Right eye: No discharge.        Left eye: No discharge.     Conjunctiva/sclera: Conjunctivae normal.     Pupils: Pupils are equal, round, and reactive to light.  Neck:     Musculoskeletal: Normal range of motion and neck supple.     Thyroid: No thyromegaly.  Cardiovascular:     Rate and Rhythm: Normal rate and regular rhythm.     Heart sounds: Normal heart sounds. No murmur.  Pulmonary:     Effort: Pulmonary effort is normal. No respiratory distress.     Breath sounds: Normal breath sounds. No wheezing or rales.  Abdominal:     General: Bowel sounds are normal. There is no distension.     Palpations: Abdomen is soft. There is no mass.     Tenderness: There is no abdominal tenderness.  Musculoskeletal: Normal range of motion.        General: No tenderness.     Comments: Upper / Lower Extremities: - Normal muscle tone, strength bilateral upper extremities 5/5, lower extremities 5/5  Lymphadenopathy:     Cervical: No cervical adenopathy.  Skin:    General: Skin is warm and dry.     Findings: No erythema or rash.  Neurological:     Mental Status: He is alert and oriented to person, place, and time.     Comments: Distal sensation intact to light touch all extremities  Psychiatric:        Behavior: Behavior normal.     Comments: Well groomed, good eye contact, normal speech and thoughts      I have personally reviewed the radiology report from 06/22/15  CLINICAL DATA:  Abdominal aortic aneurysm.  EXAM: ULTRASOUND RETROPERITONEAL COMPLETE  TECHNIQUE: Ultrasound examination of the abdominal aorta was performed to evaluate for abdominal aortic aneurysm. The common iliac  arteries, IVC, and kidneys were also evaluated.  COMPARISON:  None.  FINDINGS: Abdominal Aorta  No aneurysm identified.  Maximum AP  Diameter:  3.3 cm proximally.  Maximum TRV  Diameter: 3.3 cm proximally.  Right Common Iliac Artery  No aneurysm identified.  Left Common Iliac Artery  No aneurysm identified.  IVC  No abnormality visualized.  Right Kidney  Length: 10.5 cm. Echogenicity within normal limits. No mass or hydronephrosis visualized.  Left Kidney  Length: 11.3 cm. Echogenicity within normal limits. No mass or hydronephrosis visualized.  IMPRESSION: 3.3 cm proximal abdominal aortic aneurysm. Recommend followup by ultrasound in 3 years. This recommendation follows ACR consensus guidelines: White Paper of the ACR Incidental Findings Committee II on Vascular Findings. J Am Coll Radiol 2013; 10:789-794.  Incidental note is made of fatty infiltration of the liver.   Electronically Signed   By: Marijo Conception, M.D.   On: 06/22/2015 15:26  Results for orders placed or performed in visit on 01/06/19  cmet with GFR physical  Result Value Ref Range   Glucose, Bld 102 65 - 139 mg/dL   BUN 14 7 - 25 mg/dL   Creat 1.25 (H) 0.70 - 1.18 mg/dL   GFR, Est Non African American 55 (L) > OR = 60 mL/min/1.31m   GFR, Est African American 64 > OR = 60 mL/min/1.725m  BUN/Creatinine Ratio 11 6 - 22 (calc)   Sodium 139 135 - 146 mmol/L   Potassium 4.4 3.5 - 5.3 mmol/L   Chloride 103 98 - 110 mmol/L   CO2 27 20 - 32 mmol/L   Calcium 9.1 8.6 - 10.3 mg/dL   Total Protein 6.0 (L) 6.1 - 8.1 g/dL   Albumin 4.0 3.6 - 5.1 g/dL   Globulin 2.0 1.9 - 3.7 g/dL (calc)   AG Ratio 2.0 1.0 - 2.5 (calc)   Total Bilirubin 0.6 0.2 - 1.2 mg/dL   Alkaline phosphatase (APISO) 56 35 - 144 U/L   AST 15 10 - 35 U/L   ALT 15 9 - 46 U/L  physical CBC  Result Value Ref Range   WBC 8.9 3.8 - 10.8 Thousand/uL   RBC 5.36 4.20 - 5.80 Million/uL   Hemoglobin 16.4  13.2 - 17.1 g/dL   HCT 48.8 38.5 - 50.0 %   MCV 91.0 80.0 - 100.0 fL   MCH 30.6 27.0 - 33.0 pg   MCHC 33.6 32.0 - 36.0 g/dL   RDW 13.7 11.0 - 15.0 %   Platelets 263 140 - 400 Thousand/uL   MPV 10.7 7.5 - 12.5 fL   Neutro Abs 5,393 1,500 - 7,800 cells/uL   Lymphs Abs 2,661 850 - 3,900 cells/uL   Absolute Monocytes 579 200 - 950 cells/uL   Eosinophils Absolute 231 15 - 500 cells/uL   Basophils Absolute 36 0 - 200 cells/uL   Neutrophils Relative % 60.6 %   Total Lymphocyte 29.9 %   Monocytes Relative 6.5 %   Eosinophils Relative 2.6 %   Basophils Relative 0.4 %  physical HgB A1c  Result Value Ref Range   Hgb A1c MFr Bld 5.6 <5.7 % of total Hgb   Mean Plasma Glucose 114 (calc)   eAG (mmol/L) 6.3 (calc)  physical PSA  Result Value Ref Range   PSA 6.8 (H) < OR = 4.0 ng/mL  physical Lipid panel  Result Value Ref Range   Cholesterol 117 <200 mg/dL   HDL 35 (L) > OR = 40 mg/dL   Triglycerides 308 (H) <150  mg/dL   LDL Cholesterol (Calc) 50 mg/dL (calc)   Total CHOL/HDL Ratio 3.3 <5.0 (calc)   Non-HDL Cholesterol (Calc) 82 <130 mg/dL (calc)      Assessment & Plan:   Problem List Items Addressed This Visit    Prediabetes    Improved A1c down to 5.6 now on lifestyle improvement and Metformin Concern with obesity, HTN, HLD Fam history DM  Plan:  1. Continue Metformin XR 5369m daily - future can hold or DC 2. Encourage improved lifestyle - low carb, low sugar diet, reduce portion size, continue improving regular exercise if able - On ARB      Insomnia    Likely secondary to chronic anxiety / possible PTSD, on chronic BDZ ativan with improvement, >20 years -See anxiety A&P - Refilled Lorazepam for 3 month supply      Relevant Medications   LORazepam (ATIVAN) 0.5 MG tablet   Elevated prostate specific antigen (PSA)    PSA trend 5 to 7 Now result is 6.8 Likely due to BPH but also has fam history prostate cancer  Advised we can refer back to Urology - return to [facility]  otherwise he can repeat lab in 6 months, if improving can maintain, otherwise if elevated needs to see Urologist      Coronary artery disease involving native heart    Chronic CAD Stable without worsening angina Continue ASA, BB, Statin [facility]      Centrilobular emphysema ([place]    Stable COPD without flare On Flovent Spiriva maintenance      Benign prostatic hyperplasia with urinary obstruction    Chronic BPH with some LUTS nocturia Previously followed by [facility] Has had elevated PSA 5-7 range Off tamsulosin now he declines to take and has not followed back up with Urology  Plan Elevated PSA see A&P - will repeat lab in 6 months, advised he could return to Urology sooner      Benign hypertension with CKD (chronic kidney disease) stage III    Mostly controlled HTN Home BP reviewed Complication with orthostatic hypotension, CAD, AAA    Plan:  ADD Losartan 29mdaily low dose for microalbuminuria and help BP 1. Continue current BP regimen - Amlodipine 69m63mMetoprolol XL 43m569mily, Losartan 269mg60mly 2. Encourage improved lifestyle - low sodium diet, regular exercise 3. Continue monitor BP outside office, bring readings to next visit, if persistently >140/90 or new symptoms notify office sooner      Anxiety    Stable chronic problem >20 years, on BDZ, by history seems like prior history of possible PTSD contributing, with poor sleep - No long established with Psychiatry  Plan 1. Refilled chronic Lorazepam 0.69mg 144mme nightly PRN insomnia #30, +2 refills for 3 month supply - discussed risks of chronic BDZ therapy, concerns of dependence, withdrawal, abuse potential. He is aware not to consume alcohol, has not had problem with withdrawal before.      Relevant Medications   LORazepam (ATIVAN) 0.5 MG tablet   Abdominal aortic aneurysm (AAA) (HCC)    Previously stable AAA Now due for repeat US [place]adiam 3.3cm [facility]  On HTN meds  Plan order US RetKoreaperitoneal complete for AAA surveillance, to be scheduled      Relevant Orders   [facility] US RetKoreaperitoneal Comp    Other Visit Diagnoses    Annual physical exam    -  Primary      Updated Health Maintenance information Reviewed recent  lab results with patient Encouraged improvement to lifestyle with diet and exercise - Goal of weight loss   Meds ordered this encounter  Medications  . LORazepam (ATIVAN) 0.5 MG tablet    Sig: Take 1 tablet (0.5 mg total) by mouth at bedtime as needed for anxiety.    Dispense:  30 tablet    Refill:  2     Follow up plan: Return in about 6 months (around 07/19/2019) for 6 month.   Repeat PSA in 6 months, BMET A1c  [name], [facility] 01/18/2019, 11:03 AM

## 2019-01-18 NOTE — Assessment & Plan Note (Signed)
Mostly controlled HTN Home BP reviewed Complication with orthostatic hypotension, CAD, AAA    Plan:  ADD Losartan 25mg  daily low dose for microalbuminuria and help BP 1. Continue current BP regimen - Amlodipine 5mg , Metoprolol XL 50mg  daily, Losartan 25mg  daily 2. Encourage improved lifestyle - low sodium diet, regular exercise 3. Continue monitor BP outside office, bring readings to next visit, if persistently >140/90 or new symptoms notify office sooner

## 2019-01-18 NOTE — Assessment & Plan Note (Signed)
Chronic CAD Stable without worsening angina Continue ASA, BB, Statin Follow-up with [facility] as planned

## 2019-01-21 ENCOUNTER — Telehealth: Payer: Self-pay

## 2019-01-21 NOTE — Telephone Encounter (Signed)
Signed order.

## 2019-01-21 NOTE — Telephone Encounter (Signed)
Almyra Free from the radiology department called asking if she can change the patient Charles Ewing order. She said that you are looking for possible abdominal aortic aneurysm and the Charles Ewing you requested they no longer do. She said it should be a Aorta duplex limited. She changed the order in the patients chart, but need you to sign off on the order.

## 2019-01-22 ENCOUNTER — Ambulatory Visit
Admission: RE | Admit: 2019-01-22 | Discharge: 2019-01-22 | Disposition: A | Discharge status: Home / Self Care | Payer: Medicare Other | Source: Ambulatory Visit | Attending: Family Medicine | Admitting: Family Medicine

## 2019-01-22 ENCOUNTER — Other Ambulatory Visit: Payer: Self-pay

## 2019-01-22 DIAGNOSIS — I714 Abdominal aortic aneurysm, without rupture, unspecified: Secondary | ICD-10-CM

## 2019-01-22 DIAGNOSIS — Z8679 Personal history of other diseases of the circulatory system: Secondary | ICD-10-CM | POA: Diagnosis not present

## 2019-01-22 DIAGNOSIS — Z136 Encounter for screening for cardiovascular disorders: Secondary | ICD-10-CM | POA: Diagnosis not present

## 2019-01-26 NOTE — Progress Notes (Signed)
Patient advised.

## 2019-03-04 ENCOUNTER — Other Ambulatory Visit: Payer: Self-pay | Admitting: Family Medicine

## 2019-03-04 DIAGNOSIS — R7303 Prediabetes: Secondary | ICD-10-CM

## 2019-03-05 ENCOUNTER — Telehealth: Payer: Self-pay | Admitting: Family Medicine

## 2019-03-05 NOTE — Chronic Care Management (AMB) (Signed)
  Chronic Care Management   Outreach Note  03/05/2019 Name: Charles Ewing MRN: [id number] DOB: 02/15/1940  Referred by: Olin Hauser, DO Reason for referral : Chronic Care Management (Initial CCM outreach was unsuccessful )   An unsuccessful telephone outreach was attempted today. The patient was referred to the case management team by for assistance with care management and care coordination.   Follow Up Plan: The care management team will reach out to the patient again over the next 7 days.   [place], [place] [address] Direct Dial: [email or web address]  Website: [email or web address]

## 2019-03-09 ENCOUNTER — Other Ambulatory Visit: Payer: Self-pay | Admitting: Family Medicine

## 2019-03-09 DIAGNOSIS — I10 Essential (primary) hypertension: Secondary | ICD-10-CM

## 2019-03-09 DIAGNOSIS — R809 Proteinuria, unspecified: Secondary | ICD-10-CM

## 2019-03-09 NOTE — Chronic Care Management (AMB) (Signed)
  Chronic Care Management   Outreach Note  03/09/2019 Name: BROCH PELCHER MRN: [id number] DOB: 24-May-1940  Referred by: Olin Hauser, DO Reason for referral : Chronic Care Management (Initial CCM outreach was unsuccessful ) and Chronic Care Management (second CCM outreach was unsuccessful )   A second unsuccessful telephone outreach was attempted today. The patient was referred to the case management team for assistance with care management and care coordination.   Follow Up Plan: The care management team will reach out to the patient again over the next 7 days.   [place], [place] [address] Direct Dial: [email or web address]  Website: [email or web address]

## 2019-04-08 DIAGNOSIS — B351 Tinea unguium: Secondary | ICD-10-CM | POA: Diagnosis not present

## 2019-04-08 DIAGNOSIS — L600 Ingrowing nail: Secondary | ICD-10-CM | POA: Diagnosis not present

## 2019-04-08 DIAGNOSIS — M79675 Pain in left toe(s): Secondary | ICD-10-CM | POA: Diagnosis not present

## 2019-04-08 DIAGNOSIS — M79674 Pain in right toe(s): Secondary | ICD-10-CM | POA: Diagnosis not present

## 2019-04-20 DIAGNOSIS — J449 Chronic obstructive pulmonary disease, unspecified: Secondary | ICD-10-CM | POA: Diagnosis not present

## 2019-04-20 DIAGNOSIS — I2510 Atherosclerotic heart disease of native coronary artery without angina pectoris: Secondary | ICD-10-CM | POA: Diagnosis not present

## 2019-04-20 DIAGNOSIS — E785 Hyperlipidemia, unspecified: Secondary | ICD-10-CM | POA: Diagnosis not present

## 2019-04-20 DIAGNOSIS — I10 Essential (primary) hypertension: Secondary | ICD-10-CM | POA: Diagnosis not present

## 2019-04-20 DIAGNOSIS — I714 Abdominal aortic aneurysm, without rupture: Secondary | ICD-10-CM | POA: Diagnosis not present

## 2019-05-25 ENCOUNTER — Other Ambulatory Visit: Payer: Self-pay

## 2019-05-25 ENCOUNTER — Ambulatory Visit (INDEPENDENT_AMBULATORY_CARE_PROVIDER_SITE_OTHER): Payer: Medicare Other | Admitting: Family Medicine

## 2019-05-25 ENCOUNTER — Encounter: Payer: Self-pay | Admitting: Family Medicine

## 2019-05-25 VITALS — BP 133/74 | HR 57 | Temp 97.7°F | Ht 71.0 in | Wt 213.4 lb

## 2019-05-25 DIAGNOSIS — K121 Other forms of stomatitis: Secondary | ICD-10-CM | POA: Diagnosis not present

## 2019-05-25 MED ORDER — MAGIC MOUTHWASH W/LIDOCAINE: ORAL | 0 refills | Status: DC

## 2019-05-25 NOTE — Patient Instructions (Addendum)
Thank you for coming to the office today.  OTC Peroxyl brand or other comparable - "Oral Debridement Mouthwash" - this is for ulcers and sores in mouth that can help kill bacteria and help heal. Use this twice a day for 7 to 10 days  Try Magic Mouthwash rx to help it heal as well for pain relief and symptom relief.  After 2+ weeks if unresolved or not improving then we can refer you to an ENT if need.  Please schedule a Follow-up Appointment to: Return in about 2 weeks (around 06/08/2019), or if symptoms worsen or fail to improve, for oral ulcer.  If you have any other questions or concerns, please feel free to call the office or send a message through [place]. You may also schedule an earlier appointment if necessary.  Additionally, you may be receiving a survey about your experience at our office within a few days to 1 week by e-mail or mail. We value your feedback.  [name], DO [place]

## 2019-05-25 NOTE — Progress Notes (Signed)
Subjective:    Patient ID: Charles Ewing, male    DOB: [birth year], 79 y.o.   MRN: [id number]  Charles Ewing is a 79 y.o. male presenting on 05/25/2019 for Thrush (chronic and getting worse. Pt has lesions and soreness in mouth)  Patient presents for a same day appointment.  HPI   Oral Ulcer Reports new onset oral ulcer R upper mouth near gumline where teeth go, onset 1 week ago with soreness and discomfort. Has had episodes of areas of oral thrush in past, he was using nystatin solution previously, had standing orders for it. Last episode was several months ago. He has inhalers for COPD has steroid inhaler, tries to rinse mouth out - He had dentures readjusted recently and tried not wearing them recently unsure if this caused problem in mouth - He is active smoker, 1ppd 70 years. Admits some soreness and mouth pain Denies any bleeding sore or drainage of pus or swelling, laceration, difficulty swallowing  HM UTD COVID19 vaccine Pfizer 03/19/19, and 04/09/19  Depression screen [facility] 2/9 05/25/2019 08/26/2018 04/09/2018  Decreased Interest 0 0 0  Down, Depressed, Hopeless 0 0 0  PHQ - 2 Score 0 0 0    Social History   Tobacco Use  . Smoking status: Current Every Day Smoker    Packs/day: 1.00    Years: 70.00    Pack years: 70.00    Types: Cigarettes  . Smokeless tobacco: Never Used  Substance Use Topics  . Alcohol use: No  . Drug use: No    Review of Systems Per HPI unless specifically indicated above     Objective:    BP 133/74   Pulse (!) 57   Temp 97.7 F (36.5 C) (Temporal)   Ht 5\' 11"  (1.803 m)   Wt 213 lb 6.4 oz (96.8 kg)   SpO2 98%   BMI 29.76 kg/m   Wt Readings from Last 3 Encounters:  05/25/19 213 lb 6.4 oz (96.8 kg)  01/18/19 214 lb (97.1 kg)  08/26/18 218 lb 12.8 oz (99.2 kg)    Physical Exam Vitals and nursing note reviewed.  Constitutional:      General: He is not in acute distress.    Appearance: He is well-developed. He is not diaphoretic.   Comments: Well-appearing, comfortable, cooperative  HENT:     Head: Normocephalic and atraumatic.     Mouth/Throat:     Mouth: Mucous membranes are moist.     Comments: R upper mouth cheek area with 2-3 cm area of slight ulceration and surrounding mild erythema. Eyes:     General:        Right eye: No discharge.        Left eye: No discharge.     Conjunctiva/sclera: Conjunctivae normal.  Cardiovascular:     Rate and Rhythm: Normal rate.  Pulmonary:     Effort: Pulmonary effort is normal.  Skin:    General: Skin is warm and dry.     Findings: No erythema or rash.  Neurological:     Mental Status: He is alert and oriented to person, place, and time.  Psychiatric:        Behavior: Behavior normal.     Comments: Well groomed, good eye contact, normal speech and thoughts      R upper oral ulcer, slight erythema, no dentures in place  Results for orders placed or performed in visit on 01/06/19  cmet with GFR physical  Result Value Ref Range   Glucose,  Bld 102 65 - 139 mg/dL   BUN 14 7 - 25 mg/dL   Creat 1.25 (H) 0.70 - 1.18 mg/dL   GFR, Est Non African American 55 (L) > OR = 60 mL/min/1.31m2   GFR, Est African American 64 > OR = 60 mL/min/1.2m2   BUN/Creatinine Ratio 11 6 - 22 (calc)   Sodium 139 135 - 146 mmol/L   Potassium 4.4 3.5 - 5.3 mmol/L   Chloride 103 98 - 110 mmol/L   CO2 27 20 - 32 mmol/L   Calcium 9.1 8.6 - 10.3 mg/dL   Total Protein 6.0 (L) 6.1 - 8.1 g/dL   Albumin 4.0 3.6 - 5.1 g/dL   Globulin 2.0 1.9 - 3.7 g/dL (calc)   AG Ratio 2.0 1.0 - 2.5 (calc)   Total Bilirubin 0.6 0.2 - 1.2 mg/dL   Alkaline phosphatase (APISO) 56 35 - 144 U/L   AST 15 10 - 35 U/L   ALT 15 9 - 46 U/L  physical CBC  Result Value Ref Range   WBC 8.9 3.8 - 10.8 Thousand/uL   RBC 5.36 4.20 - 5.80 Million/uL   Hemoglobin 16.4 13.2 - 17.1 g/dL   HCT 48.8 38.5 - 50.0 %   MCV 91.0 80.0 - 100.0 fL   MCH 30.6 27.0 - 33.0 pg   MCHC 33.6 32.0 - 36.0 g/dL   RDW 13.7 11.0 - 15.0 %    Platelets 263 140 - 400 Thousand/uL   MPV 10.7 7.5 - 12.5 fL   Neutro Abs 5,393 1,500 - 7,800 cells/uL   Lymphs Abs 2,661 850 - 3,900 cells/uL   Absolute Monocytes 579 200 - 950 cells/uL   Eosinophils Absolute 231 15 - 500 cells/uL   Basophils Absolute 36 0 - 200 cells/uL   Neutrophils Relative % 60.6 %   Total Lymphocyte 29.9 %   Monocytes Relative 6.5 %   Eosinophils Relative 2.6 %   Basophils Relative 0.4 %  physical HgB A1c  Result Value Ref Range   Hgb A1c MFr Bld 5.6 <5.7 % of total Hgb   Mean Plasma Glucose 114 (calc)   eAG (mmol/L) 6.3 (calc)  physical PSA  Result Value Ref Range   PSA 6.8 (H) < OR = 4.0 ng/mL  physical Lipid panel  Result Value Ref Range   Cholesterol 117 <200 mg/dL   HDL 35 (L) > OR = 40 mg/dL   Triglycerides 308 (H) <150 mg/dL   LDL Cholesterol (Calc) 50 mg/dL (calc)   Total CHOL/HDL Ratio 3.3 <5.0 (calc)   Non-HDL Cholesterol (Calc) 82 <130 mg/dL (calc)      Assessment & Plan:   Problem List Items Addressed This Visit    None    Visit Diagnoses    Oral ulcer    -  Primary   Relevant Medications   magic mouthwash w/lidocaine SOLN      Chronic history of recurrent oral thrush, 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 SWITCH to Magic Mouthwash rx - printed - will help symptom relief better Advised OTC Oral Debridement mouthwash for minor ulcer wound healing in mouth May resume dentures as advised, he may need dentist consultation on this If unresolved >2 weeks or worsening, he can contact us we can consider refer to ENT for abnormal lesion, he is worried  about history of tobacco abuse and oral lesion has him concerned. Cannot rule out malignancy - however only recent onset, seems not consistent based on symptoms  Meds ordered  this encounter  Medications  . magic mouthwash w/lidocaine SOLN    Sig: Swish, gargle, and spit one to two teaspoonfuls every six hours as needed. Shake well before using.    Dispense:  120 mL    Refill:  0    1 Part viscous lidocaine 2%  1 Part Maalox  1 Part diphenhydramine 12.5 mg per 5 ml elixir 1 Part Nystatin      Follow up plan: Return in about 2 weeks (around 06/08/2019), or if symptoms worsen or fail to improve, for oral ulcer.   [name], [facility] 05/25/2019, 2:03 PM

## 2019-05-31 ENCOUNTER — Other Ambulatory Visit: Payer: Self-pay | Admitting: Family Medicine

## 2019-06-02 ENCOUNTER — Telehealth: Payer: Self-pay | Admitting: Family Medicine

## 2019-06-02 DIAGNOSIS — K121 Other forms of stomatitis: Secondary | ICD-10-CM

## 2019-06-02 NOTE — Telephone Encounter (Signed)
Patient walked into clinic today. Last seen 1 week ago for oral ulcer problem. Has not resolved, requested referral to ENT.  Reason for Referral: R mouth ulceration, history of recurrent thrush, patient is smoker and concerned with tobacco wanted to get oral lesion checked out. Failed magic mouthwash and oral debridement wash   Has the referral been discussed with the patient?: Yes   Designated contact for the referral if not the patient (name/phone number): patient [phone] (M)   Has the patient seen a specialist for this issue before?: no  If so, who (practice/provider)?   Does the patient have a provider or location preference for the referral?: [place]  Would the patient like to see previous specialist if applicable?     [name], [facility] 06/02/2019, 12:55 PM

## 2019-06-03 ENCOUNTER — Telehealth: Payer: Self-pay | Admitting: Family Medicine

## 2019-06-03 DIAGNOSIS — K121 Other forms of stomatitis: Secondary | ICD-10-CM

## 2019-06-03 MED ORDER — MAGIC MOUTHWASH W/LIDOCAINE: ORAL | 0 refills | Status: DC

## 2019-06-03 NOTE — Telephone Encounter (Signed)
Try calling patient unable to reach and no vm set.

## 2019-06-03 NOTE — Telephone Encounter (Signed)
Phoned in refill to [place].  He can go pick up rx anytime.  [name], [facility] 06/03/2019, 4:26 PM

## 2019-06-03 NOTE — Telephone Encounter (Signed)
Patient stopped by the office wanted refill for magic mouthwash w/lidocaine SOLN-120 ml last Rx on 05/24/2019 since he has not heard anything from ENT and will run out weekend.

## 2019-06-07 ENCOUNTER — Other Ambulatory Visit: Payer: Self-pay | Admitting: Family Medicine

## 2019-06-07 DIAGNOSIS — B370 Candidal stomatitis: Secondary | ICD-10-CM

## 2019-06-07 DIAGNOSIS — Z7951 Long term (current) use of inhaled steroids: Secondary | ICD-10-CM

## 2019-06-07 NOTE — Telephone Encounter (Signed)
Requested medication (s) are due for refill today: yes  Requested medication (s) are on the active medication list: yes  Last refill:  04/19/19  Future visit scheduled: yes  Notes to clinic: no assigned protocol    Requested Prescriptions  Pending Prescriptions Disp Refills   nystatin (MYCOSTATIN) 100000 UNIT/ML suspension [Pharmacy Med Name: NYSTATIN 100000 UNIT/ML MOUTH SUSP] 473 mL 3    Sig: TAKE 5 ML (1 TEASPOONFUL) BY MOUTH 4 TIMES DAILY      Off-Protocol Failed - 06/07/2019  1:23 PM      Failed - Medication not assigned to a protocol, review manually.      Passed - Valid encounter within last 12 months    Recent Outpatient Visits           1 week ago Oral ulcer   [place], DO   4 months ago Annual physical exam   [place], DO   5 months ago Needs flu shot   [place], DO   9 months ago Keratosis, inflamed seborrheic   [facility], Devonne Doughty, DO   1 year ago Prediabetes   [place], Devonne Doughty, DO       Future Appointments             In 1 month [place], Devonne Doughty, [facility], [facility]

## 2019-06-09 DIAGNOSIS — K121 Other forms of stomatitis: Secondary | ICD-10-CM | POA: Diagnosis not present

## 2019-06-23 DIAGNOSIS — K121 Other forms of stomatitis: Secondary | ICD-10-CM | POA: Diagnosis not present

## 2019-07-13 ENCOUNTER — Other Ambulatory Visit: Payer: Medicare Other

## 2019-07-19 ENCOUNTER — Ambulatory Visit: Payer: Medicare Other | Admitting: Family Medicine

## 2019-07-27 DIAGNOSIS — F17200 Nicotine dependence, unspecified, uncomplicated: Secondary | ICD-10-CM | POA: Diagnosis not present

## 2019-07-27 DIAGNOSIS — K121 Other forms of stomatitis: Secondary | ICD-10-CM | POA: Diagnosis not present

## 2019-08-02 DIAGNOSIS — M79675 Pain in left toe(s): Secondary | ICD-10-CM | POA: Diagnosis not present

## 2019-08-02 DIAGNOSIS — B351 Tinea unguium: Secondary | ICD-10-CM | POA: Diagnosis not present

## 2019-08-02 DIAGNOSIS — M79674 Pain in right toe(s): Secondary | ICD-10-CM | POA: Diagnosis not present

## 2019-08-30 ENCOUNTER — Other Ambulatory Visit: Payer: Self-pay | Admitting: Family Medicine

## 2019-08-30 DIAGNOSIS — I10 Essential (primary) hypertension: Secondary | ICD-10-CM

## 2019-08-30 DIAGNOSIS — R809 Proteinuria, unspecified: Secondary | ICD-10-CM

## 2019-09-22 ENCOUNTER — Other Ambulatory Visit: Payer: Self-pay | Admitting: Family Medicine

## 2019-09-22 DIAGNOSIS — J432 Centrilobular emphysema: Secondary | ICD-10-CM

## 2019-09-27 ENCOUNTER — Other Ambulatory Visit: Payer: Self-pay | Admitting: Family Medicine

## 2019-09-27 DIAGNOSIS — F99 Mental disorder, not otherwise specified: Secondary | ICD-10-CM

## 2019-09-27 DIAGNOSIS — F419 Anxiety disorder, unspecified: Secondary | ICD-10-CM

## 2019-09-27 DIAGNOSIS — F5105 Insomnia due to other mental disorder: Secondary | ICD-10-CM

## 2019-09-27 NOTE — Telephone Encounter (Signed)
Requested medication (s) are due for refill today - yes  Requested medication (s) are on the active medication list -yes  Future visit scheduled -no  Last refill: 05/14/19  Notes to clinic: Request for non delegated Rx  Requested Prescriptions  Pending Prescriptions Disp Refills   LORazepam (ATIVAN) 0.5 MG tablet [Pharmacy Med Name: LORAZEPAM 0.5 MG TAB] 30 tablet     Sig: TAKE 1 TABLET BY MOUTH AT BEDTIME AS NEEDED FOR ANXIETY      Not Delegated - Psychiatry:  Anxiolytics/Hypnotics Failed - 09/27/2019  9:40 AM      Failed - This refill cannot be delegated      Failed - Urine Drug Screen completed in last 360 days.      Passed - Valid encounter within last 6 months    Recent Outpatient Visits           4 months ago Oral ulcer   [place], DO   8 months ago Annual physical exam   [facility] Olin Hauser, DO   9 months ago Needs flu shot   [facility], Devonne Doughty, DO   1 year ago Keratosis, inflamed seborrheic   [facility] Olin Hauser, DO   1 year ago Prediabetes   [place], DO                  Requested Prescriptions  Pending Prescriptions Disp Refills   LORazepam (ATIVAN) 0.5 MG tablet [Pharmacy Med Name: LORAZEPAM 0.5 MG TAB] 30 tablet     Sig: TAKE 1 TABLET BY MOUTH AT BEDTIME AS NEEDED FOR ANXIETY      Not Delegated - Psychiatry:  Anxiolytics/Hypnotics Failed - 09/27/2019  9:40 AM      Failed - This refill cannot be delegated      Failed - Urine Drug Screen completed in last 360 days.      Passed - Valid encounter within last 6 months    Recent Outpatient Visits           4 months ago Oral ulcer   [place], DO   8 months ago Annual physical exam   [facility] Olin Hauser, DO   9 months ago Needs flu shot    [place], DO   1 year ago Keratosis, inflamed seborrheic   [facility] Olin Hauser, DO   1 year ago Prediabetes   [place], Devonne Doughty, DO

## 2019-09-28 ENCOUNTER — Other Ambulatory Visit: Payer: Self-pay | Admitting: Family Medicine

## 2019-09-28 DIAGNOSIS — F419 Anxiety disorder, unspecified: Secondary | ICD-10-CM

## 2019-09-28 DIAGNOSIS — F99 Mental disorder, not otherwise specified: Secondary | ICD-10-CM

## 2019-09-28 DIAGNOSIS — F5105 Insomnia due to other mental disorder: Secondary | ICD-10-CM

## 2019-10-07 ENCOUNTER — Other Ambulatory Visit: Payer: Self-pay

## 2019-10-07 ENCOUNTER — Ambulatory Visit (INDEPENDENT_AMBULATORY_CARE_PROVIDER_SITE_OTHER): Payer: Medicare Other | Admitting: Family Medicine

## 2019-10-07 ENCOUNTER — Encounter: Payer: Self-pay | Admitting: Family Medicine

## 2019-10-07 VITALS — BP 118/59 | HR 64 | Temp 97.3°F | Resp 16 | Ht 71.0 in | Wt 219.6 lb

## 2019-10-07 DIAGNOSIS — N3000 Acute cystitis without hematuria: Secondary | ICD-10-CM | POA: Diagnosis not present

## 2019-10-07 DIAGNOSIS — R3911 Hesitancy of micturition: Secondary | ICD-10-CM | POA: Diagnosis not present

## 2019-10-07 DIAGNOSIS — L600 Ingrowing nail: Secondary | ICD-10-CM

## 2019-10-07 DIAGNOSIS — R600 Localized edema: Secondary | ICD-10-CM

## 2019-10-07 LAB — POCT URINALYSIS DIPSTICK
Bilirubin, UA: NEGATIVE
Blood, UA: NEGATIVE
Glucose, UA: NEGATIVE
Ketones, UA: NEGATIVE
Nitrite, UA: NEGATIVE
Protein, UA: POSITIVE — AB
Spec Grav, UA: 1.02 (ref 1.010–1.025)
Urobilinogen, UA: >=8 E.U./dL — AB
pH, UA: 5 (ref 5.0–8.0)

## 2019-10-07 MED ORDER — FUROSEMIDE 20 MG PO TABS: 10.0000 mg | ORAL_TABLET | Freq: Every day | ORAL | 2 refills | Status: DC

## 2019-10-07 MED ORDER — CEPHALEXIN 500 MG PO CAPS: 500.0000 mg | ORAL_CAPSULE | Freq: Three times a day (TID) | ORAL | 0 refills | Status: DC

## 2019-10-07 NOTE — Patient Instructions (Addendum)
Thank you for coming to the office today.  1. You have a Urinary Tract Infection - this is very common, your symptoms are reassuring and you should get better within 1 week on the antibiotics - Start Keflex 500mg  3 times daily for next 7 days, complete entire course, even if feeling better - We sent urine for a culture, we will call you within next few days if we need to change antibiotics - Please drink plenty of fluids, improve hydration over next 1 week  If symptoms worsening, developing nausea / vomiting, worsening back pain, fevers / chills / sweats, then please return for re-evaluation sooner.  To prevent bladder and kidney infections...  Drink enough water to keep your pee clear to pale yellow  If you think you are getting another bladder infection, start drinking cranberry juice and come see [place] so we can check the urine.  2. Use fluid pill half or whole as needed for swelling  Use RICE therapy: - R - Rest / relative rest with activity modification avoid overuse of joint - I - Ice packs (make sure you use a towel or sock / something to protect skin) - C - Compression with compression stockings or ACE wrap to apply pressure and reduce swelling allowing more support - E - Elevation - if significant swelling, lift leg above heart level (toes above your nose) to help reduce swelling, most helpful at night after day of being on your feet  Call Dr Cleda Mccreedy to look at the ingrown toenail if not improving.   Please schedule a Follow-up Appointment to: Return if symptoms worsen or fail to improve.  If you have any other questions or concerns, please feel free to call the office or send a message through [place]. You may also schedule an earlier appointment if necessary.  Additionally, you may be receiving a survey about your experience at our office within a few days to 1 week by e-mail or mail. We value your feedback.  [name], DO [place]

## 2019-10-07 NOTE — Progress Notes (Signed)
Subjective:    Patient ID: Charles Ewing, male    DOB: [birth year], 79 y.o.   MRN: [id number]  Charles Ewing is a 79 y.o. male presenting on 10/07/2019 for Foot Swelling (onset month) and urinary hesitancy (urgency --frequency--onset month)   HPI   Left Foot / Bilateral LE Edema Prior history Previously on lasix PRN 10-20mg  with good results, came off for a while now has worse swelling Worse with heat and if on feet.  Left great toenail pain / ingrown Followed by Dr Sharlotte [facility] for nail trimming He has not returned for this problem. Admits some red and pain at edge of nailbed, but no drainage  UTI / Urinary frequency hesitancy Reports increased urinary frequency for past month, he is able to mostly empty his bladder but within 5 minutes may have recurrent frequency and have to return to void Had seen Dr Diamantina [place] in 2019 Admits prior prostate procedure for BPH  Depression screen [facility] 2/9 05/25/2019 08/26/2018 04/09/2018  Decreased Interest 0 0 0  Down, Depressed, Hopeless 0 0 0  PHQ - 2 Score 0 0 0    Social History   Tobacco Use  . Smoking status: Current Every Day Smoker    Packs/day: 1.00    Years: 70.00    Pack years: 70.00    Types: Cigarettes  . Smokeless tobacco: Current User  Substance Use Topics  . Alcohol use: No  . Drug use: No    Review of Systems Per HPI unless specifically indicated above     Objective:    BP (!) 118/59   Pulse 64   Temp (!) 97.3 F (36.3 C) (Temporal)   Resp 16   Ht 5\' 11"  (1.803 m)   Wt 219 lb 9.6 oz (99.6 kg)   SpO2 96%   BMI 30.63 kg/m   Wt Readings from Last 3 Encounters:  10/07/19 219 lb 9.6 oz (99.6 kg)  05/25/19 213 lb 6.4 oz (96.8 kg)  01/18/19 214 lb (97.1 kg)    Physical Exam Vitals and nursing note reviewed.  Constitutional:      General: He is not in acute distress.    Appearance: He is well-developed. He is not diaphoretic.     Comments: Well-appearing, comfortable, cooperative  HENT:      Head: Normocephalic and atraumatic.  Eyes:     General:        Right eye: No discharge.        Left eye: No discharge.     Conjunctiva/sclera: Conjunctivae normal.  Neck:     Thyroid: No thyromegaly.  Cardiovascular:     Rate and Rhythm: Normal rate and regular rhythm.     Heart sounds: Normal heart sounds. No murmur heard.   Pulmonary:     Effort: Pulmonary effort is normal. No respiratory distress.     Breath sounds: Normal breath sounds. No wheezing or rales.  Musculoskeletal:        General: Normal range of motion.     Cervical back: Normal range of motion and neck supple.     Right lower leg: Edema (mild ankle, foot non pitting edema) present.     Left lower leg: No edema.  Lymphadenopathy:     Cervical: No cervical adenopathy.  Skin:    General: Skin is warm and dry.     Findings: No erythema or rash.  Neurological:     Mental Status: He is alert and oriented to person, place, and time.  Psychiatric:        Behavior: Behavior normal.     Comments: Well groomed, good eye contact, normal speech and thoughts       Results for orders placed or performed in visit on 10/07/19  POCT Urinalysis Dipstick  Result Value Ref Range   Color, UA dark yellow    Clarity, UA clear    Glucose, UA Negative Negative   Bilirubin, UA negative    Ketones, UA negative    Spec Grav, UA 1.020 1.010 - 1.025   Blood, UA negative    pH, UA 5.0 5.0 - 8.0   Protein, UA Positive (A) Negative   Urobilinogen, UA >=8.0 (A) 0.2 or 1.0 E.U./dL   Nitrite, UA neg    Leukocytes, UA Moderate (2+) (A) Negative   Appearance     Odor        Assessment & Plan:   Problem List Items Addressed This Visit    None    Visit Diagnoses    Acute cystitis without hematuria    -  Primary   Relevant Medications   cephALEXin (KEFLEX) 500 MG capsule   Other Relevant Orders   Urine Culture   Urinary hesitancy       Relevant Orders   POCT Urinalysis Dipstick (Completed)   Urine Culture   Bilateral lower  extremity edema       Relevant Medications   furosemide (LASIX) 20 MG tablet   Ingrown nail of great toe of left foot          #UTI, LUTS Clinically consistent with UTI and suggested on UA No recent UTIs or abx courses.  No concern for pyelo today (no systemic symptoms, neg fever, back pain, n/v).  Plan: 1. UA ordered 2. Ordered Urine culture 3. Keflex 500mg  TID x 7 days 4. Improve PO hydration 5. RTC if no improvement 1-2 weeks, red flags given to return sooner  ----------------  #Left great toenail ingrown Recent pain and swelling of L great toe bed, seems improved now, has thicker toenail Followed by Dr Cleda Mccreedy No sign of paronychia He may return to Podiatry as planned  #Bilateral LE Edema Limited to lower leg ankles/feet, seems episodic Known prior history Will trial back on Furosemide caution low dose use PRN only   Orders Placed This Encounter  Procedures  . Urine Culture  . POCT Urinalysis Dipstick     Meds ordered this encounter  Medications  . furosemide (LASIX) 20 MG tablet    Sig: Take 0.5-1 tablets (10-20 mg total) by mouth daily.    Dispense:  30 tablet    Refill:  2  . cephALEXin (KEFLEX) 500 MG capsule    Sig: Take 1 capsule (500 mg total) by mouth 3 (three) times daily. For 7 days    Dispense:  21 capsule    Refill:  0    Follow up plan: Return if symptoms worsen or fail to improve.   [name], DO [facility] 10/07/2019, 11:22 AM

## 2019-10-09 LAB — URINE CULTURE
MICRO NUMBER:: 10880277
Result:: NO GROWTH
SPECIMEN QUALITY:: ADEQUATE

## 2019-10-26 DIAGNOSIS — I714 Abdominal aortic aneurysm, without rupture: Secondary | ICD-10-CM | POA: Diagnosis not present

## 2019-10-26 DIAGNOSIS — Z23 Encounter for immunization: Secondary | ICD-10-CM | POA: Diagnosis not present

## 2019-10-26 DIAGNOSIS — I10 Essential (primary) hypertension: Secondary | ICD-10-CM | POA: Diagnosis not present

## 2019-10-26 DIAGNOSIS — J449 Chronic obstructive pulmonary disease, unspecified: Secondary | ICD-10-CM | POA: Diagnosis not present

## 2019-10-26 DIAGNOSIS — I2510 Atherosclerotic heart disease of native coronary artery without angina pectoris: Secondary | ICD-10-CM | POA: Diagnosis not present

## 2019-10-27 DIAGNOSIS — Z720 Tobacco use: Secondary | ICD-10-CM | POA: Diagnosis not present

## 2019-10-27 DIAGNOSIS — K121 Other forms of stomatitis: Secondary | ICD-10-CM | POA: Diagnosis not present

## 2019-11-29 ENCOUNTER — Other Ambulatory Visit: Payer: Self-pay | Admitting: Family Medicine

## 2019-11-29 DIAGNOSIS — M79675 Pain in left toe(s): Secondary | ICD-10-CM | POA: Diagnosis not present

## 2019-11-29 DIAGNOSIS — I10 Essential (primary) hypertension: Secondary | ICD-10-CM

## 2019-11-29 DIAGNOSIS — B351 Tinea unguium: Secondary | ICD-10-CM | POA: Diagnosis not present

## 2019-11-29 DIAGNOSIS — R809 Proteinuria, unspecified: Secondary | ICD-10-CM

## 2019-11-29 DIAGNOSIS — M79674 Pain in right toe(s): Secondary | ICD-10-CM | POA: Diagnosis not present

## 2019-12-21 ENCOUNTER — Other Ambulatory Visit: Payer: Self-pay

## 2019-12-21 ENCOUNTER — Ambulatory Visit (INDEPENDENT_AMBULATORY_CARE_PROVIDER_SITE_OTHER): Payer: Medicare Other

## 2019-12-21 DIAGNOSIS — Z23 Encounter for immunization: Secondary | ICD-10-CM

## 2020-01-21 ENCOUNTER — Encounter: Payer: Self-pay | Admitting: Family Medicine

## 2020-01-21 ENCOUNTER — Other Ambulatory Visit: Payer: Self-pay

## 2020-01-21 ENCOUNTER — Ambulatory Visit (INDEPENDENT_AMBULATORY_CARE_PROVIDER_SITE_OTHER): Payer: Medicare Other | Admitting: Family Medicine

## 2020-01-21 VITALS — BP 140/69 | HR 53 | Temp 97.3°F | Resp 16 | Ht 71.0 in | Wt 229.6 lb

## 2020-01-21 DIAGNOSIS — S0081XA Abrasion of other part of head, initial encounter: Secondary | ICD-10-CM

## 2020-01-21 DIAGNOSIS — L570 Actinic keratosis: Secondary | ICD-10-CM | POA: Diagnosis not present

## 2020-01-21 MED ORDER — MUPIROCIN CALCIUM 2 % EX CREA: 1.0000 "application " | TOPICAL_CREAM | Freq: Two times a day (BID) | CUTANEOUS | 0 refills | Status: DC

## 2020-01-21 NOTE — Progress Notes (Signed)
Subjective:    Patient ID: Charles Ewing, male    DOB: 15-Oct-1940, 79 y.o.   MRN: [id number]  Charles Ewing is a 79 y.o. male presenting on 01/21/2020 for facial spot (Hx of Skin cancer)   HPI   Actinic Keratosis, face R side Reports new problem over past several months raised dry rough tan lesion R side of cheek, without redness or drainage or ulceration.  Additionally forehead with abrasion large 3-4 cm linear, unsure if he scratched.  He has history of skin cancer in past on forehead removed at [place]. Unsure if melanoma.  He was referred to Dermatology back in 2020 but unable to go due to COVID  UTD COVID Booster    Depression screen [facility] 2/9 05/25/2019 08/26/2018 04/09/2018  Decreased Interest 0 0 0  Down, Depressed, Hopeless 0 0 0  PHQ - 2 Score 0 0 0    Social History   Tobacco Use  . Smoking status: Current Every Day Smoker    Packs/day: 1.00    Years: 70.00    Pack years: 70.00    Types: Cigarettes  . Smokeless tobacco: Current User  Substance Use Topics  . Alcohol use: No  . Drug use: No    Review of Systems Per HPI unless specifically indicated above     Objective:    BP 140/69   Pulse (!) 53   Temp (!) 97.3 F (36.3 C) (Temporal)   Resp 16   Ht 5\' 11"  (1.803 m)   Wt 229 lb 9.6 oz (104.1 kg)   SpO2 99%   BMI 32.02 kg/m   Wt Readings from Last 3 Encounters:  01/21/20 229 lb 9.6 oz (104.1 kg)  10/07/19 219 lb 9.6 oz (99.6 kg)  05/25/19 213 lb 6.4 oz (96.8 kg)    Physical Exam Vitals and nursing note reviewed.  Constitutional:      General: He is not in acute distress.    Appearance: He is well-developed and well-nourished. He is not diaphoretic.     Comments: Well-appearing, comfortable, cooperative  HENT:     Head: Normocephalic and atraumatic.     Mouth/Throat:     Mouth: Oropharynx is clear and moist.  Eyes:     General:        Right eye: No discharge.        Left eye: No discharge.     Conjunctiva/sclera: Conjunctivae normal.   Cardiovascular:     Rate and Rhythm: Normal rate.  Pulmonary:     Effort: Pulmonary effort is normal.  Musculoskeletal:        General: No edema.  Skin:    General: Skin is warm and dry.     Findings: Lesion (Raised 1 to 1.5 cm rough scaly tan lesion R cheek. also has linear 3-4 cm abrasion forehead) present. No erythema or rash.  Neurological:     Mental Status: He is alert and oriented to person, place, and time.  Psychiatric:        Mood and Affect: Mood and affect normal.        Behavior: Behavior normal.     Comments: Well groomed, good eye contact, normal speech and thoughts       Results for orders placed or performed in visit on 10/07/19  Urine Culture   Specimen: Urine  Result Value Ref Range   MICRO NUMBER: [id number]    SPECIMEN QUALITY: Adequate    Sample Source URINE, CLEAN CATCH    STATUS: FINAL  Result: No Growth   POCT Urinalysis Dipstick  Result Value Ref Range   Color, UA dark yellow    Clarity, UA clear    Glucose, UA Negative Negative   Bilirubin, UA negative    Ketones, UA negative    Spec Grav, UA 1.020 1.010 - 1.025   Blood, UA negative    pH, UA 5.0 5.0 - 8.0   Protein, UA Positive (A) Negative   Urobilinogen, UA >=8.0 (A) 0.2 or 1.0 E.U./dL   Nitrite, UA neg    Leukocytes, UA Moderate (2+) (A) Negative   Appearance     Odor        Assessment & Plan:   Problem List Items Addressed This Visit   None   Visit Diagnoses    AK (actinic keratosis)    -  Primary   Relevant Orders   Ambulatory referral to Dermatology   Abrasion of face, initial encounter       Relevant Medications   mupirocin cream (BACTROBAN) 2 %     #Abrasion forehead Unsure cause of abrasion, has some localized edema Trial on mupirocin topical   #AKs multiple History of skin cancer, s/p surgery at [facility], not reported to be melanoma Referral to Dermatology for management of R cheek AK and other skin lesions identified on exam.  Meds ordered this encounter   Medications  . mupirocin cream (BACTROBAN) 2 %    Sig: Apply 1 application topically 2 (two) times daily. For 1-2 weeks, as needed    Dispense:  15 g    Refill:  0      Follow up plan: Return if symptoms worsen or fail to improve.    [name], [facility] 01/21/2020, 9:53 AM

## 2020-01-21 NOTE — Patient Instructions (Addendum)
Thank you for coming to the office today.  Charles   Ewing, [place] [address] Hours: 8AM-5PM Phone: [phone]  Stay tuned for apt  Use antibiotic cream on forehead abrasion.  Please schedule a Follow-up Appointment to: Return if symptoms worsen or fail to improve.  If you have any other questions or concerns, please feel free to call the office or send a message through [place]. You may also schedule an earlier appointment if necessary.  Additionally, you may be receiving a survey about your experience at our office within a few days to 1 week by e-mail or mail. We value your feedback.  [name], DO [place]

## 2020-01-26 DIAGNOSIS — H2513 Age-related nuclear cataract, bilateral: Secondary | ICD-10-CM | POA: Diagnosis not present

## 2020-01-26 LAB — HM DIABETES EYE EXAM

## 2020-02-08 DIAGNOSIS — K121 Other forms of stomatitis: Secondary | ICD-10-CM | POA: Diagnosis not present

## 2020-02-08 DIAGNOSIS — Z720 Tobacco use: Secondary | ICD-10-CM | POA: Diagnosis not present

## 2020-02-24 ENCOUNTER — Other Ambulatory Visit: Payer: Self-pay | Admitting: Family Medicine

## 2020-02-24 DIAGNOSIS — R600 Localized edema: Secondary | ICD-10-CM

## 2020-02-24 NOTE — Telephone Encounter (Signed)
Requested medication (s) are due for refill today:   Yes  Requested medication (s) are on the active medication list:   Yes  Future visit scheduled:   No   Last ordered: 10/07/2019 #30, 2 refills  Returned because labs due.   Requested Prescriptions  Pending Prescriptions Disp Refills   furosemide (LASIX) 20 MG tablet [Pharmacy Med Name: FUROSEMIDE 20 MG TAB] 30 tablet 2    Sig: TAKE ONE-HALF TO ONE TABLET BY MOUTH DAILY      Cardiovascular:  Diuretics - Loop Failed - 02/24/2020  3:01 PM      Failed - K in normal range and within 360 days    Potassium  Date Value Ref Range Status  01/11/2019 4.4 3.5 - 5.3 mmol/L Final  10/16/2013 3.8 3.5 - 5.1 mmol/L Final          Failed - Ca in normal range and within 360 days    Calcium  Date Value Ref Range Status  01/11/2019 9.1 8.6 - 10.3 mg/dL Final   Calcium, Total  Date Value Ref Range Status  10/16/2013 8.0 (L) 8.5 - 10.1 mg/dL Final          Failed - Na in normal range and within 360 days    Sodium  Date Value Ref Range Status  01/11/2019 139 135 - 146 mmol/L Final  05/19/2015 138 134 - 144 mmol/L Final  10/16/2013 141 136 - 145 mmol/L Final          Failed - Cr in normal range and within 360 days    Creat  Date Value Ref Range Status  01/11/2019 1.25 (H) 0.70 - 1.18 mg/dL Final    Comment:    For patients >68 years of age, the reference limit for Creatinine is approximately 13% higher for people identified as African-American. .           Failed - Last BP in normal range    BP Readings from Last 1 Encounters:  01/21/20 140/69          Passed - Valid encounter within last 6 months    Recent Outpatient Visits           1 month ago AK (actinic keratosis)   [place], DO   4 months ago Acute cystitis without hematuria   [place], DO   9 months ago Oral ulcer   [place], DO    1 year ago Annual physical exam   [facility] Olin Hauser, DO   1 year ago Needs flu shot   [place], DO       Future Appointments             In 4 months Ralene Bathe, MD [place]

## 2020-03-06 ENCOUNTER — Other Ambulatory Visit: Payer: Self-pay | Admitting: Family Medicine

## 2020-03-06 DIAGNOSIS — R7303 Prediabetes: Secondary | ICD-10-CM

## 2020-03-06 DIAGNOSIS — R809 Proteinuria, unspecified: Secondary | ICD-10-CM

## 2020-03-06 DIAGNOSIS — I10 Essential (primary) hypertension: Secondary | ICD-10-CM

## 2020-03-07 ENCOUNTER — Other Ambulatory Visit: Payer: Self-pay | Admitting: Family Medicine

## 2020-03-07 DIAGNOSIS — I10 Essential (primary) hypertension: Secondary | ICD-10-CM

## 2020-03-07 DIAGNOSIS — R809 Proteinuria, unspecified: Secondary | ICD-10-CM

## 2020-03-07 DIAGNOSIS — R7303 Prediabetes: Secondary | ICD-10-CM

## 2020-03-23 DIAGNOSIS — I10 Essential (primary) hypertension: Secondary | ICD-10-CM | POA: Diagnosis not present

## 2020-03-23 DIAGNOSIS — E785 Hyperlipidemia, unspecified: Secondary | ICD-10-CM | POA: Diagnosis not present

## 2020-03-23 DIAGNOSIS — I2510 Atherosclerotic heart disease of native coronary artery without angina pectoris: Secondary | ICD-10-CM | POA: Diagnosis not present

## 2020-03-23 DIAGNOSIS — J449 Chronic obstructive pulmonary disease, unspecified: Secondary | ICD-10-CM | POA: Diagnosis not present

## 2020-03-23 DIAGNOSIS — R0602 Shortness of breath: Secondary | ICD-10-CM | POA: Diagnosis not present

## 2020-03-23 DIAGNOSIS — I714 Abdominal aortic aneurysm, without rupture: Secondary | ICD-10-CM | POA: Diagnosis not present

## 2020-03-23 DIAGNOSIS — R600 Localized edema: Secondary | ICD-10-CM | POA: Diagnosis not present

## 2020-04-05 DIAGNOSIS — H2512 Age-related nuclear cataract, left eye: Secondary | ICD-10-CM | POA: Diagnosis not present

## 2020-04-14 ENCOUNTER — Ambulatory Visit (INDEPENDENT_AMBULATORY_CARE_PROVIDER_SITE_OTHER): Payer: Medicare Other | Admitting: Family Medicine

## 2020-04-14 ENCOUNTER — Other Ambulatory Visit: Payer: Self-pay

## 2020-04-14 ENCOUNTER — Other Ambulatory Visit: Payer: Self-pay | Admitting: Family Medicine

## 2020-04-14 ENCOUNTER — Encounter: Payer: Self-pay | Admitting: Family Medicine

## 2020-04-14 VITALS — BP 155/73 | HR 54 | Ht 71.0 in | Wt 225.8 lb

## 2020-04-14 DIAGNOSIS — N138 Other obstructive and reflux uropathy: Secondary | ICD-10-CM | POA: Diagnosis not present

## 2020-04-14 DIAGNOSIS — K219 Gastro-esophageal reflux disease without esophagitis: Secondary | ICD-10-CM | POA: Diagnosis not present

## 2020-04-14 DIAGNOSIS — N401 Enlarged prostate with lower urinary tract symptoms: Secondary | ICD-10-CM | POA: Diagnosis not present

## 2020-04-14 DIAGNOSIS — S29011A Strain of muscle and tendon of front wall of thorax, initial encounter: Secondary | ICD-10-CM | POA: Diagnosis not present

## 2020-04-14 DIAGNOSIS — R3911 Hesitancy of micturition: Secondary | ICD-10-CM | POA: Diagnosis not present

## 2020-04-14 DIAGNOSIS — R350 Frequency of micturition: Secondary | ICD-10-CM | POA: Diagnosis not present

## 2020-04-14 MED ORDER — BACLOFEN 10 MG PO TABS: 5.0000 mg | ORAL_TABLET | Freq: Three times a day (TID) | ORAL | 1 refills | Status: DC | PRN

## 2020-04-14 MED ORDER — SUCRALFATE 1 G PO TABS: 1.0000 g | ORAL_TABLET | Freq: Three times a day (TID) | ORAL | 5 refills | Status: DC

## 2020-04-14 MED ORDER — TAMSULOSIN HCL 0.4 MG PO CAPS: 0.4000 mg | ORAL_CAPSULE | Freq: Every day | ORAL | 3 refills | Status: DC

## 2020-04-14 NOTE — Progress Notes (Signed)
Subjective:    Patient ID: Charles Ewing, male    DOB: 09/15/1940, 80 y.o.   MRN: [id number]  Charles Ewing is a 80 y.o. male presenting on 04/14/2020 for Back Pain (Left side)   HPI   LEFT RIB / BACK PAIN, Left mid side - Reports symptoms started about 1 week ago without inciting injury no fall or injury or lifting/twisting. Today seems to be gradual improvement, now not as much pain, more soreness.  - Describes pain as soreness, without worsening. Not provoked by movement or breathing. But can pinpoint the pain and make it worse by pressing on it. No pain radiating to legs. - Taking Meloxicam regularly for knees, unsure if helping. History of OA/DJD - Denies any fevers/chills, numbness, tingling, weakness, loss of control bladder/bowel incontinence or retention, unintentional wt loss, night sweats  UTI / Urinary frequency hesitancy - Last visit with me 09/2019, for initial visit for same problem UTI, treated with Keflex after urinalysis but culture was No Growth, see prior notes for background information. - Interval update with symptoms did improve after antibiotic at that time. - Today patient reports symptoms now worse for 3-4 months bothering him, going to urinate every few minutes, small amount of urination. history of BPH, s/p TURP Dr Bernardo Heater 2010, has had hematuria in past, also UTI treated, and negative prostate biopsy x 3, had seen Dr Sninsky 2019 Admits worse nocturia He came off tamsulosin 0.4mg , uncertain why, he is interested to restart it   Depression screen [facility] 2/9 05/25/2019 08/26/2018 04/09/2018  Decreased Interest 0 0 0  Down, Depressed, Hopeless 0 0 0  PHQ - 2 Score 0 0 0    Social History   Tobacco Use  . Smoking status: Current Every Day Smoker    Packs/day: 1.00    Years: 70.00    Pack years: 70.00    Types: Cigarettes  . Smokeless tobacco: Current User  Substance Use Topics  . Alcohol use: No  . Drug use: No    Review of Systems Per HPI unless  specifically indicated above     Objective:    BP (!) 155/73   Pulse (!) 54   Ht 5\' 11"  (1.803 m)   Wt 225 lb 12.8 oz (102.4 kg)   SpO2 98%   BMI 31.49 kg/m   Wt Readings from Last 3 Encounters:  04/14/20 225 lb 12.8 oz (102.4 kg)  01/21/20 229 lb 9.6 oz (104.1 kg)  10/07/19 219 lb 9.6 oz (99.6 kg)    Physical Exam Vitals and nursing note reviewed.  Constitutional:      General: He is not in acute distress.    Appearance: He is well-developed and well-nourished. He is not diaphoretic.     Comments: Well-appearing, comfortable, cooperative  HENT:     Head: Normocephalic and atraumatic.     Mouth/Throat:     Mouth: Oropharynx is clear and moist.  Eyes:     General:        Right eye: No discharge.        Left eye: No discharge.     Conjunctiva/sclera: Conjunctivae normal.  Cardiovascular:     Rate and Rhythm: Normal rate.  Pulmonary:     Effort: Pulmonary effort is normal.  Musculoskeletal:        General: No edema.  Skin:    General: Skin is warm and dry.     Findings: No erythema or rash.  Neurological:     Mental Status: He  is alert and oriented to person, place, and time.  Psychiatric:        Mood and Affect: Mood and affect normal.        Behavior: Behavior normal.     Comments: Well groomed, good eye contact, normal speech and thoughts    Results for orders placed or performed in visit on 01/28/20  HM DIABETES EYE EXAM  Result Value Ref Range   HM Diabetic Eye Exam No Retinopathy No Retinopathy      Assessment & Plan:   Problem List Items Addressed This Visit    Benign prostatic hyperplasia with urinary obstruction   Relevant Medications   tamsulosin (FLOMAX) 0.4 MG CAPS capsule    Other Visit Diagnoses    Intercostal muscle strain, initial encounter    -  Primary   Relevant Medications   baclofen (LIORESAL) 10 MG tablet   Urinary hesitancy       Relevant Medications   tamsulosin (FLOMAX) 0.4 MG CAPS capsule   Urinary frequency       Relevant  Medications   tamsulosin (FLOMAX) 0.4 MG CAPS capsule   Gastroesophageal reflux disease without esophagitis       Relevant Medications   sucralfate (CARAFATE) 1 g tablet      #Left Rib flank pain MSK etiology, reproducible on exam, recent onset. Likely strain Trial on Baclofen 5-10mg  PRN caution sedation Use voltaren topical PRN  #BPH LUTS S/p TURP Compelx prostate history in past with prior procedure, surgical management and then on alpha blocker, mixed results. Now off med, will restart Tamsulosin 0.4mg  daily, he stopped it previously unsure why. Trial on med, if not successful in providing relief of LUTS and nocturia then we can refer back to [facility] He will f/u with [place]  Meds ordered this encounter  Medications  . baclofen (LIORESAL) 10 MG tablet    Sig: Take 0.5-1 tablets (5-10 mg total) by mouth 3 (three) times daily as needed for muscle spasms.    Dispense:  30 each    Refill:  1  . tamsulosin (FLOMAX) 0.4 MG CAPS capsule    Sig: Take 1 capsule (0.4 mg total) by mouth daily after breakfast.    Dispense:  90 capsule    Refill:  3  . sucralfate (CARAFATE) 1 g tablet    Sig: Take 1 tablet (1 g total) by mouth 4 (four) times daily -  with meals and at bedtime. As needed    Dispense:  40 tablet    Refill:  5      Follow up plan: Return in about 6 months (around 10/15/2020) for 6 month fasting lab only then 1 week later Annual Physical.   [name], DO [facility] 04/14/2020, 10:50 AM

## 2020-04-14 NOTE — Patient Instructions (Addendum)
Thank you for coming to the office today.  Start taking Baclofen (Lioresal) 10mg  (muscle relaxant) - start with half (cut) to one whole pill at night as needed for next 1-3 nights (may make you drowsy, caution with driving) see how it affects you, then if tolerated increase to one pill 2 to 3 times a day or (every 8 hours as needed)  Try Voltaren topical cream for the rib / flank pain as well. Up to 4 times a day  ----------------------  DUE for FASTING BLOOD WORK (no food or drink after midnight before the lab appointment, only water or coffee without cream/sugar on the morning of)  SCHEDULE "Lab Only" visit in the morning at the clinic for lab draw in 6 MONTHS   - Make sure Lab Only appointment is at about 1 week before your next appointment, so that results will be available  For Lab Results, once available within 2-3 days of blood draw, you can can log in to MyChart online to view your results and a brief explanation. Also, we can discuss results at next follow-up visit.   Please schedule a Follow-up Appointment to: Return in about 6 months (around 10/15/2020) for 6 month fasting lab only then 1 week later Annual Physical.  If you have any other questions or concerns, please feel free to call the office or send a message through [place]. You may also schedule an earlier appointment if necessary.  Additionally, you may be receiving a survey about your experience at our office within a few days to 1 week by e-mail or mail. We value your feedback.  Charles Putnam, DO [place]

## 2020-04-17 DIAGNOSIS — B351 Tinea unguium: Secondary | ICD-10-CM | POA: Diagnosis not present

## 2020-04-17 DIAGNOSIS — M79674 Pain in right toe(s): Secondary | ICD-10-CM | POA: Diagnosis not present

## 2020-04-17 DIAGNOSIS — M79675 Pain in left toe(s): Secondary | ICD-10-CM | POA: Diagnosis not present

## 2020-05-01 DIAGNOSIS — H2512 Age-related nuclear cataract, left eye: Secondary | ICD-10-CM | POA: Diagnosis not present

## 2020-05-02 DIAGNOSIS — K121 Other forms of stomatitis: Secondary | ICD-10-CM | POA: Diagnosis not present

## 2020-05-02 DIAGNOSIS — Z720 Tobacco use: Secondary | ICD-10-CM | POA: Diagnosis not present

## 2020-05-02 DIAGNOSIS — H60339 Swimmer's ear, unspecified ear: Secondary | ICD-10-CM | POA: Diagnosis not present

## 2020-05-05 ENCOUNTER — Other Ambulatory Visit
Admission: RE | Admit: 2020-05-05 | Discharge: 2020-05-05 | Disposition: A | Discharge status: Home / Self Care | Payer: Medicare Other | Source: Ambulatory Visit | Attending: Ophthalmology | Admitting: Ophthalmology

## 2020-05-05 ENCOUNTER — Other Ambulatory Visit: Payer: Self-pay

## 2020-05-05 DIAGNOSIS — Z20822 Contact with and (suspected) exposure to covid-19: Secondary | ICD-10-CM | POA: Diagnosis not present

## 2020-05-05 DIAGNOSIS — Z01812 Encounter for preprocedural laboratory examination: Secondary | ICD-10-CM | POA: Insufficient documentation

## 2020-05-05 LAB — SARS CORONAVIRUS 2 (TAT 6-24 HRS): SARS Coronavirus 2: NEGATIVE

## 2020-05-08 ENCOUNTER — Encounter: Payer: Self-pay | Admitting: Ophthalmology

## 2020-05-09 ENCOUNTER — Encounter: Payer: Self-pay | Admitting: Ophthalmology

## 2020-05-09 ENCOUNTER — Ambulatory Visit
Admission: RE | Admit: 2020-05-09 | Discharge: 2020-05-09 | Disposition: A | Discharge status: Home / Self Care | Payer: Medicare Other | Attending: Ophthalmology | Admitting: Ophthalmology

## 2020-05-09 ENCOUNTER — Ambulatory Visit: Payer: Medicare Other | Admitting: Anesthesiology

## 2020-05-09 ENCOUNTER — Other Ambulatory Visit: Payer: Self-pay

## 2020-05-09 ENCOUNTER — Encounter
Admission: RE | Disposition: A | Discharge status: Home / Self Care | Payer: Self-pay | Source: Home / Self Care | Attending: Ophthalmology

## 2020-05-09 DIAGNOSIS — Z791 Long term (current) use of non-steroidal anti-inflammatories (NSAID): Secondary | ICD-10-CM | POA: Insufficient documentation

## 2020-05-09 DIAGNOSIS — F17210 Nicotine dependence, cigarettes, uncomplicated: Secondary | ICD-10-CM | POA: Insufficient documentation

## 2020-05-09 DIAGNOSIS — Z8051 Family history of malignant neoplasm of kidney: Secondary | ICD-10-CM | POA: Diagnosis not present

## 2020-05-09 DIAGNOSIS — I2510 Atherosclerotic heart disease of native coronary artery without angina pectoris: Secondary | ICD-10-CM | POA: Diagnosis not present

## 2020-05-09 DIAGNOSIS — Z7982 Long term (current) use of aspirin: Secondary | ICD-10-CM | POA: Diagnosis not present

## 2020-05-09 DIAGNOSIS — I10 Essential (primary) hypertension: Secondary | ICD-10-CM | POA: Diagnosis not present

## 2020-05-09 DIAGNOSIS — Z7951 Long term (current) use of inhaled steroids: Secondary | ICD-10-CM | POA: Diagnosis not present

## 2020-05-09 DIAGNOSIS — H2512 Age-related nuclear cataract, left eye: Secondary | ICD-10-CM | POA: Diagnosis not present

## 2020-05-09 DIAGNOSIS — Z79899 Other long term (current) drug therapy: Secondary | ICD-10-CM | POA: Diagnosis not present

## 2020-05-09 DIAGNOSIS — E785 Hyperlipidemia, unspecified: Secondary | ICD-10-CM | POA: Diagnosis not present

## 2020-05-09 DIAGNOSIS — Z85828 Personal history of other malignant neoplasm of skin: Secondary | ICD-10-CM | POA: Diagnosis not present

## 2020-05-09 DIAGNOSIS — H25812 Combined forms of age-related cataract, left eye: Secondary | ICD-10-CM | POA: Diagnosis not present

## 2020-05-09 DIAGNOSIS — I714 Abdominal aortic aneurysm, without rupture: Secondary | ICD-10-CM | POA: Insufficient documentation

## 2020-05-09 DIAGNOSIS — Z7984 Long term (current) use of oral hypoglycemic drugs: Secondary | ICD-10-CM | POA: Insufficient documentation

## 2020-05-09 HISTORY — PX: CATARACT EXTRACTION W/PHACO: SHX586

## 2020-05-09 LAB — GLUCOSE, CAPILLARY
Glucose-Capillary: 114 mg/dL — ABNORMAL HIGH (ref 70–99)
Glucose-Capillary: 119 mg/dL — ABNORMAL HIGH (ref 70–99)

## 2020-05-09 SURGERY — PHACOEMULSIFICATION, CATARACT, WITH IOL INSERTION
Anesthesia: Monitor Anesthesia Care | Site: Eye | Laterality: Left

## 2020-05-09 MED ORDER — MIDAZOLAM HCL 2 MG/2ML IJ SOLN
INTRAMUSCULAR | Status: DC | PRN
  Administered 2020-05-09: 1 mg via INTRAVENOUS

## 2020-05-09 MED ORDER — LIDOCAINE HCL (PF) 2 % IJ SOLN
INTRAOCULAR | Status: DC | PRN
  Administered 2020-05-09: 2 mL

## 2020-05-09 MED ORDER — MOXIFLOXACIN HCL 0.5 % OP SOLN
OPHTHALMIC | Status: DC | PRN
  Administered 2020-05-09: 0.2 mL via OPHTHALMIC

## 2020-05-09 MED ORDER — ARMC OPHTHALMIC DILATING DROPS
1.0000 "application " | OPHTHALMIC | Status: DC | PRN
  Administered 2020-05-09 (×3): 1 via OPHTHALMIC

## 2020-05-09 MED ORDER — EPINEPHRINE PF 1 MG/ML IJ SOLN: INTRAOCULAR | Status: DC | PRN

## 2020-05-09 MED ORDER — FENTANYL CITRATE (PF) 100 MCG/2ML IJ SOLN
INTRAMUSCULAR | Status: DC | PRN
  Administered 2020-05-09: 50 ug via INTRAVENOUS

## 2020-05-09 MED ORDER — BRIMONIDINE TARTRATE-TIMOLOL 0.2-0.5 % OP SOLN
OPHTHALMIC | Status: DC | PRN
  Administered 2020-05-09: 1 [drp] via OPHTHALMIC

## 2020-05-09 MED ORDER — NA CHONDROIT SULF-NA HYALURON 40-17 MG/ML IO SOLN
INTRAOCULAR | Status: DC | PRN
  Administered 2020-05-09: 1 mL via INTRAOCULAR

## 2020-05-09 MED ORDER — TETRACAINE HCL 0.5 % OP SOLN
1.0000 [drp] | OPHTHALMIC | Status: DC | PRN
  Administered 2020-05-09 (×3): 1 [drp] via OPHTHALMIC

## 2020-05-09 SURGICAL SUPPLY — 15 items
CANNULA ANT/CHMB 27GA (MISCELLANEOUS) ×4 IMPLANT
GLOVE SURG TRIUMPH 8.0 PF LTX (GLOVE) ×4 IMPLANT
GOWN STRL REUS W/ TWL LRG LVL3 (GOWN DISPOSABLE) ×2 IMPLANT
GOWN STRL REUS W/TWL LRG LVL3 (GOWN DISPOSABLE) ×4
LENS IOL TECNIS EYHANCE 20.0 (Intraocular Lens) ×2 IMPLANT
MARKER SKIN DUAL TIP RULER LAB (MISCELLANEOUS) ×2 IMPLANT
NEEDLE FILTER BLUNT 18X 1/2SAF (NEEDLE) ×1
NEEDLE FILTER BLUNT 18X1 1/2 (NEEDLE) ×1 IMPLANT
PACK EYE AFTER SURG (MISCELLANEOUS) ×2 IMPLANT
PACK OPTHALMIC (MISCELLANEOUS) ×2 IMPLANT
PACK PORFILIO (MISCELLANEOUS) ×2 IMPLANT
SYR 3ML LL SCALE MARK (SYRINGE) ×2 IMPLANT
SYR TB 1ML LUER SLIP (SYRINGE) ×2 IMPLANT
WATER STERILE IRR 250ML POUR (IV SOLUTION) ×2 IMPLANT
WIPE NON LINTING 3.25X3.25 (MISCELLANEOUS) ×2 IMPLANT

## 2020-05-09 NOTE — Op Note (Signed)
PREOPERATIVE DIAGNOSIS:  Nuclear sclerotic cataract of the left eye.   POSTOPERATIVE DIAGNOSIS:  Nuclear sclerotic cataract of the left eye.   OPERATIVE PROCEDURE:@   SURGEON:  Birder Robson, MD.   ANESTHESIA:  Anesthesiologist: Veda Canning, MD CRNA: Mayme Genta, CRNA  1.      Managed anesthesia care. 2.     0.61ml of Shugarcaine was instilled following the paracentesis   COMPLICATIONS:  None.   TECHNIQUE:   Stop and chop   DESCRIPTION OF PROCEDURE:  The patient was examined and consented in the preoperative holding area where the aforementioned topical anesthesia was applied to the left eye and then brought back to the Operating Room where the left eye was prepped and draped in the usual sterile ophthalmic fashion and a lid speculum was placed. A paracentesis was created with the side port blade and the anterior chamber was filled with viscoelastic. A near clear corneal incision was performed with the steel keratome. A continuous curvilinear capsulorrhexis was performed with a cystotome followed by the capsulorrhexis forceps. Hydrodissection and hydrodelineation were carried out with BSS on a blunt cannula. The lens was removed in a stop and chop  technique and the remaining cortical material was removed with the irrigation-aspiration handpiece. The capsular bag was inflated with viscoelastic and the Technis [id number] lens was placed in the capsular bag without complication. The remaining viscoelastic was removed from the eye with the irrigation-aspiration handpiece. The wounds were hydrated. The anterior chamber was flushed with BSS and the eye was inflated to physiologic pressure. 0.64ml Vigamox was placed in the anterior chamber. The wounds were found to be water tight. The eye was dressed with Combigan. The patient was given protective glasses to wear throughout the day and a shield with which to sleep tonight. The patient was also given drops with which to begin a drop regimen today and  will follow-up with me in one day. Implant Name Type Inv. Item Serial No. Manufacturer Lot No. LRB No. Used Action  LENS IOL TECNIS EYHANCE 20.0 - [id number] Intraocular Lens LENS IOL TECNIS EYHANCE 20.0 [id number] JOHNSON   Left 1 Implanted    Procedure(s): CATARACT EXTRACTION PHACO AND INTRAOCULAR LENS PLACEMENT (IOC) LEFT DIABETIC 9.09 01:02.0 (Left)  Electronically signed: Birder Robson 05/09/2020 2:19 PM

## 2020-05-09 NOTE — Anesthesia Postprocedure Evaluation (Signed)
Anesthesia Post Note  Patient: Charles Ewing  Procedure(s) Performed: CATARACT EXTRACTION PHACO AND INTRAOCULAR LENS PLACEMENT (IOC) LEFT DIABETIC 9.09 01:02.0 (Left Eye)     Patient location during evaluation: PACU Anesthesia Type: MAC Level of consciousness: awake Pain management: pain level controlled Vital Signs Assessment: post-procedure vital signs reviewed and stable Respiratory status: respiratory function stable Cardiovascular status: stable Postop Assessment: no apparent nausea or vomiting Anesthetic complications: no   No complications documented.  Veda Canning

## 2020-05-09 NOTE — Anesthesia Procedure Notes (Signed)
Performed by: Daliah Chaudoin, CRNA Pre-anesthesia Checklist: Patient identified, Emergency Drugs available, Suction available, Timeout performed and Patient being monitored Patient Re-evaluated:Patient Re-evaluated prior to induction Oxygen Delivery Method: Nasal cannula Placement Confirmation: positive ETCO2       

## 2020-05-09 NOTE — Discharge Instructions (Signed)

## 2020-05-09 NOTE — H&P (Signed)
[facility]   Primary Care Physician:  Olin Hauser, DO Ophthalmologist: Dr. George Ina  Pre-Procedure History & Physical: HPI:  IBRAHAM Ewing is a 80 y.o. male here for cataract surgery.   Past Medical History:  Diagnosis Date  . Abdominal aortic aneurysm (AAA) ([place]) 2013   found at [facility]-  . Anxiety   . Arrhythmia   . Arthritis   . Colon polyp   . COPD (chronic obstructive pulmonary disease) ([place])   . Coronary artery disease   . Depression   . GERD (gastroesophageal reflux disease)   . Hyperlipidemia   . Hypertension   . Skin cancer   . Thrush     Past Surgical History:  Procedure Laterality Date  . Colon polyp surgery    . SKIN CANCER EXCISION    . TRANSURETHRAL RESECTION OF PROSTATE  2010    Prior to Admission medications   Medication Sig Start Date End Date Taking? Authorizing Provider  albuterol (VENTOLIN HFA) 108 (90 Base) MCG/ACT inhaler INHALE 2 PUFFS INTO THE LUNGS EVERY 6 HOURS AS NEEDED FOR WHEEZING OR SHORTNESS OF BREATH. 09/23/19  Yes Karamalegos, Devonne Doughty, DO  amLODipine (NORVASC) 5 MG tablet  05/16/17  Yes [provider]  aspirin EC 81 MG tablet Take 81 mg by mouth every evening.   Yes [provider]  baclofen (LIORESAL) 10 MG tablet Take 0.5-1 tablets (5-10 mg total) by mouth 3 (three) times daily as needed for muscle spasms. 04/14/20  Yes Karamalegos, Devonne Doughty, DO  clotrimazole (MYCELEX) 10 MG troche  10/27/19  Yes [provider]  Elastic Bandages & Supports ([place]) [place] 1 each by Does not apply route daily. 06/12/15  Yes Krebs, Amy Lauren, NP  esomeprazole (NEXIUM) 40 MG capsule esomeprazole magnesium 40 mg capsule,delayed release   Yes [provider]  fluticasone (FLONASE) 50 MCG/ACT nasal spray PLACE 2 SPRAYS INTO BOTH NOSTRILS DAILY 05/31/19  Yes Karamalegos, Alexander J, DO  furosemide (LASIX) 20 MG tablet TAKE ONE-HALF TO ONE TABLET BY MOUTH DAILY 02/24/20  Yes Karamalegos,  Devonne Doughty, DO  hydrochlorothiazide (HYDRODIURIL) 25 MG tablet  07/09/18  Yes [provider]  loratadine (CLARITIN) 10 MG tablet Take 10 mg by mouth every evening.   Yes [provider]  LORazepam (ATIVAN) 0.5 MG tablet TAKE 1 TABLET BY MOUTH AT BEDTIME AS NEEDED FOR ANXIETY 09/27/19  Yes Karamalegos, Devonne Doughty, DO  losartan (COZAAR) 25 MG tablet TAKE 1 TABLET BY MOUTH DAILY 03/07/20  Yes Karamalegos, Devonne Doughty, DO  meloxicam (MOBIC) 15 MG tablet meloxicam 15 mg tablet  TAKE 1 TABLET BY MOUTH DAILY WITH MEALS   Yes [provider]  metFORMIN (GLUCOPHAGE-XR) 500 MG 24 hr tablet TAKE 1 TABLET BY MOUTH DAILY WITH BREAKFAST 03/07/20  Yes Karamalegos, Devonne Doughty, DO  metoprolol succinate (TOPROL-XL) 50 MG 24 hr tablet Take 1 tablet (50 mg total) by mouth daily. Take with or immediately following a meal. 11/17/15  Yes Krebs, Amy Lauren, NP  pantoprazole (PROTONIX) 20 MG tablet Take 20 mg by mouth daily. 01/10/20  Yes [provider]  simvastatin (ZOCOR) 20 MG tablet Take 20 mg by mouth at bedtime.   Yes [provider]  sucralfate (CARAFATE) 1 g tablet Take 1 tablet (1 g total) by mouth 4 (four) times daily -  with meals and at bedtime. As needed 04/14/20  Yes Karamalegos, Devonne Doughty, DO  tamsulosin (FLOMAX) 0.4 MG CAPS capsule Take 1 capsule (0.4 mg total) by mouth  daily after breakfast. 04/14/20  Yes Karamalegos, Devonne Doughty, DO  tiotropium (SPIRIVA HANDIHALER) 18 MCG inhalation capsule Place 1 capsule (18 mcg total) into inhaler and inhale daily. 11/04/17  Yes Karamalegos, Devonne Doughty, DO  Blood Glucose Monitoring Suppl (ONE TOUCH ULTRA SYSTEM KIT) w/Device KIT 1 kit by Does not apply route once. CHeck blood glucose in the AM three times weekly. 05/15/15   Krebs, Genevie Cheshire, NP  dicyclomine (BENTYL) 10 MG capsule dicyclomine 10 mg capsule    [provider]  FLOVENT DISKUS 50 MCG/BLIST diskus inhaler INHALE 1 PUFF BY MOUTH INTO THE LUNGS 2 TIMES DAILY  09/21/18   Parks Ranger, Devonne Doughty, DO  FLUoxetine (PROZAC) 20 MG capsule Take 20 mg by mouth 2 (two) times daily.    [provider]  glucose blood test strip Check blood glucose three times weekly. 02/07/17   Karamalegos, Devonne Doughty, DO  Lancets [facility] ULTRASOFT) lancets Use as instructed 02/07/17   Olin Hauser, DO  mupirocin cream (BACTROBAN) 2 % Apply 1 application topically 2 (two) times daily. For 1-2 weeks, as needed Patient not taking: Reported on 05/08/2020 01/21/20   Olin Hauser, DO  neomycin-polymyxin-hydrocortisone (CORTISPORIN) 3.5-10000-1 OTIC suspension  05/12/17   [provider]  nystatin (MYCOSTATIN) 100000 UNIT/ML suspension TAKE 5 mls (1 TEASPOONFUL) BY MOUTH 4 TIMES DAILY. Patient not taking: Reported on 05/08/2020 09/03/18   Olin Hauser, DO  polyethylene glycol powder (GLYCOLAX/MIRALAX) 17 GM/SCOOP powder USE HALF UP TO 1 CAPFUL BY MOUTH DAILY FOR MAINTENANCE, IF ACUTE WORSENING CONSTIPATION CAN INCREASE TO 2 CAPFULS DAILY OR 1 CAPFUL TWICE DA Patient not taking: Reported on 05/08/2020 09/08/18   Olin Hauser, DO  potassium chloride (MICRO-K) 10 MEQ CR capsule  07/16/18   [provider]  triamcinolone cream (KENALOG) 0.5 % Apply 1 application topically 2 (two) times daily. To affected areas, for up to 2 weeks. Patient not taking: Reported on 05/08/2020 08/26/18   Olin Hauser, DO    Allergies as of 04/06/2020  . (No Known Allergies)    Family History  Problem Relation Age of Onset  . Kidney cancer Mother   . Prostate cancer Neg Hx   . Bladder Cancer Neg Hx     Social History   Socioeconomic History  . Marital status: Divorced    Spouse name: Not on file  . Number of children: Not on file  . Years of education: Not on file  . Highest education level: Not on file  Occupational History  . Not on file  Tobacco Use  . Smoking status: Current Every Day Smoker    Packs/day: 1.00     Years: 70.00    Pack years: 70.00    Types: Cigarettes  . Smokeless tobacco: Current User  Substance and Sexual Activity  . Alcohol use: No  . Drug use: No  . Sexual activity: Not on file  Other Topics Concern  . Not on file  Social History Narrative  . Not on file   Social Determinants of Health   Financial Resource Strain: Not on file  Food Insecurity: Not on file  Transportation Needs: Not on file  Physical Activity: Not on file  Stress: Not on file  Social Connections: Not on file  Intimate Partner Violence: Not on file    Review of Systems: See HPI, otherwise negative ROS  Physical Exam: BP 118/74   Pulse (!) 58   Temp (!) 97.4 F (36.3 C) (Temporal)   Resp 16  Ht 5' 8.5" (1.74 m)   Wt 101.6 kg   SpO2 98%   BMI 33.56 kg/m  General:   Alert,  pleasant and cooperative in NAD Head:  Normocephalic and atraumatic. Respiratory:  Normal work of breathing. Cardiovascular:  RRR  Impression/Plan: Charles Ewing is here for cataract surgery.  Risks, benefits, limitations, and alternatives regarding cataract surgery have been reviewed with the patient.  Questions have been answered.  All parties agreeable.   Birder Robson, MD  05/09/2020, 1:53 PM

## 2020-05-09 NOTE — Transfer of Care (Signed)
Immediate Anesthesia Transfer of Care Note  Patient: Charles Ewing  Procedure(s) Performed: CATARACT EXTRACTION PHACO AND INTRAOCULAR LENS PLACEMENT (IOC) LEFT DIABETIC 9.09 01:02.0 (Left Eye)  Patient Location: PACU  Anesthesia Type: MAC  Level of Consciousness: awake, alert  and patient cooperative  Airway and Oxygen Therapy: Patient Spontanous Breathing and Patient connected to supplemental oxygen  Post-op Assessment: Post-op Vital signs reviewed, Patient's Cardiovascular Status Stable, Respiratory Function Stable, Patent Airway and No signs of Nausea or vomiting  Post-op Vital Signs: Reviewed and stable  Complications: No complications documented.

## 2020-05-09 NOTE — Anesthesia Preprocedure Evaluation (Signed)
Anesthesia Evaluation  Patient identified by MRN, date of birth, ID band Patient awake    Reviewed: Allergy & Precautions, NPO status   Airway Mallampati: II  TM Distance: >3 FB     Dental   Pulmonary COPD, Current Smoker (70 pack years) and Patient abstained from smoking.,    Pulmonary exam normal        Cardiovascular hypertension, + CAD   Rhythm:Regular Rate:Normal  HLD   Neuro/Psych PSYCHIATRIC DISORDERS Anxiety Depression    GI/Hepatic GERD  ,  Endo/Other  Obesity - BMI 33  Renal/GU CRFRenal disease     Musculoskeletal  (+) Arthritis ,   Abdominal   Peds  Hematology   Anesthesia Other Findings   Reproductive/Obstetrics                             Anesthesia Physical Anesthesia Plan  ASA: III  Anesthesia Plan: MAC   Post-op Pain Management:    Induction: Intravenous  PONV Risk Score and Plan: TIVA, Midazolam and Treatment may vary due to age or medical condition  Airway Management Planned: Natural Airway and Nasal Cannula  Additional Equipment:   Intra-op Plan:   Post-operative Plan:   Informed Consent: I have reviewed the patients History and Physical, chart, labs and discussed the procedure including the risks, benefits and alternatives for the proposed anesthesia with the patient or authorized representative who has indicated his/her understanding and acceptance.       Plan Discussed with: CRNA  Anesthesia Plan Comments:         Anesthesia Quick Evaluation

## 2020-05-10 ENCOUNTER — Encounter: Payer: Self-pay | Admitting: Ophthalmology

## 2020-05-12 ENCOUNTER — Other Ambulatory Visit: Payer: Medicare Other

## 2020-05-15 DIAGNOSIS — H2511 Age-related nuclear cataract, right eye: Secondary | ICD-10-CM | POA: Diagnosis not present

## 2020-05-17 ENCOUNTER — Encounter: Payer: Self-pay | Admitting: Ophthalmology

## 2020-05-17 ENCOUNTER — Other Ambulatory Visit: Payer: Self-pay

## 2020-05-19 ENCOUNTER — Other Ambulatory Visit: Payer: Self-pay

## 2020-05-19 ENCOUNTER — Other Ambulatory Visit
Admission: RE | Admit: 2020-05-19 | Discharge: 2020-05-19 | Disposition: A | Discharge status: Home / Self Care | Payer: Medicare Other | Source: Ambulatory Visit | Attending: Ophthalmology | Admitting: Ophthalmology

## 2020-05-19 DIAGNOSIS — Z01812 Encounter for preprocedural laboratory examination: Secondary | ICD-10-CM | POA: Insufficient documentation

## 2020-05-19 DIAGNOSIS — Z20822 Contact with and (suspected) exposure to covid-19: Secondary | ICD-10-CM | POA: Insufficient documentation

## 2020-05-19 LAB — SARS CORONAVIRUS 2 (TAT 6-24 HRS): SARS Coronavirus 2: NEGATIVE

## 2020-05-23 ENCOUNTER — Other Ambulatory Visit: Payer: Self-pay

## 2020-05-23 ENCOUNTER — Ambulatory Visit: Payer: Medicare Other | Admitting: Anesthesiology

## 2020-05-23 ENCOUNTER — Encounter
Admission: RE | Disposition: A | Discharge status: Home / Self Care | Payer: Self-pay | Source: Home / Self Care | Attending: Ophthalmology

## 2020-05-23 ENCOUNTER — Encounter: Payer: Self-pay | Admitting: Ophthalmology

## 2020-05-23 ENCOUNTER — Ambulatory Visit
Admission: RE | Admit: 2020-05-23 | Discharge: 2020-05-23 | Disposition: A | Discharge status: Home / Self Care | Payer: Medicare Other | Attending: Ophthalmology | Admitting: Ophthalmology

## 2020-05-23 DIAGNOSIS — E1136 Type 2 diabetes mellitus with diabetic cataract: Secondary | ICD-10-CM | POA: Insufficient documentation

## 2020-05-23 DIAGNOSIS — J449 Chronic obstructive pulmonary disease, unspecified: Secondary | ICD-10-CM | POA: Insufficient documentation

## 2020-05-23 DIAGNOSIS — K219 Gastro-esophageal reflux disease without esophagitis: Secondary | ICD-10-CM | POA: Diagnosis not present

## 2020-05-23 DIAGNOSIS — Z7984 Long term (current) use of oral hypoglycemic drugs: Secondary | ICD-10-CM | POA: Diagnosis not present

## 2020-05-23 DIAGNOSIS — Z7951 Long term (current) use of inhaled steroids: Secondary | ICD-10-CM | POA: Insufficient documentation

## 2020-05-23 DIAGNOSIS — Z8051 Family history of malignant neoplasm of kidney: Secondary | ICD-10-CM | POA: Insufficient documentation

## 2020-05-23 DIAGNOSIS — Z961 Presence of intraocular lens: Secondary | ICD-10-CM | POA: Insufficient documentation

## 2020-05-23 DIAGNOSIS — H2511 Age-related nuclear cataract, right eye: Secondary | ICD-10-CM | POA: Insufficient documentation

## 2020-05-23 DIAGNOSIS — I10 Essential (primary) hypertension: Secondary | ICD-10-CM | POA: Diagnosis not present

## 2020-05-23 DIAGNOSIS — Z7982 Long term (current) use of aspirin: Secondary | ICD-10-CM | POA: Diagnosis not present

## 2020-05-23 DIAGNOSIS — H25811 Combined forms of age-related cataract, right eye: Secondary | ICD-10-CM | POA: Diagnosis not present

## 2020-05-23 DIAGNOSIS — F17210 Nicotine dependence, cigarettes, uncomplicated: Secondary | ICD-10-CM | POA: Diagnosis not present

## 2020-05-23 DIAGNOSIS — Z791 Long term (current) use of non-steroidal anti-inflammatories (NSAID): Secondary | ICD-10-CM | POA: Insufficient documentation

## 2020-05-23 DIAGNOSIS — Z9842 Cataract extraction status, left eye: Secondary | ICD-10-CM | POA: Diagnosis not present

## 2020-05-23 HISTORY — PX: CATARACT EXTRACTION W/PHACO: SHX586

## 2020-05-23 LAB — GLUCOSE, CAPILLARY
Glucose-Capillary: 120 mg/dL — ABNORMAL HIGH (ref 70–99)
Glucose-Capillary: 122 mg/dL — ABNORMAL HIGH (ref 70–99)

## 2020-05-23 SURGERY — PHACOEMULSIFICATION, CATARACT, WITH IOL INSERTION
Anesthesia: Monitor Anesthesia Care | Site: Eye | Laterality: Right

## 2020-05-23 MED ORDER — NA CHONDROIT SULF-NA HYALURON 40-17 MG/ML IO SOLN
INTRAOCULAR | Status: DC | PRN
  Administered 2020-05-23: 1 mL via INTRAOCULAR

## 2020-05-23 MED ORDER — ACETAMINOPHEN 325 MG PO TABS: 325.0000 mg | ORAL_TABLET | Freq: Once | ORAL | Status: DC

## 2020-05-23 MED ORDER — LIDOCAINE HCL (PF) 2 % IJ SOLN
INTRAOCULAR | Status: DC | PRN
  Administered 2020-05-23: 2 mL

## 2020-05-23 MED ORDER — MIDAZOLAM HCL 2 MG/2ML IJ SOLN
INTRAMUSCULAR | Status: DC | PRN
  Administered 2020-05-23: 1 mg via INTRAVENOUS

## 2020-05-23 MED ORDER — MOXIFLOXACIN HCL 0.5 % OP SOLN
OPHTHALMIC | Status: DC | PRN
  Administered 2020-05-23: 0.2 mL via OPHTHALMIC

## 2020-05-23 MED ORDER — FENTANYL CITRATE (PF) 100 MCG/2ML IJ SOLN
INTRAMUSCULAR | Status: DC | PRN
  Administered 2020-05-23: 50 ug via INTRAVENOUS

## 2020-05-23 MED ORDER — BRIMONIDINE TARTRATE-TIMOLOL 0.2-0.5 % OP SOLN
OPHTHALMIC | Status: DC | PRN
  Administered 2020-05-23: 1 [drp] via OPHTHALMIC

## 2020-05-23 MED ORDER — LACTATED RINGERS IV SOLN: INTRAVENOUS | Status: DC

## 2020-05-23 MED ORDER — EPINEPHRINE PF 1 MG/ML IJ SOLN
INTRAOCULAR | Status: DC | PRN
  Administered 2020-05-23: 117 mL via OPHTHALMIC

## 2020-05-23 MED ORDER — ARMC OPHTHALMIC DILATING DROPS
1.0000 "application " | OPHTHALMIC | Status: DC | PRN
  Administered 2020-05-23 (×3): 1 via OPHTHALMIC

## 2020-05-23 MED ORDER — TETRACAINE HCL 0.5 % OP SOLN
1.0000 [drp] | OPHTHALMIC | Status: DC | PRN
  Administered 2020-05-23 (×3): 1 [drp] via OPHTHALMIC

## 2020-05-23 MED ORDER — ACETAMINOPHEN 160 MG/5ML PO SOLN: 325.0000 mg | Freq: Once | ORAL | Status: DC

## 2020-05-23 SURGICAL SUPPLY — 17 items
CANNULA ANT/CHMB 27GA (MISCELLANEOUS) ×4 IMPLANT
GLOVE SURG TRIUMPH 8.0 PF LTX (GLOVE) ×4 IMPLANT
GOWN STRL REUS W/ TWL LRG LVL3 (GOWN DISPOSABLE) ×2 IMPLANT
GOWN STRL REUS W/TWL LRG LVL3 (GOWN DISPOSABLE) ×4
LENS IOL TECNIS EYHANCE 21.5 (Intraocular Lens) ×2 IMPLANT
MARKER SKIN DUAL TIP RULER LAB (MISCELLANEOUS) ×2 IMPLANT
NEEDLE FILTER BLUNT 18X 1/2SAF (NEEDLE) ×1
NEEDLE FILTER BLUNT 18X1 1/2 (NEEDLE) ×1 IMPLANT
PACK EYE AFTER SURG (MISCELLANEOUS) ×2 IMPLANT
PACK OPTHALMIC (MISCELLANEOUS) ×2 IMPLANT
PACK PORFILIO (MISCELLANEOUS) ×2 IMPLANT
SUT ETHILON 10-0 CS-B-6CS-B-6 (SUTURE)
SUTURE EHLN 10-0 CS-B-6CS-B-6 (SUTURE) IMPLANT
SYR 3ML LL SCALE MARK (SYRINGE) ×2 IMPLANT
SYR TB 1ML LUER SLIP (SYRINGE) ×2 IMPLANT
WATER STERILE IRR 250ML POUR (IV SOLUTION) ×2 IMPLANT
WIPE NON LINTING 3.25X3.25 (MISCELLANEOUS) ×2 IMPLANT

## 2020-05-23 NOTE — H&P (Signed)
[facility]   Primary Care Physician:  Olin Hauser, DO Ophthalmologist: Dr. George Ina  Pre-Procedure History & Physical: HPI:  Charles Ewing is a 80 y.o. male here for cataract surgery.   Past Medical History:  Diagnosis Date  . Abdominal aortic aneurysm (AAA) ([place]) 2013   found at [facility]-  . Anxiety   . Arrhythmia   . Arthritis   . Colon polyp   . COPD (chronic obstructive pulmonary disease) ([place])   . Coronary artery disease   . Depression   . GERD (gastroesophageal reflux disease)   . Hyperlipidemia   . Hypertension   . Skin cancer   . Thrush     Past Surgical History:  Procedure Laterality Date  . CATARACT EXTRACTION W/PHACO Left 05/09/2020   Procedure: CATARACT EXTRACTION PHACO AND INTRAOCULAR LENS PLACEMENT (IOC) LEFT DIABETIC 9.09 01:02.0;  Surgeon: Birder Robson, MD;  Location: [place];  Service: Ophthalmology;  Laterality: Left;  . Colon polyp surgery    . SKIN CANCER EXCISION    . TRANSURETHRAL RESECTION OF PROSTATE  2010    Prior to Admission medications   Medication Sig Start Date End Date Taking? Authorizing Provider  albuterol (VENTOLIN HFA) 108 (90 Base) MCG/ACT inhaler INHALE 2 PUFFS INTO THE LUNGS EVERY 6 HOURS AS NEEDED FOR WHEEZING OR SHORTNESS OF BREATH. 09/23/19  Yes Karamalegos, Devonne Doughty, DO  amLODipine (NORVASC) 5 MG tablet  05/16/17  Yes [provider]  aspirin EC 81 MG tablet Take 81 mg by mouth every evening.   Yes [provider]  baclofen (LIORESAL) 10 MG tablet Take 0.5-1 tablets (5-10 mg total) by mouth 3 (three) times daily as needed for muscle spasms. 04/14/20  Yes Karamalegos, Devonne Doughty, DO  Blood Glucose Monitoring Suppl (ONE TOUCH ULTRA SYSTEM KIT) w/Device KIT 1 kit by Does not apply route once. CHeck blood glucose in the AM three times weekly. 05/15/15  Yes Krebs, Genevie Cheshire, NP  clotrimazole (MYCELEX) 10 MG troche  10/27/19  Yes [provider]  esomeprazole (NEXIUM) 40 MG capsule  esomeprazole magnesium 40 mg capsule,delayed release   Yes [provider]  FLOVENT DISKUS 50 MCG/BLIST diskus inhaler INHALE 1 PUFF BY MOUTH INTO THE LUNGS 2 TIMES DAILY 09/21/18  Yes Karamalegos, Devonne Doughty, DO  FLUoxetine (PROZAC) 20 MG capsule Take 20 mg by mouth 2 (two) times daily.   Yes [provider]  fluticasone (FLONASE) 50 MCG/ACT nasal spray PLACE 2 SPRAYS INTO BOTH NOSTRILS DAILY 05/31/19  Yes Karamalegos, Devonne Doughty, DO  furosemide (LASIX) 20 MG tablet TAKE ONE-HALF TO ONE TABLET BY MOUTH DAILY 02/24/20  Yes Karamalegos, Alexander J, DO  glucose blood test strip Check blood glucose three times weekly. 02/07/17  Yes Karamalegos, Devonne Doughty, DO  hydrochlorothiazide (HYDRODIURIL) 25 MG tablet  07/09/18  Yes [provider]  Lancets [facility] ULTRASOFT) lancets Use as instructed 02/07/17  Yes Karamalegos, Devonne Doughty, DO  loratadine (CLARITIN) 10 MG tablet Take 10 mg by mouth every evening.   Yes [provider]  LORazepam (ATIVAN) 0.5 MG tablet TAKE 1 TABLET BY MOUTH AT BEDTIME AS NEEDED FOR ANXIETY 09/27/19  Yes Karamalegos, Devonne Doughty, DO  losartan (COZAAR) 25 MG tablet TAKE 1 TABLET BY MOUTH DAILY 03/07/20  Yes Karamalegos, Devonne Doughty, DO  meloxicam (MOBIC) 15 MG tablet meloxicam 15 mg tablet  TAKE 1 TABLET BY MOUTH DAILY WITH MEALS   Yes [provider]  metFORMIN (GLUCOPHAGE-XR) 500 MG 24 hr tablet TAKE 1 TABLET BY  MOUTH DAILY WITH BREAKFAST 03/07/20  Yes Karamalegos, Devonne Doughty, DO  metoprolol succinate (TOPROL-XL) 50 MG 24 hr tablet Take 1 tablet (50 mg total) by mouth daily. Take with or immediately following a meal. 11/17/15  Yes Krebs, Amy Lauren, NP  pantoprazole (PROTONIX) 20 MG tablet Take 20 mg by mouth daily. 01/10/20  Yes [provider]  potassium chloride (MICRO-K) 10 MEQ CR capsule  07/16/18  Yes [provider]  simvastatin (ZOCOR) 20 MG tablet Take 20 mg by mouth at bedtime.   Yes [provider]   sucralfate (CARAFATE) 1 g tablet Take 1 tablet (1 g total) by mouth 4 (four) times daily -  with meals and at bedtime. As needed 04/14/20  Yes Karamalegos, Devonne Doughty, DO  tamsulosin (FLOMAX) 0.4 MG CAPS capsule Take 1 capsule (0.4 mg total) by mouth daily after breakfast. 04/14/20  Yes Karamalegos, Alexander J, DO  tiotropium (SPIRIVA HANDIHALER) 18 MCG inhalation capsule Place 1 capsule (18 mcg total) into inhaler and inhale daily. 11/04/17  Yes Karamalegos, Devonne Doughty, DO  triamcinolone cream (KENALOG) 0.5 % Apply 1 application topically 2 (two) times daily. To affected areas, for up to 2 weeks. 08/26/18  Yes Karamalegos, Devonne Doughty, DO  dicyclomine (BENTYL) 10 MG capsule dicyclomine 10 mg capsule    [provider]  Elastic Bandages & Supports (MEDICAL COMPRESSION STOCKINGS) MISC 1 each by Does not apply route daily. 06/12/15   Luciana Axe, NP  mupirocin cream (BACTROBAN) 2 % Apply 1 application topically 2 (two) times daily. For 1-2 weeks, as needed Patient not taking: Reported on 05/08/2020 01/21/20   Olin Hauser, DO  neomycin-polymyxin-hydrocortisone (CORTISPORIN) 3.5-10000-1 OTIC suspension  05/12/17   [provider]  nystatin (MYCOSTATIN) 100000 UNIT/ML suspension TAKE 5 mls (1 TEASPOONFUL) BY MOUTH 4 TIMES DAILY. Patient not taking: Reported on 05/08/2020 09/03/18   Olin Hauser, DO  polyethylene glycol powder (GLYCOLAX/MIRALAX) 17 GM/SCOOP powder USE HALF UP TO 1 CAPFUL BY MOUTH DAILY FOR MAINTENANCE, IF ACUTE WORSENING CONSTIPATION CAN INCREASE TO 2 CAPFULS DAILY OR 1 CAPFUL TWICE DA Patient not taking: Reported on 05/08/2020 09/08/18   Olin Hauser, DO    Allergies as of 04/06/2020  . (No Known Allergies)    Family History  Problem Relation Age of Onset  . Kidney cancer Mother   . Prostate cancer Neg Hx   . Bladder Cancer Neg Hx     Social History   Socioeconomic History  . Marital status: Divorced    Spouse name: Not on  file  . Number of children: Not on file  . Years of education: Not on file  . Highest education level: Not on file  Occupational History  . Not on file  Tobacco Use  . Smoking status: Current Every Day Smoker    Packs/day: 1.00    Years: 70.00    Pack years: 70.00    Types: Cigarettes  . Smokeless tobacco: Current User  Substance and Sexual Activity  . Alcohol use: No  . Drug use: No  . Sexual activity: Not on file  Other Topics Concern  . Not on file  Social History Narrative  . Not on file   Social Determinants of Health   Financial Resource Strain: Not on file  Food Insecurity: Not on file  Transportation Needs: Not on file  Physical Activity: Not on file  Stress: Not on file  Social Connections: Not on file  Intimate Partner Violence: Not on file  Review of Systems: See HPI, otherwise negative ROS  Physical Exam: BP 113/65   Pulse 69   Temp (!) 96.9 F (36.1 C) (Temporal)   Ht '5\' 8"'  (1.727 m)   Wt 99.3 kg   SpO2 95%   BMI 33.30 kg/m  General:   Alert,  pleasant and cooperative in NAD Head:  Normocephalic and atraumatic. Respiratory:  Normal work of breathing. Cardiovascular:  RRR  Impression/Plan: LYRIQ JARCHOW is here for cataract surgery.  Risks, benefits, limitations, and alternatives regarding cataract surgery have been reviewed with the patient.  Questions have been answered.  All parties agreeable.   Birder Robson, MD  05/23/2020, 8:18 AM

## 2020-05-23 NOTE — Anesthesia Preprocedure Evaluation (Signed)
Anesthesia Evaluation  Patient identified by MRN, date of birth, ID band Patient awake    Reviewed: Allergy & Precautions, H&P , NPO status , Patient's Chart, lab work & pertinent test results  Airway Mallampati: II  TM Distance: >3 FB Neck ROM: full    Dental  (+) Edentulous Upper, Edentulous Lower   Pulmonary COPD, Current Smoker and Patient abstained from smoking.,     + wheezing      Cardiovascular hypertension, + CAD  Normal cardiovascular exam Rhythm:regular Rate:Normal  HLD   Neuro/Psych PSYCHIATRIC DISORDERS Anxiety Depression    GI/Hepatic GERD  ,  Endo/Other  Obesity - BMI 33  Renal/GU CRFRenal disease     Musculoskeletal  (+) Arthritis ,   Abdominal   Peds  Hematology   Anesthesia Other Findings   Reproductive/Obstetrics                             Anesthesia Physical  Anesthesia Plan  ASA: III  Anesthesia Plan: MAC   Post-op Pain Management:    Induction: Intravenous  PONV Risk Score and Plan: 1 and Treatment may vary due to age or medical condition, TIVA and Midazolam  Airway Management Planned: Natural Airway and Nasal Cannula  Additional Equipment:   Intra-op Plan:   Post-operative Plan:   Informed Consent: I have reviewed the patients History and Physical, chart, labs and discussed the procedure including the risks, benefits and alternatives for the proposed anesthesia with the patient or authorized representative who has indicated his/her understanding and acceptance.     Dental Advisory Given  Plan Discussed with: CRNA  Anesthesia Plan Comments:         Anesthesia Quick Evaluation

## 2020-05-23 NOTE — Anesthesia Procedure Notes (Signed)
Procedure Name: MAC Date/Time: 05/23/2020 8:26 AM Performed by: Cameron Ali, CRNA Pre-anesthesia Checklist: Patient identified, Emergency Drugs available, Suction available, Timeout performed and Patient being monitored Patient Re-evaluated:Patient Re-evaluated prior to induction Oxygen Delivery Method: Nasal cannula Placement Confirmation: positive ETCO2

## 2020-05-23 NOTE — Op Note (Signed)
PREOPERATIVE DIAGNOSIS:  Nuclear sclerotic cataract of the right eye.   POSTOPERATIVE DIAGNOSIS:  Cataract   OPERATIVE PROCEDURE:@   SURGEON:  Birder Robson, MD.   ANESTHESIA:  Anesthesiologist: Ronelle Nigh, MD CRNA: Cameron Ali, CRNA  1.      Managed anesthesia care. 2.      0.20ml of Shugarcaine was instilled in the eye following the paracentesis.   COMPLICATIONS:  None.   TECHNIQUE:   Stop and chop   DESCRIPTION OF PROCEDURE:  The patient was examined and consented in the preoperative holding area where the aforementioned topical anesthesia was applied to the right eye and then brought back to the Operating Room where the right eye was prepped and draped in the usual sterile ophthalmic fashion and a lid speculum was placed. A paracentesis was created with the side port blade and the anterior chamber was filled with viscoelastic. A near clear corneal incision was performed with the steel keratome. A continuous curvilinear capsulorrhexis was performed with a cystotome followed by the capsulorrhexis forceps. Hydrodissection and hydrodelineation were carried out with BSS on a blunt cannula. The lens was removed in a stop and chop  technique and the remaining cortical material was removed with the irrigation-aspiration handpiece. The capsular bag was inflated with viscoelastic and the Technis [id number]  lens was placed in the capsular bag without complication. The remaining viscoelastic was removed from the eye with the irrigation-aspiration handpiece. The wounds were hydrated. The anterior chamber was flushed with BSS and the eye was inflated to physiologic pressure. 0.71ml of Vigamox was placed in the anterior chamber. The wounds were found to be water tight. The eye was dressed with Combigan. The patient was given protective glasses to wear throughout the day and a shield with which to sleep tonight. The patient was also given drops with which to begin a drop regimen today and will follow-up  with me in one day. Implant Name Type Inv. Item Serial No. Manufacturer Lot No. LRB No. Used Action  LENS IOL TECNIS EYHANCE 21.5 - [id number] Intraocular Lens LENS IOL TECNIS EYHANCE 21.5 [id number] JOHNSON   Right 1 Implanted   Procedure(s): CATARACT EXTRACTION PHACO AND INTRAOCULAR LENS PLACEMENT (IOC) RIGHT DIABETIC 8.13 01:00.3 (Right)  Electronically signed: Birder Robson 05/23/2020 8:48 AM

## 2020-05-23 NOTE — Transfer of Care (Signed)
Immediate Anesthesia Transfer of Care Note  Patient: Charles Ewing  Procedure(s) Performed: CATARACT EXTRACTION PHACO AND INTRAOCULAR LENS PLACEMENT (IOC) RIGHT DIABETIC 8.13 01:00.3 (Right Eye)  Patient Location: PACU  Anesthesia Type: MAC  Level of Consciousness: awake, alert  and patient cooperative  Airway and Oxygen Therapy: Patient Spontanous Breathing and Patient connected to supplemental oxygen  Post-op Assessment: Post-op Vital signs reviewed, Patient's Cardiovascular Status Stable, Respiratory Function Stable, Patent Airway and No signs of Nausea or vomiting  Post-op Vital Signs: Reviewed and stable  Complications: No complications documented.

## 2020-05-23 NOTE — Anesthesia Postprocedure Evaluation (Signed)
Anesthesia Post Note  Patient: Charles Ewing  Procedure(s) Performed: CATARACT EXTRACTION PHACO AND INTRAOCULAR LENS PLACEMENT (IOC) RIGHT DIABETIC 8.13 01:00.3 (Right Eye)     Patient location during evaluation: PACU Anesthesia Type: MAC Level of consciousness: awake and alert and oriented Pain management: satisfactory to patient Vital Signs Assessment: post-procedure vital signs reviewed and stable Respiratory status: spontaneous breathing, nonlabored ventilation and respiratory function stable Cardiovascular status: blood pressure returned to baseline and stable Postop Assessment: Adequate PO intake and No signs of nausea or vomiting Anesthetic complications: no   No complications documented.  Raliegh Ip

## 2020-05-24 ENCOUNTER — Encounter: Payer: Self-pay | Admitting: Ophthalmology

## 2020-05-25 ENCOUNTER — Ambulatory Visit: Payer: Self-pay | Admitting: *Deleted

## 2020-05-25 NOTE — Telephone Encounter (Signed)
Pt called in c/o coughing up green mucus that started yesterday.  Last Friday he started feeling bad and developed a cough.   It was white mucus but turned green yesterday.   He has COPD and was told any time he has green mucus to call his doctor right away.   No fever.  He was tested for covid on 05/19/2020 because he had cataract surgery so they had him tested for that.     His only other symptom is a runny nose.  There are no appts available with Dr. Parks Ranger for the next 2 days so I sent a note to [facility] to see if he could be a possible work in.  Pt was agreeable to this plan.  He can be reached at [phone].  I sent my notes high priority.      Reason for Disposition . [1] Known COPD or other severe lung disease (i.e., bronchiectasis, cystic fibrosis, lung surgery) AND [2] worsening symptoms (i.e., increased sputum purulence or amount, increased breathing difficulty  Answer Assessment - Initial Assessment Questions 1. ONSET: "When did the cough begin?"      Last Friday I started getting sick.   I'm weak and feeling worse.   I started coughing white mucus now it's green.   I have COPD.  Coughing the green mucus since yesterday.    I'm had cataract  surgery and I don't have fever when they checked me.   2. SEVERITY: "How bad is the cough today?"      I am coughing a lot. 3. SPUTUM: "Describe the color of your sputum" (none, dry cough; clear, white, yellow, green)     Green mucus 4. HEMOPTYSIS: "Are you coughing up any blood?" If so ask: "How much?" (flecks, streaks, tablespoons, etc.)     No 5. DIFFICULTY BREATHING: "Are you having difficulty breathing?" If Yes, ask: "How bad is it?" (e.g., mild, moderate, severe)    - MILD: No SOB at rest, mild SOB with walking, speaks normally in sentences, can lay down, no retractions, pulse < 100.    - MODERATE: SOB at rest, SOB with minimal exertion and prefers to sit, cannot lie down flat, speaks in phrases, mild  retractions, audible wheezing, pulse 100-120.    - SEVERE: Very SOB at rest, speaks in single words, struggling to breathe, sitting hunched forward, retractions, pulse > 120      Shortness of breath pretty bad right now especially with exertion. 6. FEVER: "Do you have a fever?" If Yes, ask: "What is your temperature, how was it measured, and when did it start?"     No 7. CARDIAC HISTORY: "Do you have any history of heart disease?" (e.g., heart attack, congestive heart failure)      No 8. LUNG HISTORY: "Do you have any history of lung disease?"  (e.g., pulmonary embolus, asthma, emphysema)     COPD 9. PE RISK FACTORS: "Do you have a history of blood clots?" (or: recent major surgery, recent prolonged travel, bedridden)     No blood clots in lungs 10. OTHER SYMPTOMS: "Do you have any other symptoms?" (e.g., runny nose, wheezing, chest pain)       Runny nose, no sore throat.    11. PREGNANCY: "Is there any chance you are pregnant?" "When was your last menstrual period?"       N/A 12. TRAVEL: "Have you traveled out of the country in the last month?" (e.g., travel history, exposures)  No  Protocols used: COUGH - ACUTE PRODUCTIVE-A-AH

## 2020-05-25 NOTE — Telephone Encounter (Signed)
I called the patient and notified him that unfortunately Dr. Raliegh Ip doesn't have anything available today or tomorrow. I recommended that he seek care at the Urgent Care or  Acute Care. I also informed the patient if anything becomes available we will give him a call. He verbalize understanding, no questions or concerns.

## 2020-05-30 ENCOUNTER — Other Ambulatory Visit: Payer: Self-pay | Admitting: Family Medicine

## 2020-05-30 DIAGNOSIS — F419 Anxiety disorder, unspecified: Secondary | ICD-10-CM

## 2020-05-30 DIAGNOSIS — F5105 Insomnia due to other mental disorder: Secondary | ICD-10-CM

## 2020-05-30 DIAGNOSIS — F99 Mental disorder, not otherwise specified: Secondary | ICD-10-CM

## 2020-05-30 NOTE — Telephone Encounter (Signed)
Requested medication (s) are due for refill today no  Requested medication (s) are on the active medication list: yes   Last refill:  09/28/2019  Future visit scheduled: no  Notes to clinic:  this refill cannot be delegated    Requested Prescriptions  Pending Prescriptions Disp Refills   LORazepam (ATIVAN) 0.5 MG tablet [Pharmacy Med Name: LORAZEPAM 0.5 MG TAB] 30 tablet     Sig: TAKE 1 TABLET BY MOUTH AT BEDTIME AS NEEDED FOR ANXIETY      Not Delegated - Psychiatry:  Anxiolytics/Hypnotics Failed - 05/30/2020  9:26 AM      Failed - This refill cannot be delegated      Failed - Urine Drug Screen completed in last 360 days      Passed - Valid encounter within last 6 months    Recent Outpatient Visits           1 month ago Intercostal muscle strain, initial encounter   [place], [place]   4 months ago [place] (actinic keratosis)   [place], DO   7 months ago Acute cystitis without hematuria   [facility] Olin Hauser, DO   1 year ago Oral ulcer   [place], DO   1 year ago Annual physical exam   [facility] Olin Hauser, DO       Future Appointments             In 1 month Nehemiah Massed Monia Sabal, MD [place]

## 2020-07-05 ENCOUNTER — Ambulatory Visit: Payer: Medicare Other | Admitting: Dermatology

## 2020-07-17 ENCOUNTER — Other Ambulatory Visit: Payer: Self-pay | Admitting: Family Medicine

## 2020-07-17 DIAGNOSIS — R600 Localized edema: Secondary | ICD-10-CM

## 2020-07-19 DIAGNOSIS — I10 Essential (primary) hypertension: Secondary | ICD-10-CM | POA: Diagnosis not present

## 2020-07-19 DIAGNOSIS — R0602 Shortness of breath: Secondary | ICD-10-CM | POA: Diagnosis not present

## 2020-07-19 DIAGNOSIS — I714 Abdominal aortic aneurysm, without rupture: Secondary | ICD-10-CM | POA: Diagnosis not present

## 2020-07-19 DIAGNOSIS — R600 Localized edema: Secondary | ICD-10-CM | POA: Diagnosis not present

## 2020-07-19 DIAGNOSIS — I2510 Atherosclerotic heart disease of native coronary artery without angina pectoris: Secondary | ICD-10-CM | POA: Diagnosis not present

## 2020-07-19 DIAGNOSIS — E785 Hyperlipidemia, unspecified: Secondary | ICD-10-CM | POA: Diagnosis not present

## 2020-07-19 DIAGNOSIS — J449 Chronic obstructive pulmonary disease, unspecified: Secondary | ICD-10-CM | POA: Diagnosis not present

## 2020-08-02 DIAGNOSIS — K121 Other forms of stomatitis: Secondary | ICD-10-CM | POA: Diagnosis not present

## 2020-08-02 DIAGNOSIS — Z720 Tobacco use: Secondary | ICD-10-CM | POA: Diagnosis not present

## 2020-08-22 ENCOUNTER — Telehealth: Payer: Self-pay | Admitting: Family Medicine

## 2020-08-22 NOTE — Telephone Encounter (Signed)
Copied from [place] [id number]. Topic: Medicare AWV >> Aug 22, 2020  1:02 PM Cher Nakai R wrote: Reason for CRM:  No answer unable to leave a  message for patient to call back and schedule Medicare Annual Wellness Visit (AWV) to be done virtually or by telephone.  No hx of AWV eligible as of 02/11/2009 awvi  Please schedule at anytime with [facility].      40 Minutes appointment   Any questions, please call me at [phone]

## 2020-08-28 ENCOUNTER — Other Ambulatory Visit: Payer: Self-pay | Admitting: Family Medicine

## 2020-08-28 DIAGNOSIS — I10 Essential (primary) hypertension: Secondary | ICD-10-CM

## 2020-08-28 DIAGNOSIS — R7303 Prediabetes: Secondary | ICD-10-CM

## 2020-08-28 DIAGNOSIS — R809 Proteinuria, unspecified: Secondary | ICD-10-CM

## 2020-09-04 ENCOUNTER — Other Ambulatory Visit: Payer: Self-pay

## 2020-09-04 ENCOUNTER — Encounter: Payer: Self-pay | Admitting: Family Medicine

## 2020-09-04 ENCOUNTER — Ambulatory Visit (INDEPENDENT_AMBULATORY_CARE_PROVIDER_SITE_OTHER): Payer: Medicare Other | Admitting: Family Medicine

## 2020-09-04 VITALS — BP 113/58 | HR 93 | Ht 68.0 in | Wt 220.8 lb

## 2020-09-04 DIAGNOSIS — R600 Localized edema: Secondary | ICD-10-CM

## 2020-09-04 DIAGNOSIS — I872 Venous insufficiency (chronic) (peripheral): Secondary | ICD-10-CM | POA: Diagnosis not present

## 2020-09-04 NOTE — Patient Instructions (Addendum)
Thank you for coming to the office today.  Referral to Vein doctors  [facility] [place], [place] [id number]  Main: [phone]   Keep on Furosemide '20mg'$  whole pill, can take TWO pills if need.  Use RICE therapy: - R - Rest / relative rest with activity modification avoid overuse of joint - I - Ice packs (make sure you use a towel or sock / something to protect skin) - C - Compression with stocking / or ACE wrap to apply pressure and reduce swelling allowing more support - E - Elevation - if significant swelling, lift leg above heart level (toes above your nose) to help reduce swelling, most helpful at night after day of being on your feet   Please schedule a Follow-up Appointment to: Return in about 6 weeks (around 10/16/2020) for 6 weeks Annual Physical later AM apt with fasting LAB AFTER.  If you have any other questions or concerns, please feel free to call the office or send a message through [place]. You may also schedule an earlier appointment if necessary.  Additionally, you may be receiving a survey about your experience at our office within a few days to 1 week by e-mail or mail. We value your feedback.  [name], DO [place]

## 2020-09-04 NOTE — Progress Notes (Signed)
Subjective:    Patient ID: Charles Ewing, male    DOB: 1940-03-29, 80 y.o.   MRN: [id number]  Charles Ewing is a 80 y.o. male presenting on 09/04/2020 for Foot Swelling   HPI  Left Foot / Bilateral LE Edema Prior history Chronic problem, previously addressed. Previously on lasix PRN 10-'20mg'$  with good results. He was back on half pill then recently up to '20mg'$  whole pill PRN some improvement. Worse with heat and if on feet longer He has COPD and some dyspnea at baseline, but not having history of CHF or other causes for swelling. Due for upcoming labs in 6 weeks. Has not had ultrasound, not seen vascular Denies any redness of lower extremity or ulceration or weeping of fluid or other complication   Depression screen [facility] 2/9 05/25/2019 08/26/2018 04/09/2018  Decreased Interest 0 0 0  Down, Depressed, Hopeless 0 0 0  PHQ - 2 Score 0 0 0    Social History   Tobacco Use   Smoking status: Every Day    Packs/day: 1.00    Years: 70.00    Pack years: 70.00    Types: Cigarettes   Smokeless tobacco: Current  Substance Use Topics   Alcohol use: No   Drug use: No    Review of Systems Per HPI unless specifically indicated above     Objective:    BP (!) 113/58   Pulse 93   Ht '5\' 8"'$  (1.727 m)   Wt 220 lb 12.8 oz (100.2 kg)   SpO2 97%   BMI 33.57 kg/m   Wt Readings from Last 3 Encounters:  09/04/20 220 lb 12.8 oz (100.2 kg)  05/23/20 219 lb (99.3 kg)  05/09/20 224 lb (101.6 kg)    Physical Exam Vitals and nursing note reviewed.  Constitutional:      General: He is not in acute distress.    Appearance: He is well-developed. He is not diaphoretic.     Comments: Well-appearing, comfortable, cooperative  HENT:     Head: Normocephalic and atraumatic.  Eyes:     General:        Right eye: No discharge.        Left eye: No discharge.     Conjunctiva/sclera: Conjunctivae normal.  Neck:     Thyroid: No thyromegaly.  Cardiovascular:     Rate and Rhythm: Normal rate and  regular rhythm.     Pulses: Normal pulses.     Heart sounds: Normal heart sounds. No murmur heard. Pulmonary:     Effort: Pulmonary effort is normal. No respiratory distress.     Breath sounds: Normal breath sounds. No wheezing or rales.  Musculoskeletal:        General: Normal range of motion.     Cervical back: Normal range of motion and neck supple.     Right lower leg: Edema (+3 pitting edema to mid lower leg) present.     Left lower leg: Edema (+3 pitting edema to mid lower leg) present.  Lymphadenopathy:     Cervical: No cervical adenopathy.  Skin:    General: Skin is warm and dry.     Findings: No erythema or rash.  Neurological:     Mental Status: He is alert and oriented to person, place, and time. Mental status is at baseline.  Psychiatric:        Behavior: Behavior normal.     Comments: Well groomed, good eye contact, normal speech and thoughts   Results for orders placed  or performed during the hospital encounter of 05/23/20  Glucose, capillary  Result Value Ref Range   Glucose-Capillary 120 (H) 70 - 99 mg/dL  Glucose, capillary  Result Value Ref Range   Glucose-Capillary 122 (H) 70 - 99 mg/dL      Assessment & Plan:   Problem List Items Addressed This Visit     Venous insufficiency of both lower extremities   Relevant Orders   Ambulatory referral to Vascular Surgery   Bilateral lower extremity edema - Primary   Relevant Orders   Ambulatory referral to Vascular Surgery    B/L LE Edema with venous insufficiency history Clinically no other obvious cause of LE edema He does have Amlodipine '5mg'$  that could be contributing Otherwise no other medical cause Trial inc dose Furosemide '20mg'$  up to '40mg'$  dose PRN if needed Refer to Vascular for consult and further work up diagnostic Follow-up as planned 6 weeks with labs chemistry and can review meds, if needed we may be able to adjust or stop his Amlodipine if this is worsening swelling  Orders Placed This Encounter   Procedures   Ambulatory referral to Vascular Surgery    Referral Priority:   Routine    Referral Type:   Surgical    Referral Reason:   Specialty Services Required    Requested Specialty:   Vascular Surgery    Number of Visits Requested:   1     No orders of the defined types were placed in this encounter.     Follow up plan: Return in about 6 weeks (around 10/16/2020) for 6 weeks Annual Physical later AM apt with fasting LAB AFTER.   [name], [facility] 09/04/2020, 2:37 PM

## 2020-09-06 DIAGNOSIS — M79674 Pain in right toe(s): Secondary | ICD-10-CM | POA: Diagnosis not present

## 2020-09-06 DIAGNOSIS — M79675 Pain in left toe(s): Secondary | ICD-10-CM | POA: Diagnosis not present

## 2020-09-06 DIAGNOSIS — B351 Tinea unguium: Secondary | ICD-10-CM | POA: Diagnosis not present

## 2020-10-09 DIAGNOSIS — M47816 Spondylosis without myelopathy or radiculopathy, lumbar region: Secondary | ICD-10-CM | POA: Insufficient documentation

## 2020-10-09 DIAGNOSIS — M47896 Other spondylosis, lumbar region: Secondary | ICD-10-CM | POA: Diagnosis not present

## 2020-10-09 DIAGNOSIS — M545 Low back pain, unspecified: Secondary | ICD-10-CM | POA: Diagnosis not present

## 2020-10-20 ENCOUNTER — Other Ambulatory Visit: Payer: Self-pay | Admitting: Family Medicine

## 2020-10-20 DIAGNOSIS — R600 Localized edema: Secondary | ICD-10-CM

## 2020-10-20 DIAGNOSIS — R809 Proteinuria, unspecified: Secondary | ICD-10-CM

## 2020-10-20 DIAGNOSIS — I10 Essential (primary) hypertension: Secondary | ICD-10-CM

## 2020-10-21 NOTE — Telephone Encounter (Signed)
Requested medications are due for refill today.  Yes - a bit early on the losartan  Requested medications are on the active medications list.  yes  Last refill. Losartan - 08/28/2020, Lasix - 07/17/2020   Future visit scheduled.   yes  Notes to clinic.  Failed protocol d/t expired labs.

## 2020-10-30 ENCOUNTER — Other Ambulatory Visit: Payer: Medicare Other

## 2020-10-30 ENCOUNTER — Other Ambulatory Visit: Payer: Self-pay

## 2020-10-30 DIAGNOSIS — R7303 Prediabetes: Secondary | ICD-10-CM

## 2020-10-30 DIAGNOSIS — N138 Other obstructive and reflux uropathy: Secondary | ICD-10-CM | POA: Diagnosis not present

## 2020-10-30 DIAGNOSIS — E785 Hyperlipidemia, unspecified: Secondary | ICD-10-CM

## 2020-10-30 DIAGNOSIS — N401 Enlarged prostate with lower urinary tract symptoms: Secondary | ICD-10-CM

## 2020-10-31 LAB — CBC WITH DIFFERENTIAL/PLATELET
Absolute Monocytes: 506 cells/uL (ref 200–950)
Basophils Absolute: 33 cells/uL (ref 0–200)
Basophils Relative: 0.4 %
Eosinophils Absolute: 232 cells/uL (ref 15–500)
Eosinophils Relative: 2.8 %
HCT: 49.1 % (ref 38.5–50.0)
Hemoglobin: 16.1 g/dL (ref 13.2–17.1)
Lymphs Abs: 2374 cells/uL (ref 850–3900)
MCH: 31.1 pg (ref 27.0–33.0)
MCHC: 32.8 g/dL (ref 32.0–36.0)
MCV: 94.8 fL (ref 80.0–100.0)
MPV: 10.6 fL (ref 7.5–12.5)
Monocytes Relative: 6.1 %
Neutro Abs: 5154 cells/uL (ref 1500–7800)
Neutrophils Relative %: 62.1 %
Platelets: 188 10*3/uL (ref 140–400)
RBC: 5.18 10*6/uL (ref 4.20–5.80)
RDW: 13 % (ref 11.0–15.0)
Total Lymphocyte: 28.6 %
WBC: 8.3 10*3/uL (ref 3.8–10.8)

## 2020-10-31 LAB — LIPID PANEL
Cholesterol: 96 mg/dL (ref <200)
HDL: 40 mg/dL (ref >=40)
LDL Cholesterol (Calc): 39 mg/dL (calc)
Non-HDL Cholesterol (Calc): 56 mg/dL (calc) (ref <130)
Total CHOL/HDL Ratio: 2.4 (calc) (ref <5.0)
Triglycerides: 87 mg/dL (ref <150)

## 2020-10-31 LAB — COMPREHENSIVE METABOLIC PANEL
AG Ratio: 1.9 (calc) (ref 1.0–2.5)
ALT: 11 U/L (ref 9–46)
AST: 13 U/L (ref 10–35)
Albumin: 3.8 g/dL (ref 3.6–5.1)
Alkaline phosphatase (APISO): 51 U/L (ref 35–144)
BUN: 13 mg/dL (ref 7–25)
CO2: 30 mmol/L (ref 20–32)
Calcium: 8.6 mg/dL (ref 8.6–10.3)
Chloride: 101 mmol/L (ref 98–110)
Creat: 0.96 mg/dL (ref 0.70–1.22)
Globulin: 2 g/dL (calc) (ref 1.9–3.7)
Glucose, Bld: 99 mg/dL (ref 65–99)
Potassium: 4.2 mmol/L (ref 3.5–5.3)
Sodium: 139 mmol/L (ref 135–146)
Total Bilirubin: 0.7 mg/dL (ref 0.2–1.2)
Total Protein: 5.8 g/dL — ABNORMAL LOW (ref 6.1–8.1)

## 2020-10-31 LAB — HEMOGLOBIN A1C
Hgb A1c MFr Bld: 5.8 % of total Hgb — ABNORMAL HIGH (ref <5.7)
Mean Plasma Glucose: 120 mg/dL
eAG (mmol/L): 6.6 mmol/L

## 2020-10-31 LAB — PSA: PSA: 8.29 ng/mL — ABNORMAL HIGH (ref <=4.00)

## 2020-11-06 ENCOUNTER — Encounter: Payer: Self-pay | Admitting: Family Medicine

## 2020-11-06 ENCOUNTER — Other Ambulatory Visit: Payer: Self-pay

## 2020-11-06 ENCOUNTER — Ambulatory Visit (INDEPENDENT_AMBULATORY_CARE_PROVIDER_SITE_OTHER): Payer: Medicare Other | Admitting: Family Medicine

## 2020-11-06 ENCOUNTER — Other Ambulatory Visit: Payer: Self-pay | Admitting: Family Medicine

## 2020-11-06 VITALS — BP 109/62 | HR 64 | Ht 68.0 in | Wt 195.6 lb

## 2020-11-06 DIAGNOSIS — F4310 Post-traumatic stress disorder, unspecified: Secondary | ICD-10-CM

## 2020-11-06 DIAGNOSIS — J432 Centrilobular emphysema: Secondary | ICD-10-CM | POA: Diagnosis not present

## 2020-11-06 DIAGNOSIS — I714 Abdominal aortic aneurysm, without rupture, unspecified: Secondary | ICD-10-CM

## 2020-11-06 DIAGNOSIS — Z0000 Encounter for general adult medical examination without abnormal findings: Secondary | ICD-10-CM

## 2020-11-06 DIAGNOSIS — N401 Enlarged prostate with lower urinary tract symptoms: Secondary | ICD-10-CM | POA: Diagnosis not present

## 2020-11-06 DIAGNOSIS — N138 Other obstructive and reflux uropathy: Secondary | ICD-10-CM

## 2020-11-06 DIAGNOSIS — F419 Anxiety disorder, unspecified: Secondary | ICD-10-CM | POA: Diagnosis not present

## 2020-11-06 DIAGNOSIS — N183 Chronic kidney disease, stage 3 unspecified: Secondary | ICD-10-CM | POA: Diagnosis not present

## 2020-11-06 DIAGNOSIS — I25118 Atherosclerotic heart disease of native coronary artery with other forms of angina pectoris: Secondary | ICD-10-CM | POA: Diagnosis not present

## 2020-11-06 DIAGNOSIS — R7303 Prediabetes: Secondary | ICD-10-CM

## 2020-11-06 DIAGNOSIS — G8929 Other chronic pain: Secondary | ICD-10-CM

## 2020-11-06 DIAGNOSIS — M5441 Lumbago with sciatica, right side: Secondary | ICD-10-CM

## 2020-11-06 DIAGNOSIS — I129 Hypertensive chronic kidney disease with stage 1 through stage 4 chronic kidney disease, or unspecified chronic kidney disease: Secondary | ICD-10-CM | POA: Diagnosis not present

## 2020-11-06 MED ORDER — OXYCODONE HCL 5 MG PO TABS: 5.0000 mg | ORAL_TABLET | ORAL | 0 refills | Status: DC | PRN

## 2020-11-06 MED ORDER — FLUOXETINE HCL 20 MG PO CAPS: 20.0000 mg | ORAL_CAPSULE | Freq: Two times a day (BID) | ORAL | 3 refills | Status: DC

## 2020-11-06 NOTE — Assessment & Plan Note (Signed)
Chronic problem stable on SSRI Re order Fluoxetine 20mg  [facility]

## 2020-11-06 NOTE — Assessment & Plan Note (Signed)
Chronic CAD Stable without worsening angina Continue ASA, BB, Statin Follow-up with [facility] as planned

## 2020-11-06 NOTE — Progress Notes (Signed)
Subjective:    Patient ID: Charles Ewing, male    DOB: Apr 17, 1940, 80 y.o.   MRN: [id number]  Charles Ewing is a 80 y.o. male presenting on 11/06/2020 for Annual Exam   HPI  Here for Annual Physical and Lab Review.  Pre-Diabetes Known Pre-Diabetes A1c 5.8 CBGs: checks at home, infrequently, but he has readings ranging mid 100s, avg 120-140, high of 200, no hypoglycemia or low sugars Meds: Metformin XR 529m daily Currently not on ACEi / ARB - prior elevated microalbumin Lifestyle: - Diet (Tries to improve diet) - Exercise (Limited - due to knee pain) - Fam history of DM Denies hypoglycemia, polyuria, visual changes, numbness or tingling.   Centrilobular Emphysema (COPD) Currently doing well. No new concern or recent flare up. He has maintenance therapy. Spiriva, Flovent Active smoker. Not ready to quit  HYPERLIPIDEMIA: - Reports no concerns. Last lipid panel 10/2020, controlled  - Currently taking Simvastatin, tolerating well without side effects or myalgias   Chronic Anxiety Associated with PTSD Currently controlled on Lorazepam 0.547mnightly for insomnia and anxiety Currently [facility]ID [facility] [place] [facility] until recently advised that they could no longer manage it and needed PCP to [facility]. Due for refill   CHRONIC HTN: Reports no new concerns. Home BP avg 130-140 Current Meds - Amlodipine 5mg3mily, Metoprolol XL 50mg76mly. Losartan 25mg 76mrts good compliance, took meds today. Tolerating well, w/o complaints. Denies CP, dyspnea, HA, edema, dizziness / lightheadedness  Elevated PSA Prior trend with PSA 5 to 7 over past 3 years. Previously followed by [facility] but no longer going to see them. He has known BPH on Tamsulosin previously but has come off of this as well. He has fam history of prostate cancer. He prefers to repeat the PSA lab, was offered return back to Urology. BPH, s/p TURP Dr StoiofBernardo Heater has had hematuria in past, also  UTI treated, and negative prostate biopsy x 3, had seen Dr SninskDiamantina [place]- Last lab PSA increased from 6.8 up to 8.29, elevated. He is not ready to go back to Urologist.  RIGHT Low Back Pain acute on chronic R Sciatica He has seen [facility] Has chronic low back pain, Right sided sciatica Pain is debilitating worse at night and with activity flare up He has apt upcoming early October 2022   Abdominal Aortic Aneurysm Last imaging Retroperitoneal US diaKoreater 3.3cm, 2017, next advised repeat in 3 years, now due for repeat. He is asymptomatic.    Depression screen PHQ 2/[facility]/26/2022 05/25/2019 08/26/2018  Decreased Interest 0 0 0  Down, Depressed, Hopeless 0 0 0  PHQ - 2 Score 0 0 0    Past Medical History:  Diagnosis Date   Abdominal aortic aneurysm (AAA) (HCC) [place]   [facility]   [place]    Arrhythmia    Arthritis    Colon polyp    COPD (chronic obstructive pulmonary disease) (HCC)  [place]    Depression    GERD (gastroesophageal reflux disease)    Hyperlipidemia    Hypertension    Skin cancer    Thrush    Past Surgical History:  Procedure Laterality Date   CATARACT EXTRACTION W/PHACO Left 05/09/2020   Procedure: CATARACT EXTRACTION PHACO AND INTRAOCULAR LENS PLACEMENT (IOC) LEFT DIABETIC 9.09 01:02.0;  Surgeon: PorfilBirder Robson [place];  Laterality: Left;   CATARACT EXTRACTION W/PHACO Right 05/23/2020  Procedure: CATARACT EXTRACTION PHACO AND INTRAOCULAR LENS PLACEMENT (IOC) RIGHT DIABETIC 8.13 01:00.3;  Surgeon: Birder Robson, MD;  Location: [place];  Service: Ophthalmology;  Laterality: Right;   Colon polyp surgery     SKIN CANCER EXCISION     TRANSURETHRAL RESECTION OF PROSTATE  2010   Social History   Socioeconomic History   Marital status: Divorced    Spouse name: Not on file   Number of children: Not on file   Years of education: Not on file   Highest education  level: Not on file  Occupational History   Not on file  Tobacco Use   Smoking status: Every Day    Packs/day: 1.00    Years: 70.00    Pack years: 70.00    Types: Cigarettes   Smokeless tobacco: Current  Substance and Sexual Activity   Alcohol use: No   Drug use: No   Sexual activity: Not on file  Other Topics Concern   Not on file  Social History Narrative   Not on file   Social Determinants of Health   Financial Resource Strain: Not on file  Food Insecurity: Not on file  Transportation Needs: Not on file  Physical Activity: Not on file  Stress: Not on file  Social Connections: Not on file  Intimate Partner Violence: Not on file   Family History  Problem Relation Age of Onset   Kidney cancer Mother    Prostate cancer Neg Hx    Bladder Cancer Neg Hx    Current Outpatient Medications on File Prior to Visit  Medication Sig   albuterol (VENTOLIN HFA) 108 (90 Base) MCG/ACT inhaler INHALE 2 PUFFS INTO THE LUNGS EVERY 6 HOURS AS NEEDED FOR WHEEZING OR SHORTNESS OF BREATH.   amLODipine (NORVASC) 5 MG tablet    aspirin EC 81 MG tablet Take 81 mg by mouth every evening.   baclofen (LIORESAL) 10 MG tablet Take 0.5-1 tablets (5-10 mg total) by mouth 3 (three) times daily as needed for muscle spasms.   Blood Glucose Monitoring Suppl (ONE TOUCH ULTRA SYSTEM KIT) w/Device KIT 1 kit by Does not apply route once. CHeck blood glucose in the AM three times weekly.   clotrimazole (MYCELEX) 10 MG troche    dicyclomine (BENTYL) 10 MG capsule dicyclomine 10 mg capsule   Elastic Bandages & Supports (MEDICAL COMPRESSION STOCKINGS) MISC 1 each by Does not apply route daily.   esomeprazole (NEXIUM) 40 MG capsule esomeprazole magnesium 40 mg capsule,delayed release   FLOVENT DISKUS 50 MCG/BLIST diskus inhaler INHALE 1 PUFF BY MOUTH INTO THE LUNGS 2 TIMES DAILY   fluticasone (FLONASE) 50 MCG/ACT nasal spray PLACE 2 SPRAYS INTO BOTH NOSTRILS DAILY   furosemide (LASIX) 20 MG tablet TAKE ONE-HALF TO  ONE TABLET BY MOUTH DAILY   glucose blood test strip Check blood glucose three times weekly.   hydrochlorothiazide (HYDRODIURIL) 25 MG tablet    Lancets (ONETOUCH ULTRASOFT) lancets Use as instructed   loratadine (CLARITIN) 10 MG tablet Take 10 mg by mouth every evening.   LORazepam (ATIVAN) 0.5 MG tablet TAKE 1 TABLET BY MOUTH AT BEDTIME AS NEEDED FOR ANXIETY   losartan (COZAAR) 25 MG tablet TAKE 1 TABLET BY MOUTH DAILY   meloxicam (MOBIC) 15 MG tablet meloxicam 15 mg tablet  TAKE 1 TABLET BY MOUTH DAILY WITH MEALS   metFORMIN (GLUCOPHAGE-XR) 500 MG 24 hr tablet TAKE 1 TABLET BY MOUTH DAILY WITH BREAKFAST   metoprolol succinate (TOPROL-XL) 50 MG 24 hr tablet Take 1 tablet (  50 mg total) by mouth daily. Take with or immediately following a meal.   mupirocin cream (BACTROBAN) 2 % Apply 1 application topically 2 (two) times daily. For 1-2 weeks, as needed   neomycin-polymyxin-hydrocortisone (CORTISPORIN) 3.5-10000-1 OTIC suspension    nystatin (MYCOSTATIN) 100000 UNIT/ML suspension TAKE 5 mls (1 TEASPOONFUL) BY MOUTH 4 TIMES DAILY.   pantoprazole (PROTONIX) 20 MG tablet Take 20 mg by mouth daily.   polyethylene glycol powder (GLYCOLAX/MIRALAX) 17 GM/SCOOP powder USE HALF UP TO 1 CAPFUL BY MOUTH DAILY FOR MAINTENANCE, IF ACUTE WORSENING CONSTIPATION CAN INCREASE TO 2 CAPFULS DAILY OR 1 CAPFUL TWICE DA   potassium chloride (MICRO-K) 10 MEQ CR capsule    simvastatin (ZOCOR) 20 MG tablet Take 20 mg by mouth at bedtime.   sucralfate (CARAFATE) 1 g tablet Take 1 tablet (1 g total) by mouth 4 (four) times daily -  with meals and at bedtime. As needed   tamsulosin (FLOMAX) 0.4 MG CAPS capsule Take 1 capsule (0.4 mg total) by mouth daily after breakfast.   tiotropium (SPIRIVA HANDIHALER) 18 MCG inhalation capsule Place 1 capsule (18 mcg total) into inhaler and inhale daily.   triamcinolone cream (KENALOG) 0.5 % Apply 1 application topically 2 (two) times daily. To affected areas, for up to 2 weeks.   No  current facility-administered medications on file prior to visit.    Review of Systems Per HPI unless specifically indicated above      Objective:    BP 109/62   Pulse 64   Ht '5\' 8"'  (1.727 m)   Wt 195 lb 9.6 oz (88.7 kg)   SpO2 96%   BMI 29.74 kg/m   Wt Readings from Last 3 Encounters:  11/06/20 195 lb 9.6 oz (88.7 kg)  09/04/20 220 lb 12.8 oz (100.2 kg)  05/23/20 219 lb (99.3 kg)    Physical Exam Vitals and nursing note reviewed.  Constitutional:      General: He is not in acute distress.    Appearance: He is well-developed. He is not diaphoretic.     Comments: Well-appearing, comfortable, cooperative  HENT:     Head: Normocephalic and atraumatic.  Eyes:     General:        Right eye: No discharge.        Left eye: No discharge.     Conjunctiva/sclera: Conjunctivae normal.     Pupils: Pupils are equal, round, and reactive to light.  Neck:     Thyroid: No thyromegaly.  Cardiovascular:     Rate and Rhythm: Normal rate and regular rhythm.     Pulses: Normal pulses.     Heart sounds: Normal heart sounds. No murmur heard. Pulmonary:     Effort: Pulmonary effort is normal. No respiratory distress.     Breath sounds: No wheezing or rales.     Comments: Stable chronic reduced air movement with lungs Abdominal:     General: Bowel sounds are normal. There is no distension.     Palpations: Abdomen is soft. There is no mass.     Tenderness: There is no abdominal tenderness.  Musculoskeletal:        General: No tenderness. Normal range of motion.     Cervical back: Normal range of motion and neck supple.     Comments: Upper / Lower Extremities: - Normal muscle tone, strength bilateral upper extremities 5/5, lower extremities 5/5  Lymphadenopathy:     Cervical: No cervical adenopathy.  Skin:    General: Skin is warm and dry.  Findings: No erythema or rash.  Neurological:     Mental Status: He is alert and oriented to person, place, and time.     Comments: Distal  sensation intact to light touch all extremities  Psychiatric:        Mood and Affect: Mood normal.        Behavior: Behavior normal.        Thought Content: Thought content normal.     Comments: Well groomed, good eye contact, normal speech and thoughts   Results for orders placed or performed in visit on 10/30/20  PSA  Result Value Ref Range   PSA 8.29 (H) < OR = 4.00 ng/mL  Lipid panel  Result Value Ref Range   Cholesterol 96 <200 mg/dL   HDL 40 > OR = 40 mg/dL   Triglycerides 87 <150 mg/dL   LDL Cholesterol (Calc) 39 mg/dL (calc)   Total CHOL/HDL Ratio 2.4 <5.0 (calc)   Non-HDL Cholesterol (Calc) 56 <130 mg/dL (calc)  HgB A1c  Result Value Ref Range   Hgb A1c MFr Bld 5.8 (H) <5.7 % of total Hgb   Mean Plasma Glucose 120 mg/dL   eAG (mmol/L) 6.6 mmol/L  CBC with Differential  Result Value Ref Range   WBC 8.3 3.8 - 10.8 Thousand/uL   RBC 5.18 4.20 - 5.80 Million/uL   Hemoglobin 16.1 13.2 - 17.1 g/dL   HCT 49.1 38.5 - 50.0 %   MCV 94.8 80.0 - 100.0 fL   MCH 31.1 27.0 - 33.0 pg   MCHC 32.8 32.0 - 36.0 g/dL   RDW 13.0 11.0 - 15.0 %   Platelets 188 140 - 400 Thousand/uL   MPV 10.6 7.5 - 12.5 fL   Neutro Abs 5,154 1,500 - 7,800 cells/uL   Lymphs Abs 2,374 850 - 3,900 cells/uL   Absolute Monocytes 506 200 - 950 cells/uL   Eosinophils Absolute 232 15 - 500 cells/uL   Basophils Absolute 33 0 - 200 cells/uL   Neutrophils Relative % 62.1 %   Total Lymphocyte 28.6 %   Monocytes Relative 6.1 %   Eosinophils Relative 2.8 %   Basophils Relative 0.4 %  Comprehensive Metabolic Panel (CMET)  Result Value Ref Range   Glucose, Bld 99 65 - 99 mg/dL   BUN 13 7 - 25 mg/dL   Creat 0.96 0.70 - 1.22 mg/dL   BUN/Creatinine Ratio NOT APPLICABLE 6 - 22 (calc)   Sodium 139 135 - 146 mmol/L   Potassium 4.2 3.5 - 5.3 mmol/L   Chloride 101 98 - 110 mmol/L   CO2 30 20 - 32 mmol/L   Calcium 8.6 8.6 - 10.3 mg/dL   Total Protein 5.8 (L) 6.1 - 8.1 g/dL   Albumin 3.8 3.6 - 5.1 g/dL   Globulin  2.0 1.9 - 3.7 g/dL (calc)   AG Ratio 1.9 1.0 - 2.5 (calc)   Total Bilirubin 0.7 0.2 - 1.2 mg/dL   Alkaline phosphatase (APISO) 51 35 - 144 U/L   AST 13 10 - 35 U/L   ALT 11 9 - 46 U/L      Assessment & Plan:   Problem List Items Addressed This Visit     PTSD (post-traumatic stress disorder)    Chronic problem stable on SSRI Re order Fluoxetine 79m BID [facility]      Relevant Medications   FLUoxetine (PROZAC) 20 MG capsule   Prediabetes    Improved A1c  Concern with obesity, HTN, HLD Fam history DM  Plan:  1. Continue Metformin XR 553m daily - future can hold or DC 2. 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, unspecified vessel or lesion type ([place]    Chronic CAD Stable without worsening angina Continue ASA, BB, Statin [facility]      Cent[place]obular emphysema ([place]    Stable COPD without flare On Flovent Spiriva maintenance  Will trial Breztri sample today, can hold other inhalers notify if helping we can order for him      Benign prostatic hyperplasia with urinary obstruction    Chronic BPH with some LUTS nocturia Previously followed by [facility] Has had elevated PSA 5-7 range Off tamsulosin now he declines to take and has not followed back up with Urology  Plan Elevated PSA now up to 8.29 advised we can repeat in 3 months approx or return to urology now      Benign hypertension with CKD (chronic kidney disease) stage III ([place]    Mostly controlled HTN Home BP reviewed Complication with orthostatic hypotension, CAD, AAA    Plan:  1. Continue current BP regimen - Amlodipine 573m Metoprolol XL 5057maily, Losartan 49m16mily 2. Encourage improved lifestyle - low sodium diet, regular exercise 3. Continue monitor BP outside office, bring  readings to next visit, if persistently >140/90 or new symptoms notify office sooner      Anxiety   Relevant Medications   FLUoxetine (PROZAC) 20 MG capsule   Abdominal aortic aneurysm (AAA) (HCC)[place] Previously stable AAA Last diam 3.3cm Followed by Cardiology On HTN meds Statin      Other Visit Diagnoses     Annual physical exam    -  Primary       Updated Health Maintenance information Reviewed recent lab results with patient Encouraged improvement to lifestyle with diet and exercise Goal of weight loss  Chronic Pain Will agree to trial Oxycodone 5mg 55mPRN only severe pain caution usage until can see Orthopedics. Future management TBD   Meds ordered this encounter  Medications   FLUoxetine (PROZAC) 20 MG capsule    Sig: Take 1 capsule (20 mg total) by mouth 2 (two) times daily.    Dispense:  180 capsule    Refill:  3    Please hold medication on file until patient is due.      Follow up plan: Return in about 6 weeks (around 12/18/2020) for 6 week follow-up ortho/back pain med refill.  [facility] 11/06/2020, 2:02 PM

## 2020-11-06 NOTE — Assessment & Plan Note (Signed)
Previously stable AAA Last diam 3.3cm Followed by Cardiology On HTN meds Statin

## 2020-11-06 NOTE — Assessment & Plan Note (Signed)
Improved A1c  Concern with obesity, HTN, HLD Fam history DM  Plan:  1. Continue Metformin XR 500mg  daily - future can hold or DC 2. Encourage improved lifestyle - low carb, low sugar diet, reduce portion size, continue improving regular exercise if able - On ARB

## 2020-11-06 NOTE — Patient Instructions (Addendum)
Thank you for coming to the office today.  Ordered Fluoxetine to pharmacy 20mg  twice a day new order fill when ready   Please schedule a Follow-up Appointment to: Return in about 6 weeks (around 12/18/2020) for 6 week follow-up ortho/back pain med refill.  If you have any other questions or concerns, please feel free to call the office or send a message through [place]. You may also schedule an earlier appointment if necessary.  Additionally, you may be receiving a survey about your experience at our office within a few days to 1 week by e-mail or mail. We value your feedback.  [name], DO [place]

## 2020-11-06 NOTE — Assessment & Plan Note (Signed)
Stable COPD without flare On Flovent Spiriva maintenance  Will trial Breztri sample today, can hold other inhalers notify if helping we can order for him

## 2020-11-06 NOTE — Assessment & Plan Note (Signed)
Chronic BPH with some LUTS nocturia Previously followed by [facility] Has had elevated PSA 5-7 range Off tamsulosin now he declines to take and has not followed back up with Urology  Plan Elevated PSA now up to 8.29 advised we can repeat in 3 months approx or return to urology now

## 2020-11-06 NOTE — Assessment & Plan Note (Signed)
Mostly controlled HTN Home BP reviewed Complication with orthostatic hypotension, CAD, AAA    Plan:  1. Continue current BP regimen - Amlodipine 5mg , Metoprolol XL 50mg  daily, Losartan 25mg  daily 2. Encourage improved lifestyle - low sodium diet, regular exercise 3. Continue monitor BP outside office, bring readings to next visit, if persistently >140/90 or new symptoms notify office sooner

## 2020-11-10 ENCOUNTER — Telehealth: Payer: Self-pay | Admitting: Family Medicine

## 2020-11-10 NOTE — Telephone Encounter (Signed)
Left message for patient to call back and schedule Medicare Annual Wellness Visit (AWV) to be done virtually or by telephone.  No hx of AWV eligible as of 02/11/09  Please schedule at anytime with [facility].      40 Minutes appointment   Any questions, please call me at [phone]

## 2020-11-17 DIAGNOSIS — M545 Low back pain, unspecified: Secondary | ICD-10-CM | POA: Diagnosis not present

## 2020-11-22 DIAGNOSIS — M545 Low back pain, unspecified: Secondary | ICD-10-CM | POA: Diagnosis not present

## 2020-11-24 DIAGNOSIS — M545 Low back pain, unspecified: Secondary | ICD-10-CM | POA: Diagnosis not present

## 2020-11-27 ENCOUNTER — Telehealth: Payer: Self-pay | Admitting: Family Medicine

## 2020-11-27 DIAGNOSIS — G8929 Other chronic pain: Secondary | ICD-10-CM

## 2020-11-27 MED ORDER — OXYCODONE HCL 5 MG PO TABS: 5.0000 mg | ORAL_TABLET | ORAL | 0 refills | Status: DC | PRN

## 2020-11-27 NOTE — Telephone Encounter (Signed)
Medication Refill - Medication:  oxyCODONE (OXY IR/ROXICODONE) 5 MG immediate release tablet   Has the patient contacted their pharmacy? Yes.   Contact pcp  Preferred Pharmacy (with phone number or street name):  [facility] [place], [address]  [place], [place] [address]  Phone:  [phone]  Fax:  [phone]  Has the patient been seen for an appointment in the last year OR does the patient have an upcoming appointment? Yes.    *Pts son called in stating pt has been in a lot of pain, and may have to get a shot on his back for a lump, and soon need surgery, but that wont happen until about week, and pt is hoping to get a least a 7 day refill to hold him until then, to get through the pain, please advise.*  Agent: Please be advised that RX refills may take up to 3 business days. We ask that you follow-up with your pharmacy.

## 2020-11-27 NOTE — Telephone Encounter (Signed)
His son is aware that the prescription has been sent and that we can only fill it this last time. He knows to follow up with ortho.

## 2020-11-27 NOTE — Telephone Encounter (Signed)
Please notify patient / son that I can order one more rx of the oxycodone pain medication. This would be the last rx I can offer for now. He would need to follow up with Orthopedics next for further pain management.  Rx sent to [facility].  If he needs further assistance with pain management, he can follow-up with me for that particular reason and we can focus more on it.   He may be good candidate to get Palliative Care involved for pain control and they can help [place] recommend treatment if he is interested, we could discuss referral at an office visit   [name], [facility] 11/27/2020, 12:50 PM

## 2020-11-30 ENCOUNTER — Other Ambulatory Visit: Payer: Self-pay | Admitting: Family Medicine

## 2020-11-30 DIAGNOSIS — R7303 Prediabetes: Secondary | ICD-10-CM

## 2020-11-30 DIAGNOSIS — F99 Mental disorder, not otherwise specified: Secondary | ICD-10-CM

## 2020-11-30 DIAGNOSIS — F5105 Insomnia due to other mental disorder: Secondary | ICD-10-CM

## 2020-11-30 DIAGNOSIS — F419 Anxiety disorder, unspecified: Secondary | ICD-10-CM

## 2020-11-30 NOTE — Telephone Encounter (Signed)
Requested medications are due for refill today yes  Requested medications are on the active medication list yes  Last refill 10/04/20 for Ativan, 08/28/20 for Gluc  Last visit 11/06/20  Future visit scheduled 11//2022  Notes to clinic Ativan not delegated, Metformin GFR  greater than 360 days ago, please assess.  Requested Prescriptions  Pending Prescriptions Disp Refills   LORazepam (ATIVAN) 0.5 MG tablet [Pharmacy Med Name: LORAZEPAM 0.5 MG TAB] 30 tablet     Sig: TAKE 1 TABLET BY MOUTH AT BEDTIME AS NEEDED FOR ANXIETY     Not Delegated - Psychiatry:  Anxiolytics/Hypnotics Failed - 11/30/2020 10:15 AM      Failed - This refill cannot be delegated      Failed - Urine Drug Screen completed in last 360 days      Passed - Valid encounter within last 6 months    Recent Outpatient Visits           3 weeks ago Annual physical exam   [facility] Olin Hauser, DO   2 months ago Bilateral lower extremity edema   [place], DO   7 months ago Intercostal muscle strain, initial encounter   [facility] [place], Devonne Doughty, DO   10 months ago [place] (actinic keratosis)   [facility] Olin Hauser, DO   1 year ago Acute cystitis without hematuria   [place], DO       Future Appointments             In 2 weeks [place], Devonne Doughty, DO [facility]             metFORMIN (GLUCOPHAGE-XR) 500 MG 24 hr tablet [Pharmacy Med Name: METFORMIN HCL ER 500 MG TAB] 90 tablet 0    Sig: TAKE 1 TABLET BY MOUTH DAILY WITH BREAKFAST     Endocrinology:  Diabetes - Biguanides Failed - 11/30/2020 10:15 AM      Failed - eGFR in normal range and within 360 days    GFR, Est African American  Date Value Ref Range Status  01/11/2019 64 > OR = 60 mL/min/1.69m Final   GFR, Est Non African American  Date Value Ref Range  Status  01/11/2019 55 (L) > OR = 60 mL/min/1.764mFinal          Passed - Cr in normal range and within 360 days    Creat  Date Value Ref Range Status  10/30/2020 0.96 0.70 - 1.22 mg/dL Final          Passed - HBA1C is between 0 and 7.9 and within 180 days    Hemoglobin A1C  Date Value Ref Range Status  02/05/2017 6.4  Final   Hgb A1c MFr Bld  Date Value Ref Range Status  10/30/2020 5.8 (H) <5.7 % of total Hgb Final    Comment:    For someone without known diabetes, a hemoglobin  A1c value between 5.7% and 6.4% is consistent with prediabetes and should be confirmed with a  follow-up test. . For someone with known diabetes, a value <7% indicates that their diabetes is well controlled. A1c targets should be individualized based on duration of diabetes, age, comorbid conditions, and other considerations. . This assay result is consistent with an increased risk of diabetes. . Currently, no consensus exists regarding use of hemoglobin A1c for diagnosis of diabetes for children. .Marland Kitchen  Passed - Valid encounter within last 6 months    Recent Outpatient Visits           3 weeks ago Annual physical exam   [place], DO   2 months ago Bilateral lower extremity edema   [place], DO   7 months ago Intercostal muscle strain, initial encounter   [place], DO   10 months ago [place] (actinic keratosis)   [place], DO   1 year ago Acute cystitis without hematuria   [place], DO       Future Appointments             In 2 weeks Parks Ranger, Devonne Doughty, DO [facility], [facility]

## 2020-12-08 ENCOUNTER — Other Ambulatory Visit: Payer: Self-pay | Admitting: Family Medicine

## 2020-12-08 DIAGNOSIS — M48062 Spinal stenosis, lumbar region with neurogenic claudication: Secondary | ICD-10-CM | POA: Diagnosis not present

## 2020-12-09 NOTE — Telephone Encounter (Signed)
Requested Prescriptions  Pending Prescriptions Disp Refills  . fluticasone (FLONASE) 50 MCG/ACT nasal spray [Pharmacy Med Name: FLUTICASONE PROPIONATE 50 MCG/ACT N] 16 g 3    Sig: PLACE 2 SPRAYS INTO BOTH NOSTRILS DAILY     Ear, Nose, and Throat: Nasal Preparations - Corticosteroids Passed - 12/08/2020 11:54 AM      Passed - Valid encounter within last 12 months    Recent Outpatient Visits          1 month ago Annual physical exam   [place], DO   3 months ago Bilateral lower extremity edema   [place], DO   7 months ago Intercostal muscle strain, initial encounter   [place], DO   10 months ago AK (actinic keratosis)   [place], DO   1 year ago Acute cystitis without hematuria   [place], DO      Future Appointments            In 1 week Parks Ranger, Devonne Doughty, DO [facility], [facility]

## 2020-12-14 ENCOUNTER — Other Ambulatory Visit: Payer: Self-pay

## 2020-12-14 ENCOUNTER — Encounter: Payer: Self-pay | Admitting: Internal Medicine

## 2020-12-14 ENCOUNTER — Ambulatory Visit (INDEPENDENT_AMBULATORY_CARE_PROVIDER_SITE_OTHER): Payer: Medicare Other | Admitting: Internal Medicine

## 2020-12-14 ENCOUNTER — Ambulatory Visit: Payer: Self-pay

## 2020-12-14 DIAGNOSIS — R0789 Other chest pain: Secondary | ICD-10-CM | POA: Diagnosis not present

## 2020-12-14 DIAGNOSIS — R519 Headache, unspecified: Secondary | ICD-10-CM

## 2020-12-14 DIAGNOSIS — J3489 Other specified disorders of nose and nasal sinuses: Secondary | ICD-10-CM | POA: Diagnosis not present

## 2020-12-14 DIAGNOSIS — J432 Centrilobular emphysema: Secondary | ICD-10-CM

## 2020-12-14 DIAGNOSIS — R051 Acute cough: Secondary | ICD-10-CM

## 2020-12-14 DIAGNOSIS — R0602 Shortness of breath: Secondary | ICD-10-CM

## 2020-12-14 LAB — POCT INFLUENZA A/B
Influenza A, POC: NEGATIVE
Influenza B, POC: NEGATIVE

## 2020-12-14 NOTE — Telephone Encounter (Signed)
Pt called in stating he has been feeling bad for 2 days with headache, cough, and chills. Denies having fever states he just feels like you do right when you get the flu. Pt stays at home and son goes out and gets groceries and meds and he became sick first and now pt has same symptoms. 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 Thought he was going to have trouble but hasn't yet. Says that he's been using cough drops for his cough with helps a lot right now. Advised that he could probably treat at home but scheduled a telephone visit today at 1600 with [facility]. Pt was very thankful for assistance and getting appt so quickly. Notified pt that this note would be sent to office. Care advice given and pt verbalized understanding.    Reason for Disposition  [1] Probable mild influenza (no fever) or a common cold, with no complications AND [1] NOT HIGH RISK  Answer Assessment - Initial Assessment Questions 1. WORST SYMPTOM: "What is your worst symptom?" (e.g., cough, runny nose, muscle aches, headache, sore throat, fever)      Cough headache 2. ONSET: "When did your flu symptoms start?"      2 days ago  3. COUGH: "How bad is the cough?"       Not to bad with cough drops  4. RESPIRATORY DISTRESS: "Describe your breathing."      Fairly ok with COPD 5. FEVER: "Do you have a fever?" If Yes, ask: "What is your temperature, how was it measured, and when did it start?"     Dont think so  6. EXPOSURE: "Were you exposed to someone with influenza?"       Unsure if Son was, he does the traveling  7. FLU VACCINE: "Did you get a flu shot this year?"     no 8. HIGH RISK DISEASE: "Do you have any chronic medical problems?" (e.g., heart or lung disease, asthma, weak immune system, or other HIGH RISK conditions)     COPD,  9. PREGNANCY: "Is there any chance you are pregnant?" "When was your last menstrual period?"     No 10. OTHER SYMPTOMS: "Do you have any other symptoms?"  (e.g., runny nose, muscle aches,  headache, sore throat)       Runny nose, chills  Protocols used: Influenza - [facility] [address]

## 2020-12-14 NOTE — Patient Instructions (Signed)
Influenza, Adult °Influenza is also called "the flu." It is an infection in the lungs, nose, and throat (respiratory tract). It spreads easily from person to person (is contagious). The flu causes symptoms that are like a cold, along with high fever and body aches. °What are the causes? °This condition is caused by the influenza virus. You can get the virus by: °Breathing in droplets that are in the air after a person infected with the flu coughed or sneezed. °Touching something that has the virus on it and then touching your mouth, nose, or eyes. °What increases the risk? °Certain things may make you more likely to get the flu. These include: °Not washing your hands often. °Having close contact with many people during cold and flu season. °Touching your mouth, eyes, or nose without first washing your hands. °Not getting a flu shot every year. °You may have a higher risk for the flu, and serious problems, such as a lung infection (pneumonia), if you: °Are older than 65. °Are pregnant. °Have a weakened disease-fighting system (immune system) because of a disease or because you are taking certain medicines. °Have a long-term (chronic) condition, such as: °Heart, kidney, or lung disease. °Diabetes. °Asthma. °Have a liver disorder. °Are very overweight (morbidly obese). °Have anemia. °What are the signs or symptoms? °Symptoms usually begin suddenly and last 4-14 days. They may include: °Fever and chills. °Headaches, body aches, or muscle aches. °Sore throat. °Cough. °Runny or stuffy (congested) nose. °Feeling discomfort in your chest. °Not wanting to eat as much as normal. °Feeling weak or tired. °Feeling dizzy. °Feeling sick to your stomach or throwing up. °How is this treated? °If the flu is found early, you can be treated with antiviral medicine. This can help to reduce how bad the illness is and how long it lasts. This may be given by mouth or through an IV tube. °Taking care of yourself at home can help your  symptoms get better. Your doctor may want you to: °Take over-the-counter medicines. °Drink plenty of fluids. °The flu often goes away on its own. If you have very bad symptoms or other problems, you may be treated in a hospital. °Follow these instructions at home: °  °Activity °Rest as needed. Get plenty of sleep. °Stay home from work or school as told by your doctor. °Do not leave home until you do not have a fever for 24 hours without taking medicine. °Leave home only to go to your doctor. °Eating and drinking °Take an ORS (oral rehydration solution). This is a drink that is sold at pharmacies and stores. °Drink enough fluid to keep your pee pale yellow. °Drink clear fluids in small amounts as you are able. Clear fluids include: °Water. °Ice chips. °Fruit juice mixed with water. °Low-calorie sports drinks. °Eat bland foods that are easy to digest. Eat small amounts as you are able. These foods include: °Bananas. °Applesauce. °Rice. °Lean meats. °Toast. °Crackers. °Do not eat or drink: °Fluids that have a lot of sugar or caffeine. °Alcohol. °Spicy or fatty foods. °General instructions °Take over-the-counter and prescription medicines only as told by your doctor. °Use a cool mist humidifier to add moisture to the air in your home. This can make it easier for you to breathe. °When using a cool mist humidifier, clean it daily. Empty water and replace with clean water. °Cover your mouth and nose when you cough or sneeze. °Wash your hands with soap and water often and for at least 20 seconds. This is also important after   you cough or sneeze. If you cannot use soap and water, use alcohol-based hand sanitizer. °Keep all follow-up visits. °How is this prevented? ° °Get a flu shot every year. You may get the flu shot in late summer, fall, or winter. Ask your doctor when you should get your flu shot. °Avoid contact with people who are sick during fall and winter. This is cold and flu season. °Contact a doctor if: °You get  new symptoms. °You have: °Chest pain. °Watery poop (diarrhea). °A fever. °Your cough gets worse. °You start to have more mucus. °You feel sick to your stomach. °You throw up. °Get help right away if you: °Have shortness of breath. °Have trouble breathing. °Have skin or nails that turn a bluish color. °Have very bad pain or stiffness in your neck. °Get a sudden headache. °Get sudden pain in your face or ear. °Cannot eat or drink without throwing up. °These symptoms may represent a serious problem that is an emergency. Get medical help right away. Call your local emergency services (911 in the [place] °Do not wait to see if the symptoms will go away. °Do not drive yourself to the hospital. °Summary °Influenza is also called "the flu." It is an infection in the lungs, nose, and throat. It spreads easily from person to person. °Take over-the-counter and prescription medicines only as told by your doctor. °Getting a flu shot every year is the best way to not get the flu. °This information is not intended to replace advice given to you by your health care provider. Make sure you discuss any questions you have with your health care provider. °Document Revised: 09/17/2019 Document Reviewed: 09/17/2019 °Elsevier Patient Education © 2022 [employer] Inc[employer] ° °

## 2020-12-14 NOTE — Addendum Note (Signed)
Addended by: Wilson Singer on: 12/14/2020 03:51 PM   Modules accepted: Orders

## 2020-12-14 NOTE — Progress Notes (Signed)
Virtual Visit via Telephone Note  I connected with Charles Ewing on 12/14/20 at  4:00 PM EDT by telephone and verified that I am speaking with the correct person using two identifiers.  Location: Patient: Home Provider: Office  Person's participating in this video call: [name], NP and [name].   I discussed the limitations, risks, security and privacy concerns of performing an evaluation and management service by telephone and the availability of in person appointments. I also discussed with the patient that there may be a patient responsible charge related to this service. The patient expressed understanding and agreed to proceed.   History of Present Illness:  Pt reports headaches, cough and chest pain. He reports this started 2 days ago. The headache is located in the left side of his forehead. He describes the pain as sharp but denies dizziness or visual changes. He reports runny nose, nasal congestion but denies ear pain or sore throat. He is blowing clear mucous out of his nose. He has chronic shortness of breath, but reports this is not worse than usual. He is unsure if he is running a fever, but has had chills and body aches. He has tried cough drops over the counter with minimal relief of symptoms. He has had sick contacts with similar symptoms.    Past Medical History:  Diagnosis Date   Abdominal aortic aneurysm (AAA) ([place]) 2013   found at [facility]-   Anxiety    Arrhythmia    Arthritis    Colon polyp    COPD (chronic obstructive pulmonary disease) (HCC)    Coronary artery disease    Depression    GERD (gastroesophageal reflux disease)    Hyperlipidemia    Hypertension    Skin cancer    Thrush     Current Outpatient Medications  Medication Sig Dispense Refill   albuterol (VENTOLIN HFA) 108 (90 Base) MCG/ACT inhaler INHALE 2 PUFFS INTO THE LUNGS EVERY 6 HOURS AS NEEDED FOR WHEEZING OR SHORTNESS OF BREATH. 8.5 g 3   amLODipine (NORVASC) 5 MG tablet      aspirin EC  81 MG tablet Take 81 mg by mouth every evening.     baclofen (LIORESAL) 10 MG tablet Take 0.5-1 tablets (5-10 mg total) by mouth 3 (three) times daily as needed for muscle spasms. 30 each 1   Blood Glucose Monitoring Suppl (ONE TOUCH ULTRA SYSTEM KIT) w/Device KIT 1 kit by Does not apply route once. CHeck blood glucose in the AM three times weekly. 1 each 0   clotrimazole (MYCELEX) 10 MG troche      dicyclomine (BENTYL) 10 MG capsule dicyclomine 10 mg capsule     Elastic Bandages & Supports (MEDICAL COMPRESSION STOCKINGS) MISC 1 each by Does not apply route daily. 1 each 0   esomeprazole (NEXIUM) 40 MG capsule esomeprazole magnesium 40 mg capsule,delayed release     FLOVENT DISKUS 50 MCG/BLIST diskus inhaler INHALE 1 PUFF BY MOUTH INTO THE LUNGS 2 TIMES DAILY 60 each 11   FLUoxetine (PROZAC) 20 MG capsule Take 1 capsule (20 mg total) by mouth 2 (two) times daily. 180 capsule 3   fluticasone (FLONASE) 50 MCG/ACT nasal spray PLACE 2 SPRAYS INTO BOTH NOSTRILS DAILY 16 g 3   furosemide (LASIX) 20 MG tablet TAKE ONE-HALF TO ONE TABLET BY MOUTH DAILY 30 tablet 2   glucose blood test strip Check blood glucose three times weekly. 100 each 12   hydrochlorothiazide (HYDRODIURIL) 25 MG tablet      Lancets (  ONETOUCH ULTRASOFT) lancets Use as instructed 100 each 12   loratadine (CLARITIN) 10 MG tablet Take 10 mg by mouth every evening.     LORazepam (ATIVAN) 0.5 MG tablet TAKE 1 TABLET BY MOUTH AT BEDTIME AS NEEDED FOR ANXIETY 30 tablet 0   losartan (COZAAR) 25 MG tablet TAKE 1 TABLET BY MOUTH DAILY 90 tablet 1   meloxicam (MOBIC) 15 MG tablet meloxicam 15 mg tablet  TAKE 1 TABLET BY MOUTH DAILY WITH MEALS     metFORMIN (GLUCOPHAGE-XR) 500 MG 24 hr tablet TAKE 1 TABLET BY MOUTH DAILY WITH BREAKFAST 90 tablet 0   metoprolol succinate (TOPROL-XL) 50 MG 24 hr tablet Take 1 tablet (50 mg total) by mouth daily. Take with or immediately following a meal. 30 tablet 11   mupirocin cream (BACTROBAN) 2 % Apply 1  application topically 2 (two) times daily. For 1-2 weeks, as needed 15 g 0   neomycin-polymyxin-hydrocortisone (CORTISPORIN) 3.5-10000-1 OTIC suspension      nystatin (MYCOSTATIN) 100000 UNIT/ML suspension TAKE 5 mls (1 TEASPOONFUL) BY MOUTH 4 TIMES DAILY. 473 mL 3   oxyCODONE (OXY IR/ROXICODONE) 5 MG immediate release tablet Take 1 tablet (5 mg total) by mouth every 4 (four) hours as needed for severe pain. 30 tablet 0   pantoprazole (PROTONIX) 20 MG tablet Take 20 mg by mouth daily.     polyethylene glycol powder (GLYCOLAX/MIRALAX) 17 GM/SCOOP powder USE HALF UP TO 1 CAPFUL BY MOUTH DAILY FOR MAINTENANCE, IF ACUTE WORSENING CONSTIPATION CAN INCREASE TO 2 CAPFULS DAILY OR 1 CAPFUL TWICE DA 255 g 3   potassium chloride (MICRO-K) 10 MEQ CR capsule      simvastatin (ZOCOR) 20 MG tablet Take 20 mg by mouth at bedtime.     sucralfate (CARAFATE) 1 g tablet Take 1 tablet (1 g total) by mouth 4 (four) times daily -  with meals and at bedtime. As needed 40 tablet 5   tamsulosin (FLOMAX) 0.4 MG CAPS capsule Take 1 capsule (0.4 mg total) by mouth daily after breakfast. 90 capsule 3   tiotropium (SPIRIVA HANDIHALER) 18 MCG inhalation capsule Place 1 capsule (18 mcg total) into inhaler and inhale daily. 30 capsule 11   triamcinolone cream (KENALOG) 0.5 % Apply 1 application topically 2 (two) times daily. To affected areas, for up to 2 weeks. 30 g 1   No current facility-administered medications for this visit.    No Known Allergies  Family History  Problem Relation Age of Onset   Kidney cancer Mother    Prostate cancer Neg Hx    Bladder Cancer Neg Hx     Social History   Socioeconomic History   Marital status: Divorced    Spouse name: Not on file   Number of children: Not on file   Years of education: Not on file   Highest education level: Not on file  Occupational History   Not on file  Tobacco Use   Smoking status: Every Day    Packs/day: 1.00    Years: 70.00    Pack years: 70.00     Types: Cigarettes   Smokeless tobacco: Current  Substance and Sexual Activity   Alcohol use: No   Drug use: No   Sexual activity: Not on file  Other Topics Concern   Not on file  Social History Narrative   Not on file   Social Determinants of Health   Financial Resource Strain: Not on file  Food Insecurity: Not on file  Transportation Needs: Not on file  Physical Activity: Not on file  Stress: Not on file  Social Connections: Not on file  Intimate Partner Violence: Not on file     Constitutional: Pt reports headache, chills and body aches. Denies fever, malaise, fatigue, or abrupt weight changes.  HEENT: Pt reports runny nose, nasal congestion. Denies eye pain, eye redness, ear pain, ringing in the ears, wax buildup, bloody nose, or sore throat. Respiratory: Pt reports cough and shortness of breath. Denies difficulty breathing, or sputum production.   Cardiovascular: Pt reports chest pain. Denies chest tightness, palpitations or swelling in the hands or feet.  Gastrointestinal: Denies abdominal pain, bloating, constipation, diarrhea or blood in the stool.   No other specific complaints in a complete review of systems (except as listed in HPI above).    Observations/Objective: There were no vitals taken for this visit. Wt Readings from Last 3 Encounters:  11/06/20 195 lb 9.6 oz (88.7 kg)  09/04/20 220 lb 12.8 oz (100.2 kg)  05/23/20 219 lb (99.3 kg)    HEENT: Nose: congestion noted; Throat/Mouth: hoarseness noted. Pulmonary/Chest: Normal effort. No respiratory distress.  Neurological: Alert and oriented.   BMET    Component Value Date/Time   NA 139 10/30/2020 0924   NA 138 05/19/2015 1151   NA 141 10/16/2013 0504   K 4.2 10/30/2020 0924   K 3.8 10/16/2013 0504   CL 101 10/30/2020 0924   CL 107 10/16/2013 0504   CO2 30 10/30/2020 0924   CO2 26 10/16/2013 0504   GLUCOSE 99 10/30/2020 0924   GLUCOSE 94 10/16/2013 0504   BUN 13 10/30/2020 0924   BUN 11 05/19/2015  1151   BUN 11 10/16/2013 0504   CREATININE 0.96 10/30/2020 0924   CALCIUM 8.6 10/30/2020 0924   CALCIUM 8.0 (L) 10/16/2013 0504   GFRNONAA 55 (L) 01/11/2019 0902   GFRAA 64 01/11/2019 0902    Lipid Panel     Component Value Date/Time   CHOL 96 10/30/2020 0924   TRIG 87 10/30/2020 0924   HDL 40 10/30/2020 0924   CHOLHDL 2.4 10/30/2020 0924   LDLCALC 39 10/30/2020 0924    CBC    Component Value Date/Time   WBC 8.3 10/30/2020 0924   RBC 5.18 10/30/2020 0924   HGB 16.1 10/30/2020 0924   HGB 14.6 05/19/2015 1151   HCT 49.1 10/30/2020 0924   HCT 44.5 05/19/2015 1151   PLT 188 10/30/2020 0924   PLT 231 05/19/2015 1151   MCV 94.8 10/30/2020 0924   MCV 93 05/19/2015 1151   MCV 94 10/16/2013 0504   MCH 31.1 10/30/2020 0924   MCHC 32.8 10/30/2020 0924   RDW 13.0 10/30/2020 0924   RDW 15.6 (H) 05/19/2015 1151   RDW 14.9 (H) 10/16/2013 0504   LYMPHSABS 2,374 10/30/2020 0924   LYMPHSABS 2.2 05/19/2015 1151   LYMPHSABS 2.1 10/16/2013 0504   MONOABS 630 11/17/2015 1632   MONOABS 0.6 10/16/2013 0504   EOSABS 232 10/30/2020 0924   EOSABS 0.1 05/19/2015 1151   EOSABS 0.2 10/16/2013 0504   BASOSABS 33 10/30/2020 0924   BASOSABS 0.0 05/19/2015 1151   BASOSABS 0.0 10/16/2013 0504    Hgb A1C Lab Results  Component Value Date   HGBA1C 5.8 (H) 10/30/2020        Assessment and Plan:  Headache, Runny Nose, Nasal Congestion, Cough, Shortness of Breath, Chest Pain:  Will have him drive up for [place] flu and [place]id test Encouraged rest and fluids Continue inhalers as prescribed Continue cough drops OTC as needed  Ok to take Zyrtec and Flonase OTC as needed for symptom management Will treat if either test is positive ER precautions discussed   Follow Up Instructions:    I discussed the assessment and treatment plan with the patient. The patient was provided an opportunity to ask questions and all were answered. The patient agreed with the plan and demonstrated an  understanding of the instructions.   The patient was advised to call back or seek an in-person evaluation if the symptoms worsen or if the condition fails to improve as anticipated.  I provided 9:37 minutes of non-face-to-face time during this encounter.   [name], NP

## 2020-12-15 LAB — SPECIMEN STATUS REPORT

## 2020-12-15 LAB — SARS-COV-2, NAA 2 DAY TAT

## 2020-12-15 LAB — NOVEL CORONAVIRUS, NAA: SARS-CoV-2, NAA: DETECTED — AB

## 2020-12-16 MED ORDER — HYDROCODONE BIT-HOMATROP MBR 5-1.5 MG/5ML PO SOLN: 5.0000 mL | Freq: Three times a day (TID) | ORAL | 0 refills | Status: DC | PRN

## 2020-12-16 MED ORDER — PREDNISONE 10 MG PO TABS: ORAL_TABLET | ORAL | 0 refills | Status: DC

## 2020-12-16 NOTE — Addendum Note (Signed)
Addended by: Jearld Fenton on: 12/16/2020 12:36 PM   Modules accepted: Orders

## 2020-12-18 ENCOUNTER — Other Ambulatory Visit: Payer: Self-pay | Admitting: Family Medicine

## 2020-12-18 ENCOUNTER — Ambulatory Visit: Payer: Medicare Other | Admitting: Family Medicine

## 2020-12-18 DIAGNOSIS — I10 Essential (primary) hypertension: Secondary | ICD-10-CM

## 2020-12-18 DIAGNOSIS — F419 Anxiety disorder, unspecified: Secondary | ICD-10-CM

## 2020-12-18 DIAGNOSIS — F5105 Insomnia due to other mental disorder: Secondary | ICD-10-CM

## 2020-12-18 DIAGNOSIS — F99 Mental disorder, not otherwise specified: Secondary | ICD-10-CM

## 2020-12-18 DIAGNOSIS — R809 Proteinuria, unspecified: Secondary | ICD-10-CM

## 2020-12-18 NOTE — Telephone Encounter (Signed)
Requested medications are due for refill today.  yes  Requested medications are on the active medications list.  yes  Last refill. 12/01/2020  Future visit scheduled.   no  Notes to clinic.  Medication not delegated.

## 2020-12-18 NOTE — Telephone Encounter (Signed)
Requested Prescriptions  Pending Prescriptions Disp Refills  . LORazepam (ATIVAN) 0.5 MG tablet [Pharmacy Med Name: LORAZEPAM 0.5 MG TAB] 30 tablet     Sig: TAKE 1 TABLET BY MOUTH AT BEDTIME AS NEEDED FOR ANXIETY     Not Delegated - Psychiatry:  Anxiolytics/Hypnotics Failed - 12/18/2020  2:16 PM      Failed - This refill cannot be delegated      Failed - Urine Drug Screen completed in last 360 days      Passed - Valid encounter within last 6 months    Recent Outpatient Visits          4 days ago Acute intractable headache, unspecified headache type   [place], Coralie Keens, NP   1 month ago Annual physical exam   [facility], Devonne Doughty, DO   3 months ago Bilateral lower extremity edema   [facility], Devonne Doughty, DO   8 months ago Intercostal muscle strain, initial encounter   [facility] Olin Hauser, [place]   11 months ago AK (actinic keratosis)   [facility], Devonne Doughty, DO             . losartan (COZAAR) 25 MG tablet [Pharmacy Med Name: LOSARTAN POTASSIUM 25 MG TAB] 90 tablet 1    Sig: TAKE 1 TABLET BY MOUTH DAILY     Cardiovascular:  Angiotensin Receptor Blockers Passed - 12/18/2020  2:16 PM      Passed - Cr in normal range and within 180 days    Creat  Date Value Ref Range Status  10/30/2020 0.96 0.70 - 1.22 mg/dL Final         Passed - K in normal range and within 180 days    Potassium  Date Value Ref Range Status  10/30/2020 4.2 3.5 - 5.3 mmol/L Final  10/16/2013 3.8 3.5 - 5.1 mmol/L Final         Passed - Patient is not pregnant      Passed - Last BP in normal range    BP Readings from Last 1 Encounters:  11/06/20 109/62         Passed - Valid encounter within last 6 months    Recent Outpatient Visits          4 days ago Acute intractable headache, unspecified headache type   [facility], Coralie Keens,  NP   1 month ago Annual physical exam   [facility] Olin Hauser, DO   3 months ago Bilateral lower extremity edema   [place], DO   8 months ago Intercostal muscle strain, initial encounter   [place], [place]   11 months ago AK (actinic keratosis)   [place], Devonne Doughty, DO

## 2020-12-22 ENCOUNTER — Other Ambulatory Visit: Payer: Self-pay

## 2020-12-22 ENCOUNTER — Inpatient Hospital Stay
Admission: EM | Admit: 2020-12-22 | Discharge: 2020-12-27 | DRG: 177 | Disposition: A | Discharge status: Home Health | Payer: Medicare Other | Attending: Internal Medicine | Admitting: Internal Medicine

## 2020-12-22 ENCOUNTER — Emergency Department: Payer: Medicare Other

## 2020-12-22 ENCOUNTER — Encounter (INDEPENDENT_AMBULATORY_CARE_PROVIDER_SITE_OTHER): Payer: Self-pay

## 2020-12-22 DIAGNOSIS — Z7982 Long term (current) use of aspirin: Secondary | ICD-10-CM | POA: Diagnosis not present

## 2020-12-22 DIAGNOSIS — J441 Chronic obstructive pulmonary disease with (acute) exacerbation: Secondary | ICD-10-CM | POA: Diagnosis not present

## 2020-12-22 DIAGNOSIS — F17200 Nicotine dependence, unspecified, uncomplicated: Secondary | ICD-10-CM | POA: Diagnosis not present

## 2020-12-22 DIAGNOSIS — N138 Other obstructive and reflux uropathy: Secondary | ICD-10-CM | POA: Diagnosis present

## 2020-12-22 DIAGNOSIS — I2510 Atherosclerotic heart disease of native coronary artery without angina pectoris: Secondary | ICD-10-CM | POA: Diagnosis present

## 2020-12-22 DIAGNOSIS — Z9842 Cataract extraction status, left eye: Secondary | ICD-10-CM | POA: Diagnosis not present

## 2020-12-22 DIAGNOSIS — R0602 Shortness of breath: Secondary | ICD-10-CM | POA: Diagnosis not present

## 2020-12-22 DIAGNOSIS — K219 Gastro-esophageal reflux disease without esophagitis: Secondary | ICD-10-CM | POA: Diagnosis not present

## 2020-12-22 DIAGNOSIS — U071 COVID-19: Principal | ICD-10-CM | POA: Diagnosis present

## 2020-12-22 DIAGNOSIS — F32A Depression, unspecified: Secondary | ICD-10-CM | POA: Diagnosis not present

## 2020-12-22 DIAGNOSIS — Z86010 Personal history of colonic polyps: Secondary | ICD-10-CM

## 2020-12-22 DIAGNOSIS — N401 Enlarged prostate with lower urinary tract symptoms: Secondary | ICD-10-CM | POA: Diagnosis present

## 2020-12-22 DIAGNOSIS — Z66 Do not resuscitate: Secondary | ICD-10-CM | POA: Diagnosis present

## 2020-12-22 DIAGNOSIS — Z961 Presence of intraocular lens: Secondary | ICD-10-CM | POA: Diagnosis present

## 2020-12-22 DIAGNOSIS — F419 Anxiety disorder, unspecified: Secondary | ICD-10-CM | POA: Diagnosis present

## 2020-12-22 DIAGNOSIS — Z79899 Other long term (current) drug therapy: Secondary | ICD-10-CM | POA: Diagnosis not present

## 2020-12-22 DIAGNOSIS — E785 Hyperlipidemia, unspecified: Secondary | ICD-10-CM | POA: Diagnosis not present

## 2020-12-22 DIAGNOSIS — N400 Enlarged prostate without lower urinary tract symptoms: Secondary | ICD-10-CM | POA: Diagnosis present

## 2020-12-22 DIAGNOSIS — R059 Cough, unspecified: Secondary | ICD-10-CM | POA: Diagnosis not present

## 2020-12-22 DIAGNOSIS — I10 Essential (primary) hypertension: Secondary | ICD-10-CM | POA: Diagnosis not present

## 2020-12-22 DIAGNOSIS — F17210 Nicotine dependence, cigarettes, uncomplicated: Secondary | ICD-10-CM | POA: Diagnosis present

## 2020-12-22 DIAGNOSIS — J9601 Acute respiratory failure with hypoxia: Secondary | ICD-10-CM | POA: Diagnosis present

## 2020-12-22 DIAGNOSIS — R0689 Other abnormalities of breathing: Secondary | ICD-10-CM | POA: Diagnosis not present

## 2020-12-22 DIAGNOSIS — I714 Abdominal aortic aneurysm, without rupture, unspecified: Secondary | ICD-10-CM | POA: Diagnosis present

## 2020-12-22 DIAGNOSIS — I429 Cardiomyopathy, unspecified: Secondary | ICD-10-CM | POA: Diagnosis not present

## 2020-12-22 DIAGNOSIS — Z9841 Cataract extraction status, right eye: Secondary | ICD-10-CM | POA: Diagnosis not present

## 2020-12-22 DIAGNOSIS — I959 Hypotension, unspecified: Secondary | ICD-10-CM | POA: Diagnosis present

## 2020-12-22 DIAGNOSIS — J432 Centrilobular emphysema: Secondary | ICD-10-CM | POA: Diagnosis present

## 2020-12-22 DIAGNOSIS — R9431 Abnormal electrocardiogram [ECG] [EKG]: Secondary | ICD-10-CM | POA: Diagnosis not present

## 2020-12-22 DIAGNOSIS — R0600 Dyspnea, unspecified: Secondary | ICD-10-CM | POA: Diagnosis not present

## 2020-12-22 DIAGNOSIS — I5021 Acute systolic (congestive) heart failure: Secondary | ICD-10-CM | POA: Diagnosis not present

## 2020-12-22 DIAGNOSIS — E119 Type 2 diabetes mellitus without complications: Secondary | ICD-10-CM | POA: Diagnosis present

## 2020-12-22 DIAGNOSIS — Z7984 Long term (current) use of oral hypoglycemic drugs: Secondary | ICD-10-CM

## 2020-12-22 DIAGNOSIS — Z743 Need for continuous supervision: Secondary | ICD-10-CM | POA: Diagnosis not present

## 2020-12-22 DIAGNOSIS — Z888 Allergy status to other drugs, medicaments and biological substances status: Secondary | ICD-10-CM

## 2020-12-22 LAB — BASIC METABOLIC PANEL
Anion gap: 7 (ref 5–15)
BUN: 16 mg/dL (ref 8–23)
CO2: 27 mmol/L (ref 22–32)
Calcium: 8.3 mg/dL — ABNORMAL LOW (ref 8.9–10.3)
Chloride: 97 mmol/L — ABNORMAL LOW (ref 98–111)
Creatinine, Ser: 0.74 mg/dL (ref 0.61–1.24)
GFR, Estimated: >60 mL/min (ref >60)
Glucose, Bld: 133 mg/dL — ABNORMAL HIGH (ref 70–99)
Potassium: 4.3 mmol/L (ref 3.5–5.1)
Sodium: 131 mmol/L — ABNORMAL LOW (ref 135–145)

## 2020-12-22 LAB — RESP PANEL BY RT-PCR (FLU A&B, COVID) ARPGX2
Influenza A by PCR: NEGATIVE
Influenza B by PCR: NEGATIVE
SARS Coronavirus 2 by RT PCR: POSITIVE — AB

## 2020-12-22 LAB — CBC
HCT: 43.5 % (ref 39.0–52.0)
Hemoglobin: 14.7 g/dL (ref 13.0–17.0)
MCH: 31.1 pg (ref 26.0–34.0)
MCHC: 33.8 g/dL (ref 30.0–36.0)
MCV: 92 fL (ref 80.0–100.0)
Platelets: 211 10*3/uL (ref 150–400)
RBC: 4.73 MIL/uL (ref 4.22–5.81)
RDW: 13.5 % (ref 11.5–15.5)
WBC: 9.6 10*3/uL (ref 4.0–10.5)
nRBC: 0 % (ref 0.0–0.2)

## 2020-12-22 LAB — TROPONIN I (HIGH SENSITIVITY): Troponin I (High Sensitivity): 5 ng/L (ref <18)

## 2020-12-22 LAB — GLUCOSE, CAPILLARY
Glucose-Capillary: 164 mg/dL — ABNORMAL HIGH (ref 70–99)
Glucose-Capillary: 230 mg/dL — ABNORMAL HIGH (ref 70–99)

## 2020-12-22 MED ORDER — ASPIRIN EC 81 MG PO TBEC
81.0000 mg | DELAYED_RELEASE_TABLET | Freq: Every evening | ORAL | Status: DC
  Administered 2020-12-22 – 2020-12-27 (×6): 81 mg via ORAL
  Filled 2020-12-22 (×5): qty 1

## 2020-12-22 MED ORDER — SUCRALFATE 1 G PO TABS
1.0000 g | ORAL_TABLET | Freq: Three times a day (TID) | ORAL | Status: DC
  Administered 2020-12-22 – 2020-12-27 (×20): 1 g via ORAL
  Filled 2020-12-22 (×20): qty 1

## 2020-12-22 MED ORDER — LOSARTAN POTASSIUM 50 MG PO TABS: 25.0000 mg | ORAL_TABLET | Freq: Every day | ORAL | Status: DC

## 2020-12-22 MED ORDER — METHYLPREDNISOLONE SODIUM SUCC 125 MG IJ SOLR
80.0000 mg | Freq: Every day | INTRAMUSCULAR | Status: AC
  Administered 2020-12-22: 80 mg via INTRAVENOUS
  Filled 2020-12-22: qty 2

## 2020-12-22 MED ORDER — TAMSULOSIN HCL 0.4 MG PO CAPS
0.4000 mg | ORAL_CAPSULE | Freq: Every day | ORAL | Status: DC
  Administered 2020-12-23 – 2020-12-27 (×5): 0.4 mg via ORAL
  Filled 2020-12-22 (×5): qty 1

## 2020-12-22 MED ORDER — SODIUM CHLORIDE 0.9 % IV SOLN
200.0000 mg | Freq: Once | INTRAVENOUS | Status: AC
  Administered 2020-12-22: 200 mg via INTRAVENOUS
  Filled 2020-12-22: qty 200

## 2020-12-22 MED ORDER — SIMVASTATIN 20 MG PO TABS
20.0000 mg | ORAL_TABLET | Freq: Every day | ORAL | Status: DC
  Administered 2020-12-22 – 2020-12-26 (×5): 20 mg via ORAL
  Filled 2020-12-22 (×5): qty 1

## 2020-12-22 MED ORDER — FUROSEMIDE 40 MG PO TABS: 20.0000 mg | ORAL_TABLET | Freq: Every day | ORAL | Status: DC

## 2020-12-22 MED ORDER — PREDNISONE 20 MG PO TABS
40.0000 mg | ORAL_TABLET | Freq: Every day | ORAL | Status: AC
  Administered 2020-12-23 – 2020-12-26 (×4): 40 mg via ORAL
  Filled 2020-12-22 (×4): qty 2

## 2020-12-22 MED ORDER — ZINC SULFATE 220 (50 ZN) MG PO CAPS
220.0000 mg | ORAL_CAPSULE | Freq: Every day | ORAL | Status: DC
  Administered 2020-12-22 – 2020-12-27 (×6): 220 mg via ORAL
  Filled 2020-12-22 (×6): qty 1

## 2020-12-22 MED ORDER — ONDANSETRON HCL 4 MG PO TABS: 4.0000 mg | ORAL_TABLET | Freq: Four times a day (QID) | ORAL | Status: DC | PRN

## 2020-12-22 MED ORDER — SODIUM CHLORIDE 0.9 % IV SOLN
1.0000 g | INTRAVENOUS | Status: AC
  Administered 2020-12-22 – 2020-12-26 (×5): 1 g via INTRAVENOUS
  Filled 2020-12-22 (×2): qty 10
  Filled 2020-12-22 (×3): qty 1

## 2020-12-22 MED ORDER — ALBUTEROL SULFATE HFA 108 (90 BASE) MCG/ACT IN AERS
2.0000 | INHALATION_SPRAY | Freq: Four times a day (QID) | RESPIRATORY_TRACT | Status: DC
  Administered 2020-12-22 – 2020-12-27 (×19): 2 via RESPIRATORY_TRACT
  Filled 2020-12-22: qty 6.7

## 2020-12-22 MED ORDER — GUAIFENESIN 100 MG/5ML PO LIQD
5.0000 mL | ORAL | Status: DC | PRN
  Administered 2020-12-22 – 2020-12-26 (×4): 5 mL via ORAL
  Filled 2020-12-22: qty 10
  Filled 2020-12-22: qty 5
  Filled 2020-12-22 (×3): qty 10

## 2020-12-22 MED ORDER — IPRATROPIUM-ALBUTEROL 0.5-2.5 (3) MG/3ML IN SOLN: 3.0000 mL | Freq: Four times a day (QID) | RESPIRATORY_TRACT | Status: DC

## 2020-12-22 MED ORDER — AMLODIPINE BESYLATE 5 MG PO TABS: 5.0000 mg | ORAL_TABLET | Freq: Every day | ORAL | Status: DC

## 2020-12-22 MED ORDER — ACETAMINOPHEN 325 MG PO TABS
650.0000 mg | ORAL_TABLET | Freq: Four times a day (QID) | ORAL | Status: DC | PRN
  Administered 2020-12-22 – 2020-12-23 (×2): 650 mg via ORAL
  Filled 2020-12-22 (×2): qty 2

## 2020-12-22 MED ORDER — IPRATROPIUM-ALBUTEROL 0.5-2.5 (3) MG/3ML IN SOLN
9.0000 mL | Freq: Once | RESPIRATORY_TRACT | Status: AC
  Administered 2020-12-22: 9 mL via RESPIRATORY_TRACT
  Filled 2020-12-22: qty 9

## 2020-12-22 MED ORDER — ACETAMINOPHEN 650 MG RE SUPP: 650.0000 mg | Freq: Four times a day (QID) | RECTAL | Status: DC | PRN

## 2020-12-22 MED ORDER — INSULIN ASPART 100 UNIT/ML IJ SOLN
0.0000 [IU] | Freq: Three times a day (TID) | INTRAMUSCULAR | Status: DC
  Administered 2020-12-22: 3 [IU] via SUBCUTANEOUS
  Administered 2020-12-23 – 2020-12-24 (×3): 2 [IU] via SUBCUTANEOUS
  Administered 2020-12-25: 17:00:00 3 [IU] via SUBCUTANEOUS
  Administered 2020-12-26: 2 [IU] via SUBCUTANEOUS
  Administered 2020-12-26: 3 [IU] via SUBCUTANEOUS
  Administered 2020-12-27: 2 [IU] via SUBCUTANEOUS
  Filled 2020-12-22 (×8): qty 1

## 2020-12-22 MED ORDER — INSULIN DETEMIR 100 UNIT/ML ~~LOC~~ SOLN
0.0750 [IU]/kg | Freq: Two times a day (BID) | SUBCUTANEOUS | Status: DC
  Administered 2020-12-22 – 2020-12-23 (×3): 7 [IU] via SUBCUTANEOUS
  Filled 2020-12-22 (×5): qty 0.07

## 2020-12-22 MED ORDER — INSULIN ASPART 100 UNIT/ML IJ SOLN: 0.0000 [IU] | Freq: Three times a day (TID) | INTRAMUSCULAR | Status: DC

## 2020-12-22 MED ORDER — METHYLPREDNISOLONE SODIUM SUCC 125 MG IJ SOLR
125.0000 mg | Freq: Once | INTRAMUSCULAR | Status: AC
  Administered 2020-12-22: 125 mg via INTRAVENOUS
  Filled 2020-12-22: qty 2

## 2020-12-22 MED ORDER — METOPROLOL SUCCINATE ER 50 MG PO TB24: 100.0000 mg | ORAL_TABLET | Freq: Every day | ORAL | Status: DC

## 2020-12-22 MED ORDER — FLUOXETINE HCL 20 MG PO CAPS
20.0000 mg | ORAL_CAPSULE | Freq: Two times a day (BID) | ORAL | Status: DC
  Administered 2020-12-22 – 2020-12-27 (×10): 20 mg via ORAL
  Filled 2020-12-22 (×11): qty 1

## 2020-12-22 MED ORDER — ADULT MULTIVITAMIN W/MINERALS CH
1.0000 | ORAL_TABLET | Freq: Every day | ORAL | Status: DC
  Administered 2020-12-22 – 2020-12-27 (×6): 1 via ORAL
  Filled 2020-12-22 (×6): qty 1

## 2020-12-22 MED ORDER — LORATADINE 10 MG PO TABS
10.0000 mg | ORAL_TABLET | Freq: Every evening | ORAL | Status: DC
  Administered 2020-12-22 – 2020-12-27 (×6): 10 mg via ORAL
  Filled 2020-12-22 (×6): qty 1

## 2020-12-22 MED ORDER — ENOXAPARIN SODIUM 40 MG/0.4ML IJ SOSY
40.0000 mg | PREFILLED_SYRINGE | INTRAMUSCULAR | Status: DC
  Administered 2020-12-22 – 2020-12-26 (×5): 40 mg via SUBCUTANEOUS
  Filled 2020-12-22 (×5): qty 0.4

## 2020-12-22 MED ORDER — ONDANSETRON HCL 4 MG/2ML IJ SOLN: 4.0000 mg | Freq: Four times a day (QID) | INTRAMUSCULAR | Status: DC | PRN

## 2020-12-22 MED ORDER — LORAZEPAM 0.5 MG PO TABS
0.5000 mg | ORAL_TABLET | Freq: Every day | ORAL | Status: DC
  Administered 2020-12-22 – 2020-12-26 (×5): 0.5 mg via ORAL
  Filled 2020-12-22 (×5): qty 1

## 2020-12-22 MED ORDER — ALBUTEROL SULFATE (2.5 MG/3ML) 0.083% IN NEBU: 2.5000 mg | INHALATION_SOLUTION | RESPIRATORY_TRACT | Status: DC | PRN

## 2020-12-22 MED ORDER — PANTOPRAZOLE SODIUM 40 MG PO TBEC
40.0000 mg | DELAYED_RELEASE_TABLET | Freq: Every day | ORAL | Status: DC
  Administered 2020-12-22 – 2020-12-27 (×6): 40 mg via ORAL
  Filled 2020-12-22 (×6): qty 1

## 2020-12-22 MED ORDER — SODIUM CHLORIDE 0.9 % IV SOLN
100.0000 mg | Freq: Every day | INTRAVENOUS | Status: AC
  Administered 2020-12-23 – 2020-12-26 (×4): 100 mg via INTRAVENOUS
  Filled 2020-12-22 (×2): qty 100
  Filled 2020-12-22: qty 20
  Filled 2020-12-22: qty 100

## 2020-12-22 MED ORDER — LACTATED RINGERS IV BOLUS
500.0000 mL | Freq: Once | INTRAVENOUS | Status: AC
  Administered 2020-12-22: 500 mL via INTRAVENOUS

## 2020-12-22 NOTE — ED Notes (Signed)
Crystal RN aware of assigned bed

## 2020-12-22 NOTE — ED Notes (Addendum)
First encounter with pt who was found to be in room not placed on monitor and also had Badger laying on his chest.   Pt presents to ED with "not feeling well for 4 months". Pt states he has no help at home and is finding it difficult to take care of himself. Pt states it is becoming hard to prepare meals and do basic everyday ADL's. Pt states som SOB but nothing out of the ordinary, pt states HX of COPD.   Pt does have auditory wheezes throughout, pt denies any new swelling at this time and is needing more oxygen requirements than normal. Pt denies having to wear O2 chronically.

## 2020-12-22 NOTE — ED Provider Notes (Signed)
[facility] Emergency Department Provider Note   ____________________________________________   Event Date/Time   First MD Initiated Contact with Patient 12/22/20 1216     (approximate)  I have reviewed the triage vital signs and the nursing notes.   HISTORY  Chief Complaint Shortness of Breath    HPI Charles Ewing is a 80 y.o. male who presents for worsening shortness of breath  LOCATION: Chest DURATION: 10 days prior to arrival TIMING: Worsening since onset SEVERITY: Severe QUALITY: Shortness of breath CONTEXT: 10 days prior to arrival patient was diagnosed with COVID and was told to continue using his albuterol inhaler/nebulizer/COPD medications but was not prescribed any antiviral medicine.  Patient initially improved and his respiratory symptoms however they started to worsen over the last 2 days MODIFYING FACTORS: Any exertion worsens this shortness of breath and its partially relieved with submental oxygen ASSOCIATED SYMPTOMS: Audible wheezing and chest tightness   Per medical record review patient has history of CHF and COPD          Past Medical History:  Diagnosis Date   Abdominal aortic aneurysm (AAA) 2013   found at [facility]-   Anxiety    Arrhythmia    Arthritis    Colon polyp    COPD (chronic obstructive pulmonary disease) ([place])    Coronary artery disease    Depression    GERD (gastroesophageal reflux disease)    Hyperlipidemia    Hypertension    Skin cancer    Thrush     Patient Active Problem List   Diagnosis Date Noted   PTSD (post-traumatic stress disorder) 11/06/2020   Venous insufficiency of both lower extremities 09/04/2020   Bilateral lower extremity edema 09/04/2020   Irregular heart rhythm 08/01/2016   Slow transit constipation 04/17/2016   Anxiety 04/17/2016   Insomnia 04/17/2016   Osteoarthritis of knees, 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   SOB (shortness of breath) on exertion 12/13/2015   Left sided chest pain  12/11/2015   Coronary artery disease involving native heart with other form of angina pectoris, unspecified vessel or lesion type ([place])    Benign hypertension with CKD (chronic kidney disease) stage III ([place]) 11/17/2015   Abdominal aortic aneurysm (AAA) 06/12/2015   Prediabetes 05/15/2015   Hyperlipidemia 05/15/2015   Centrilobular emphysema ([place]) 05/15/2015   Orthostatic hypotension 02/28/2015   Elevated prostate specific antigen (PSA) 04/14/2012   Benign prostatic hyperplasia with urinary obstruction 04/14/2012   False passage of urethra 04/14/2012    Past Surgical History:  Procedure Laterality Date   CATARACT EXTRACTION W/PHACO Left 05/09/2020   Procedure: CATARACT EXTRACTION PHACO AND INTRAOCULAR LENS PLACEMENT (IOC) LEFT DIABETIC 9.09 01:02.0;  Surgeon: Birder Robson, MD;  Location: [place];  Service: Ophthalmology;  Laterality: Left;   CATARACT EXTRACTION W/PHACO Right 05/23/2020   Procedure: CATARACT EXTRACTION PHACO AND INTRAOCULAR LENS PLACEMENT (IOC) RIGHT DIABETIC 8.13 01:00.3;  Surgeon: Birder Robson, MD;  Location: [place];  Service: Ophthalmology;  Laterality: Right;   Colon polyp surgery     SKIN CANCER EXCISION     TRANSURETHRAL RESECTION OF PROSTATE  2010    Prior to Admission medications   Medication Sig Start Date End Date Taking? Authorizing Provider  albuterol (VENTOLIN HFA) 108 (90 Base) MCG/ACT inhaler INHALE 2 PUFFS INTO THE LUNGS EVERY 6 HOURS AS NEEDED FOR WHEEZING OR SHORTNESS OF BREATH. 09/23/19   Parks Ranger, Devonne Doughty, DO  amLODipine (NORVASC) 5 MG tablet  05/16/17   [provider]  aspirin EC 81 MG tablet  Take 81 mg by mouth every evening.    [provider]  baclofen (LIORESAL) 10 MG tablet Take 0.5-1 tablets (5-10 mg total) by mouth 3 (three) times daily as needed for muscle spasms. 04/14/20   Karamalegos, Devonne Doughty, DO  Blood Glucose Monitoring Suppl (ONE TOUCH ULTRA SYSTEM KIT) w/Device KIT 1 kit by Does  not apply route once. CHeck blood glucose in the AM three times weekly. 05/15/15   Luciana Axe, NP  clotrimazole (MYCELEX) 10 MG troche  10/27/19   [provider]  dicyclomine (BENTYL) 10 MG capsule dicyclomine 10 mg capsule    [provider]  Elastic Bandages & Supports (MEDICAL COMPRESSION STOCKINGS) [place] 1 each by Does not apply route daily. 06/12/15   Krebs, Genevie Cheshire, NP  esomeprazole (NEXIUM) 40 MG capsule esomeprazole magnesium 40 mg capsule,delayed release    [provider]  FLOVENT DISKUS 50 MCG/BLIST diskus inhaler INHALE 1 PUFF BY MOUTH INTO THE LUNGS 2 TIMES DAILY 09/21/18   Parks Ranger, Devonne Doughty, DO  FLUoxetine (PROZAC) 20 MG capsule Take 1 capsule (20 mg total) by mouth 2 (two) times daily. 11/06/20   Karamalegos, Devonne Doughty, DO  fluticasone (FLONASE) 50 MCG/ACT nasal spray PLACE 2 SPRAYS INTO BOTH NOSTRILS DAILY 12/09/20   Parks Ranger, Devonne Doughty, DO  furosemide (LASIX) 20 MG tablet TAKE ONE-HALF TO ONE TABLET BY MOUTH DAILY 10/23/20   Parks Ranger, Devonne Doughty, DO  glucose blood test strip Check blood glucose three times weekly. 02/07/17   Karamalegos, Devonne Doughty, DO  hydrochlorothiazide (HYDRODIURIL) 25 MG tablet  07/09/18   [provider]  HYDROcodone bit-homatropine (HYCODAN) 5-1.5 MG/5ML syrup Take 5 mLs by mouth every 8 (eight) hours as needed for cough. 12/16/20   Jearld Fenton, NP  Lancets [facility] ULTRASOFT) lancets Use as instructed 02/07/17   Olin Hauser, DO  loratadine (CLARITIN) 10 MG tablet Take 10 mg by mouth every evening.    [provider]  LORazepam (ATIVAN) 0.5 MG tablet TAKE 1 TABLET BY MOUTH AT BEDTIME AS NEEDED FOR ANXIETY 12/19/20   Parks Ranger, Devonne Doughty, DO  losartan (COZAAR) 25 MG tablet TAKE 1 TABLET BY MOUTH DAILY 12/18/20   Karamalegos, Devonne Doughty, DO  meloxicam (MOBIC) 15 MG tablet meloxicam 15 mg tablet  TAKE 1 TABLET BY MOUTH DAILY WITH MEALS    [provider]  metFORMIN  (GLUCOPHAGE-XR) 500 MG 24 hr tablet TAKE 1 TABLET BY MOUTH DAILY WITH BREAKFAST 12/01/20   Karamalegos, Devonne Doughty, DO  metoprolol succinate (TOPROL-XL) 50 MG 24 hr tablet Take 1 tablet (50 mg total) by mouth daily. Take with or immediately following a meal. 11/17/15   Krebs, Genevie Cheshire, NP  mupirocin cream (BACTROBAN) 2 % Apply 1 application topically 2 (two) times daily. For 1-2 weeks, as needed 01/21/20   Olin Hauser, DO  neomycin-polymyxin-hydrocortisone (CORTISPORIN) 3.5-10000-1 OTIC suspension  05/12/17   [provider]  nystatin (MYCOSTATIN) 100000 UNIT/ML suspension TAKE 5 mls (1 TEASPOONFUL) BY MOUTH 4 TIMES DAILY. 09/03/18   Karamalegos, Devonne Doughty, DO  oxyCODONE (OXY IR/ROXICODONE) 5 MG immediate release tablet Take 1 tablet (5 mg total) by mouth every 4 (four) hours as needed for severe pain. 11/27/20   Karamalegos, Devonne Doughty, DO  pantoprazole (PROTONIX) 20 MG tablet Take 20 mg by mouth daily. 01/10/20   [provider]  polyethylene glycol powder (GLYCOLAX/MIRALAX) 17 GM/SCOOP powder USE HALF UP TO 1 CAPFUL BY MOUTH DAILY FOR MAINTENANCE, IF ACUTE WORSENING CONSTIPATION CAN INCREASE TO 2 CAPFULS  DAILY OR 1 CAPFUL TWICE DA 09/08/18   Karamalegos, Devonne Doughty, DO  potassium chloride (MICRO-K) 10 MEQ CR capsule  07/16/18   [provider]  predniSONE (DELTASONE) 10 MG tablet Take 6 tabs on day 1, 5 tabs on day 2, 4 tabs on day 3, 3 tabs on day 4, 2 tabs on day 5, 1 tab on day 6 12/16/20   Jearld Fenton, NP  simvastatin (ZOCOR) 20 MG tablet Take 20 mg by mouth at bedtime.    [provider]  sucralfate (CARAFATE) 1 g tablet Take 1 tablet (1 g total) by mouth 4 (four) times daily -  with meals and at bedtime. As needed 04/14/20   Olin Hauser, DO  tamsulosin (FLOMAX) 0.4 MG CAPS capsule Take 1 capsule (0.4 mg total) by mouth daily after breakfast. 04/14/20   Parks Ranger, Devonne Doughty, DO  tiotropium (SPIRIVA HANDIHALER) 18 MCG inhalation  capsule Place 1 capsule (18 mcg total) into inhaler and inhale daily. 11/04/17   Karamalegos, Devonne Doughty, DO  triamcinolone cream (KENALOG) 0.5 % Apply 1 application topically 2 (two) times daily. To affected areas, for up to 2 weeks. 08/26/18   Olin Hauser, DO    Allergies Patient has no known allergies.  Family History  Problem Relation Age of Onset   Kidney cancer Mother    Prostate cancer Neg Hx    Bladder Cancer Neg Hx     Social History Social History   Tobacco Use   Smoking status: Every Day    Packs/day: 1.00    Years: 70.00    Pack years: 70.00    Types: Cigarettes   Smokeless tobacco: Current  Substance Use Topics   Alcohol use: No   Drug use: No    Review of Systems Constitutional: No fever/chills Eyes: No visual changes. ENT: No sore throat. Cardiovascular: Endorses chest pain. Respiratory: Endorses shortness of breath. Gastrointestinal: No abdominal pain.  No nausea, no vomiting.  No diarrhea. Genitourinary: Negative for dysuria. Musculoskeletal: Negative for acute arthralgias Skin: Negative for rash. Neurological: Negative for headaches, weakness/numbness/paresthesias in any extremity Psychiatric: Negative for suicidal ideation/homicidal ideation   ____________________________________________   PHYSICAL EXAM:  VITAL SIGNS: ED Triage Vitals  Enc Vitals Group     BP 12/22/20 1203 (!) 99/54     Pulse Rate 12/22/20 1200 100     Resp 12/22/20 1157 (!) 24     Temp 12/22/20 1157 98.4 F (36.9 C)     Temp Source 12/22/20 1157 Oral     SpO2 12/22/20 1200 93 %     Weight 12/22/20 1159 220 lb (99.8 kg)     Height 12/22/20 1159 6' (1.829 m)     Head Circumference --      Peak Flow --      Pain Score 12/22/20 1159 0     Pain Loc --      Pain Edu? --      Excl. in [place]? --    Constitutional: Alert and oriented. Well appearing and in no acute distress. Eyes: Conjunctivae are normal. PERRL. Head: Atraumatic. Nose: No  congestion/rhinnorhea. Mouth/Throat: Mucous membranes are moist. Neck: No stridor Cardiovascular: Grossly normal heart sounds.  Good peripheral circulation. Respiratory: Increased respiratory effort.  Inspiratory and expiratory wheezes over all lung fields.  No retractions. Gastrointestinal: Soft and nontender. No distention. Musculoskeletal: No obvious deformities Neurologic:  Normal speech and language. No gross focal neurologic deficits are appreciated. Skin:  Skin is warm and dry. No rash noted.  Psychiatric: Mood and affect are normal. Speech and behavior are normal.  ____________________________________________   LABS (all labs ordered are listed, but only abnormal results are displayed)  Labs Reviewed  BASIC METABOLIC PANEL - Abnormal; Notable for the following components:      Result Value   Sodium 131 (*)    Chloride 97 (*)    Glucose, Bld 133 (*)    Calcium 8.3 (*)    All other components within normal limits  RESP PANEL BY RT-PCR (FLU A&B, COVID) ARPGX2  CBC  TROPONIN I (HIGH SENSITIVITY)   ____________________________________________  EKG  ED ECG REPORT I, Naaman Plummer, the attending physician, personally viewed and interpreted this ECG.  Date: 12/22/2020 EKG Time: 1207 Rate: 103 Rhythm: Tachycardic sinus rhythm QRS Axis: normal Intervals: normal ST/T Wave abnormalities: normal Narrative Interpretation: Tachycardic sinus rhythm.  No evidence of acute ischemia  ____________________________________________  RADIOLOGY  ED MD interpretation: 2 view chest x-ray shows no evidence of acute abnormalities including no pneumonia, pneumothorax, or widened mediastinum  Official radiology report(s): DG Chest 2 View  Result Date: 12/22/2020 CLINICAL DATA:  Shortness of breath EXAM: CHEST - 2 VIEW COMPARISON:  Radiograph 02/28/2015 FINDINGS: Unchanged cardiomediastinal silhouette. There is no focal airspace consolidation. There is no pleural effusion or visible  pneumothorax. There is no acute osseous abnormality. Thoracic spondylosis. IMPRESSION: No evidence of acute cardiopulmonary disease. Electronically Signed   By: Maurine Simmering M.D.   On: 12/22/2020 12:27    ____________________________________________   PROCEDURES  Procedure(s) performed (including Critical Care):  .1-3 Lead EKG Interpretation Performed by: Naaman Plummer, MD Authorized by: Naaman Plummer, MD     Interpretation: normal     ECG rate:  92   ECG rate assessment: normal     Rhythm: sinus rhythm     Ectopy: none     Conduction: normal    CRITICAL CARE Performed by: Naaman Plummer   Total critical care time: 31 minutes  Critical care time was exclusive of separately billable procedures and treating other patients.  Critical care was necessary to treat or prevent imminent or life-threatening deterioration.  Critical care was time spent personally by me on the following activities: development of treatment plan with patient and/or surrogate as well as nursing, discussions with consultants, evaluation of patient's response to treatment, examination of patient, obtaining history from patient or surrogate, ordering and performing treatments and interventions, ordering and review of laboratory studies, ordering and review of radiographic studies, pulse oximetry and re-evaluation of patient's condition.  ____________________________________________   INITIAL IMPRESSION / ASSESSMENT AND PLAN / ED COURSE  As part of my medical decision making, I reviewed the following data within the electronic medical record, if available:  Nursing notes reviewed and incorporated, Labs reviewed, EKG interpreted, Old chart reviewed, Radiograph reviewed and Notes from prior ED visits reviewed and incorporated        The patient appears to be suffering from a moderate/severe exacerbation of COPD.  Based on the history, exam, CXR/EKG reviewed by me, and further workup I dont suspect any  other emergent cause of this presentation, such as pneumonia, acute coronary syndrome, congestive heart failure, pulmonary embolism, or pneumothorax.  ED Interventions: bronchodilators, steroids, antibiotics, reassess  Reassessment: After treatment, the patients shortness of breath is improving but patient is still requiring supplemental oxygenation   Disposition: Admit      ____________________________________________   FINAL CLINICAL IMPRESSION(S) / ED DIAGNOSES  Final diagnoses:  Acute respiratory failure with hypoxia ([place])  COPD exacerbation ([place])  Shortness of breath     ED Discharge Orders     None        Note:  This document was prepared using Dragon voice recognition software and may include unintentional dictation errors.    Naaman Plummer, MD 12/22/20 [phone]

## 2020-12-22 NOTE — ED Notes (Signed)
Pt here via EMS from home with c/o SHOB, copd, cough productive, denies fever, hx sciatic pain, VSS, bg [id number]; #20LAC, placed in wheelchair, in lobby, 4L per [place]. NAD.

## 2020-12-22 NOTE — ED Triage Notes (Signed)
Pt to ED [facility] From home for worsening shob  for weeks. Pt speaking in short sentences. Hx CHF, COPD Denies pain States feels nervous  93% on 2L [place], states does not wear o2 at home  Tested pos for COVID 2 weeks ago

## 2020-12-22 NOTE — Progress Notes (Signed)
Remdesivir - Pharmacy Brief Note   O:  CXR: No evidence of acute cardiopulmonary disease SpO2: 92% on 3L   A/P:  Remdesivir 200 mg IVPB once followed by 100 mg IVPB daily x 4 days.   Sherilyn Banker, PharmD Clinical Pharmacist 12/22/2020 3:52 PM

## 2020-12-22 NOTE — ED Notes (Signed)
Lab called to run initial troponin

## 2020-12-22 NOTE — ED Notes (Signed)
Pt reports living home alone and not having help, reports difficulty caring for himself and preparing meals.

## 2020-12-22 NOTE — H&P (Signed)
History and Physical    Charles Ewing:[id number] DOB: 07/18/1940 DOA: 12/22/2020  PCP: Olin Hauser, DO   Patient coming from: Home  I have personally briefly reviewed patient's old medical records in [place]  Chief Complaint: Shortness of breath  HPI: Charles Ewing is a 80 y.o. male with medical history significant for nicotine dependence hypertension, depression, coronary artery disease who presents to the emergency room via EMS for evaluation of worsening shortness of breath with exertion from his baseline associated with a cough productive of yellow phlegm. Patient states that he was diagnosed with COVID-19 viral infection about 10 days ago but did not receive any antiviral therapy. He states that his symptoms initially improved but over the last 3 days he developed worsening shortness of breath from his baseline associated with a productive cough and chest congestion.  He denies having any fever or chills presently but had that when he was diagnosed with the COVID-19 viral infection. He denies having any nausea, no vomiting, no abdominal pain, no changes in his bowel habits, no dizziness, no lightheadedness, no urinary frequency, no nocturia, no dysuria, no blurred vision, no focal deficit, no myalgias, no headache. Labs show sodium 131, potassium 4.3, chloride 97, bicarb 27, glucose 133, BUN 16, creatinine 0.74, calcium 8.3, 3 troponin 5, white count 9.6, hemoglobin 14.7, hematocrit 43.5, MCV 92.0, RDW 13.5, platelet count 211 Patient's SARS coronavirus 2 point-of-care test is positive Chest x-ray reviewed by me shows no evidence of acute cardiopulmonary disease Twelve-lead EKG reviewed by me shows sinus tachycardia with PACs.  Nonspecific ST changes.     ED Course: Patient is an 80 year old male who presents to the ER for evaluation of a 3-day history of worsening shortness of breath from his baseline associated with wheezing and a cough productive of yellow  phlegm. He was diagnosed with COVID-19 viral infection about 10 days ago but did not receive antiviral therapy. He will be admitted to the hospital for further evaluation.    Review of Systems: As per HPI otherwise all other systems reviewed and negative.    Past Medical History:  Diagnosis Date   Abdominal aortic aneurysm (AAA) 2013   found at [facility]-   Anxiety    Arrhythmia    Arthritis    Colon polyp    COPD (chronic obstructive pulmonary disease) ([place])    Coronary artery disease    Depression    GERD (gastroesophageal reflux disease)    Hyperlipidemia    Hypertension    Skin cancer    Thrush     Past Surgical History:  Procedure Laterality Date   CATARACT EXTRACTION W/PHACO Left 05/09/2020   Procedure: CATARACT EXTRACTION PHACO AND INTRAOCULAR LENS PLACEMENT (IOC) LEFT DIABETIC 9.09 01:02.0;  Surgeon: Birder Robson, MD;  Location: [facility];  Service: Ophthalmology;  Laterality: Left;   CATARACT EXTRACTION W/PHACO Right 05/23/2020   Procedure: CATARACT EXTRACTION PHACO AND INTRAOCULAR LENS PLACEMENT (IOC) RIGHT DIABETIC 8.13 01:00.3;  Surgeon: Birder Robson, MD;  Location: [facility];  Service: Ophthalmology;  Laterality: Right;   Colon polyp surgery     SKIN CANCER EXCISION     TRANSURETHRAL RESECTION OF PROSTATE  2010     reports that he has been smoking cigarettes. He has a 70.00 pack-year smoking history. He uses smokeless tobacco. He reports that he does not drink alcohol and does not use drugs.  Allergies  Allergen Reactions   Nexium [Esomeprazole Magnesium]     Family History  Problem  Relation Age of Onset   Kidney cancer Mother    Prostate cancer Neg Hx    Bladder Cancer Neg Hx       Prior to Admission medications   Medication Sig Start Date End Date Taking? Authorizing Provider  albuterol (VENTOLIN HFA) 108 (90 Base) MCG/ACT inhaler INHALE 2 PUFFS INTO THE LUNGS EVERY 6 HOURS AS NEEDED FOR WHEEZING OR SHORTNESS OF BREATH.  09/23/19  Yes Karamalegos, Alexander J, DO  amLODipine (NORVASC) 5 MG tablet Take 5 mg by mouth daily. 05/16/17  Yes [provider]  aspirin EC 81 MG tablet Take 81 mg by mouth every evening.   Yes [provider]  clotrimazole (MYCELEX) 10 MG troche Take 10 mg by mouth 2 (two) times daily. 10/27/19  Yes [provider]  FLUoxetine (PROZAC) 20 MG capsule Take 1 capsule (20 mg total) by mouth 2 (two) times daily. 11/06/20  Yes Karamalegos, Devonne Doughty, DO  fluticasone (FLONASE) 50 MCG/ACT nasal spray PLACE 2 SPRAYS INTO BOTH NOSTRILS DAILY 12/09/20  Yes Karamalegos, Devonne Doughty, DO  furosemide (LASIX) 20 MG tablet TAKE ONE-HALF TO ONE TABLET BY MOUTH DAILY 10/23/20  Yes Karamalegos, Devonne Doughty, DO  HYDROcodone bit-homatropine (HYCODAN) 5-1.5 MG/5ML syrup Take 5 mLs by mouth every 8 (eight) hours as needed for cough. 12/16/20  Yes Baity, Coralie Keens, NP  LORazepam (ATIVAN) 0.5 MG tablet TAKE 1 TABLET BY MOUTH AT BEDTIME AS NEEDED FOR ANXIETY 12/19/20  Yes Karamalegos, Devonne Doughty, DO  losartan (COZAAR) 25 MG tablet TAKE 1 TABLET BY MOUTH DAILY 12/18/20  Yes Karamalegos, Devonne Doughty, DO  meloxicam (MOBIC) 15 MG tablet meloxicam 15 mg tablet  TAKE 1 TABLET BY MOUTH DAILY WITH MEALS   Yes [provider]  metFORMIN (GLUCOPHAGE-XR) 500 MG 24 hr tablet TAKE 1 TABLET BY MOUTH DAILY WITH BREAKFAST 12/01/20  Yes Karamalegos, Devonne Doughty, DO  metoprolol succinate (TOPROL-XL) 100 MG 24 hr tablet Take 100 mg by mouth daily. Take with or immediately following a meal.   Yes [provider]  neomycin-polymyxin-hydrocortisone (CORTISPORIN) 3.5-10000-1 OTIC suspension  05/12/17  Yes [provider]  nystatin (MYCOSTATIN) 100000 UNIT/ML suspension TAKE 5 mls (1 TEASPOONFUL) BY MOUTH 4 TIMES DAILY. 09/03/18  Yes Karamalegos, Devonne Doughty, DO  oxyCODONE (OXY IR/ROXICODONE) 5 MG immediate release tablet Take 1 tablet (5 mg total) by mouth every 4 (four) hours as needed for severe  pain. 11/27/20  Yes Karamalegos, Devonne Doughty, DO  pantoprazole (PROTONIX) 20 MG tablet Take 20 mg by mouth daily. 01/10/20  Yes [provider]  predniSONE (DELTASONE) 10 MG tablet Take 6 tabs on day 1, 5 tabs on day 2, 4 tabs on day 3, 3 tabs on day 4, 2 tabs on day 5, 1 tab on day 6 12/16/20  Yes Baity, Coralie Keens, NP  tamsulosin (FLOMAX) 0.4 MG CAPS capsule Take 1 capsule (0.4 mg total) by mouth daily after breakfast. 04/14/20  Yes Karamalegos, Devonne Doughty, DO  baclofen (LIORESAL) 10 MG tablet Take 0.5-1 tablets (5-10 mg total) by mouth 3 (three) times daily as needed for muscle spasms. Patient not taking: No sig reported 04/14/20   Olin Hauser, DO  Blood Glucose Monitoring Suppl (ONE TOUCH ULTRA SYSTEM KIT) w/Device KIT 1 kit by Does not apply route once. CHeck blood glucose in the AM three times weekly. 05/15/15   Krebs, Genevie Cheshire, NP  dicyclomine (BENTYL) 10 MG capsule dicyclomine 10 mg capsule Patient not taking: No sig reported    [provider]  Elastic Bandages & Supports (MEDICAL COMPRESSION STOCKINGS) MISC 1 each by Does not apply route daily. 06/12/15   Krebs, Genevie Cheshire, NP  esomeprazole (NEXIUM) 40 MG capsule esomeprazole magnesium 40 mg capsule,delayed release Patient not taking: No sig reported    [provider]  FLOVENT DISKUS 50 MCG/BLIST diskus inhaler INHALE 1 PUFF BY MOUTH INTO THE LUNGS 2 TIMES DAILY Patient not taking: No sig reported 09/21/18   Olin Hauser, DO  glucose blood test strip Check blood glucose three times weekly. 02/07/17   Karamalegos, Devonne Doughty, DO  hydrochlorothiazide (HYDRODIURIL) 25 MG tablet  07/09/18   [provider]  Lancets Glory Rosebush ULTRASOFT) lancets Use as instructed 02/07/17   Olin Hauser, DO  loratadine (CLARITIN) 10 MG tablet Take 10 mg by mouth every evening. Patient not taking: No sig reported    [provider]  metoprolol succinate (TOPROL-XL) 50 MG 24 hr tablet Take  1 tablet (50 mg total) by mouth daily. Take with or immediately following a meal. Patient not taking: No sig reported 11/17/15   Krebs, Genevie Cheshire, NP  mupirocin cream (BACTROBAN) 2 % Apply 1 application topically 2 (two) times daily. For 1-2 weeks, as needed Patient not taking: No sig reported 01/21/20   Olin Hauser, DO  polyethylene glycol powder (GLYCOLAX/MIRALAX) 17 GM/SCOOP powder USE HALF UP TO 1 CAPFUL BY MOUTH DAILY FOR MAINTENANCE, IF ACUTE WORSENING CONSTIPATION CAN INCREASE TO 2 CAPFULS DAILY OR 1 CAPFUL TWICE DA Patient not taking: No sig reported 09/08/18   Olin Hauser, DO  potassium chloride (MICRO-K) 10 MEQ CR capsule  07/16/18   [provider]  simvastatin (ZOCOR) 20 MG tablet Take 20 mg by mouth at bedtime. Patient not taking: No sig reported    [provider]  sucralfate (CARAFATE) 1 g tablet Take 1 tablet (1 g total) by mouth 4 (four) times daily -  with meals and at bedtime. As needed Patient not taking: No sig reported 04/14/20   Olin Hauser, DO  tiotropium (SPIRIVA HANDIHALER) 18 MCG inhalation capsule Place 1 capsule (18 mcg total) into inhaler and inhale daily. Patient not taking: No sig reported 11/04/17   Olin Hauser, DO  triamcinolone cream (KENALOG) 0.5 % Apply 1 application topically 2 (two) times daily. To affected areas, for up to 2 weeks. Patient not taking: Reported on 12/22/2020 08/26/18   Olin Hauser, DO    Physical Exam: Vitals:   12/22/20 1338 12/22/20 1400 12/22/20 1430 12/22/20 1530  BP: 124/73 108/60 (!) 117/54 119/65  Pulse: 92 93 94 92  Resp: (!) 21 (!) 22 (!) 24 (!) 21  Temp:    98.2 F (36.8 C)  TempSrc:      SpO2: 96% 97% 90% 92%  Weight:      Height:         Vitals:   12/22/20 1338 12/22/20 1400 12/22/20 1430 12/22/20 1530  BP: 124/73 108/60 (!) 117/54 119/65  Pulse: 92 93 94 92  Resp: (!) 21 (!) 22 (!) 24 (!) 21  Temp:    98.2 F (36.8 C)  TempSrc:       SpO2: 96% 97% 90% 92%  Weight:      Height:          Constitutional: Alert and oriented x 3 . Not in any apparent distress.  Has audible wheezes HEENT:      Head: Normocephalic and atraumatic.         Eyes: PERLA, EOMI,  Conjunctivae are normal. Sclera is non-icteric.       Mouth/Throat: Mucous membranes are moist.       Neck: Supple with no signs of meningismus. Cardiovascular: Tachycardic. No murmurs, gallops, or rubs. 2+ symmetrical distal pulses are present . No JVD. No LE edema Respiratory: Tachypneic. Diffuse wheezes in both lung fields. No crackles, or rhonchi.  Gastrointestinal: Soft, non tender, and non distended with positive bowel sounds.  Genitourinary: No CVA tenderness. Musculoskeletal: Nontender with normal range of motion in all extremities. No cyanosis, or erythema of extremities. Neurologic:  Face is symmetric. Moving all extremities. No gross focal neurologic deficits . Skin: Skin is warm, dry.  No rash or ulcers Psychiatric: Mood and affect are normal    Labs on Admission: I have personally reviewed following labs and imaging studies  CBC: Recent Labs  Lab 12/22/20 1200  WBC 9.6  HGB 14.7  HCT 43.5  MCV 92.0  PLT 767   Basic Metabolic Panel: Recent Labs  Lab 12/22/20 1200  NA 131*  K 4.3  CL 97*  CO2 27  GLUCOSE 133*  BUN 16  CREATININE 0.74  CALCIUM 8.3*   GFR: Estimated Creatinine Clearance: 90.1 mL/min (by C-G formula based on SCr of 0.74 mg/dL). Liver Function Tests: No results for input(s): AST, ALT, ALKPHOS, BILITOT, PROT, ALBUMIN in the last 168 hours. No results for input(s): LIPASE, AMYLASE in the last 168 hours. No results for input(s): AMMONIA in the last 168 hours. Coagulation Profile: No results for input(s): INR, PROTIME in the last 168 hours. Cardiac Enzymes: No results for input(s): CKTOTAL, CKMB, CKMBINDEX, TROPONINI in the last 168 hours. BNP (last 3 results) No results for input(s): PROBNP in the last 8760  hours. HbA1C: No results for input(s): HGBA1C in the last 72 hours. CBG: No results for input(s): GLUCAP in the last 168 hours. Lipid Profile: No results for input(s): CHOL, HDL, LDLCALC, TRIG, CHOLHDL, LDLDIRECT in the last 72 hours. Thyroid Function Tests: No results for input(s): TSH, T4TOTAL, FREET4, T3FREE, THYROIDAB in the last 72 hours. Anemia Panel: No results for input(s): VITAMINB12, FOLATE, FERRITIN, TIBC, IRON, RETICCTPCT in the last 72 hours. Urine analysis:    Component Value Date/Time   COLORURINE YELLOW (A) 03/01/2015 0235   APPEARANCEUR Clear 09/25/2017 1344   LABSPEC 1.013 03/01/2015 0235   LABSPEC 1.015 10/15/2013 0912   PHURINE 6.0 03/01/2015 0235   GLUCOSEU Negative 09/25/2017 1344   GLUCOSEU Negative 10/15/2013 0912   HGBUR NEGATIVE 03/01/2015 0235   BILIRUBINUR negative 10/07/2019 1124   BILIRUBINUR Negative 09/25/2017 1344   BILIRUBINUR Negative 10/15/2013 0912   KETONESUR NEGATIVE 03/01/2015 0235   PROTEINUR Positive (A) 10/07/2019 1124   PROTEINUR Negative 09/25/2017 1344   PROTEINUR NEGATIVE 03/01/2015 0235   UROBILINOGEN >=8.0 (A) 10/07/2019 1124   NITRITE neg 10/07/2019 1124   NITRITE Negative 09/25/2017 1344   NITRITE NEGATIVE 03/01/2015 0235   LEUKOCYTESUR Moderate (2+) (A) 10/07/2019 1124   LEUKOCYTESUR 1+ (A) 09/25/2017 1344   LEUKOCYTESUR Trace 10/15/2013 0912    Radiological Exams on Admission: DG Chest 2 View  Result Date: 12/22/2020 CLINICAL DATA:  Shortness of breath EXAM: CHEST - 2 VIEW COMPARISON:  Radiograph 02/28/2015 FINDINGS: Unchanged cardiomediastinal silhouette. There is no focal airspace consolidation. There is no pleural effusion or visible pneumothorax. There is no acute osseous abnormality. Thoracic spondylosis. IMPRESSION: No evidence of acute cardiopulmonary disease. Electronically Signed   By: Maurine Simmering M.D.   On: 12/22/2020 12:27     Assessment/Plan Principal Problem:  COPD with acute exacerbation (HCC) Active  Problems:   Benign prostatic hyperplasia with urinary obstruction   Coronary artery disease   Depression   Hypertension   COVID-19 virus infection   Nicotine dependence   Cardiomyopathy [facility])     Patient is an 80 year old male who was diagnosed with COVID-19 viral infection about 10 days ago.  He presents to the ER for evaluation of worsening shortness of breath, productive cough and wheezing consistent with COPD exacerbation.    Acute COPD exacerbation Patient with a history of COPD and nicotine dependence who presents to the emergency room for evaluation of worsening shortness of breath from his baseline associated with a cough productive of yellow phlegm and diffuse wheezing. Will place patient on Rocephin 1 g IV daily Place patient on scheduled and as needed bronchodilator therapy as well as inhaled steroids Continue oxygen supplementation to maintain pulse oximetry greater than 92%    COVID-19 viral infection Patient's initial positive test was over 10 days ago His CT level is 23.1 consistent with an active infection Chest x-ray does not show any evidence of pneumonia Supportive care with bronchodilator therapy, antitussives and vitamin Will place patient on remdesivir per protocol Continue systemic steroids     Cardiomyopathy Last known LVEF from a 2D echocardiogram done in 2018 was 45% Hold metoprolol, losartan and furosemide due to relative hypotension. Will repeat 2D echocardiogram to assess LVEF     Depression Continue fluoxetine   Nicotine dependence Smoking cessation was discussed with patient in detail he declines a nicotine transdermal patch at this time.   GERD Stable Continue PPI    BPH Continue Flomax   DVT prophylaxis: Lovenox  Code Status: DO NOT RESUSCITATE Family Communication: Greater than 50% of time was spent discussing patient's condition and plan of care with him at the bedside.  All questions and concerns have been addressed.   He verbalizes understanding and agrees with the plan.  CODE STATUS was discussed and he wishes to be placed on a DO NOT RESUSCITATE status.  He lists his son as his healthcare power of attorney. Disposition Plan: Back to previous home environment Consults called: none  Status:At the time of admission, it appears that the appropriate admission status for this patient is inpatient. This is judged to be reasonable and necessary to provide the required intensity of service to ensure the patient's safety given the presenting symptoms, physical exam findings, and initial radiographic and laboratory data in the context of their comorbid conditions. Patient requires inpatient status due to high intensity of service, high risk for further deterioration and high frequency of surveillance required.     Collier Bullock MD [facility]     12/22/2020, 3:51 PM

## 2020-12-23 ENCOUNTER — Inpatient Hospital Stay
Admit: 2020-12-23 | Discharge: 2020-12-23 | Disposition: A | Discharge status: Home / Self Care | Payer: Medicare Other | Attending: Internal Medicine | Admitting: Internal Medicine

## 2020-12-23 DIAGNOSIS — I5021 Acute systolic (congestive) heart failure: Secondary | ICD-10-CM | POA: Diagnosis not present

## 2020-12-23 DIAGNOSIS — J441 Chronic obstructive pulmonary disease with (acute) exacerbation: Secondary | ICD-10-CM | POA: Diagnosis not present

## 2020-12-23 LAB — ECHOCARDIOGRAM COMPLETE
AR max vel: 2.02 cm2
AV Peak grad: 4 mmHg
Ao pk vel: 1 m/s
Area-P 1/2: 3.02 cm2
Height: 72 in
S' Lateral: 3.7 cm
Weight: 3520 oz

## 2020-12-23 LAB — CBC
HCT: 40.6 % (ref 39.0–52.0)
Hemoglobin: 13.5 g/dL (ref 13.0–17.0)
MCH: 30.2 pg (ref 26.0–34.0)
MCHC: 33.3 g/dL (ref 30.0–36.0)
MCV: 90.8 fL (ref 80.0–100.0)
Platelets: 190 10*3/uL (ref 150–400)
RBC: 4.47 MIL/uL (ref 4.22–5.81)
RDW: 13.2 % (ref 11.5–15.5)
WBC: 6.4 10*3/uL (ref 4.0–10.5)
nRBC: 0 % (ref 0.0–0.2)

## 2020-12-23 LAB — BASIC METABOLIC PANEL
Anion gap: 6 (ref 5–15)
BUN: 18 mg/dL (ref 8–23)
CO2: 26 mmol/L (ref 22–32)
Calcium: 8.2 mg/dL — ABNORMAL LOW (ref 8.9–10.3)
Chloride: 100 mmol/L (ref 98–111)
Creatinine, Ser: 0.7 mg/dL (ref 0.61–1.24)
GFR, Estimated: >60 mL/min (ref >60)
Glucose, Bld: 182 mg/dL — ABNORMAL HIGH (ref 70–99)
Potassium: 4.6 mmol/L (ref 3.5–5.1)
Sodium: 132 mmol/L — ABNORMAL LOW (ref 135–145)

## 2020-12-23 LAB — C-REACTIVE PROTEIN: CRP: 22 mg/dL — ABNORMAL HIGH (ref <1.0)

## 2020-12-23 LAB — D-DIMER, QUANTITATIVE: D-Dimer, Quant: 0.73 ug/mL-FEU — ABNORMAL HIGH (ref 0.00–0.50)

## 2020-12-23 LAB — GLUCOSE, CAPILLARY
Glucose-Capillary: 137 mg/dL — ABNORMAL HIGH (ref 70–99)
Glucose-Capillary: 129 mg/dL — ABNORMAL HIGH (ref 70–99)
Glucose-Capillary: 149 mg/dL — ABNORMAL HIGH (ref 70–99)
Glucose-Capillary: 166 mg/dL — ABNORMAL HIGH (ref 70–99)

## 2020-12-23 MED ORDER — SODIUM CHLORIDE 0.9 % IV SOLN
500.0000 mg | INTRAVENOUS | Status: DC
  Administered 2020-12-23 – 2020-12-27 (×5): 500 mg via INTRAVENOUS
  Filled 2020-12-23 (×5): qty 500

## 2020-12-23 NOTE — Progress Notes (Signed)
*  PRELIMINARY RESULTS* Echocardiogram 2D Echocardiogram has been performed.  Charles Ewing 12/23/2020, 12:16 PM

## 2020-12-23 NOTE — Progress Notes (Signed)
PROGRESS NOTE    Charles Ewing  LEX:[id number] DOB: 09-03-1940 DOA: 12/22/2020 PCP: Olin Hauser, DO    Brief Narrative:  Charles Ewing is a 80 y.o. male with medical history significant for nicotine dependence hypertension, depression, coronary artery disease who presents to the emergency room via EMS for evaluation of worsening shortness of breath with exertion from his baseline associated with a cough productive of yellow phlegm. Patient states that he was diagnosed with COVID-19 viral infection about 10 days ago but did not receive any antiviral therapy. He states that his symptoms initially improved but over the last 3 days he developed worsening shortness of breath from his baseline associated with a productive cough and chest congestion.  He denies having any fever or chills presently but had that when he was diagnosed with the COVID-19 viral infection.    Consultants:    Procedures:   Antimicrobials:  Ceftriaxone, remdesivir    Subjective: Feels "ok" unable to tell me if sob better. No cp  Objective: Vitals:   12/22/20 2056 12/23/20 0030 12/23/20 0632 12/23/20 0752  BP: (!) 154/92 135/65 (!) 160/94 (!) 155/86  Pulse: 100 81 88 84  Resp: 20 19 20 20   Temp: 97.6 F (36.4 C) 97.6 F (36.4 C) 97.8 F (36.6 C) (!) 97.5 F (36.4 C)  TempSrc:  Axillary  Oral  SpO2: 93% 91% 97% 97%  Weight:      Height:        Intake/Output Summary (Last 24 hours) at 12/23/2020 0820 Last data filed at 12/23/2020 0000 Gross per 24 hour  Intake 892.12 ml  Output --  Net 892.12 ml   Filed Weights   12/22/20 1159  Weight: 99.8 kg    Examination:  General exam: Appears calm and comfortable  Respiratory system: mild wheezing. No rales Cardiovascular system: S1 & S2 heard, RRR. No JVD, murmurs, rubs, gallops or clicks.  Gastrointestinal system: Abdomen is nondistended, soft and nontender.Normal bowel sounds heard. Central nervous system: Alert and oriented.  Grossly intact Extremities: no edema Psychiatry:  Mood & affect appropriate.     Data Reviewed: I have personally reviewed following labs and imaging studies  CBC: Recent Labs  Lab 12/22/20 1200 12/23/20 0456  WBC 9.6 6.4  HGB 14.7 13.5  HCT 43.5 40.6  MCV 92.0 90.8  PLT 211 449   Basic Metabolic Panel: Recent Labs  Lab 12/22/20 1200 12/23/20 0456  NA 131* 132*  K 4.3 4.6  CL 97* 100  CO2 27 26  GLUCOSE 133* 182*  BUN 16 18  CREATININE 0.74 0.70  CALCIUM 8.3* 8.2*   GFR: Estimated Creatinine Clearance: 90.1 mL/min (by C-G formula based on SCr of 0.7 mg/dL). Liver Function Tests: No results for input(s): AST, ALT, ALKPHOS, BILITOT, PROT, ALBUMIN in the last 168 hours. No results for input(s): LIPASE, AMYLASE in the last 168 hours. No results for input(s): AMMONIA in the last 168 hours. Coagulation Profile: No results for input(s): INR, PROTIME in the last 168 hours. Cardiac Enzymes: No results for input(s): CKTOTAL, CKMB, CKMBINDEX, TROPONINI in the last 168 hours. BNP (last 3 results) No results for input(s): PROBNP in the last 8760 hours. HbA1C: No results for input(s): HGBA1C in the last 72 hours. CBG: Recent Labs  Lab 12/22/20 1717 12/22/20 2059 12/23/20 0750  GLUCAP 164* 230* 137*   Lipid Profile: No results for input(s): CHOL, HDL, LDLCALC, TRIG, CHOLHDL, LDLDIRECT in the last 72 hours. Thyroid Function Tests: No results for input(s): TSH, T4TOTAL,  FREET4, T3FREE, THYROIDAB in the last 72 hours. Anemia Panel: No results for input(s): VITAMINB12, FOLATE, FERRITIN, TIBC, IRON, RETICCTPCT in the last 72 hours. Sepsis Labs: No results for input(s): PROCALCITON, LATICACIDVEN in the last 168 hours.  Recent Results (from the past 240 hour(s))  Novel Coronavirus, NAA ([facility])     Status: Abnormal   Collection Time: 12/14/20 12:00 AM   Specimen: Nasopharyngeal(NP) swabs in vial transport medium   Nasopharynge  Previous  Result Value Ref Range Status    SARS-CoV-2, NAA Detected (A) Not Detected Final    Comment: Patients who have a positive COVID-19 test result may now have treatment options. Treatment options are available for patients with mild to moderate symptoms and for hospitalized patients. Visit our website at [email or web address] for resources and information. This nucleic acid amplification test was developed and its performance characteristics determined by [employer]. Nucleic acid amplification tests include RT-PCR and TMA. This test has not been FDA cleared or approved. This test has been authorized by FDA under an Emergency Use Authorization (EUA). This test is only authorized for the duration of time the declaration that circumstances exist justifying the authorization of the emergency use of in vitro diagnostic tests for detection of SARS-CoV-2 virus and/or diagnosis of COVID-19 infection under section 564(b)(1) of the Act, 21 U.S.C. 387FIE-3(P) (1), unless the authorization is terminated or revoked sooner. When diagnostic testing is negativ e, the possibility of a false negative result should be considered in the context of a patient's recent exposures and the presence of clinical signs and symptoms consistent with COVID-19. An individual without symptoms of COVID-19 and who is not shedding SARS-CoV-2 virus would expect to have a negative (not detected) result in this assay.   SARS-COV-2, NAA 2 DAY TAT     Status: None   Collection Time: 12/14/20 12:00 AM   Nasopharynge  Previous  Result Value Ref Range Status   SARS-CoV-2, NAA 2 DAY TAT Performed  Final  Resp Panel by RT-PCR (Flu A&B, Covid) Nasopharyngeal Swab     Status: Abnormal   Collection Time: 12/22/20  1:46 PM   Specimen: Nasopharyngeal Swab; Nasopharyngeal(NP) swabs in vial transport medium  Result Value Ref Range Status   SARS Coronavirus 2 by RT PCR POSITIVE (A) NEGATIVE Final    Comment: RESULT CALLED TO, READ BACK BY AND  VERIFIED WITH: JACKIE FERGUSON @1444  12/22/20 MJU (NOTE) SARS-CoV-2 target nucleic acids are DETECTED.  The SARS-CoV-2 RNA is generally detectable in upper respiratory specimens during the acute phase of infection. Positive results are indicative of the presence of the identified virus, but do not rule out bacterial infection or co-infection with other pathogens not detected by the test. Clinical correlation with patient history and other diagnostic information is necessary to determine patient infection status. The expected result is Negative.  Fact Sheet for Patients: EntrepreneurPulse.com.au  Fact Sheet for Healthcare Providers: IncredibleEmployment.be  This test is not yet approved or cleared by the [place] FDA and  has been authorized for detection and/or diagnosis of SARS-CoV-2 by FDA under an Emergency Use Authorization (EUA).  This EUA will remain in effect (meaning this test can b e used) for the duration of  the COVID-19 declaration under Section 564(b)(1) of the Act, 21 U.S.C. section 360bbb-3(b)(1), unless the authorization is terminated or revoked sooner.     Influenza A by PCR NEGATIVE NEGATIVE Final   Influenza B by PCR NEGATIVE NEGATIVE Final    Comment: (NOTE) The Xpert Xpress SARS-CoV-2/FLU/RSV plus assay is intended  as an aid in the diagnosis of influenza from Nasopharyngeal swab specimens and should not be used as a sole basis for treatment. Nasal washings and aspirates are unacceptable for Xpert Xpress SARS-CoV-2/FLU/RSV testing.  Fact Sheet for Patients: EntrepreneurPulse.com.au  Fact Sheet for Healthcare Providers: IncredibleEmployment.be  This test is not yet approved or cleared by the [place] FDA and has been authorized for detection and/or diagnosis of SARS-CoV-2 by FDA under an Emergency Use Authorization (EUA). This EUA will remain in effect (meaning this test  can be used) for the duration of the COVID-19 declaration under Section 564(b)(1) of the Act, 21 U.S.C. section 360bbb-3(b)(1), unless the authorization is terminated or revoked.  Performed at [facility], [address] [place], [place] [address]          Radiology Studies: DG Chest 2 View  Result Date: 12/22/2020 CLINICAL DATA:  Shortness of breath EXAM: CHEST - 2 VIEW COMPARISON:  Radiograph 02/28/2015 FINDINGS: Unchanged cardiomediastinal silhouette. There is no focal airspace consolidation. There is no pleural effusion or visible pneumothorax. There is no acute osseous abnormality. Thoracic spondylosis. IMPRESSION: No evidence of acute cardiopulmonary disease. Electronically Signed   By: Maurine Simmering M.D.   On: 12/22/2020 12:27        Scheduled Meds:  albuterol  2 puff Inhalation Q6H   aspirin EC  81 mg Oral QPM   enoxaparin (LOVENOX) injection  40 mg Subcutaneous Q24H   FLUoxetine  20 mg Oral BID   insulin aspart  0-15 Units Subcutaneous TID WC   insulin detemir  0.075 Units/kg Subcutaneous BID   loratadine  10 mg Oral QPM   LORazepam  0.5 mg Oral QHS   multivitamin with minerals  1 tablet Oral Daily   pantoprazole  40 mg Oral Daily   predniSONE  40 mg Oral Q breakfast   simvastatin  20 mg Oral QHS   sucralfate  1 g Oral TID WC & HS   tamsulosin  0.4 mg Oral QPC breakfast   zinc sulfate  220 mg Oral Daily   Continuous Infusions:  azithromycin     cefTRIAXone (ROCEPHIN)  IV Stopped (12/22/20 2240)   remdesivir 100 mg in NS 100 mL      Assessment & Plan:   Principal Problem:   COPD with acute exacerbation ([place]) Active Problems:   Benign prostatic hyperplasia with urinary obstruction   Coronary artery disease   Depression   Hypertension   COVID-19 virus infection   Nicotine dependence   Cardiomyopathy [facility])   Patient is an 80 year old male who was diagnosed with COVID-19 viral infection about 10 days ago.  He presents to the ER for evaluation  of worsening shortness of breath, productive cough and wheezing consistent with COPD exacerbation.       Acute COPD exacerbation Patient with a history of COPD and nicotine dependence and COVID-positive infection Unsure if he is clinically improving as he is not able to tell me.  Will continue steroids  Continue ceftriaxone.  We will add azithromycin  O2 supplementation to keep O2 sats above 92%  Continue inhalers        COVID-19 viral infection Patient's initial positive test was over 10 days ago His CT level is 23.1 consistent with an active infection Continue supportive care Continue remdesivir per protocol Continue systemic steroids as above        Cardiomyopathy Last known LVEF from a 2D echocardiogram done in 2018 was 45% 11/12 holding metoprolol, losartan, furosemide due to relative hypotension  May need to use Lasix as needed since he is on steroids  Obtain 2D echo pending          Depression Continue fluoxetine     Nicotine dependence Smoking counseling was done  Patient declined nicotine patch       GERD Continue PPI       BPH Continue Flomax   DVT prophylaxis: Lovenox Code Status: DNR Family Communication: None at bedside Disposition Plan: Echo Status is: Inpatient  Remains inpatient appropriate because: Due to requirement of IV treatment and severity of his illness.            LOS: 1 day   Time spent: 45 minutes with more than 50% on [place], MD [facility] Pager [phone]  If 7PM-7AM, please contact night-coverage 12/23/2020, 8:20 AM

## 2020-12-24 DIAGNOSIS — J441 Chronic obstructive pulmonary disease with (acute) exacerbation: Secondary | ICD-10-CM | POA: Diagnosis not present

## 2020-12-24 LAB — C-REACTIVE PROTEIN: CRP: 11.2 mg/dL — ABNORMAL HIGH (ref <1.0)

## 2020-12-24 LAB — GLUCOSE, CAPILLARY
Glucose-Capillary: 88 mg/dL (ref 70–99)
Glucose-Capillary: 103 mg/dL — ABNORMAL HIGH (ref 70–99)
Glucose-Capillary: 132 mg/dL — ABNORMAL HIGH (ref 70–99)
Glucose-Capillary: 133 mg/dL — ABNORMAL HIGH (ref 70–99)

## 2020-12-24 LAB — D-DIMER, QUANTITATIVE: D-Dimer, Quant: 0.88 ug/mL-FEU — ABNORMAL HIGH (ref 0.00–0.50)

## 2020-12-24 MED ORDER — FUROSEMIDE 20 MG PO TABS
20.0000 mg | ORAL_TABLET | Freq: Every day | ORAL | Status: DC
  Administered 2020-12-24 – 2020-12-27 (×4): 20 mg via ORAL
  Filled 2020-12-24 (×4): qty 1

## 2020-12-24 MED ORDER — AMLODIPINE BESYLATE 5 MG PO TABS
5.0000 mg | ORAL_TABLET | Freq: Every day | ORAL | Status: DC
  Administered 2020-12-24 – 2020-12-27 (×4): 5 mg via ORAL
  Filled 2020-12-24 (×4): qty 1

## 2020-12-24 MED ORDER — BISACODYL 10 MG RE SUPP: 10.0000 mg | Freq: Every day | RECTAL | Status: DC | PRN

## 2020-12-24 MED ORDER — SENNOSIDES-DOCUSATE SODIUM 8.6-50 MG PO TABS
2.0000 | ORAL_TABLET | Freq: Every evening | ORAL | Status: DC | PRN
  Administered 2020-12-24: 22:00:00 2 via ORAL
  Filled 2020-12-24: qty 2

## 2020-12-24 NOTE — Evaluation (Signed)
Physical Therapy Evaluation Patient Details Name: Charles Ewing MRN: [id number] DOB: December 08, 1940 Today's Date: 12/24/2020  History of Present Illness  Pt is an 80 y.o. male presenting to hospital 12/22/20 with worsening SOB (diagnosed with COVID-19 viral infection about 10 days ago but did not receive antiviral therapy).  Pt admitted with acute COPD exacerbation, COVID-19 viral infection, and cardiomyopathy.  PMH includes AAA, anxiety, COPD, htn, skin CA, PTSD, B LE edema, CAD, orthostatic hypotension.  Clinical Impression  Prior to hospital admission, pt was ambulatory (pt's son has recently been staying with pt to assist d/t pt's recent sciatic/low back pain issues); lives in a 1 level home with 3 STE B railings.  Currently pt is SBA semi-supine to sitting edge of bed; CGA with transfers; and CGA to ambulate a few feet bed to recliner with RW (limited distance ambulating d/t increased LBP with standing activities but pain resolved with sitting in recliner).  SOB also noted with activity (O2 sats 92% or greater on 2.5 L O2 via nasal cannula during sessions activities).  Pt also reports h/o dizziness with activity (pt reports he has several medications with dizziness side effects).  Pt would benefit from skilled PT to address noted impairments and functional limitations (see below for any additional details).  Upon hospital discharge, pt would benefit from HHPT and intermittent assist from family.    Recommendations for follow up therapy are one component of a multi-disciplinary discharge planning process, led by the attending physician.  Recommendations may be updated based on patient status, additional functional criteria and insurance authorization.  Follow Up Recommendations Home health PT    Assistance Recommended at Discharge Intermittent Supervision/Assistance  Functional Status Assessment Patient has had a recent decline in their functional status and demonstrates the ability to make  significant improvements in function in a reasonable and predictable amount of time.  Equipment Recommendations  Rolling walker (2 wheels);BSC/3in1    Recommendations for Other Services OT consult     Precautions / Restrictions Precautions Precautions: Fall Restrictions Weight Bearing Restrictions: No      Mobility  Bed Mobility Overal bed mobility: Needs Assistance Bed Mobility: Supine to Sit     Supine to sit: Supervision;HOB elevated     General bed mobility comments: mild increased effort to perform on own    Transfers Overall transfer level: Needs assistance Equipment used: Rolling walker (2 wheels) Transfers: Sit to/from Stand Sit to Stand: Min guard           General transfer comment: increased effort to stand up to RW; vc's for UE placement    Ambulation/Gait Ambulation/Gait assistance: Min guard Gait Distance (Feet): 3 Feet (bed to recliner) Assistive device: Rolling walker (2 wheels)   Gait velocity: decreased     General Gait Details: decreased B LE step length/foot clearance  Stairs            Wheelchair Mobility    Modified Rankin (Stroke Patients Only)       Balance Overall balance assessment: Needs assistance Sitting-balance support: No upper extremity supported;Feet supported Sitting balance-Leahy Scale: Good Sitting balance - Comments: steady sitting reaching within BOS   Standing balance support: Single extremity supported Standing balance-Leahy Scale: Fair Standing balance comment: steady static standing with at least single UE support on RW                             Pertinent Vitals/Pain Pain Assessment: 0-10 Pain Score:  (0/10 at  rest; 6/10 with standing activity) Pain Location: sciatic/low back pain Pain Descriptors / Indicators: Aching;Sharp;Shooting Pain Intervention(s): Limited activity within patient's tolerance;Monitored during session;Repositioned HR WFL during sessions activities.    Home  Living Family/patient expects to be discharged to:: Private residence Living Arrangements: Alone (pt's son has been staying with pt since recent sciatic/back issues) Available Help at Discharge: Family Type of Home: Mobile home Home Access: Stairs to enter Entrance Stairs-Rails: Right;Left;Can reach both Entrance Stairs-Number of Steps: 3   Home Layout: One [place]: Grab bars - tub/shower      Prior Function Prior Level of Function : Independent/Modified Independent;History of Falls (last six months)             Mobility Comments: H/o falls (3-4 in past 6 months) ADLs Comments: Pt's son has been staying with pt and assisting as needed recently d/t pt's sciatic/back issues     Hand Dominance        Extremity/Trunk Assessment   Upper Extremity Assessment Upper Extremity Assessment: Generalized weakness    Lower Extremity Assessment Lower Extremity Assessment: Generalized weakness    Cervical / Trunk Assessment Cervical / Trunk Assessment: Other exceptions Cervical / Trunk Exceptions: forward head/shoulders  Communication   Communication: HOH  Cognition Arousal/Alertness: Awake/alert Behavior During Therapy: WFL for tasks assessed/performed Overall Cognitive Status: Within Functional Limits for tasks assessed                                          General Comments  Nursing cleared pt for participation in physical therapy.  Pt agreeable to PT session.   Exercises  Transfer training   Assessment/Plan    PT Assessment Patient needs continued PT services  PT Problem List Decreased strength;Decreased activity tolerance;Decreased balance;Decreased mobility;Decreased knowledge of use of DME;Cardiopulmonary status limiting activity;Pain       PT Treatment Interventions DME instruction;Gait training;Stair training;Functional mobility training;Therapeutic activities;Therapeutic exercise;Balance training;Patient/family education    PT  Goals (Current goals can be found in the Care Plan section)  Acute Rehab PT Goals Patient Stated Goal: to improve breathing and mobility PT Goal Formulation: With patient Time For Goal Achievement: 01/07/21 Potential to Achieve Goals: Good    Frequency Min 2X/week   Barriers to discharge        Co-evaluation               AM-PAC PT "6 Clicks" Mobility  Outcome Measure Help needed turning from your back to your side while in a flat bed without using bedrails?: None Help needed moving from lying on your back to sitting on the side of a flat bed without using bedrails?: A Little Help needed moving to and from a bed to a chair (including a wheelchair)?: A Little Help needed standing up from a chair using your arms (e.g., wheelchair or bedside chair)?: A Little Help needed to walk in hospital room?: A Little Help needed climbing 3-5 steps with a railing? : A Lot 6 Click Score: 18    End of Session Equipment Utilized During Treatment: Gait belt;Oxygen (2.5 L O2 via nasal cannula) Activity Tolerance: Patient limited by pain Patient left: in chair;with call bell/phone within reach;with chair alarm set Nurse Communication: Mobility status;Precautions PT Visit Diagnosis: Other abnormalities of gait and mobility (R26.89);Muscle weakness (generalized) (M62.81);History of falling (Z91.81);Pain    Time: 4008-6761 PT Time Calculation (min) (ACUTE ONLY): 35 min   Charges:  PT Evaluation $PT Eval Low Complexity: 1 Low PT Treatments $Therapeutic Activity: 8-22 mins       Leitha Bleak, PT 12/24/20, 5:16 PM

## 2020-12-24 NOTE — Progress Notes (Signed)
PROGRESS NOTE    Charles Ewing  YOV:[id number] DOB: [birth year] DOA: 12/22/2020 PCP: Olin Hauser, DO    Brief Narrative:  Charles Ewing is a 80 y.o. male with medical history significant for nicotine dependence hypertension, depression, coronary artery disease who presents to the emergency room via EMS for evaluation of worsening shortness of breath with exertion from his baseline associated with a cough productive of yellow phlegm. Patient states that he was diagnosed with COVID-19 viral infection about 10 days ago but did not receive any antiviral therapy. He states that his symptoms initially improved but over the last 3 days he developed worsening shortness of breath from his baseline associated with a productive cough and chest congestion.  He denies having any fever or chills presently but had that when he was diagnosed with the COVID-19 viral infection.  11/13 c/o not getting sleep at night. Feels sob improving. On 2.5 L 02  with satg 93%  Consultants:    Procedures:   Antimicrobials:  Ceftriaxone, remdesivir, azithromycin   Subjective: No chest pain, abdominal pain, dizziness  Objective: Vitals:   12/23/20 1939 12/24/20 0136 12/24/20 0554 12/24/20 0751  BP: 130/72 (!) 152/85 (!) 159/82 (!) 163/78  Pulse: 87 83 84 85  Resp: 18 20 16 20   Temp: 97.6 F (36.4 C) 98 F (36.7 C) 97.6 F (36.4 C) 98.8 F (37.1 C)  TempSrc: Oral Oral Oral   SpO2: 97% 94% 94% 93%  Weight:      Height:        Intake/Output Summary (Last 24 hours) at 12/24/2020 0816 Last data filed at 12/24/2020 0530 Gross per 24 hour  Intake 909.79 ml  Output 300 ml  Net 609.79 ml   Filed Weights   12/22/20 1159  Weight: 99.8 kg    Examination: Calm, NAD Decreased breath sounds increased expiratory time wheezing Regular S1-S2 no gallops Soft benign positive bowel sounds No edema aaoxox3   Data Reviewed: I have personally reviewed following labs and imaging  studies  CBC: Recent Labs  Lab 12/22/20 1200 12/23/20 0456  WBC 9.6 6.4  HGB 14.7 13.5  HCT 43.5 40.6  MCV 92.0 90.8  PLT 211 741   Basic Metabolic Panel: Recent Labs  Lab 12/22/20 1200 12/23/20 0456  NA 131* 132*  K 4.3 4.6  CL 97* 100  CO2 27 26  GLUCOSE 133* 182*  BUN 16 18  CREATININE 0.74 0.70  CALCIUM 8.3* 8.2*   GFR: Estimated Creatinine Clearance: 90.1 mL/min (by C-G formula based on SCr of 0.7 mg/dL). Liver Function Tests: No results for input(s): AST, ALT, ALKPHOS, BILITOT, PROT, ALBUMIN in the last 168 hours. No results for input(s): LIPASE, AMYLASE in the last 168 hours. No results for input(s): AMMONIA in the last 168 hours. Coagulation Profile: No results for input(s): INR, PROTIME in the last 168 hours. Cardiac Enzymes: No results for input(s): CKTOTAL, CKMB, CKMBINDEX, TROPONINI in the last 168 hours. BNP (last 3 results) No results for input(s): PROBNP in the last 8760 hours. HbA1C: No results for input(s): HGBA1C in the last 72 hours. CBG: Recent Labs  Lab 12/23/20 0750 12/23/20 1214 12/23/20 1617 12/23/20 1942 12/24/20 0753  GLUCAP 137* 129* 149* 166* 88   Lipid Profile: No results for input(s): CHOL, HDL, LDLCALC, TRIG, CHOLHDL, LDLDIRECT in the last 72 hours. Thyroid Function Tests: No results for input(s): TSH, T4TOTAL, FREET4, T3FREE, THYROIDAB in the last 72 hours. Anemia Panel: No results for input(s): VITAMINB12, FOLATE, FERRITIN, TIBC, IRON, RETICCTPCT  in the last 72 hours. Sepsis Labs: No results for input(s): PROCALCITON, LATICACIDVEN in the last 168 hours.  Recent Results (from the past 240 hour(s))  Resp Panel by RT-PCR (Flu A&B, Covid) Nasopharyngeal Swab     Status: Abnormal   Collection Time: 12/22/20  1:46 PM   Specimen: Nasopharyngeal Swab; Nasopharyngeal(NP) swabs in vial transport medium  Result Value Ref Range Status   SARS Coronavirus 2 by RT PCR POSITIVE (A) NEGATIVE Final    Comment: RESULT CALLED TO, READ  BACK BY AND VERIFIED WITH: JACKIE FERGUSON @1444  12/22/20 MJU (NOTE) SARS-CoV-2 target nucleic acids are DETECTED.  The SARS-CoV-2 RNA is generally detectable in upper respiratory specimens during the acute phase of infection. Positive results are indicative of the presence of the identified virus, but do not rule out bacterial infection or co-infection with other pathogens not detected by the test. Clinical correlation with patient history and other diagnostic information is necessary to determine patient infection status. The expected result is Negative.  Fact Sheet for Patients: EntrepreneurPulse.com.au  Fact Sheet for Healthcare Providers: IncredibleEmployment.be  This test is not yet approved or cleared by the [place] FDA and  has been authorized for detection and/or diagnosis of SARS-CoV-2 by FDA under an Emergency Use Authorization (EUA).  This EUA will remain in effect (meaning this test can b e used) for the duration of  the COVID-19 declaration under Section 564(b)(1) of the Act, 21 U.S.C. section 360bbb-3(b)(1), unless the authorization is terminated or revoked sooner.     Influenza A by PCR NEGATIVE NEGATIVE Final   Influenza B by PCR NEGATIVE NEGATIVE Final    Comment: (NOTE) The Xpert Xpress SARS-CoV-2/FLU/RSV plus assay is intended as an aid in the diagnosis of influenza from Nasopharyngeal swab specimens and should not be used as a sole basis for treatment. Nasal washings and aspirates are unacceptable for Xpert Xpress SARS-CoV-2/FLU/RSV testing.  Fact Sheet for Patients: EntrepreneurPulse.com.au  Fact Sheet for Healthcare Providers: IncredibleEmployment.be  This test is not yet approved or cleared by the [place] FDA and has been authorized for detection and/or diagnosis of SARS-CoV-2 by FDA under an Emergency Use Authorization (EUA). This EUA will remain in effect (meaning  this test can be used) for the duration of the COVID-19 declaration under Section 564(b)(1) of the Act, 21 U.S.C. section 360bbb-3(b)(1), unless the authorization is terminated or revoked.  Performed at [facility], [address] [place], [place] [address]          Radiology Studies: DG Chest 2 View  Result Date: 12/22/2020 CLINICAL DATA:  Shortness of breath EXAM: CHEST - 2 VIEW COMPARISON:  Radiograph 02/28/2015 FINDINGS: Unchanged cardiomediastinal silhouette. There is no focal airspace consolidation. There is no pleural effusion or visible pneumothorax. There is no acute osseous abnormality. Thoracic spondylosis. IMPRESSION: No evidence of acute cardiopulmonary disease. Electronically Signed   By: Maurine Simmering M.D.   On: 12/22/2020 12:27   ECHOCARDIOGRAM COMPLETE  Result Date: 12/23/2020    ECHOCARDIOGRAM REPORT   Patient Name:   CHAPMAN MATTEUCCI Date of Exam: 12/23/2020 Medical Rec #:  [id number]      Height:       72.0 in Accession #:    [id number]     Weight:       220.0 lb Date of Birth:  1940/06/12     BSA:          2.219 m Patient Age:    32 years       BP:  119/65 mmHg Patient Gender: M              HR:           92 bpm. 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                  CAD; Risk Factors:Hypertension.  Sonographer:     Alyse Low Roar Referring Phys:  [place] Diagnosing Phys: Serafina Royals MD IMPRESSIONS  1. Left ventricular ejection fraction, by estimation, is 60 to 65%. The left ventricle has normal function. The left ventricle has no regional wall motion abnormalities. Left ventricular diastolic parameters were normal.  2. Right ventricular systolic function is normal. The right ventricular size is normal.  3. The mitral valve is normal in structure. Trivial mitral valve  regurgitation.  4. The aortic valve is normal in structure. Aortic valve regurgitation is not visualized. FINDINGS  Left Ventricle: Left ventricular ejection fraction, by estimation, is 60 to 65%. The left ventricle has normal function. The left ventricle has no regional wall motion abnormalities. The left ventricular internal cavity size was small. There is no left ventricular hypertrophy. Left ventricular diastolic parameters were normal. Right Ventricle: The right ventricular size is normal. No increase in right ventricular wall thickness. Right ventricular systolic function is normal. Left Atrium: Left atrial size was normal in size. Right Atrium: Right atrial size was normal in size. Pericardium: There is no evidence of pericardial effusion. Mitral Valve: The mitral valve is normal in structure. Trivial mitral valve regurgitation. Tricuspid Valve: The tricuspid valve is normal in structure. Tricuspid valve regurgitation is trivial. Aortic Valve: The aortic valve is normal in structure. Aortic valve regurgitation is not visualized. Aortic valve peak gradient measures 4.0 mmHg. Pulmonic Valve: The pulmonic valve was normal in structure. Pulmonic valve regurgitation is not visualized. Aorta: The aortic root and ascending aorta are structurally normal, with no evidence of dilitation. IAS/Shunts: No atrial level shunt detected by color flow Doppler.  LEFT VENTRICLE PLAX 2D LVIDd:         4.60 cm LVIDs:         3.70 cm LV PW:         1.10 cm LV IVS:        1.10 cm LVOT diam:     1.80 cm LVOT Area:     2.54 cm  LEFT ATRIUM         Index LA diam:    3.00 cm 1.35 cm/m  AORTIC VALVE                 PULMONIC VALVE AV Area (Vmax): 2.02 cm     PV Vmax:        0.75 m/s AV Vmax:        100.00 cm/s  PV Peak grad:   2.2 mmHg AV Peak Grad:   4.0 mmHg     RVOT Peak grad: 3 mmHg LVOT Vmax:      79.30 cm/s  AORTA Ao Root diam: 3.00 cm MITRAL VALVE               TRICUSPID VALVE MV Area (PHT): 3.02 cm    TR Peak grad:   25.2 mmHg  MV Decel Time: 251 msec    TR Vmax:  251.00 cm/s MV E velocity: 60.40 cm/s MV A velocity: 88.70 cm/s  SHUNTS MV E/A ratio:  0.68        Systemic Diam: 1.80 cm Serafina Royals MD Electronically signed by Serafina Royals MD Signature Date/Time: 12/23/2020/1:14:05 PM    Final         Scheduled Meds:  albuterol  2 puff Inhalation Q6H   aspirin EC  81 mg Oral QPM   enoxaparin (LOVENOX) injection  40 mg Subcutaneous Q24H   FLUoxetine  20 mg Oral BID   insulin aspart  0-15 Units Subcutaneous TID WC   insulin detemir  0.075 Units/kg Subcutaneous BID   loratadine  10 mg Oral QPM   LORazepam  0.5 mg Oral QHS   multivitamin with minerals  1 tablet Oral Daily   pantoprazole  40 mg Oral Daily   predniSONE  40 mg Oral Q breakfast   simvastatin  20 mg Oral QHS   sucralfate  1 g Oral TID WC & HS   tamsulosin  0.4 mg Oral QPC breakfast   zinc sulfate  220 mg Oral Daily   Continuous Infusions:  azithromycin Stopped (12/23/20 1351)   cefTRIAXone (ROCEPHIN)  IV Stopped (12/23/20 1724)   remdesivir 100 mg in NS 100 mL Stopped (12/23/20 1238)    Assessment & Plan:   Principal Problem:   COPD with acute exacerbation ([place]) Active Problems:   Benign prostatic hyperplasia with urinary obstruction   Coronary artery disease   Depression   Hypertension   COVID-19 virus infection   Nicotine dependence   Cardiomyopathy [facility])   Patient is an 80 year old male who was diagnosed with COVID-19 viral infection about 10 days ago.  He presents to the ER for evaluation of worsening shortness of breath, productive cough and wheezing consistent with COPD exacerbation.       Acute COPD exacerbation Patient with a history of COPD and nicotine dependence and COVID-positive infection 11/13 clinically improving but not at baseline Currently on 2.5 L nasal cannula Continue antibiotics Continue I-S and inhalers Flutter valve         COVID-19 viral infection Patient's initial positive test was over 10  days ago His CT level is 23.1 consistent with an active infection Continue supportive care 11/13 continue remdesivir Continue systemic steroids as above         Cardiomyopathy Last known LVEF from a 2D echocardiogram done in 2018 was 45% holding metoprolol, amlodipine, losartan, furosemide due to relative hypotension  May need to use Lasix as needed since he is on steroids  11/13 echo was read as normal EF and diastolic function.  Echo personally reviewed patient with diastolic stage I abnormality Resume amlodipine Resume Lasix 20 mg daily was holding         Depression Continue fluoxetine     Nicotine dependence Smoking counseling was done  Patient declined nicotine patch    Essential hypertension Initially BP meds were held due to hypotension Will resume Lasix and amlodipine since BP now elevated    GERD Continue PPI       BPH Continue Flomax   DVT prophylaxis: Lovenox Code Status: DNR Family Communication: None at bedside Disposition Plan: Echo Status is: Inpatient  Remains inpatient appropriate because: Due to requirement of IV treatment and severity of his illness.            LOS: 2 days   Time spent: 45 minutes with more than 50% on COC    Nolberto Hanlon, MD [facility] Pager  [phone]  If 7PM-7AM, please contact night-coverage 12/24/2020, 8:16 AM

## 2020-12-25 DIAGNOSIS — J441 Chronic obstructive pulmonary disease with (acute) exacerbation: Secondary | ICD-10-CM | POA: Diagnosis not present

## 2020-12-25 LAB — GLUCOSE, CAPILLARY
Glucose-Capillary: 100 mg/dL — ABNORMAL HIGH (ref 70–99)
Glucose-Capillary: 110 mg/dL — ABNORMAL HIGH (ref 70–99)
Glucose-Capillary: 164 mg/dL — ABNORMAL HIGH (ref 70–99)
Glucose-Capillary: 122 mg/dL — ABNORMAL HIGH (ref 70–99)

## 2020-12-25 LAB — POTASSIUM: Potassium: 4 mmol/L (ref 3.5–5.1)

## 2020-12-25 LAB — C-REACTIVE PROTEIN: CRP: 5.2 mg/dL — ABNORMAL HIGH (ref <1.0)

## 2020-12-25 LAB — D-DIMER, QUANTITATIVE: D-Dimer, Quant: 0.88 ug/mL-FEU — ABNORMAL HIGH (ref 0.00–0.50)

## 2020-12-25 MED ORDER — GUAIFENESIN ER 600 MG PO TB12
600.0000 mg | ORAL_TABLET | Freq: Two times a day (BID) | ORAL | Status: DC
  Administered 2020-12-25 – 2020-12-27 (×5): 600 mg via ORAL
  Filled 2020-12-25 (×5): qty 1

## 2020-12-25 MED ORDER — LOSARTAN POTASSIUM 25 MG PO TABS
25.0000 mg | ORAL_TABLET | Freq: Every day | ORAL | Status: DC
  Administered 2020-12-25 – 2020-12-27 (×3): 25 mg via ORAL
  Filled 2020-12-25 (×3): qty 1

## 2020-12-25 NOTE — Progress Notes (Signed)
Physical Therapy Treatment Patient Details Name: Charles Ewing MRN: [id number] DOB: Jun 24, 1940 Today's Date: 12/25/2020   History of Present Illness Pt is an 80 y.o. male presenting to hospital 12/22/20 with worsening SOB (diagnosed with COVID-19 viral infection about 10 days ago but did not receive antiviral therapy).  Pt admitted with acute COPD exacerbation, COVID-19 viral infection, and cardiomyopathy.  PMH includes AAA, anxiety, COPD, htn, skin CA, PTSD, B LE edema, CAD, orthostatic hypotension.    PT Comments    Pt received supine in bed, sleeping however easily aroused. He initially stated he was having a bad day, had spent most of the day in the recliner and was not feeling up to PT. He then stated he felt "wet". PT discovered pt had sweat through all bed linens, blankets and gown. Pt was agreeable to standing as PT changed bed linens. He sat EOB for about 10 minutes as he told stories and stood for 5 minutes as PT changed linens. He performed bed mobility with SUP however he did require multiple attempts and minimal assist to stand from lowered EOB. PT assisted RN by taking vitals at end of session. RN aware of profuse sweating. Would benefit from skilled PT to address above deficits and promote optimal return to PLOF.   Recommendations for follow up therapy are one component of a multi-disciplinary discharge planning process, led by the attending physician.  Recommendations may be updated based on patient status, additional functional criteria and insurance authorization.  Follow Up Recommendations  Home health PT     Assistance Recommended at Discharge Intermittent Supervision/Assistance  Equipment Recommendations  Rolling walker (2 wheels);BSC/3in1    Recommendations for Other Services       Precautions / Restrictions Precautions Precautions: Fall Restrictions Weight Bearing Restrictions: No     Mobility  Bed Mobility Overal bed mobility: Needs Assistance Bed  Mobility: Supine to Sit;Sit to Supine     Supine to sit: Supervision;HOB elevated Sit to supine: Supervision   General bed mobility comments: Increased time and effort; PT managed lines    Transfers Overall transfer level: Needs assistance Equipment used: Rolling walker (2 wheels) Transfers: Sit to/from Stand Sit to Stand: Min assist           General transfer comment: Minimal lifting assist to stand from EOB - multiple attempts prior to success and pt using momentum to stand    Ambulation/Gait               General Gait Details: Not performed - pt states he is not up to walking today   Stairs             Wheelchair Mobility    Modified Rankin (Stroke Patients Only)       Balance Overall balance assessment: Needs assistance Sitting-balance support: No upper extremity supported;Feet supported Sitting balance-Leahy Scale: Good Sitting balance - Comments: steady sitting during functional tasks such as dressing   Standing balance support: Bilateral upper extremity supported Standing balance-Leahy Scale: Fair Standing balance comment: Static standing for 5 minutes, pt did not rely on RW however held it for pt comfort                            Cognition Arousal/Alertness: Awake/alert Behavior During Therapy: WFL for tasks assessed/performed Overall Cognitive Status: Within Functional Limits for tasks assessed  Exercises Other Exercises Other Exercises: Prolonged sitting and standing as PT assisted with gown and linen change. Pt also laterally scooted EOB with SUP.    General Comments        Pertinent Vitals/Pain Pain Assessment: No/denies pain    Home Living                          Prior Function            PT Goals (current goals can now be found in the care plan section) Acute Rehab PT Goals Patient Stated Goal: to improve breathing and mobility PT Goal  Formulation: With patient Time For Goal Achievement: 01/07/21 Potential to Achieve Goals: Good    Frequency    Min 2X/week      PT Plan      Co-evaluation              AM-PAC PT "6 Clicks" Mobility   Outcome Measure  Help needed turning from your back to your side while in a flat bed without using bedrails?: None Help needed moving from lying on your back to sitting on the side of a flat bed without using bedrails?: A Little Help needed moving to and from a bed to a chair (including a wheelchair)?: A Little Help needed standing up from a chair using your arms (e.g., wheelchair or bedside chair)?: A Little Help needed to walk in hospital room?: A Little Help needed climbing 3-5 steps with a railing? : A Lot 6 Click Score: 18    End of Session Equipment Utilized During Treatment: Gait belt;Oxygen (2.5 L O2 via nasal cannula) Activity Tolerance: Patient limited by fatigue Patient left: in bed;with call bell/phone within reach;with bed alarm set Nurse Communication: Mobility status;Precautions PT Visit Diagnosis: Other abnormalities of gait and mobility (R26.89);Muscle weakness (generalized) (M62.81);History of falling (Z91.81);Pain     Time: 1610-9604 PT Time Calculation (min) (ACUTE ONLY): 32 min  Charges:  $Therapeutic Activity: 23-37 mins                     Patrina Levering PT, DPT 12/25/20 2:26 PM [phone]

## 2020-12-25 NOTE — Progress Notes (Signed)
PROGRESS NOTE    Charles Ewing  TKP:[id number] DOB: [birth year] DOA: 12/22/2020 PCP: Olin Hauser, DO    Brief Narrative:  Charles Ewing is a 80 y.o. male with medical history significant for nicotine dependence hypertension, depression, coronary artery disease who presents to the emergency room via EMS for evaluation of worsening shortness of breath with exertion from his baseline associated with a cough productive of yellow phlegm. Patient states that he was diagnosed with COVID-19 viral infection about 10 days ago but did not receive any antiviral therapy. He states that his symptoms initially improved but over the last 3 days he developed worsening shortness of breath from his baseline associated with a productive cough and chest congestion.  He denies having any fever or chills presently but had that when he was diagnosed with the COVID-19 viral infection.  11/13 c/o not getting sleep at night. Feels sob improving. On 2.5 L 02  with satg 93% 11/14 still sob, but improving. On 2 L nasal cannula at 89%  Consultants:    Procedures:   Antimicrobials:  Ceftriaxone, remdesivir, azithromycin   Subjective: Chest pain or abdominal pain  Objective: Vitals:   12/25/20 0047 12/25/20 0500 12/25/20 0757 12/25/20 1133  BP: (!) 160/96 (!) 152/85 (!) 162/88 (!) 143/84  Pulse: (!) 101 94 92 85  Resp: 16 19 20 20   Temp: 97.9 F (36.6 C) (!) 97.5 F (36.4 C)  98 F (36.7 C)  TempSrc:  Oral    SpO2: 94% 94% 92% (!) 89%  Weight:      Height:        Intake/Output Summary (Last 24 hours) at 12/25/2020 1318 Last data filed at 12/25/2020 0500 Gross per 24 hour  Intake 480 ml  Output 1100 ml  Net -620 ml   Filed Weights   12/22/20 1159  Weight: 99.8 kg    Examination: Calm, NAD Decreased breath sounds no wheezing Regular S1-S2 no gallops Soft benign positive bowel sounds Bilaterally extremity edema Awake and alert and oriented x3   Data Reviewed: I have  personally reviewed following labs and imaging studies  CBC: Recent Labs  Lab 12/22/20 1200 12/23/20 0456  WBC 9.6 6.4  HGB 14.7 13.5  HCT 43.5 40.6  MCV 92.0 90.8  PLT 211 517   Basic Metabolic Panel: Recent Labs  Lab 12/22/20 1200 12/23/20 0456 12/25/20 0415  NA 131* 132*  --   K 4.3 4.6 4.0  CL 97* 100  --   CO2 27 26  --   GLUCOSE 133* 182*  --   BUN 16 18  --   CREATININE 0.74 0.70  --   CALCIUM 8.3* 8.2*  --    GFR: Estimated Creatinine Clearance: 90.1 mL/min (by C-G formula based on SCr of 0.7 mg/dL). Liver Function Tests: No results for input(s): AST, ALT, ALKPHOS, BILITOT, PROT, ALBUMIN in the last 168 hours. No results for input(s): LIPASE, AMYLASE in the last 168 hours. No results for input(s): AMMONIA in the last 168 hours. Coagulation Profile: No results for input(s): INR, PROTIME in the last 168 hours. Cardiac Enzymes: No results for input(s): CKTOTAL, CKMB, CKMBINDEX, TROPONINI in the last 168 hours. BNP (last 3 results) No results for input(s): PROBNP in the last 8760 hours. HbA1C: No results for input(s): HGBA1C in the last 72 hours. CBG: Recent Labs  Lab 12/24/20 1211 12/24/20 1707 12/24/20 2055 12/25/20 0756 12/25/20 1132  GLUCAP 103* 132* 133* 100* 110*   Lipid Profile: No results for input(s):  CHOL, HDL, LDLCALC, TRIG, CHOLHDL, LDLDIRECT in the last 72 hours. Thyroid Function Tests: No results for input(s): TSH, T4TOTAL, FREET4, T3FREE, THYROIDAB in the last 72 hours. Anemia Panel: No results for input(s): VITAMINB12, FOLATE, FERRITIN, TIBC, IRON, RETICCTPCT in the last 72 hours. Sepsis Labs: No results for input(s): PROCALCITON, LATICACIDVEN in the last 168 hours.  Recent Results (from the past 240 hour(s))  Resp Panel by RT-PCR (Flu A&B, Covid) Nasopharyngeal Swab     Status: Abnormal   Collection Time: 12/22/20  1:46 PM   Specimen: Nasopharyngeal Swab; Nasopharyngeal(NP) swabs in vial transport medium  Result Value Ref Range  Status   SARS Coronavirus 2 by RT PCR POSITIVE (A) NEGATIVE Final    Comment: RESULT CALLED TO, READ BACK BY AND VERIFIED WITH: JACKIE FERGUSON @1444  12/22/20 MJU (NOTE) SARS-CoV-2 target nucleic acids are DETECTED.  The SARS-CoV-2 RNA is generally detectable in upper respiratory specimens during the acute phase of infection. Positive results are indicative of the presence of the identified virus, but do not rule out bacterial infection or co-infection with other pathogens not detected by the test. Clinical correlation with patient history and other diagnostic information is necessary to determine patient infection status. The expected result is Negative.  Fact Sheet for Patients: EntrepreneurPulse.com.au  Fact Sheet for Healthcare Providers: IncredibleEmployment.be  This test is not yet approved or cleared by the [place] FDA and  has been authorized for detection and/or diagnosis of SARS-CoV-2 by FDA under an Emergency Use Authorization (EUA).  This EUA will remain in effect (meaning this test can b e used) for the duration of  the COVID-19 declaration under Section 564(b)(1) of the Act, 21 U.S.C. section 360bbb-3(b)(1), unless the authorization is terminated or revoked sooner.     Influenza A by PCR NEGATIVE NEGATIVE Final   Influenza B by PCR NEGATIVE NEGATIVE Final    Comment: (NOTE) The Xpert Xpress SARS-CoV-2/FLU/RSV plus assay is intended as an aid in the diagnosis of influenza from Nasopharyngeal swab specimens and should not be used as a sole basis for treatment. Nasal washings and aspirates are unacceptable for Xpert Xpress SARS-CoV-2/FLU/RSV testing.  Fact Sheet for Patients: EntrepreneurPulse.com.au  Fact Sheet for Healthcare Providers: IncredibleEmployment.be  This test is not yet approved or cleared by the [place] FDA and has been authorized for detection and/or diagnosis of  SARS-CoV-2 by FDA under an Emergency Use Authorization (EUA). This EUA will remain in effect (meaning this test can be used) for the duration of the COVID-19 declaration under Section 564(b)(1) of the Act, 21 U.S.C. section 360bbb-3(b)(1), unless the authorization is terminated or revoked.  Performed at [facility], [address], [place], [place] [address]          Radiology Studies: No results found.      Scheduled Meds:  albuterol  2 puff Inhalation Q6H   amLODipine  5 mg Oral Daily   aspirin EC  81 mg Oral QPM   enoxaparin (LOVENOX) injection  40 mg Subcutaneous Q24H   FLUoxetine  20 mg Oral BID   furosemide  20 mg Oral Daily   insulin aspart  0-15 Units Subcutaneous TID WC   loratadine  10 mg Oral QPM   LORazepam  0.5 mg Oral QHS   losartan  25 mg Oral Daily   multivitamin with minerals  1 tablet Oral Daily   pantoprazole  40 mg Oral Daily   predniSONE  40 mg Oral Q breakfast   simvastatin  20 mg Oral QHS  sucralfate  1 g Oral TID WC & HS   tamsulosin  0.4 mg Oral QPC breakfast   zinc sulfate  220 mg Oral Daily   Continuous Infusions:  azithromycin 500 mg (12/25/20 1032)   cefTRIAXone (ROCEPHIN)  IV 1 g (12/24/20 1726)   remdesivir 100 mg in NS 100 mL 100 mg (12/25/20 0855)    Assessment & Plan:   Principal Problem:   COPD with acute exacerbation ([place]) Active Problems:   Benign prostatic hyperplasia with urinary obstruction   Coronary artery disease   Depression   Hypertension   COVID-19 virus infection   Nicotine dependence   Cardiomyopathy [facility])   Patient is an 80 year old male who was diagnosed with COVID-19 viral infection about 10 days ago.  He presents to the ER for evaluation of worsening shortness of breath, productive cough and wheezing consistent with COPD exacerbation.       Acute COPD exacerbation Patient with a history of COPD and nicotine dependence and COVID-positive infection 11/14 still symptomatic but improving,  not at baseline Continue antibiotics Add Mucinex Continue I-S and inhalers Continue flutter valve          COVID-19 viral infection Patient's initial positive test was over 10 days ago His CT level is 23.1 consistent with an active infection 11/14 continue remdesivir  Continue steroids         Cardiomyopathy Last known LVEF from a 2D echocardiogram done in 2018 was 45% holding metoprolol, amlodipine, losartan, furosemide due to relative hypotension  May need to use Lasix as needed since he is on steroids  11/13 echo was read as normal EF and diastolic function.  Echo personally reviewed patient with diastolic stage I abnormality 11/14 we will resume losartan home dose as his BP is elevated Continue Lasix         Depression Continue fluoxetine     Nicotine dependence Smoking counseling was done  Patient declined nicotine patch    Essential hypertension Initially BP meds were held due to hypotension Will resume losartan Continue amlodipine   GERD Continue PPI       BPH Continue Flomax   DVT prophylaxis: Lovenox Code Status: DNR Family Communication: None at bedside Disposition Plan: Echo Status is: Inpatient  Remains inpatient appropriate because: Due to requirement of IV treatment and severity of his illness.            LOS: 3 days   Time spent: 35 minutes with more than 50% on [place], MD [facility] Pager [phone]  If 7PM-7AM, please contact night-coverage 12/25/2020, 1:18 PM

## 2020-12-25 NOTE — TOC Initial Note (Signed)
Transition of Care [facility]) - Initial/Assessment Note    Patient Details  Name: Charles Ewing MRN: [id number] Date of Birth: 01/16/1941  Transition of Care [facility]) CM/SW Contact:    Shelbie Hutching, RN Phone Number: 12/25/2020, 12:03 PM  Clinical Narrative:                 Patient admitted to the hospital with COVID currently requiring acute O2 at 2L.  RNCM attempted to contact the patient via phone but no answer.  Attempted to contact son but wrong number.  PT is recommending HH, will attempt to reach out again this afternoon.  Patient will likely need oxygen set up at home.  Potential for discharge tomorrow.   Patient is from home, PT note reports that patient's son has been staying with him recently.    Expected Discharge Plan: [place] Barriers to Discharge: Continued Medical Work up   Patient Goals and CMS Choice   CMS Medicare.gov Compare Post Acute Care list provided to:: Patient Choice offered to / list presented to : Patient  Expected Discharge Plan and Services Expected Discharge Plan: [place]   Discharge Planning Services: CM Consult Post Acute Care Choice: [place] arrangements for the past 2 months: Single Family Home                                      Prior Living Arrangements/Services Living arrangements for the past 2 months: Single Family Home Lives with:: Self Patient language and need for interpreter reviewed:: Yes              Criminal Activity/Legal Involvement Pertinent to Current Situation/Hospitalization: No - Comment as needed  Activities of Daily Living Home Assistive Devices/Equipment: None ADL Screening (condition at time of admission) Patient's cognitive ability adequate to safely complete daily activities?: Yes Is the patient deaf or have difficulty hearing?: No Does the patient have difficulty seeing, even when wearing glasses/contacts?: No Does the patient have difficulty  concentrating, remembering, or making decisions?: No Patient able to express need for assistance with ADLs?: Yes Does the patient have difficulty dressing or bathing?: No Independently performs ADLs?: Yes (appropriate for developmental age) Does the patient have difficulty walking or climbing stairs?: No Weakness of Legs: Both Weakness of Arms/Hands: Both  Permission Sought/Granted                  Emotional Assessment       Orientation: : Oriented to Self, Oriented to Place, Oriented to  Time, Oriented to Situation Alcohol / Substance Use: Not Applicable Psych Involvement: No (comment)  Admission diagnosis:  Shortness of breath [R06.02] COPD exacerbation ([place]) [J44.1] Acute respiratory failure with hypoxia ([place]) [J96.01] COPD with acute exacerbation ([place]) [J44.1] Patient Active Problem List   Diagnosis Date Noted   COPD with acute exacerbation ([place]) 12/22/2020   Depression    Hypertension    COVID-19 virus infection    Nicotine dependence    Cardiomyopathy ([place])    PTSD (post-traumatic stress disorder) 11/06/2020   Venous insufficiency of both lower extremities 09/04/2020   Bilateral lower extremity edema 09/04/2020   Irregular heart rhythm 08/01/2016   Slow transit constipation 04/17/2016   Anxiety 04/17/2016   Insomnia 04/17/2016   Osteoarthritis of knees, 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   SOB (shortness of breath) on exertion 12/13/2015   Left sided chest pain 12/11/2015   Coronary  artery disease    Benign hypertension with CKD (chronic kidney disease) stage III ([place]) 11/17/2015   Abdominal aortic aneurysm (AAA) 06/12/2015   Prediabetes 05/15/2015   Hyperlipidemia 05/15/2015   Centrilobular emphysema ([place]) 05/15/2015   Orthostatic hypotension 02/28/2015   Elevated prostate specific antigen (PSA) 04/14/2012   Benign prostatic hyperplasia with urinary obstruction 04/14/2012   False passage of urethra 04/14/2012   PCP:  Olin Hauser, DO Pharmacy:   [place] [facility], [place] [address] Phone: [phone] Fax: [phone]     Social Determinants of Health (SDOH) Interventions    Readmission Risk Interventions No flowsheet data found.

## 2020-12-25 NOTE — Progress Notes (Signed)
Occupational Therapy Evaluation Patient Details Name: Charles Ewing MRN: [id number] DOB: 1940-12-12 Today's Date: 12/25/2020   History of Present Illness Pt is an 80 y.o. male presenting to hospital 12/22/20 with worsening SOB (diagnosed with COVID-19 viral infection about 10 days ago but did not receive antiviral therapy).  Pt admitted with acute COPD exacerbation, COVID-19 viral infection, and cardiomyopathy.  PMH includes AAA, anxiety, COPD, htn, skin CA, PTSD, B LE edema, CAD, orthostatic hypotension.   Clinical Impression   Charles Ewing was seen for OT evaluation this date. Prior to hospital admission, pt was independent but son has been assisting him 2/2 back pain. Pt lives alone in a mobile home.  Pt reports h/o dizziness 2/2 medications, none reported this session. Currently pt demonstrates impairments as described below (See OT problem list) which functionally limit his ability to perform ADL/self-care tasks. Pt currently requires SUP for LBD, don socks, seated EOB, ~ 5 min. SBA + RW for functional reaching activity at chest height, standing, ~ 3 min. SUP for simulated toilet t/f. Pt reports SOB limiting his activities, resolved with sitting. Pt would benefit from skilled OT services to address noted impairments and functional limitations (see below for any additional details) in order to maximize safety and independence while minimizing falls risk and caregiver burden. Upon hospital discharge, recommend no follow up OT.      Recommendations for follow up therapy are one component of a multi-disciplinary discharge planning process, led by the attending physician.  Recommendations may be updated based on patient status, additional functional criteria and insurance authorization.   Follow Up Recommendations  No OT follow up    Assistance Recommended at Discharge Set up Supervision/Assistance  Functional Status Assessment  Patient has had a recent decline in their functional status and  demonstrates the ability to make significant improvements in function in a reasonable and predictable amount of time.  Equipment Recommendations  BSC/3in1;Tub/shower seat;Other (comment) (RW)    Recommendations for Other Services       Precautions / Restrictions Precautions Precautions: Fall Restrictions Weight Bearing Restrictions: No      Mobility Bed Mobility Overal bed mobility: Needs Assistance Bed Mobility: Supine to Sit     Supine to sit: Supervision;HOB elevated          Transfers Overall transfer level: Needs assistance Equipment used: Rolling walker (2 wheels) Transfers: Sit to/from Stand Sit to Stand: Supervision                  Balance Overall balance assessment: Needs assistance Sitting-balance support: No upper extremity supported;Feet supported Sitting balance-Leahy Scale: Good     Standing balance support: No upper extremity supported;During functional activity Standing balance-Leahy Scale: Fair                             ADL either performed or assessed with clinical judgement   ADL Overall ADL's : Needs assistance/impaired                                       General ADL Comments: SUP for LBD, don socks, seated EOB. SBA + RW for functional reaching activity at chest height, standing. SUP for simulated toilet t/f.      Pertinent Vitals/Pain Pain Assessment: 0-10 Pain Score: 6  Pain Location: sciatic/low back pain Pain Descriptors / Indicators: Aching;Sharp;Shooting Pain Intervention(s): Limited activity within patient's  tolerance;Repositioned     Hand Dominance     Extremity/Trunk Assessment Upper Extremity Assessment Upper Extremity Assessment: Overall WFL for tasks assessed   Lower Extremity Assessment Lower Extremity Assessment: Overall WFL for tasks assessed       Communication Communication Communication: HOH   Cognition Arousal/Alertness: Awake/alert Behavior During Therapy: WFL for  tasks assessed/performed Overall Cognitive Status: Within Functional Limits for tasks assessed                                       General Comments       Exercises Exercises: Other exercises Other Exercises Other Exercises: Pt educ re: OT roles, falls prevention, ECS, spriometer use Other Exercises: Sup>sit, sit<>stand, LBD, functional reaching task, pt left in chair   Shoulder Instructions      Home Living Family/patient expects to be discharged to:: Private residence Living Arrangements: Alone Available Help at Discharge: Family Type of Home: Mobile home Home Access: Stairs to enter Technical brewer of Steps: 3 Entrance Stairs-Rails: Right;Left;Can reach both Home Layout: One level     Bathroom Shower/Tub: Teacher, early years/pre: Standard                Prior Functioning/Environment Prior Level of Function : Independent/Modified Independent;History of Falls (last six months)             Mobility Comments: H/o falls (3-4 in past 6 months) ADLs Comments: Pt's son has been staying with pt and assisting as needed recently d/t pt's sciatic/back issues        OT Problem List: Decreased activity tolerance;Impaired balance (sitting and/or standing);Decreased knowledge of use of DME or AE      OT Treatment/Interventions: Self-care/ADL training;Therapeutic exercise;Energy conservation;Therapeutic activities;DME and/or AE instruction    OT Goals(Current goals can be found in the care plan section) Acute Rehab OT Goals Patient Stated Goal: to go home OT Goal Formulation: With patient Time For Goal Achievement: 01/08/21 Potential to Achieve Goals: Good ADL Goals Pt Will Transfer to Toilet: with modified independence;regular height toilet;ambulating (w/ LRAD prn) Pt/caregiver will Perform Home Exercise Program: Increased ROM;Increased strength;Both right and left upper extremity;With minimal assist Additional ADL Goal #1: Pt will  verbalize plan to implement 3/3 energy conservation strategies.  OT Frequency: Min 2X/week   Barriers to D/C:            Co-evaluation              AM-PAC OT "6 Clicks" Daily Activity     Outcome Measure Help from another person eating meals?: None Help from another person taking care of personal grooming?: A Little Help from another person toileting, which includes using toliet, bedpan, or urinal?: A Little Help from another person bathing (including washing, rinsing, drying)?: A Little Help from another person to put on and taking off regular upper body clothing?: None Help from another person to put on and taking off regular lower body clothing?: A Little 6 Click Score: 20   End of Session Equipment Utilized During Treatment: Gait belt;Rolling walker (2 wheels)  Activity Tolerance: Patient tolerated treatment well (Pt limited by SOB) Patient left: in chair;with call bell/phone within reach;with chair alarm set  OT Visit Diagnosis: Unsteadiness on feet (R26.81);Other abnormalities of gait and mobility (R26.89);History of falling (Z91.81)                Time: 2956-2130 OT Time Calculation (min):  27 min Charges:  OT General Charges $OT Visit: 1 Visit OT Evaluation $OT Eval Low Complexity: 1 Low OT Treatments $Self Care/Home Management : 8-22 mins  Nino Glow, Markus Daft 12/25/2020, 10:52 AM

## 2020-12-26 ENCOUNTER — Inpatient Hospital Stay: Payer: Medicare Other

## 2020-12-26 DIAGNOSIS — J441 Chronic obstructive pulmonary disease with (acute) exacerbation: Secondary | ICD-10-CM | POA: Diagnosis not present

## 2020-12-26 LAB — D-DIMER, QUANTITATIVE: D-Dimer, Quant: 0.91 ug/mL-FEU — ABNORMAL HIGH (ref 0.00–0.50)

## 2020-12-26 LAB — GLUCOSE, CAPILLARY
Glucose-Capillary: 95 mg/dL (ref 70–99)
Glucose-Capillary: 160 mg/dL — ABNORMAL HIGH (ref 70–99)
Glucose-Capillary: 134 mg/dL — ABNORMAL HIGH (ref 70–99)
Glucose-Capillary: 129 mg/dL — ABNORMAL HIGH (ref 70–99)

## 2020-12-26 LAB — C-REACTIVE PROTEIN: CRP: 2.7 mg/dL — ABNORMAL HIGH (ref <1.0)

## 2020-12-26 LAB — BRAIN NATRIURETIC PEPTIDE: B Natriuretic Peptide: 43.5 pg/mL (ref 0.0–100.0)

## 2020-12-26 MED ORDER — METHYLPREDNISOLONE SODIUM SUCC 40 MG IJ SOLR
40.0000 mg | Freq: Every day | INTRAMUSCULAR | Status: DC
  Administered 2020-12-27: 09:00:00 40 mg via INTRAVENOUS
  Filled 2020-12-26: qty 1

## 2020-12-26 NOTE — Progress Notes (Signed)
SATURATION QUALIFICATIONS: (This note is used to comply with regulatory documentation for home oxygen)  Patient Saturations on Room Air at Rest = 94 %  Patient Saturations on Room Air while Ambulating = 91%  Patient Saturations on 0 Liters of oxygen while Ambulating = 0%  Please briefly explain why patient needs home oxygen: Patient able to ambulate without a decrease in O2 sats.

## 2020-12-26 NOTE — TOC Progression Note (Signed)
Transition of Care [facility]) - Progression Note    Patient Details  Name: ODAY RIDINGS MRN: [id number] Date of Birth: Apr 13, 1940  [facility] CM/SW Contact  Shelbie Hutching, RN Phone Number: 12/26/2020, 1:11 PM  Clinical Narrative:    RNCM was able to speak with patient via phone today.  Patient is in good spirits and would like to be able to go home tomorrow.  Patient will likely need home O2, he agrees to having oxygen set up with [employer]Marland Kitchen  He also agrees to having home health set up, he has used [facility] and would like to use them again.   Patient is current with his PCP and uses [place] for prescriptions.  Patient's son, who has been staying with him will be able to pick him up at discharge.     Expected Discharge Plan: [place] Barriers to Discharge: Continued Medical Work up  Expected Discharge Plan and Services Expected Discharge Plan: [place]   Discharge Planning Services: CM Consult Post Acute Care Choice: [place] arrangements for the past 2 months: Single Family Home                 DME Arranged: Oxygen         HH Arranged: Therapist, sports, PT, OT [place] Agency: [place] ([place]) Date [place]: 12/26/20 Time [place]: 1310 Representative spoke with at [place]: [place] ([place]) Interventions    Readmission Risk Interventions No flowsheet data found.

## 2020-12-26 NOTE — Progress Notes (Addendum)
PROGRESS NOTE    Charles Ewing  GGY:[id number] DOB: [birth year] DOA: 12/22/2020 PCP: Olin Hauser, DO    Brief Narrative:  Charles Ewing is a 80 y.o. male with medical history significant for nicotine dependence hypertension, depression, coronary artery disease who presents to the emergency room via EMS for evaluation of worsening shortness of breath with exertion from his baseline associated with a cough productive of yellow phlegm. Patient states that he was diagnosed with COVID-19 viral infection about 10 days ago but did not receive any antiviral therapy. He states that his symptoms initially improved but over the last 3 days he developed worsening shortness of breath from his baseline associated with a productive cough and chest congestion.  He denies having any fever or chills presently but had that when he was diagnosed with the COVID-19 viral infection.  11/13Feels sob improving. On 2.5 L 02  with satg 93% 11/14 still sob, but improving. On 2 L nasal cannula at 89% 11/15 while talking to me, he becomes sob. He also verified this. No cp  Consultants:    Procedures:   Antimicrobials:  Ceftriaxone, remdesivir, azithromycin   Subjective: No n/v  Objective: Vitals:   12/26/20 0052 12/26/20 0513 12/26/20 0819 12/26/20 1249  BP: (!) 141/80 135/90 140/87 100/61  Pulse: 88 91 87 89  Resp: 20 18 18 18   Temp: 97.7 F (36.5 C) 97.7 F (36.5 C) 97.8 F (36.6 C) 97.9 F (36.6 C)  TempSrc:      SpO2: 94% 94% 90% 94%  Weight:      Height:        Intake/Output Summary (Last 24 hours) at 12/26/2020 1310 Last data filed at 12/25/2020 2106 Gross per 24 hour  Intake --  Output 225 ml  Net -225 ml   Filed Weights   12/22/20 1159  Weight: 99.8 kg    Examination: Calm, sob with talking Mild exp. Wheezing, increase exp time Reg s1/s2 no gallop Soft benign +bs No edema aaoxox3   Data Reviewed: I have personally reviewed following labs and imaging  studies  CBC: Recent Labs  Lab 12/22/20 1200 12/23/20 0456  WBC 9.6 6.4  HGB 14.7 13.5  HCT 43.5 40.6  MCV 92.0 90.8  PLT 211 035   Basic Metabolic Panel: Recent Labs  Lab 12/22/20 1200 12/23/20 0456 12/25/20 0415  NA 131* 132*  --   K 4.3 4.6 4.0  CL 97* 100  --   CO2 27 26  --   GLUCOSE 133* 182*  --   BUN 16 18  --   CREATININE 0.74 0.70  --   CALCIUM 8.3* 8.2*  --    GFR: Estimated Creatinine Clearance: 90.1 mL/min (by C-G formula based on SCr of 0.7 mg/dL). Liver Function Tests: No results for input(s): AST, ALT, ALKPHOS, BILITOT, PROT, ALBUMIN in the last 168 hours. No results for input(s): LIPASE, AMYLASE in the last 168 hours. No results for input(s): AMMONIA in the last 168 hours. Coagulation Profile: No results for input(s): INR, PROTIME in the last 168 hours. Cardiac Enzymes: No results for input(s): CKTOTAL, CKMB, CKMBINDEX, TROPONINI in the last 168 hours. BNP (last 3 results) No results for input(s): PROBNP in the last 8760 hours. HbA1C: No results for input(s): HGBA1C in the last 72 hours. CBG: Recent Labs  Lab 12/25/20 1132 12/25/20 1654 12/25/20 2109 12/26/20 0820 12/26/20 1248  GLUCAP 110* 164* 122* 95 160*   Lipid Profile: No results for input(s): CHOL, HDL, LDLCALC, TRIG, CHOLHDL,  LDLDIRECT in the last 72 hours. Thyroid Function Tests: No results for input(s): TSH, T4TOTAL, FREET4, T3FREE, THYROIDAB in the last 72 hours. Anemia Panel: No results for input(s): VITAMINB12, FOLATE, FERRITIN, TIBC, IRON, RETICCTPCT in the last 72 hours. Sepsis Labs: No results for input(s): PROCALCITON, LATICACIDVEN in the last 168 hours.  Recent Results (from the past 240 hour(s))  Resp Panel by RT-PCR (Flu A&B, Covid) Nasopharyngeal Swab     Status: Abnormal   Collection Time: 12/22/20  1:46 PM   Specimen: Nasopharyngeal Swab; Nasopharyngeal(NP) swabs in vial transport medium  Result Value Ref Range Status   SARS Coronavirus 2 by RT PCR POSITIVE (A)  NEGATIVE Final    Comment: RESULT CALLED TO, READ BACK BY AND VERIFIED WITH: JACKIE FERGUSON @1444  12/22/20 MJU (NOTE) SARS-CoV-2 target nucleic acids are DETECTED.  The SARS-CoV-2 RNA is generally detectable in upper respiratory specimens during the acute phase of infection. Positive results are indicative of the presence of the identified virus, but do not rule out bacterial infection or co-infection with other pathogens not detected by the test. Clinical correlation with patient history and other diagnostic information is necessary to determine patient infection status. The expected result is Negative.  Fact Sheet for Patients: EntrepreneurPulse.com.au  Fact Sheet for Healthcare Providers: IncredibleEmployment.be  This test is not yet approved or cleared by the [place] FDA and  has been authorized for detection and/or diagnosis of SARS-CoV-2 by FDA under an Emergency Use Authorization (EUA).  This EUA will remain in effect (meaning this test can b e used) for the duration of  the COVID-19 declaration under Section 564(b)(1) of the Act, 21 U.S.C. section 360bbb-3(b)(1), unless the authorization is terminated or revoked sooner.     Influenza A by PCR NEGATIVE NEGATIVE Final   Influenza B by PCR NEGATIVE NEGATIVE Final    Comment: (NOTE) The Xpert Xpress SARS-CoV-2/FLU/RSV plus assay is intended as an aid in the diagnosis of influenza from Nasopharyngeal swab specimens and should not be used as a sole basis for treatment. Nasal washings and aspirates are unacceptable for Xpert Xpress SARS-CoV-2/FLU/RSV testing.  Fact Sheet for Patients: EntrepreneurPulse.com.au  Fact Sheet for Healthcare Providers: IncredibleEmployment.be  This test is not yet approved or cleared by the [place] FDA and has been authorized for detection and/or diagnosis of SARS-CoV-2 by FDA under an Emergency Use  Authorization (EUA). This EUA will remain in effect (meaning this test can be used) for the duration of the COVID-19 declaration under Section 564(b)(1) of the Act, 21 U.S.C. section 360bbb-3(b)(1), unless the authorization is terminated or revoked.  Performed at [facility], [address], [place], [place] [address]          Radiology Studies: DG Chest [place] 1 View  Result Date: 12/26/2020 CLINICAL DATA:  Shortness of breath EXAM: PORTABLE CHEST 1 VIEW COMPARISON:  Chest radiograph 12/22/2020 FINDINGS: The cardiomediastinal silhouette is normal. There is no focal consolidation or pulmonary edema. There is no pleural effusion or pneumothorax. There is no acute osseous abnormality. IMPRESSION: Stable chest with no radiographic evidence of acute cardiopulmonary process. Electronically Signed   By: Valetta Mole M.D.   On: 12/26/2020 11:27        Scheduled Meds:  albuterol  2 puff Inhalation Q6H   amLODipine  5 mg Oral Daily   aspirin EC  81 mg Oral QPM   enoxaparin (LOVENOX) injection  40 mg Subcutaneous Q24H   FLUoxetine  20 mg Oral BID   furosemide  20 mg Oral  Daily   guaiFENesin  600 mg Oral BID   insulin aspart  0-15 Units Subcutaneous TID WC   loratadine  10 mg Oral QPM   LORazepam  0.5 mg Oral QHS   losartan  25 mg Oral Daily   multivitamin with minerals  1 tablet Oral Daily   pantoprazole  40 mg Oral Daily   simvastatin  20 mg Oral QHS   sucralfate  1 g Oral TID WC & HS   tamsulosin  0.4 mg Oral QPC breakfast   zinc sulfate  220 mg Oral Daily   Continuous Infusions:  azithromycin 500 mg (12/26/20 0940)   cefTRIAXone (ROCEPHIN)  IV 1 g (12/25/20 1712)   remdesivir 100 mg in NS 100 mL 100 mg (12/26/20 1310)    Assessment & Plan:   Principal Problem:   COPD with acute exacerbation ([place]) Active Problems:   Benign prostatic hyperplasia with urinary obstruction   Coronary artery disease   Depression   Hypertension   COVID-19 virus infection    Nicotine dependence   Cardiomyopathy [facility])   Patient is an 80 year old male who was diagnosed with COVID-19 viral infection about 10 days ago.  He presents to the ER for evaluation of worsening shortness of breath, productive cough and wheezing consistent with COPD exacerbation.       Acute COPD exacerbation Patient with a history of COPD and nicotine dependence and COVID-positive infection 11/15 still symptomatic and wheezing on exam today.  He appears more short of breath today  Change p.o. to IV steroid Check chest x-ray Continue Mucinex, antibiotics, inhalers and flutter valves            COVID-19 viral infection Patient's initial positive test was over 10 days ago His CT level is 23.1 consistent with an active infection 11/15 continue IV remdesivir Continue steroids but in IV form as above         Cardiomyopathy Last known LVEF from a 2D echocardiogram done in 2018 was 45% holding metoprolol, amlodipine, losartan, furosemide due to relative hypotension  May need to use Lasix as needed since he is on steroids  11/13 echo was read as normal EF and diastolic function.  Echo personally reviewed patient with diastolic stage I abnormality 11/50 continue losartan, Lasix         Depression Continue fluoxetine     Nicotine dependence Smoking counseling was done  Patient declined nicotine patch    Essential hypertension Initially BP meds were held due to hypotension Will resume losartan Continue amlodipine   GERD Continue PPI       BPH Continue Flomax  DMII Hold po meds Monitor bg on steroids RISS   DVT prophylaxis: Lovenox Code Status: DNR Family Communication: None at bedside Disposition Plan: [place] Status is: Inpatient  Remains inpatient appropriate because: Due to requirement of IV treatment and severity of his illness.            LOS: 4 days   Time spent: 35 minutes with more than 50% on [place], MD [facility] Pager [phone]  If 7PM-7AM, please contact night-coverage 12/26/2020, 1:10 PM

## 2020-12-26 NOTE — Progress Notes (Signed)
Physical Therapy Treatment Patient Details Name: Charles Ewing MRN: [id number] DOB: 1940-04-16 Today's Date: 12/26/2020   History of Present Illness Pt is an 80 y.o. male presenting to hospital 12/22/20 with worsening SOB (diagnosed with COVID-19 viral infection about 10 days ago but did not receive antiviral therapy).  Pt admitted with acute COPD exacerbation, COVID-19 viral infection, and cardiomyopathy.  PMH includes AAA, anxiety, COPD, htn, skin CA, PTSD, B LE edema, CAD, orthostatic hypotension.    PT Comments    Pt received supine in bed, pleasant and agreeable to therapy. RN in room and asks if PT can perform O2 test with pt. Pt performed bed mobility with SUP and all out of bed mobility with CGA. Pt increased walking distance from 21ft to 31ft using RW; CGA for safety with steadying assist required on 1 occasion during turn. Pt states he could have walked further. O2 testing performed at this time with RN present to observe SpO2 trend - SpO2 remained 89-93% on RA. Pt reported dizziness however this cleared midway through walk. Pt would benefit from continued PT in order to progress strength training and overall functional endurance.     Recommendations for follow up therapy are one component of a multi-disciplinary discharge planning process, led by the attending physician.  Recommendations may be updated based on patient status, additional functional criteria and insurance authorization.  Follow Up Recommendations  Home health PT     Assistance Recommended at Discharge Intermittent Supervision/Assistance  Equipment Recommendations  Rolling walker (2 wheels);BSC/3in1    Recommendations for Other Services       Precautions / Restrictions Precautions Precautions: Fall Restrictions Weight Bearing Restrictions: No     Mobility  Bed Mobility Overal bed mobility: Needs Assistance Bed Mobility: Supine to Sit;Sit to Supine     Supine to sit: Supervision;HOB elevated Sit to  supine: Supervision   General bed mobility comments: Increased time and effort; PT managed lines    Transfers Overall transfer level: Needs assistance Equipment used: Rolling walker (2 wheels) Transfers: Sit to/from Stand Sit to Stand: Min guard           General transfer comment: CGA to steady as pt transitioned hands from bed to RW.    Ambulation/Gait Ambulation/Gait assistance: Min guard Gait Distance (Feet): 20 Feet Assistive device: Rolling walker (2 wheels) Gait Pattern/deviations: Step-through pattern;Trunk flexed;Decreased stride length Gait velocity: decreased     General Gait Details: 16ft using RW; CGA for safety with steadying assist required on 1 occasion during turn. Pt states he could have walked further. O2 testing performed at this time with RN present to observe SpO2 trend - SpO2 remained 89-93% on RA. Pt reported dizziness however this cleared midway through walk.   Stairs             Wheelchair Mobility    Modified Rankin (Stroke Patients Only)       Balance Overall balance assessment: Needs assistance Sitting-balance support: No upper extremity supported;Feet supported Sitting balance-Leahy Scale: Good     Standing balance support: Bilateral upper extremity supported Standing balance-Leahy Scale: Poor Standing balance comment: Fair static stability; one LOB during dynamic mobility (turning in place)                            Cognition Arousal/Alertness: Awake/alert Behavior During Therapy: WFL for tasks assessed/performed Overall Cognitive Status: Within Functional Limits for tasks assessed  Exercises      General Comments        Pertinent Vitals/Pain Pain Assessment: No/denies pain    Home Living                          Prior Function            PT Goals (current goals can now be found in the care plan section) Acute Rehab PT  Goals Patient Stated Goal: to improve breathing and mobility PT Goal Formulation: With patient Time For Goal Achievement: 01/07/21 Potential to Achieve Goals: Good    Frequency    Min 2X/week      PT Plan      Co-evaluation              AM-PAC PT "6 Clicks" Mobility   Outcome Measure  Help needed turning from your back to your side while in a flat bed without using bedrails?: None Help needed moving from lying on your back to sitting on the side of a flat bed without using bedrails?: A Little Help needed moving to and from a bed to a chair (including a wheelchair)?: A Little Help needed standing up from a chair using your arms (e.g., wheelchair or bedside chair)?: A Little Help needed to walk in hospital room?: A Little Help needed climbing 3-5 steps with a railing? : A Little 6 Click Score: 19    End of Session Equipment Utilized During Treatment: Gait belt Activity Tolerance: Patient limited by fatigue Patient left: in bed;with call bell/phone within reach;with bed alarm set Nurse Communication: Mobility status;Precautions PT Visit Diagnosis: Other abnormalities of gait and mobility (R26.89);Muscle weakness (generalized) (M62.81);History of falling (Z91.81)     Time: 0034-9179 PT Time Calculation (min) (ACUTE ONLY): 29 min  Charges:  $Therapeutic Activity: 23-37 mins                     Patrina Levering PT, DPT 12/26/20 2:35 PM [phone]

## 2020-12-27 DIAGNOSIS — U071 COVID-19: Principal | ICD-10-CM

## 2020-12-27 DIAGNOSIS — I10 Essential (primary) hypertension: Secondary | ICD-10-CM

## 2020-12-27 DIAGNOSIS — J9601 Acute respiratory failure with hypoxia: Secondary | ICD-10-CM

## 2020-12-27 LAB — GLUCOSE, CAPILLARY
Glucose-Capillary: 87 mg/dL (ref 70–99)
Glucose-Capillary: 108 mg/dL — ABNORMAL HIGH (ref 70–99)
Glucose-Capillary: 142 mg/dL — ABNORMAL HIGH (ref 70–99)

## 2020-12-27 LAB — D-DIMER, QUANTITATIVE: D-Dimer, Quant: 0.85 ug/mL-FEU — ABNORMAL HIGH (ref 0.00–0.50)

## 2020-12-27 LAB — C-REACTIVE PROTEIN: CRP: 2.1 mg/dL — ABNORMAL HIGH (ref <1.0)

## 2020-12-27 MED ORDER — FUROSEMIDE 20 MG PO TABS: 20.0000 mg | ORAL_TABLET | Freq: Every day | ORAL | Status: DC

## 2020-12-27 NOTE — Progress Notes (Incomplete)
Physical Therapy Treatment Patient Details Name: Charles Ewing MRN: [id number] DOB: October 09, 1940 Today's Date: 12/27/2020   History of Present Illness Pt is an 80 y.o. male presenting to hospital 12/22/20 with worsening SOB (diagnosed with COVID-19 viral infection about 10 days ago but did not receive antiviral therapy).  Pt admitted with acute COPD exacerbation, COVID-19 viral infection, and cardiomyopathy.  PMH includes AAA, anxiety, COPD, htn, skin CA, PTSD, B LE edema, CAD, orthostatic hypotension.    PT Comments    ***   Recommendations for follow up therapy are one component of a multi-disciplinary discharge planning process, led by the attending physician.  Recommendations may be updated based on patient status, additional functional criteria and insurance authorization.  Follow Up Recommendations  Home health PT     Assistance Recommended at Discharge Intermittent Supervision/Assistance  Equipment Recommendations   (reports he has a RW at home)    Recommendations for Other Services       Precautions / Restrictions Precautions Precautions: Fall Restrictions Weight Bearing Restrictions: No     Mobility  Bed Mobility Overal bed mobility: Needs Assistance Bed Mobility: Supine to Sit     Supine to sit: Modified independent (Device/Increase time) Sit to supine: Modified independent (Device/Increase time)        Transfers Overall transfer level: Needs assistance Equipment used: Rolling walker (2 wheels) Transfers: Sit to/from Stand Sit to Stand: Supervision           General transfer comment: completes movement transition without UE support, mild use of momentum for initial lift off    Ambulation/Gait Ambulation/Gait assistance: Min guard;Supervision Gait Distance (Feet): 100 Feet Assistive device: Rolling walker (2 wheels)         General Gait Details: reciprocal stepping pattern with narrowed BOS, forward flexed posture and mild/mod WBing on RW.   Mild SOB with exertion, sats >90% on RA throughout.   Stairs             Wheelchair Mobility    Modified Rankin (Stroke Patients Only)       Balance Overall balance assessment: Needs assistance Sitting-balance support: No upper extremity supported;Feet supported Sitting balance-Leahy Scale: Good     Standing balance support: Bilateral upper extremity supported Standing balance-Leahy Scale: Fair                              Cognition Arousal/Alertness: Awake/alert Behavior During Therapy: WFL for tasks assessed/performed Overall Cognitive Status: Within Functional Limits for tasks assessed                                 General Comments: Slightly irritable upon arrival to room-anxious for discharge home; resolved with active listening, encouragement        Exercises Other Exercises Other Exercises: Toilet transfer, ambulatory with RW, cga/close sup; standing balance for urination at toilet, close sup Other Exercises: Educated in safety, need for RW with all transfers/gait; patient voiced understanding and agreement.    General Comments        Pertinent Vitals/Pain Pain Assessment: No/denies pain    Home Living                          Prior Function            PT Goals (current goals can now be found in the care plan section) Acute Rehab  PT Goals Patient Stated Goal: to improve breathing and mobility PT Goal Formulation: With patient Time For Goal Achievement: 01/07/21 Potential to Achieve Goals: Good Progress towards PT goals: Progressing toward goals    Frequency    Min 2X/week      PT Plan Current plan remains appropriate    Co-evaluation              AM-PAC PT "6 Clicks" Mobility   Outcome Measure  Help needed turning from your back to your side while in a flat bed without using bedrails?: None Help needed moving from lying on your back to sitting on the side of a flat bed without using  bedrails?: None Help needed moving to and from a bed to a chair (including a wheelchair)?: None Help needed standing up from a chair using your arms (e.g., wheelchair or bedside chair)?: None Help needed to walk in hospital room?: A Little Help needed climbing 3-5 steps with a railing? : A Little 6 Click Score: 22    End of Session Equipment Utilized During Treatment: Gait belt Activity Tolerance: Patient tolerated treatment well Patient left: in bed;with call bell/phone within reach Nurse Communication: Mobility status PT Visit Diagnosis: Other abnormalities of gait and mobility (R26.89);Muscle weakness (generalized) (M62.81);History of falling (Z91.81)     Time: 3734-2876 PT Time Calculation (min) (ACUTE ONLY): 33 min  Charges:  $Gait Training: 8-22 mins $Therapeutic Activity: 8-22 mins                     {enter signature dotphrase here}   Ellison Hughs 12/27/2020, 1:49 PM
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
## 2020-12-27 NOTE — Discharge Summary (Signed)
Discharge Summary  Charles Ewing IOE:[id number] DOB: 12/04/1940  PCP: Olin Hauser, DO  Admit date: 12/22/2020 Discharge date: 12/27/2020  Time spent: 40 minutes  Recommendations for Outpatient Follow-up:  Patient going home with home health PT, OT, RN  Discharge Diagnoses:  Active Hospital Problems   Diagnosis Date Noted   COPD with acute exacerbation ([place]) 12/22/2020   Depression    Hypertension    COVID-19 virus infection    Nicotine dependence    Cardiomyopathy ([place])    Coronary artery disease    Benign prostatic hyperplasia with urinary obstruction 04/14/2012    Resolved Hospital Problems  No resolved problems to display.    Discharge Condition: Improved, being discharged home  Diet recommendation: Low-sodium, heart healthy  Vitals:   12/27/20 0803 12/27/20 1111  BP: 134/81 128/76  Pulse: 83 87  Resp: 18 18  Temp: 97.6 F (36.4 C) 97.8 F (36.6 C)  SpO2: 94% 97%    History of present illness:  80 year old male past medical history significant for tobacco abuse, hypertension, depression and CAD presented to the emergency room on 11/11 for shortness of breath.  He stated that he was diagnosed with COVID 10 days prior but did not receive any antiviral therapy.  In ED hypoxic on admission admitted to hospitalist service.  Started on antibiotics for pneumonia as well and was Remdisivir for COVID.  Hospital Course:  Principal Problem: Acute respiratory failure with hypoxia secondary to pneumonia, COVID and COPD with acute exacerbation ([place]): Treated with nebulizers, supplemental oxygen, Remdisivir, steroids and antibiotics.  Completed course of Remdisivir and antibiotics.  Able to be weaned off of oxygen and by 11/16, stable on room air.  Seen by physical therapy who recommended home health. Active Problems:   Benign prostatic hyperplasia with urinary obstruction: Stable, continued on Flomax    Coronary artery disease: Stable.    Depression:  Stable, continue home medications    Hypertension: Stable, continue home medications  Procedures: None  Consultations: None  Discharge Exam: BP 128/76 (BP Location: Left Arm)   Pulse 87   Temp 97.8 F (36.6 C)   Resp 18   Ht 6' (1.829 m)   Wt 99.8 kg   SpO2 97%   BMI 29.84 kg/m   General: Alert and oriented x3, no acute distress Cardiovascular: Regular rate and rhythm, S1-S2 Respiratory: Decreased breath sounds throughout  Discharge Instructions You were cared for by a hospitalist during your hospital stay. If you have any questions about your discharge medications or the care you received while you were in the hospital after you are discharged, you can call the unit and asked to speak with the hospitalist on call if the hospitalist that took care of you is not available. Once you are discharged, your primary care physician will handle any further medical issues. Please note that NO REFILLS for any discharge medications will be authorized once you are discharged, as it is imperative that you return to your primary care physician (or establish a relationship with a primary care physician if you do not have one) for your aftercare needs so that they can reassess your need for medications and monitor your lab values.  Discharge Instructions     Diet - low sodium heart healthy   Complete by: As directed    Increase activity slowly   Complete by: As directed       Allergies as of 12/27/2020       Reactions   Nexium [esomeprazole Magnesium]  Medication List     STOP taking these medications    esomeprazole 40 MG capsule Commonly known as: NEXIUM   loratadine 10 MG tablet Commonly known as: CLARITIN   metFORMIN 500 MG 24 hr tablet Commonly known as: GLUCOPHAGE-XR       TAKE these medications    albuterol 108 (90 Base) MCG/ACT inhaler Commonly known as: VENTOLIN HFA INHALE 2 PUFFS INTO THE LUNGS EVERY 6 HOURS AS NEEDED FOR WHEEZING OR SHORTNESS OF  BREATH.   amLODipine 5 MG tablet Commonly known as: NORVASC Take 5 mg by mouth daily.   aspirin EC 81 MG tablet Take 81 mg by mouth every evening.   FLUoxetine 20 MG capsule Commonly known as: PROZAC Take 1 capsule (20 mg total) by mouth 2 (two) times daily.   fluticasone 50 MCG/ACT nasal spray Commonly known as: FLONASE PLACE 2 SPRAYS INTO BOTH NOSTRILS DAILY   furosemide 20 MG tablet Commonly known as: LASIX Take 1 tablet (20 mg total) by mouth daily. Start taking on: December 28, 2020 What changed: See the new instructions.   glucose blood test strip Check blood glucose three times weekly.   hydrochlorothiazide 25 MG tablet Commonly known as: HYDRODIURIL   HYDROcodone bit-homatropine 5-1.5 MG/5ML syrup Commonly known as: HYCODAN Take 5 mLs by mouth every 8 (eight) hours as needed for cough.   LORazepam 0.5 MG tablet Commonly known as: ATIVAN TAKE 1 TABLET BY MOUTH AT BEDTIME AS NEEDED FOR ANXIETY   losartan 25 MG tablet Commonly known as: COZAAR TAKE 1 TABLET BY MOUTH DAILY   Medical Compression Stockings Misc 1 each by Does not apply route daily.   meloxicam 15 MG tablet Commonly known as: MOBIC meloxicam 15 mg tablet  TAKE 1 TABLET BY MOUTH DAILY WITH MEALS   metoprolol succinate 100 MG 24 hr tablet Commonly known as: TOPROL-XL Take 100 mg by mouth daily. Take with or immediately following a meal.   nystatin 100000 UNIT/ML suspension Commonly known as: MYCOSTATIN TAKE 5 mls (1 TEASPOONFUL) BY MOUTH 4 TIMES DAILY.   ONE TOUCH ULTRA SYSTEM KIT w/Device Kit 1 kit by Does not apply route once. CHeck blood glucose in the AM three times weekly.   onetouch ultrasoft lancets Use as instructed   oxyCODONE 5 MG immediate release tablet Commonly known as: Oxy IR/ROXICODONE Take 1 tablet (5 mg total) by mouth every 4 (four) hours as needed for severe pain.   pantoprazole 20 MG tablet Commonly known as: PROTONIX Take 20 mg by mouth daily.   potassium  chloride 10 MEQ CR capsule Commonly known as: MICRO-K   simvastatin 20 MG tablet Commonly known as: ZOCOR Take 20 mg by mouth at bedtime.   sucralfate 1 g tablet Commonly known as: Carafate Take 1 tablet (1 g total) by mouth 4 (four) times daily -  with meals and at bedtime. As needed   tamsulosin 0.4 MG Caps capsule Commonly known as: FLOMAX Take 1 capsule (0.4 mg total) by mouth daily after breakfast.               Durable Medical Equipment  (From admission, onward)           Start     Ordered   12/26/20 1318  For home use only DME Walker rolling  Once       Question Answer Comment  Walker: Other   Comments 2 wheels. 3 in one   Patient needs a walker to treat with the following condition Weakness  12/26/20 1318           Allergies  Allergen Reactions   Nexium [Esomeprazole Magnesium]       The results of significant diagnostics from this hospitalization (including imaging, microbiology, ancillary and laboratory) are listed below for reference.    Significant Diagnostic Studies: DG Chest 2 View  Result Date: 12/22/2020 CLINICAL DATA:  Shortness of breath EXAM: CHEST - 2 VIEW COMPARISON:  Radiograph 02/28/2015 FINDINGS: Unchanged cardiomediastinal silhouette. There is no focal airspace consolidation. There is no pleural effusion or visible pneumothorax. There is no acute osseous abnormality. Thoracic spondylosis. IMPRESSION: No evidence of acute cardiopulmonary disease. Electronically Signed   By: Maurine Simmering M.D.   On: 12/22/2020 12:27   DG Chest Port 1 View  Result Date: 12/26/2020 CLINICAL DATA:  Shortness of breath EXAM: PORTABLE CHEST 1 VIEW COMPARISON:  Chest radiograph 12/22/2020 FINDINGS: The cardiomediastinal silhouette is normal. There is no focal consolidation or pulmonary edema. There is no pleural effusion or pneumothorax. There is no acute osseous abnormality. IMPRESSION: Stable chest with no radiographic evidence of acute  cardiopulmonary process. Electronically Signed   By: Valetta Mole M.D.   On: 12/26/2020 11:27   ECHOCARDIOGRAM COMPLETE  Result Date: 12/23/2020    ECHOCARDIOGRAM REPORT   Patient Name:   TREVEON BOURCIER Date of Exam: 12/23/2020 Medical Rec #:  [id number]      Height:       72.0 in Accession #:    [id number]     Weight:       220.0 lb Date of Birth:  December 07, 1940     BSA:          2.219 m Patient Age:    28 years       BP:           119/65 mmHg Patient Gender: M              HR:           92 bpm. 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                  CAD; Risk Factors:Hypertension.  Sonographer:     Alyse Low [place] Referring Phys:  [place] Diagnosing Phys: Serafina Royals MD IMPRESSIONS  1. Left ventricular ejection fraction, by estimation, is 60 to 65%. The left ventricle has normal function. The left ventricle has no regional wall motion abnormalities. Left ventricular diastolic parameters were normal.  2. Right ventricular systolic function is normal. The right ventricular size is normal.  3. The mitral valve is normal in structure. Trivial mitral valve regurgitation.  4. The aortic valve is normal in structure. Aortic valve regurgitation is not visualized. FINDINGS  Left Ventricle: Left ventricular ejection fraction, by estimation, is 60 to 65%. The left ventricle has normal function. The left ventricle has no regional wall motion abnormalities. The left ventricular internal cavity size was small. There is no left ventricular hypertrophy. Left ventricular diastolic parameters were normal. Right Ventricle: The right ventricular size is normal. No increase in right ventricular wall thickness. Right ventricular systolic function is normal. Left Atrium: Left atrial size was normal in size. Right Atrium: Right atrial size was normal in size.  Pericardium: There is no evidence of pericardial effusion. Mitral Valve: The mitral valve is normal in  structure. Trivial mitral valve regurgitation. Tricuspid Valve: The tricuspid valve is normal in structure. Tricuspid valve regurgitation is trivial. Aortic Valve: The aortic valve is normal in structure. Aortic valve regurgitation is not visualized. Aortic valve peak gradient measures 4.0 mmHg. Pulmonic Valve: The pulmonic valve was normal in structure. Pulmonic valve regurgitation is not visualized. Aorta: The aortic root and ascending aorta are structurally normal, with no evidence of dilitation. IAS/Shunts: No atrial level shunt detected by color flow Doppler.  LEFT VENTRICLE PLAX 2D LVIDd:         4.60 cm LVIDs:         3.70 cm LV PW:         1.10 cm LV IVS:        1.10 cm LVOT diam:     1.80 cm LVOT Area:     2.54 cm  LEFT ATRIUM         Index LA diam:    3.00 cm 1.35 cm/m  AORTIC VALVE                 PULMONIC VALVE AV Area (Vmax): 2.02 cm     PV Vmax:        0.75 m/s AV Vmax:        100.00 cm/s  PV Peak grad:   2.2 mmHg AV Peak Grad:   4.0 mmHg     RVOT Peak grad: 3 mmHg LVOT Vmax:      79.30 cm/s  AORTA Ao Root diam: 3.00 cm MITRAL VALVE               TRICUSPID VALVE MV Area (PHT): 3.02 cm    TR Peak grad:   25.2 mmHg MV Decel Time: 251 msec    TR Vmax:        251.00 cm/s MV E velocity: 60.40 cm/s MV A velocity: 88.70 cm/s  SHUNTS MV E/A ratio:  0.68        Systemic Diam: 1.80 cm Serafina Royals MD Electronically signed by Serafina Royals MD Signature Date/Time: 12/23/2020/1:14:05 PM    Final     Microbiology: Recent Results (from the past 240 hour(s))  Resp Panel by RT-PCR (Flu A&B, Covid) Nasopharyngeal Swab     Status: Abnormal   Collection Time: 12/22/20  1:46 PM   Specimen: Nasopharyngeal Swab; Nasopharyngeal(NP) swabs in vial transport medium  Result Value Ref Range Status   SARS Coronavirus 2 by RT PCR POSITIVE (A) NEGATIVE Final    Comment: RESULT CALLED TO, READ BACK BY AND VERIFIED  WITH: JACKIE FERGUSON '@[id number]'  12/22/20 MJU (NOTE) SARS-CoV-2 target nucleic acids are DETECTED.  The SARS-CoV-2 RNA is generally detectable in upper respiratory specimens during the acute phase of infection. Positive results are indicative of the presence of the identified virus, but do not rule out bacterial infection or co-infection with other pathogens not detected by the test. Clinical correlation with patient history and other diagnostic information is necessary to determine patient infection status. The expected result is Negative.  Fact Sheet for Patients: EntrepreneurPulse.com.au  Fact Sheet for Healthcare Providers: IncredibleEmployment.be  This test is not yet approved or cleared by the [place] FDA and  has been authorized for detection and/or diagnosis of SARS-CoV-2 by FDA under an Emergency Use Authorization (EUA).  This EUA will remain in effect (meaning this test can b e used) for the duration of  the COVID-19 declaration under Section 564(b)(1) of the Act, 21 U.S.C. section 360bbb-3(b)(1), unless the authorization is terminated or revoked sooner.  Influenza A by PCR NEGATIVE NEGATIVE Final   Influenza B by PCR NEGATIVE NEGATIVE Final    Comment: (NOTE) The Xpert Xpress SARS-CoV-2/FLU/RSV plus assay is intended as an aid in the diagnosis of influenza from Nasopharyngeal swab specimens and should not be used as a sole basis for treatment. Nasal washings and aspirates are unacceptable for Xpert Xpress SARS-CoV-2/FLU/RSV testing.  Fact Sheet for Patients: EntrepreneurPulse.com.au  Fact Sheet for Healthcare Providers: IncredibleEmployment.be  This test is not yet approved or cleared by the [place] FDA and has been authorized for detection and/or diagnosis of SARS-CoV-2 by FDA under an Emergency Use Authorization (EUA). This EUA will remain in effect (meaning this test can be  used) for the duration of the COVID-19 declaration under Section 564(b)(1) of the Act, 21 U.S.C. section 360bbb-3(b)(1), unless the authorization is terminated or revoked.  Performed at [facility], [address] [place], [place] [address]      Labs: Basic Metabolic Panel: Recent Labs  Lab 12/22/20 1200 12/23/20 0456 12/25/20 0415  NA 131* 132*  --   K 4.3 4.6 4.0  CL 97* 100  --   CO2 27 26  --   GLUCOSE 133* 182*  --   BUN 16 18  --   CREATININE 0.74 0.70  --   CALCIUM 8.3* 8.2*  --    Liver Function Tests: No results for input(s): AST, ALT, ALKPHOS, BILITOT, PROT, ALBUMIN in the last 168 hours. No results for input(s): LIPASE, AMYLASE in the last 168 hours. No results for input(s): AMMONIA in the last 168 hours. CBC: Recent Labs  Lab 12/22/20 1200 12/23/20 0456  WBC 9.6 6.4  HGB 14.7 13.5  HCT 43.5 40.6  MCV 92.0 90.8  PLT 211 190   Cardiac Enzymes: No results for input(s): CKTOTAL, CKMB, CKMBINDEX, TROPONINI in the last 168 hours. BNP: BNP (last 3 results) Recent Labs    12/26/20 1328  BNP 43.5    ProBNP (last 3 results) No results for input(s): PROBNP in the last 8760 hours.  CBG: Recent Labs  Lab 12/26/20 1248 12/26/20 1618 12/26/20 2049 12/27/20 0803 12/27/20 1112  GLUCAP 160* 134* 129* 87 108*       Signed:  Annita Brod, MD [facility] 12/27/2020, 3:03 PM

## 2020-12-27 NOTE — TOC Transition Note (Signed)
Transition of Care [facility]) - CM/SW Discharge Note   Patient Details  Name: NAIN RUDD MRN: [id number] Date of Birth: 1940/12/21  [facility] CM/SW Contact:  Shelbie Hutching, RN Phone Number: 12/27/2020, 3:07 PM   Clinical Narrative:    Patient would like to discharge home today and he is medically cleared for discharge home with home health.  Patient does not qualify for home oxygen.  [facility] has accepted referral for home health services and will be calling to schedule initial visit at home.  Patient's son, Legrand Como, says that he will come pick the patient up and can be here around 6 pm.     Final next level of care: [place] Barriers to Discharge: Barriers Resolved   Patient Goals and CMS Choice Patient states their goals for this hospitalization and ongoing recovery are:: Patient would be glad to go home tomorrow with home health. CMS Medicare.gov Compare Post Acute Care list provided to:: Patient Choice offered to / list presented to : Patient  Discharge Placement                       Discharge Plan and Services   Discharge Planning Services: CM Consult Post Acute Care Choice: Home Health          DME Arranged: N/A DME Agency: NA       HH Arranged: RN, PT, OT [place] Agency: [place] ([place]) Date HH Agency Contacted: 12/27/20 Time [place]: 1507 Representative spoke with at [place]: [place] ([place]) Interventions     Readmission Risk Interventions No flowsheet data found.

## 2020-12-27 NOTE — Progress Notes (Signed)
Son expressed concern for living environment and patient returning COVID+. Educated son regarding COVID and encouraged to report information to APS. CM and RN aware. Orma Flaming, RN

## 2020-12-29 ENCOUNTER — Telehealth: Payer: Self-pay

## 2021-01-16 NOTE — Telephone Encounter (Signed)
NA

## 2021-02-09 ENCOUNTER — Ambulatory Visit: Payer: Medicare Other

## 2021-02-14 ENCOUNTER — Other Ambulatory Visit: Payer: Self-pay | Admitting: Family Medicine

## 2021-02-15 ENCOUNTER — Telehealth: Payer: Self-pay | Admitting: Family Medicine

## 2021-02-15 ENCOUNTER — Other Ambulatory Visit: Payer: Self-pay

## 2021-02-15 ENCOUNTER — Ambulatory Visit (INDEPENDENT_AMBULATORY_CARE_PROVIDER_SITE_OTHER): Payer: Medicare Other | Admitting: Family Medicine

## 2021-02-15 ENCOUNTER — Encounter: Payer: Self-pay | Admitting: Family Medicine

## 2021-02-15 VITALS — BP 110/63 | HR 78 | Ht 72.0 in | Wt 195.4 lb

## 2021-02-15 DIAGNOSIS — L739 Follicular disorder, unspecified: Secondary | ICD-10-CM

## 2021-02-15 DIAGNOSIS — M5441 Lumbago with sciatica, right side: Secondary | ICD-10-CM | POA: Diagnosis not present

## 2021-02-15 DIAGNOSIS — Z23 Encounter for immunization: Secondary | ICD-10-CM

## 2021-02-15 DIAGNOSIS — M51369 Other intervertebral disc degeneration, lumbar region without mention of lumbar back pain or lower extremity pain: Secondary | ICD-10-CM

## 2021-02-15 DIAGNOSIS — M5136 Other intervertebral disc degeneration, lumbar region: Secondary | ICD-10-CM | POA: Diagnosis not present

## 2021-02-15 DIAGNOSIS — M5442 Lumbago with sciatica, left side: Secondary | ICD-10-CM

## 2021-02-15 DIAGNOSIS — G8929 Other chronic pain: Secondary | ICD-10-CM

## 2021-02-15 MED ORDER — GABAPENTIN 100 MG PO CAPS: ORAL_CAPSULE | ORAL | 1 refills | Status: DC

## 2021-02-15 MED ORDER — OXYCODONE HCL 5 MG PO TABS: 5.0000 mg | ORAL_TABLET | Freq: Three times a day (TID) | ORAL | 0 refills | Status: DC | PRN

## 2021-02-15 MED ORDER — MUPIROCIN CALCIUM 2 % EX CREA: 1.0000 "application " | TOPICAL_CREAM | Freq: Two times a day (BID) | CUTANEOUS | 2 refills | Status: DC

## 2021-02-15 MED ORDER — MUPIROCIN 2 % EX OINT: 1.0000 "application " | TOPICAL_OINTMENT | Freq: Two times a day (BID) | CUTANEOUS | 0 refills | Status: DC

## 2021-02-15 NOTE — Progress Notes (Signed)
Subjective:    Patient ID: Charles Ewing, male    DOB: [birth year], 81 y.o.   MRN: [id number]  Charles Ewing is a 81 y.o. male presenting on 02/15/2021 for back pain   HPI  Chronic Low Back Pain, with bilateral sciatica Describes chronic low back pain, with radiating pain into both legs with sciatica, seems to be fairly persistent. He has followed with [facility], and has seen Dr Lamount Cranker, he has had an ESI injection 1.5 month without relief. - He admits difficulty with ambulation and has less functional, limited for ADL function. He has son that helps him now. Not on gabapentin Improved on oxy ir 5mg  taking 1-2 times per day but has completed last course, out of med. Interested in pain specialist next. Does not want surgery.  L Elbow Sore, Folliculitis Persistent redness irritation of skin with skin sore has scab. Not using any topical.  Health Maintenance: Due for Flu Shot, will receive today    Depression screen [facility] 2/9 02/15/2021 11/06/2020 05/25/2019  Decreased Interest 1 0 0  Down, Depressed, Hopeless 1 0 0  PHQ - 2 Score 2 0 0  Altered sleeping 1 - -  Tired, decreased energy 1 - -  Change in appetite 0 - -  Feeling bad or failure about yourself  0 - -  Trouble concentrating 0 - -  Moving slowly or fidgety/restless 0 - -  Suicidal thoughts 0 - -  PHQ-9 Score 4 - -  Difficult doing work/chores Not difficult at all - -    Social History   Tobacco Use   Smoking status: Every Day    Packs/day: 1.00    Years: 70.00    Pack years: 70.00    Types: Cigarettes   Smokeless tobacco: Current  Substance Use Topics   Alcohol use: No   Drug use: No    Review of Systems Per HPI unless specifically indicated above     Objective:    BP 110/63    Pulse 78    Ht 6' (1.829 m)    Wt 195 lb 6.4 oz (88.6 kg)    SpO2 98%    BMI 26.50 kg/m   Wt Readings from Last 3 Encounters:  02/15/21 195 lb 6.4 oz (88.6 kg)  12/22/20 220 lb (99.8 kg)  11/06/20 195 lb 9.6 oz  (88.7 kg)    Physical Exam Vitals and nursing note reviewed.  Constitutional:      General: He is not in acute distress.    Appearance: Normal appearance. He is well-developed. He is not diaphoretic.     Comments: Well-appearing, comfortable, cooperative  HENT:     Head: Normocephalic and atraumatic.  Eyes:     General:        Right eye: No discharge.        Left eye: No discharge.     Conjunctiva/sclera: Conjunctivae normal.  Cardiovascular:     Rate and Rhythm: Normal rate.  Pulmonary:     Effort: Pulmonary effort is normal.  Skin:    General: Skin is warm and dry.     Findings: Lesion (left elbow has small area of scab with slight erythema no abscess appreciated) present. No erythema or rash.  Neurological:     Mental Status: He is alert and oriented to person, place, and time.  Psychiatric:        Mood and Affect: Mood normal.        Behavior: Behavior normal.  Thought Content: Thought content normal.     Comments: Well groomed, good eye contact, normal speech and thoughts   Results for orders placed or performed during the hospital encounter of 12/22/20  Resp Panel by RT-PCR (Flu A&B, Covid) Nasopharyngeal Swab   Specimen: Nasopharyngeal Swab; Nasopharyngeal(NP) swabs in vial transport medium  Result Value Ref Range   SARS Coronavirus 2 by RT PCR POSITIVE (A) NEGATIVE   Influenza A by PCR NEGATIVE NEGATIVE   Influenza B by PCR NEGATIVE NEGATIVE  CBC  Result Value Ref Range   WBC 9.6 4.0 - 10.5 K/uL   RBC 4.73 4.22 - 5.81 MIL/uL   Hemoglobin 14.7 13.0 - 17.0 g/dL   HCT 43.5 39.0 - 52.0 %   MCV 92.0 80.0 - 100.0 fL   MCH 31.1 26.0 - 34.0 pg   MCHC 33.8 30.0 - 36.0 g/dL   RDW 13.5 11.5 - 15.5 %   Platelets 211 150 - 400 K/uL   nRBC 0.0 0.0 - 0.2 %  Basic metabolic panel  Result Value Ref Range   Sodium 131 (L) 135 - 145 mmol/L   Potassium 4.3 3.5 - 5.1 mmol/L   Chloride 97 (L) 98 - 111 mmol/L   CO2 27 22 - 32 mmol/L   Glucose, Bld 133 (H) 70 - 99 mg/dL    BUN 16 8 - 23 mg/dL   Creatinine, Ser 0.74 0.61 - 1.24 mg/dL   Calcium 8.3 (L) 8.9 - 10.3 mg/dL   GFR, Estimated >60 >60 mL/min   Anion gap 7 5 - 15  CBC  Result Value Ref Range   WBC 6.4 4.0 - 10.5 K/uL   RBC 4.47 4.22 - 5.81 MIL/uL   Hemoglobin 13.5 13.0 - 17.0 g/dL   HCT 40.6 39.0 - 52.0 %   MCV 90.8 80.0 - 100.0 fL   MCH 30.2 26.0 - 34.0 pg   MCHC 33.3 30.0 - 36.0 g/dL   RDW 13.2 11.5 - 15.5 %   Platelets 190 150 - 400 K/uL   nRBC 0.0 0.0 - 0.2 %  Basic metabolic panel  Result Value Ref Range   Sodium 132 (L) 135 - 145 mmol/L   Potassium 4.6 3.5 - 5.1 mmol/L   Chloride 100 98 - 111 mmol/L   CO2 26 22 - 32 mmol/L   Glucose, Bld 182 (H) 70 - 99 mg/dL   BUN 18 8 - 23 mg/dL   Creatinine, Ser 0.70 0.61 - 1.24 mg/dL   Calcium 8.2 (L) 8.9 - 10.3 mg/dL   GFR, Estimated >60 >60 mL/min   Anion gap 6 5 - 15  D-dimer, quantitative  Result Value Ref Range   D-Dimer, Quant 0.73 (H) 0.00 - 0.50 ug/mL-FEU  C-reactive protein  Result Value Ref Range   CRP 22.0 (H) <1.0 mg/dL  Glucose, capillary  Result Value Ref Range   Glucose-Capillary 164 (H) 70 - 99 mg/dL  Glucose, capillary  Result Value Ref Range   Glucose-Capillary 230 (H) 70 - 99 mg/dL  Glucose, capillary  Result Value Ref Range   Glucose-Capillary 137 (H) 70 - 99 mg/dL  Glucose, capillary  Result Value Ref Range   Glucose-Capillary 129 (H) 70 - 99 mg/dL  Glucose, capillary  Result Value Ref Range   Glucose-Capillary 149 (H) 70 - 99 mg/dL  D-dimer, quantitative  Result Value Ref Range   D-Dimer, Quant 0.88 (H) 0.00 - 0.50 ug/mL-FEU  C-reactive protein  Result Value Ref Range   CRP 11.2 (H) <1.0 mg/dL  Glucose, capillary  Result Value Ref Range   Glucose-Capillary 166 (H) 70 - 99 mg/dL  Glucose, capillary  Result Value Ref Range   Glucose-Capillary 88 70 - 99 mg/dL  Glucose, capillary  Result Value Ref Range   Glucose-Capillary 103 (H) 70 - 99 mg/dL  D-dimer, quantitative  Result Value Ref Range    D-Dimer, Quant 0.88 (H) 0.00 - 0.50 ug/mL-FEU  C-reactive protein  Result Value Ref Range   CRP 5.2 (H) <1.0 mg/dL  Potassium  Result Value Ref Range   Potassium 4.0 3.5 - 5.1 mmol/L  Glucose, capillary  Result Value Ref Range   Glucose-Capillary 132 (H) 70 - 99 mg/dL  Glucose, capillary  Result Value Ref Range   Glucose-Capillary 133 (H) 70 - 99 mg/dL  Glucose, capillary  Result Value Ref Range   Glucose-Capillary 100 (H) 70 - 99 mg/dL  Glucose, capillary  Result Value Ref Range   Glucose-Capillary 110 (H) 70 - 99 mg/dL  Glucose, capillary  Result Value Ref Range   Glucose-Capillary 164 (H) 70 - 99 mg/dL  D-dimer, quantitative  Result Value Ref Range   D-Dimer, Quant 0.91 (H) 0.00 - 0.50 ug/mL-FEU  C-reactive protein  Result Value Ref Range   CRP 2.7 (H) <1.0 mg/dL  Glucose, capillary  Result Value Ref Range   Glucose-Capillary 122 (H) 70 - 99 mg/dL  Glucose, capillary  Result Value Ref Range   Glucose-Capillary 95 70 - 99 mg/dL  Glucose, capillary  Result Value Ref Range   Glucose-Capillary 160 (H) 70 - 99 mg/dL  Brain natriuretic peptide  Result Value Ref Range   B Natriuretic Peptide 43.5 0.0 - 100.0 pg/mL  Glucose, capillary  Result Value Ref Range   Glucose-Capillary 134 (H) 70 - 99 mg/dL  D-dimer, quantitative  Result Value Ref Range   D-Dimer, Quant 0.85 (H) 0.00 - 0.50 ug/mL-FEU  C-reactive protein  Result Value Ref Range   CRP 2.1 (H) <1.0 mg/dL  Glucose, capillary  Result Value Ref Range   Glucose-Capillary 129 (H) 70 - 99 mg/dL  Glucose, capillary  Result Value Ref Range   Glucose-Capillary 87 70 - 99 mg/dL  Glucose, capillary  Result Value Ref Range   Glucose-Capillary 108 (H) 70 - 99 mg/dL  Glucose, capillary  Result Value Ref Range   Glucose-Capillary 142 (H) 70 - 99 mg/dL  ECHOCARDIOGRAM COMPLETE  Result Value Ref Range   Weight 3,520 oz   Height 72 in   BP 155/86 mmHg   Ao pk vel 1.00 m/s   AR max vel 2.02 cm2   AV Peak grad 4.0  mmHg   S' Lateral 3.70 cm   Area-P 1/2 3.02 cm2  Troponin I (High Sensitivity)  Result Value Ref Range   Troponin I (High Sensitivity) 5 <18 ng/L      Assessment & Plan:   Problem List Items Addressed This Visit   None Visit Diagnoses     Chronic right-sided low back pain with bilateral sciatica    -  Primary   Relevant Medications   oxyCODONE (OXY IR/ROXICODONE) 5 MG immediate release tablet   gabapentin (NEURONTIN) 100 MG capsule   Other Relevant Orders   Ambulatory referral to Pain Clinic   DDD (degenerative disc disease), lumbar       Relevant Medications   oxyCODONE (OXY IR/ROXICODONE) 5 MG immediate release tablet   gabapentin (NEURONTIN) 100 MG capsule   Other Relevant Orders   Ambulatory referral to Pain Clinic   Folliculitis  Relevant Medications   mupirocin cream (BACTROBAN) 2 %   Needs flu shot       Relevant Orders   Flu Vaccine QUAD High Dose(Fluad)      Chronic Low Back Pain Bilateral Sciatica Lumbar DDD Previously followed by [facility], has had ESI, limited success, they are offering spine surgical procedure but patient declines, not interested in surgery.  We have managed his pain temporarily on low dose opioid while he was pursuing Orthopedic intervention to improve his function w/ ADLs. He has assistance with his son now.  I will agree to continue bridge opioid therapy until he can get longer term management for his back.  Will refer to [facility] for med / intervention procedural management  Add Gabapentin titration 100 to 300mg  TID OR QHS  PDMP reviewed Ordered Oxy IR 5mg  TID PRN, #60 pills Return in 1 month to review.  Flu shot today  #Folliculitis, L elbow area Will order mupirocin antibiotic ointment BID x 2 weeks  Orders Placed This Encounter  Procedures   Flu Vaccine QUAD High Dose(Fluad)   Ambulatory referral to Pain Clinic    Referral Priority:   Routine    Referral Type:   Consultation    Referral Reason:    Specialty Services Required    Requested Specialty:   Pain Medicine    Number of Visits Requested:   1   Handicap Placard today, completed and given to patient.   Meds ordered this encounter  Medications   mupirocin cream (BACTROBAN) 2 %    Sig: Apply 1 application topically 2 (two) times daily. For 2 weeks, may repeat if need.    Dispense:  15 g    Refill:  2   oxyCODONE (OXY IR/ROXICODONE) 5 MG immediate release tablet    Sig: Take 1 tablet (5 mg total) by mouth every 8 (eight) hours as needed for severe pain.    Dispense:  60 tablet    Refill:  0   gabapentin (NEURONTIN) 100 MG capsule    Sig: Start 1 capsule daily, increase by 1 cap every 2-3 days as tolerated up to 3 times a day, or may take 3 at once in evening.    Dispense:  90 capsule    Refill:  1     Follow up plan: Return in about 4 weeks (around 03/15/2021) for 4 week follow-up Pain Management.   [name], [facility] 02/15/2021, 10:10 AM

## 2021-02-15 NOTE — Telephone Encounter (Signed)
Requested medications are due for refill today.  yes  Requested medications are on the active medications list.  yes  Last refill. 12/28/2020  Future visit scheduled.   yes  Notes to clinic.  Refill failed protocol d/t abnormal labs.    Requested Prescriptions  Pending Prescriptions Disp Refills   furosemide (LASIX) 20 MG tablet [Pharmacy Med Name: FUROSEMIDE 20 MG TAB] 30 tablet     Sig: TAKE 1/2 TO 1 TABLET BY MOUTH DAILY     Cardiovascular:  Diuretics - Loop Failed - 02/14/2021  3:07 PM      Failed - Ca in normal range and within 360 days    Calcium  Date Value Ref Range Status  12/23/2020 8.2 (L) 8.9 - 10.3 mg/dL Final   Calcium, Total  Date Value Ref Range Status  10/16/2013 8.0 (L) 8.5 - 10.1 mg/dL Final          Failed - Na in normal range and within 360 days    Sodium  Date Value Ref Range Status  12/23/2020 132 (L) 135 - 145 mmol/L Final  05/19/2015 138 134 - 144 mmol/L Final  10/16/2013 141 136 - 145 mmol/L Final          Passed - K in normal range and within 360 days    Potassium  Date Value Ref Range Status  12/25/2020 4.0 3.5 - 5.1 mmol/L Final    Comment:    Performed at [facility], [address] [place], [place] [address]  10/16/2013 3.8 3.5 - 5.1 mmol/L Final          Passed - Cr in normal range and within 360 days    Creat  Date Value Ref Range Status  10/30/2020 0.96 0.70 - 1.22 mg/dL Final   Creatinine, Ser  Date Value Ref Range Status  12/23/2020 0.70 0.61 - 1.24 mg/dL Final          Passed - Last BP in normal range    BP Readings from Last 1 Encounters:  12/27/20 128/76          Passed - Valid encounter within last 6 months    Recent Outpatient Visits           2 months ago Acute intractable headache, unspecified headache type   [place], Coralie Keens, NP   3 months ago Annual physical exam   [facility] Olin Hauser, DO   5 months ago Bilateral lower extremity  edema   [place], DO   10 months ago Intercostal muscle strain, initial encounter   [facility] Olin Hauser, DO   1 year ago AK (actinic keratosis)   [facility] [place], Devonne Doughty, DO       Future Appointments             Today [place] Devonne Doughty, [facility], [facility]

## 2021-02-15 NOTE — Telephone Encounter (Signed)
[facility] calling to advise they do not carry the  mupirocin cream (BACTROBAN) 2 % They would like to switch this to the ointment. She said the cream is also very expensive and his insurance will not cover.  This will be a  mupirocin ointment (BACTROBAN) 2 % 22 gram tube  Please resend to [place]

## 2021-02-15 NOTE — Telephone Encounter (Signed)
Fixed. I sent the ointment now. The order in Epic said that the cream was preferred. No problem, the ointment is now ordered to [facility].  [name], [facility] 02/15/2021, 1:24 PM

## 2021-02-15 NOTE — Patient Instructions (Addendum)
Thank you for coming to the office today.  [facility] clinic in [place], [place] Address: [address], [place], [place] [address] Phone: [phone]  Start Gabapentin 100mg  capsules, take at night for 2-3 nights only, and then increase to 2 times a day for a few days, and then may increase to 3 times a day, it may make you drowsy, if helps significantly at night only, then you can increase instead to 3 capsules at night, instead of 3 times a day - In the future if needed, we can significantly increase the dose if tolerated well, some common doses are 300mg  three times a day up to 600mg  three times a day, usually it takes several weeks or months to get to higher doses  Ordered Oxycodone take as needed  Please schedule a Follow-up Appointment to: Return in about 4 weeks (around 03/15/2021) for 4 week follow-up Pain Management.  If you have any other questions or concerns, please feel free to call the office or send a message through [place]. You may also schedule an earlier appointment if necessary.  Additionally, you may be receiving a survey about your experience at our office within a few days to 1 week by e-mail or mail. We value your feedback.  [name], DO [place]

## 2021-02-22 ENCOUNTER — Other Ambulatory Visit: Payer: Self-pay | Admitting: Family Medicine

## 2021-02-22 DIAGNOSIS — R350 Frequency of micturition: Secondary | ICD-10-CM

## 2021-02-22 DIAGNOSIS — N401 Enlarged prostate with lower urinary tract symptoms: Secondary | ICD-10-CM

## 2021-02-22 DIAGNOSIS — R3911 Hesitancy of micturition: Secondary | ICD-10-CM

## 2021-02-22 DIAGNOSIS — N138 Other obstructive and reflux uropathy: Secondary | ICD-10-CM

## 2021-02-22 NOTE — Telephone Encounter (Signed)
Requested Prescriptions  Pending Prescriptions Disp Refills   tamsulosin (FLOMAX) 0.4 MG CAPS capsule [Pharmacy Med Name: TAMSULOSIN HCL 0.4 MG CAP] 90 capsule 3    Sig: TAKE 1 CAPSULE BY MOUTH DAILY AFTER BREAKFAST     Urology: Alpha-Adrenergic Blocker Passed - 02/22/2021  3:37 PM      Passed - Last BP in normal range    BP Readings from Last 1 Encounters:  02/15/21 110/63         Passed - Valid encounter within last 12 months    Recent Outpatient Visits          1 week ago Chronic right-sided low back pain with bilateral sciatica   [place], DO   2 months ago Acute intractable headache, unspecified headache type   [place], Coralie Keens, NP   3 months ago Annual physical exam   [facility] Olin Hauser, DO   5 months ago Bilateral lower extremity edema   [place], DO   10 months ago Intercostal muscle strain, initial encounter   [place], DO      Future Appointments            In 3 weeks [place], Devonne Doughty, [facility], [facility]

## 2021-03-12 ENCOUNTER — Telehealth: Payer: Self-pay

## 2021-03-12 NOTE — Telephone Encounter (Signed)
Looks like referral was placed on 02/15/21  It is currently listed as PENDING.  I will route this to [facility] for review further, to check status, otherwise patient can contact   [facility] control clinic in [place], [place] Address: [address], [place], [place] [address] Phone: [phone]  [name], [facility] 03/12/2021, 12:23 PM

## 2021-03-12 NOTE — Telephone Encounter (Signed)
The pt was notified of Dr. Raliegh Ip recommendation.

## 2021-03-12 NOTE — Telephone Encounter (Signed)
Pt  brother came by said that pt have not received a call from  the pain clinic and was tole that if he didn't get a call to  follow back up  with his PCP .   Pt  call back  # is  [phone].   Thanks

## 2021-03-15 NOTE — Telephone Encounter (Signed)
I called patient back to clarify.  He states that he would still like to pursue this referral for [facility] to have them only manage back injections / procedures only. He understand no opiate pain medication from them.  Lexine Baton - can you return this update to [facility] Pain Management coordinator and get him scheduled for new patient visit?  [name], DO [facility] 03/15/2021, 1:05 PM

## 2021-03-16 ENCOUNTER — Ambulatory Visit: Payer: Medicare Other | Admitting: Family Medicine

## 2021-04-10 DIAGNOSIS — M4306 Spondylolysis, lumbar region: Secondary | ICD-10-CM | POA: Diagnosis not present

## 2021-04-10 DIAGNOSIS — M9903 Segmental and somatic dysfunction of lumbar region: Secondary | ICD-10-CM | POA: Diagnosis not present

## 2021-04-10 DIAGNOSIS — M41126 Adolescent idiopathic scoliosis, lumbar region: Secondary | ICD-10-CM | POA: Diagnosis not present

## 2021-04-10 DIAGNOSIS — M9905 Segmental and somatic dysfunction of pelvic region: Secondary | ICD-10-CM | POA: Diagnosis not present

## 2021-04-11 ENCOUNTER — Other Ambulatory Visit: Payer: Self-pay | Admitting: Family Medicine

## 2021-04-11 DIAGNOSIS — M9905 Segmental and somatic dysfunction of pelvic region: Secondary | ICD-10-CM | POA: Diagnosis not present

## 2021-04-11 DIAGNOSIS — M4306 Spondylolysis, lumbar region: Secondary | ICD-10-CM | POA: Diagnosis not present

## 2021-04-11 DIAGNOSIS — M41126 Adolescent idiopathic scoliosis, lumbar region: Secondary | ICD-10-CM | POA: Diagnosis not present

## 2021-04-11 DIAGNOSIS — E785 Hyperlipidemia, unspecified: Secondary | ICD-10-CM

## 2021-04-11 DIAGNOSIS — M9903 Segmental and somatic dysfunction of lumbar region: Secondary | ICD-10-CM | POA: Diagnosis not present

## 2021-04-11 MED ORDER — SIMVASTATIN 40 MG PO TABS: 40.0000 mg | ORAL_TABLET | Freq: Every day | ORAL | 3 refills | Status: DC

## 2021-04-13 DIAGNOSIS — M9905 Segmental and somatic dysfunction of pelvic region: Secondary | ICD-10-CM | POA: Diagnosis not present

## 2021-04-13 DIAGNOSIS — M9903 Segmental and somatic dysfunction of lumbar region: Secondary | ICD-10-CM | POA: Diagnosis not present

## 2021-04-13 DIAGNOSIS — M4306 Spondylolysis, lumbar region: Secondary | ICD-10-CM | POA: Diagnosis not present

## 2021-04-13 DIAGNOSIS — M41126 Adolescent idiopathic scoliosis, lumbar region: Secondary | ICD-10-CM | POA: Diagnosis not present

## 2021-04-16 DIAGNOSIS — M9905 Segmental and somatic dysfunction of pelvic region: Secondary | ICD-10-CM | POA: Diagnosis not present

## 2021-04-16 DIAGNOSIS — M41126 Adolescent idiopathic scoliosis, lumbar region: Secondary | ICD-10-CM | POA: Diagnosis not present

## 2021-04-16 DIAGNOSIS — M4306 Spondylolysis, lumbar region: Secondary | ICD-10-CM | POA: Diagnosis not present

## 2021-04-16 DIAGNOSIS — M9903 Segmental and somatic dysfunction of lumbar region: Secondary | ICD-10-CM | POA: Diagnosis not present

## 2021-04-18 DIAGNOSIS — M4306 Spondylolysis, lumbar region: Secondary | ICD-10-CM | POA: Diagnosis not present

## 2021-04-18 DIAGNOSIS — M41126 Adolescent idiopathic scoliosis, lumbar region: Secondary | ICD-10-CM | POA: Diagnosis not present

## 2021-04-18 DIAGNOSIS — M9903 Segmental and somatic dysfunction of lumbar region: Secondary | ICD-10-CM | POA: Diagnosis not present

## 2021-04-18 DIAGNOSIS — M9905 Segmental and somatic dysfunction of pelvic region: Secondary | ICD-10-CM | POA: Diagnosis not present

## 2021-04-19 ENCOUNTER — Other Ambulatory Visit: Payer: Self-pay

## 2021-04-19 DIAGNOSIS — M5442 Lumbago with sciatica, left side: Secondary | ICD-10-CM

## 2021-04-19 DIAGNOSIS — G8929 Other chronic pain: Secondary | ICD-10-CM

## 2021-04-19 MED ORDER — MELOXICAM 15 MG PO TABS: 15.0000 mg | ORAL_TABLET | Freq: Every day | ORAL | 2 refills | Status: DC | PRN

## 2021-04-20 DIAGNOSIS — M4306 Spondylolysis, lumbar region: Secondary | ICD-10-CM | POA: Diagnosis not present

## 2021-04-20 DIAGNOSIS — M41126 Adolescent idiopathic scoliosis, lumbar region: Secondary | ICD-10-CM | POA: Diagnosis not present

## 2021-04-20 DIAGNOSIS — M9903 Segmental and somatic dysfunction of lumbar region: Secondary | ICD-10-CM | POA: Diagnosis not present

## 2021-04-20 DIAGNOSIS — M9905 Segmental and somatic dysfunction of pelvic region: Secondary | ICD-10-CM | POA: Diagnosis not present

## 2021-04-23 DIAGNOSIS — M4306 Spondylolysis, lumbar region: Secondary | ICD-10-CM | POA: Diagnosis not present

## 2021-04-23 DIAGNOSIS — M9905 Segmental and somatic dysfunction of pelvic region: Secondary | ICD-10-CM | POA: Diagnosis not present

## 2021-04-23 DIAGNOSIS — M41126 Adolescent idiopathic scoliosis, lumbar region: Secondary | ICD-10-CM | POA: Diagnosis not present

## 2021-04-23 DIAGNOSIS — M9903 Segmental and somatic dysfunction of lumbar region: Secondary | ICD-10-CM | POA: Diagnosis not present

## 2021-04-25 DIAGNOSIS — M9903 Segmental and somatic dysfunction of lumbar region: Secondary | ICD-10-CM | POA: Diagnosis not present

## 2021-04-25 DIAGNOSIS — M9905 Segmental and somatic dysfunction of pelvic region: Secondary | ICD-10-CM | POA: Diagnosis not present

## 2021-04-25 DIAGNOSIS — M41126 Adolescent idiopathic scoliosis, lumbar region: Secondary | ICD-10-CM | POA: Diagnosis not present

## 2021-04-25 DIAGNOSIS — M4306 Spondylolysis, lumbar region: Secondary | ICD-10-CM | POA: Diagnosis not present

## 2021-04-27 DIAGNOSIS — M9903 Segmental and somatic dysfunction of lumbar region: Secondary | ICD-10-CM | POA: Diagnosis not present

## 2021-04-27 DIAGNOSIS — M9905 Segmental and somatic dysfunction of pelvic region: Secondary | ICD-10-CM | POA: Diagnosis not present

## 2021-04-27 DIAGNOSIS — M4306 Spondylolysis, lumbar region: Secondary | ICD-10-CM | POA: Diagnosis not present

## 2021-04-27 DIAGNOSIS — M41126 Adolescent idiopathic scoliosis, lumbar region: Secondary | ICD-10-CM | POA: Diagnosis not present

## 2021-05-11 ENCOUNTER — Other Ambulatory Visit: Payer: Self-pay | Admitting: *Deleted

## 2021-05-11 ENCOUNTER — Ambulatory Visit: Payer: Self-pay | Admitting: *Deleted

## 2021-05-11 ENCOUNTER — Telehealth: Payer: Self-pay | Admitting: Family Medicine

## 2021-05-11 DIAGNOSIS — M5136 Other intervertebral disc degeneration, lumbar region: Secondary | ICD-10-CM

## 2021-05-11 DIAGNOSIS — G8929 Other chronic pain: Secondary | ICD-10-CM

## 2021-05-11 MED ORDER — GABAPENTIN 100 MG PO CAPS: ORAL_CAPSULE | ORAL | 1 refills | Status: DC

## 2021-05-11 MED ORDER — OXYCODONE HCL 5 MG PO TABS: 5.0000 mg | ORAL_TABLET | Freq: Three times a day (TID) | ORAL | 0 refills | Status: DC | PRN

## 2021-05-11 NOTE — Telephone Encounter (Signed)
Faythe Ghee I will agree to re order medicine for now still while we are waiting on initial Pain Clinic Apt. ? ?I have routed this message over to [facility] for their review to help with scheduling this patient, as he has reported calling them multiple times. ? ?Thank you! ? ?CC chart to Etter Sjogren for review. Thank you! ? ?[name], DO ?[facility] ?[facility] ?05/11/2021, 4:21 PM ? ?

## 2021-05-11 NOTE — Telephone Encounter (Signed)
Pt stated he hasnt heard from the pain clinic yet and was advised if he was still having pain to call and get a refill for oxyCODONE (OXY IR/ROXICODONE) 5 MG immediate release tablet send to [place] [place]  ?[address] [place], [place] [address]  ?Phone:  [phone]  Fax:  [phone] ?

## 2021-05-11 NOTE — Telephone Encounter (Signed)
Rx has been routed to the provider in another encounter today. Awaiting approval. ?

## 2021-05-11 NOTE — Telephone Encounter (Signed)
?  Chief Complaint: back pain ?Symptoms: severe back pain ?Frequency: constant ?Pertinent Negatives: Patient denies changes in condition ?Disposition: '[]'$ ED /'[]'$ Urgent Care (no appt availability in office) / '[]'$ Appointment(In office/virtual)/ '[]'$  [facility]/ '[x]'$ Home Care/ '[]'$ Refused Recommended Disposition /'[]'$ [facility]/ '[x]'$  Follow-up with PCP ?Additional Notes: Pain clinic has not called yet, has made 1 month of pain med last 2 months but now is out and still can not get appt with pain clinic, has called multiple times. Requesting refill, I will send refill through.  ? ? ?Reason for Disposition ? Back pain is a chronic symptom (recurrent or ongoing AND present > 4 weeks) ? ?Answer Assessment - Initial Assessment Questions ?1. ONSET: "When did the pain begin?"  ?    A long time ago, pain clinic has not called yet ?2. LOCATION: "Where does it hurt?" (upper, mid or lower back) ?    Lower back ?3. SEVERITY: "How bad is the pain?"  (e.g., Scale 1-10; mild, moderate, or severe) ?  - MILD (1-3): doesn't interfere with normal activities  ?  - MODERATE (4-7): interferes with normal activities or awakens from sleep  ?  - SEVERE (8-10): excruciating pain, unable to do any normal activities  ?    9 ?4. PATTERN: "Is the pain constant?" (e.g., yes, no; constant, intermittent)  ?    constant ?5. RADIATION: "Does the pain shoot into your legs or elsewhere?" ?    legs ?6. CAUSE:  "What do you think is causing the back pain?"  ?    Had for a long time ?7. BACK OVERUSE:  "Any recent lifting of heavy objects, strenuous work or exercise?" ?    no ?8. MEDICATIONS: "What have you taken so far for the pain?" (e.g., nothing, acetaminophen, NSAIDS) ?    Got one month worth of pain med, lasted two months but now out ? ?Protocols used: Back Pain-A-AH ? ?

## 2021-05-11 NOTE — Telephone Encounter (Signed)
Requested Prescriptions  ?Pending Prescriptions Disp Refills  ?? oxyCODONE (OXY IR/ROXICODONE) 5 MG immediate release tablet 60 tablet 0  ?  Sig: Take 1 tablet (5 mg total) by mouth every 8 (eight) hours as needed for severe pain.  ?  ? Not Delegated - Analgesics:  Opioid Agonists Failed - 05/11/2021  3:06 PM  ?  ?  Failed - This refill cannot be delegated  ?  ?  Failed - Urine Drug Screen completed in last 360 days  ?  ?  Passed - Valid encounter within last 3 months  ?  Recent Outpatient Visits   ?      ? 2 months ago Chronic right-sided low back pain with bilateral sciatica  ? [place], DO  ? 4 months ago Acute intractable headache, unspecified headache type  ? [facility] [place], Coralie Keens, NP  ? 6 months ago Annual physical exam  ? [place], DO  ? 8 months ago Bilateral lower extremity edema  ? [place], DO  ? 1 year ago Intercostal muscle strain, initial encounter  ? [place], DO  ?  ?  ? ?  ?  ?  ?? gabapentin (NEURONTIN) 100 MG capsule 90 capsule 1  ?  Sig: Start 1 capsule daily, increase by 1 cap every 2-3 days as tolerated up to 3 times a day, or may take 3 at once in evening.  ?  ? Neurology: Anticonvulsants - gabapentin Passed - 05/11/2021  3:06 PM  ?  ?  Passed - Cr in normal range and within 360 days  ?  Creat  ?Date Value Ref Range Status  ?10/30/2020 0.96 0.70 - 1.22 mg/dL Final  ? ?Creatinine, Ser  ?Date Value Ref Range Status  ?12/23/2020 0.70 0.61 - 1.24 mg/dL Final  ?   ?  ?  Passed - Completed PHQ-2 or PHQ-9 in the last 360 days  ?  ?  Passed - Valid encounter within last 12 months  ?  Recent Outpatient Visits   ?      ? 2 months ago Chronic right-sided low back pain with bilateral sciatica  ? [place], DO  ? 4 months ago Acute intractable headache, unspecified  headache type  ? [facility] [place], Coralie Keens, NP  ? 6 months ago Annual physical exam  ? [place], DO  ? 8 months ago Bilateral lower extremity edema  ? [place], DO  ? 1 year ago Intercostal muscle strain, initial encounter  ? [place], DO  ?  ?  ? ?  ?  ?  ? ? ?

## 2021-05-11 NOTE — Telephone Encounter (Signed)
Requested medication (s) are due for refill today: yes ? ?Requested medication (s) are on the active medication list: yes ? ?Last refill:  02/15/21 #60 with 0 RF ? ?Future visit scheduled: no, see  02/15/21 ? ?Notes to clinic:  See triage message below ?: Pain clinic has not called yet, has made 1 month of pain med last 2 months but now is out and still can not get appt with pain clinic, has called multiple times. Requesting refill. ? ? ?  ? ?Requested Prescriptions  ?Pending Prescriptions Disp Refills  ? oxyCODONE (OXY IR/ROXICODONE) 5 MG immediate release tablet 60 tablet 0  ?  Sig: Take 1 tablet (5 mg total) by mouth every 8 (eight) hours as needed for severe pain.  ?  ? Not Delegated - Analgesics:  Opioid Agonists Failed - 05/11/2021  3:06 PM  ?  ?  Failed - This refill cannot be delegated  ?  ?  Failed - Urine Drug Screen completed in last 360 days  ?  ?  Passed - Valid encounter within last 3 months  ?  Recent Outpatient Visits   ? ?      ? 2 months ago Chronic right-sided low back pain with bilateral sciatica  ? [place], DO  ? 4 months ago Acute intractable headache, unspecified headache type  ? [facility], Coralie Keens, NP  ? 6 months ago Annual physical exam  ? [place], DO  ? 8 months ago Bilateral lower extremity edema  ? [place], DO  ? 1 year ago Intercostal muscle strain, initial encounter  ? [place], DO  ? ?  ?  ? ?  ?  ?  ?Signed Prescriptions Disp Refills  ? gabapentin (NEURONTIN) 100 MG capsule 90 capsule 1  ?  Sig: Start 1 capsule daily, increase by 1 cap every 2-3 days as tolerated up to 3 times a day, or may take 3 at once in evening.  ?  ? Neurology: Anticonvulsants - gabapentin Passed - 05/11/2021  3:06 PM  ?  ?  Passed - Cr in normal range and within 360 days  ?  Creat  ?Date Value Ref Range Status   ?10/30/2020 0.96 0.70 - 1.22 mg/dL Final  ? ?Creatinine, Ser  ?Date Value Ref Range Status  ?12/23/2020 0.70 0.61 - 1.24 mg/dL Final  ?  ?  ?  ?  Passed - Completed PHQ-2 or PHQ-9 in the last 360 days  ?  ?  Passed - Valid encounter within last 12 months  ?  Recent Outpatient Visits   ? ?      ? 2 months ago Chronic right-sided low back pain with bilateral sciatica  ? [place], DO  ? 4 months ago Acute intractable headache, unspecified headache type  ? [facility] [place], Coralie Keens, NP  ? 6 months ago Annual physical exam  ? [place], DO  ? 8 months ago Bilateral lower extremity edema  ? [place], DO  ? 1 year ago Intercostal muscle strain, initial encounter  ? [place], DO  ? ?  ?  ? ?  ?  ?  ? ? ?

## 2021-05-11 NOTE — Telephone Encounter (Signed)
Pt advised.   Thanks,   -Robbyn Hodkinson  

## 2021-05-14 ENCOUNTER — Other Ambulatory Visit: Payer: Self-pay | Admitting: Family Medicine

## 2021-05-15 NOTE — Telephone Encounter (Signed)
Requested medication (s) are due for refill today: yes ? ?Requested medication (s) are on the active medication list: yes ? ?Last refill:  02/15/21 #30/2 ? ?Future visit scheduled: no ? ?Notes to clinic:  Unable to refill per protocol due to failed labs, no updated results. ? ? ? ?  ?Requested Prescriptions  ?Pending Prescriptions Disp Refills  ? furosemide (LASIX) 20 MG tablet [Pharmacy Med Name: FUROSEMIDE 20 MG TAB] 30 tablet 2  ?  Sig: TAKE 1/2 TO 1 TABLET BY MOUTH DAILY  ?  ? Cardiovascular:  Diuretics - Loop Failed - 05/14/2021  9:07 AM  ?  ?  Failed - Ca in normal range and within 180 days  ?  Calcium  ?Date Value Ref Range Status  ?12/23/2020 8.2 (L) 8.9 - 10.3 mg/dL Final  ? ?Calcium, Total  ?Date Value Ref Range Status  ?10/16/2013 8.0 (L) 8.5 - 10.1 mg/dL Final  ?  ?  ?  ?  Failed - Na in normal range and within 180 days  ?  Sodium  ?Date Value Ref Range Status  ?12/23/2020 132 (L) 135 - 145 mmol/L Final  ?05/19/2015 138 134 - 144 mmol/L Final  ?10/16/2013 141 136 - 145 mmol/L Final  ?  ?  ?  ?  Failed - Mg Level in normal range and within 180 days  ?  Magnesium  ?Date Value Ref Range Status  ?04/22/2012 1.8 mg/dL Final  ?  Comment:  ?  1.8-2.4 ?THERAPEUTIC RANGE: 4-7 mg/dL ?TOXIC: > 10 mg/dL ? ----------------------- ?  ?  ?  ?  ?  Passed - K in normal range and within 180 days  ?  Potassium  ?Date Value Ref Range Status  ?12/25/2020 4.0 3.5 - 5.1 mmol/L Final  ?  Comment:  ?  Performed at [facility], [address] [place], [place] [address]  ?10/16/2013 3.8 3.5 - 5.1 mmol/L Final  ?  ?  ?  ?  Passed - Cr in normal range and within 180 days  ?  Creat  ?Date Value Ref Range Status  ?10/30/2020 0.96 0.70 - 1.22 mg/dL Final  ? ?Creatinine, Ser  ?Date Value Ref Range Status  ?12/23/2020 0.70 0.61 - 1.24 mg/dL Final  ?  ?  ?  ?  Passed - Cl in normal range and within 180 days  ?  Chloride  ?Date Value Ref Range Status  ?12/23/2020 100 98 - 111 mmol/L Final  ?10/16/2013 107 98 - 107 mmol/L Final  ?   ?  ?  ?  Passed - Last BP in normal range  ?  BP Readings from Last 1 Encounters:  ?02/15/21 110/63  ?  ?  ?  ?  Passed - Valid encounter within last 6 months  ?  Recent Outpatient Visits   ? ?      ? 2 months ago Chronic right-sided low back pain with bilateral sciatica  ? [place], DO  ? 5 months ago Acute intractable headache, unspecified headache type  ? [facility] [place], Coralie Keens, NP  ? 6 months ago Annual physical exam  ? [place], DO  ? 8 months ago Bilateral lower extremity edema  ? [place], DO  ? 1 year ago Intercostal muscle strain, initial encounter  ? [place], DO  ? ?  ?  ? ?  ?  ?  ? ?

## 2021-05-24 DIAGNOSIS — K219 Gastro-esophageal reflux disease without esophagitis: Secondary | ICD-10-CM | POA: Insufficient documentation

## 2021-05-24 DIAGNOSIS — D485 Neoplasm of uncertain behavior of skin: Secondary | ICD-10-CM | POA: Insufficient documentation

## 2021-05-24 DIAGNOSIS — F17200 Nicotine dependence, unspecified, uncomplicated: Secondary | ICD-10-CM | POA: Insufficient documentation

## 2021-05-24 DIAGNOSIS — L570 Actinic keratosis: Secondary | ICD-10-CM | POA: Insufficient documentation

## 2021-05-24 DIAGNOSIS — R0789 Other chest pain: Secondary | ICD-10-CM | POA: Insufficient documentation

## 2021-05-24 DIAGNOSIS — L821 Other seborrheic keratosis: Secondary | ICD-10-CM | POA: Insufficient documentation

## 2021-05-24 DIAGNOSIS — IMO0001 Reserved for inherently not codable concepts without codable children: Secondary | ICD-10-CM | POA: Insufficient documentation

## 2021-05-24 DIAGNOSIS — I7102 Dissection of abdominal aorta: Secondary | ICD-10-CM | POA: Insufficient documentation

## 2021-05-24 DIAGNOSIS — R69 Illness, unspecified: Secondary | ICD-10-CM | POA: Insufficient documentation

## 2021-05-24 DIAGNOSIS — Z0000 Encounter for general adult medical examination without abnormal findings: Secondary | ICD-10-CM | POA: Insufficient documentation

## 2021-05-24 DIAGNOSIS — C44311 Basal cell carcinoma of skin of nose: Secondary | ICD-10-CM | POA: Insufficient documentation

## 2021-05-24 DIAGNOSIS — Z85828 Personal history of other malignant neoplasm of skin: Secondary | ICD-10-CM | POA: Insufficient documentation

## 2021-05-24 DIAGNOSIS — H6990 Unspecified Eustachian tube disorder, unspecified ear: Secondary | ICD-10-CM | POA: Insufficient documentation

## 2021-05-24 DIAGNOSIS — F410 Panic disorder [episodic paroxysmal anxiety] without agoraphobia: Secondary | ICD-10-CM | POA: Insufficient documentation

## 2021-05-24 DIAGNOSIS — B079 Viral wart, unspecified: Secondary | ICD-10-CM | POA: Insufficient documentation

## 2021-05-24 DIAGNOSIS — Z860100 Personal history of colon polyps, unspecified: Secondary | ICD-10-CM | POA: Insufficient documentation

## 2021-05-24 DIAGNOSIS — Z7189 Other specified counseling: Secondary | ICD-10-CM | POA: Insufficient documentation

## 2021-05-24 DIAGNOSIS — I119 Hypertensive heart disease without heart failure: Secondary | ICD-10-CM | POA: Insufficient documentation

## 2021-05-25 ENCOUNTER — Ambulatory Visit: Payer: Medicaid Other

## 2021-05-28 ENCOUNTER — Other Ambulatory Visit: Payer: Self-pay | Admitting: Family Medicine

## 2021-05-28 DIAGNOSIS — R809 Proteinuria, unspecified: Secondary | ICD-10-CM

## 2021-05-28 DIAGNOSIS — M5136 Other intervertebral disc degeneration, lumbar region: Secondary | ICD-10-CM

## 2021-05-28 DIAGNOSIS — I10 Essential (primary) hypertension: Secondary | ICD-10-CM

## 2021-05-28 DIAGNOSIS — G8929 Other chronic pain: Secondary | ICD-10-CM

## 2021-05-29 ENCOUNTER — Other Ambulatory Visit: Payer: Self-pay | Admitting: Family Medicine

## 2021-05-29 DIAGNOSIS — R809 Proteinuria, unspecified: Secondary | ICD-10-CM

## 2021-05-29 DIAGNOSIS — I10 Essential (primary) hypertension: Secondary | ICD-10-CM

## 2021-05-29 NOTE — Telephone Encounter (Signed)
Requested medication (s) are due for refill today: no ? ?Requested medication (s) are on the active medication list: yes ? ?Last refill:  05/11/21 ? ?Future visit scheduled: no ? ?Notes to clinic:  Unable to refill per protocol, cannot delegate. ? ? ? ?  ?Requested Prescriptions  ?Pending Prescriptions Disp Refills  ? oxyCODONE (OXY IR/ROXICODONE) 5 MG immediate release tablet [Pharmacy Med Name: OXYCODONE HCL 5 MG TAB] 60 tablet   ?  Sig: TAKE 1 TABLET BY MOUTH EVERY 8 HOURS AS NEEDED FOR SEVERE PAIN.  ?  ? Not Delegated - Analgesics:  Opioid Agonists Failed - 05/28/2021 12:58 PM  ?  ?  Failed - This refill cannot be delegated  ?  ?  Failed - Urine Drug Screen completed in last 360 days  ?  ?  Failed - Valid encounter within last 3 months  ?  Recent Outpatient Visits   ? ?      ? 3 months ago Chronic right-sided low back pain with bilateral sciatica  ? [place], DO  ? 5 months ago Acute intractable headache, unspecified headache type  ? [facility] [place], Coralie Keens, NP  ? 6 months ago Annual physical exam  ? [place], DO  ? 8 months ago Bilateral lower extremity edema  ? [place], DO  ? 1 year ago Intercostal muscle strain, initial encounter  ? [place], DO  ? ?  ?  ? ? ?  ?  ?  ?Signed Prescriptions Disp Refills  ? losartan (COZAAR) 25 MG tablet 90 tablet 1  ?  Sig: TAKE 1 TABLET BY MOUTH DAILY  ?  ? Cardiovascular:  Angiotensin Receptor Blockers Passed - 05/28/2021 12:58 PM  ?  ?  Passed - Cr in normal range and within 180 days  ?  Creat  ?Date Value Ref Range Status  ?10/30/2020 0.96 0.70 - 1.22 mg/dL Final  ? ?Creatinine, Ser  ?Date Value Ref Range Status  ?12/23/2020 0.70 0.61 - 1.24 mg/dL Final  ?  ?  ?  ?  Passed - K in normal range and within 180 days  ?  Potassium  ?Date Value Ref Range Status  ?12/25/2020 4.0 3.5 - 5.1  mmol/L Final  ?  Comment:  ?  Performed at [facility], [address] [place], [place] [address]  ?10/16/2013 3.8 3.5 - 5.1 mmol/L Final  ?  ?  ?  ?  Passed - Patient is not pregnant  ?  ?  Passed - Last BP in normal range  ?  BP Readings from Last 1 Encounters:  ?02/15/21 110/63  ?  ?  ?  ?  Passed - Valid encounter within last 6 months  ?  Recent Outpatient Visits   ? ?      ? 3 months ago Chronic right-sided low back pain with bilateral sciatica  ? [place], DO  ? 5 months ago Acute intractable headache, unspecified headache type  ? [facility] [place], Coralie Keens, NP  ? 6 months ago Annual physical exam  ? [place], DO  ? 8 months ago Bilateral lower extremity edema  ? [place], DO  ? 1 year ago Intercostal muscle strain, initial encounter  ? [facility] [place], Sheppard Coil  J, DO  ? ?  ?  ? ? ?  ?  ?  ? ? ?

## 2021-05-29 NOTE — Telephone Encounter (Signed)
Requested Prescriptions  ?Pending Prescriptions Disp Refills  ?? oxyCODONE (OXY IR/ROXICODONE) 5 MG immediate release tablet [Pharmacy Med Name: OXYCODONE HCL 5 MG TAB] 60 tablet   ?  Sig: TAKE 1 TABLET BY MOUTH EVERY 8 HOURS AS NEEDED FOR SEVERE PAIN.  ?  ? Not Delegated - Analgesics:  Opioid Agonists Failed - 05/28/2021 12:58 PM  ?  ?  Failed - This refill cannot be delegated  ?  ?  Failed - Urine Drug Screen completed in last 360 days  ?  ?  Failed - Valid encounter within last 3 months  ?  Recent Outpatient Visits   ?      ? 3 months ago Chronic right-sided low back pain with bilateral sciatica  ? [place], DO  ? 5 months ago Acute intractable headache, unspecified headache type  ? [facility] [place], Coralie Keens, NP  ? 6 months ago Annual physical exam  ? [place], DO  ? 8 months ago Bilateral lower extremity edema  ? [place], DO  ? 1 year ago Intercostal muscle strain, initial encounter  ? [facility] [place], Devonne Doughty, DO  ?  ?  ? ?  ?  ?  ?? losartan (COZAAR) 25 MG tablet [Pharmacy Med Name: LOSARTAN POTASSIUM 25 MG TAB] 90 tablet 1  ?  Sig: TAKE 1 TABLET BY MOUTH DAILY  ?  ? Cardiovascular:  Angiotensin Receptor Blockers Passed - 05/28/2021 12:58 PM  ?  ?  Passed - Cr in normal range and within 180 days  ?  Creat  ?Date Value Ref Range Status  ?10/30/2020 0.96 0.70 - 1.22 mg/dL Final  ? ?Creatinine, Ser  ?Date Value Ref Range Status  ?12/23/2020 0.70 0.61 - 1.24 mg/dL Final  ?   ?  ?  Passed - K in normal range and within 180 days  ?  Potassium  ?Date Value Ref Range Status  ?12/25/2020 4.0 3.5 - 5.1 mmol/L Final  ?  Comment:  ?  Performed at [facility], [address] [place],  [address]  ?10/16/2013 3.8 3.5 - 5.1 mmol/L Final  ?   ?  ?  Passed - Patient is not pregnant  ?  ?  Passed - Last BP in normal range  ?  BP  Readings from Last 1 Encounters:  ?02/15/21 110/63  ?   ?  ?  Passed - Valid encounter within last 6 months  ?  Recent Outpatient Visits   ?      ? 3 months ago Chronic right-sided low back pain with bilateral sciatica  ? [place], DO  ? 5 months ago Acute intractable headache, unspecified headache type  ? [facility] [place], Coralie Keens, NP  ? 6 months ago Annual physical exam  ? [place], DO  ? 8 months ago Bilateral lower extremity edema  ? [place], DO  ? 1 year ago Intercostal muscle strain, initial encounter  ? [place], DO  ?  ?  ? ?  ?  ?  ? ? ?

## 2021-05-29 NOTE — Telephone Encounter (Signed)
Refilled 05/29/2021 #90 1 refill. ?Requested Prescriptions  ?Pending Prescriptions Disp Refills  ?? losartan (COZAAR) 25 MG tablet [Pharmacy Med Name: LOSARTAN POTASSIUM 25 MG TAB] 90 tablet 1  ?  Sig: TAKE 1 TABLET BY MOUTH DAILY  ?  ? Cardiovascular:  Angiotensin Receptor Blockers Passed - 05/29/2021 10:25 AM  ?  ?  Passed - Cr in normal range and within 180 days  ?  Creat  ?Date Value Ref Range Status  ?10/30/2020 0.96 0.70 - 1.22 mg/dL Final  ? ?Creatinine, Ser  ?Date Value Ref Range Status  ?12/23/2020 0.70 0.61 - 1.24 mg/dL Final  ?   ?  ?  Passed - K in normal range and within 180 days  ?  Potassium  ?Date Value Ref Range Status  ?12/25/2020 4.0 3.5 - 5.1 mmol/L Final  ?  Comment:  ?  Performed at [facility], [address] [place], [place] [address]  ?10/16/2013 3.8 3.5 - 5.1 mmol/L Final  ?   ?  ?  Passed - Patient is not pregnant  ?  ?  Passed - Last BP in normal range  ?  BP Readings from Last 1 Encounters:  ?02/15/21 110/63  ?   ?  ?  Passed - Valid encounter within last 6 months  ?  Recent Outpatient Visits   ?      ? 3 months ago Chronic right-sided low back pain with bilateral sciatica  ? [place], DO  ? 5 months ago Acute intractable headache, unspecified headache type  ? [facility] [place], Coralie Keens, NP  ? 6 months ago Annual physical exam  ? [place], DO  ? 8 months ago Bilateral lower extremity edema  ? [place], DO  ? 1 year ago Intercostal muscle strain, initial encounter  ? [place], DO  ?  ?  ? ?  ?  ?  ? ?

## 2021-06-12 DIAGNOSIS — Z789 Other specified health status: Secondary | ICD-10-CM | POA: Insufficient documentation

## 2021-06-12 DIAGNOSIS — Z79899 Other long term (current) drug therapy: Secondary | ICD-10-CM | POA: Insufficient documentation

## 2021-06-12 DIAGNOSIS — F32A Depression, unspecified: Secondary | ICD-10-CM | POA: Insufficient documentation

## 2021-06-12 DIAGNOSIS — M899 Disorder of bone, unspecified: Secondary | ICD-10-CM | POA: Insufficient documentation

## 2021-06-12 DIAGNOSIS — G894 Chronic pain syndrome: Secondary | ICD-10-CM | POA: Insufficient documentation

## 2021-06-12 NOTE — Progress Notes (Signed)
Patient: Charles Ewing  Service Category: E/M  Provider: Gaspar Cola, MD  ?DOB: 1941/01/31  DOS: 06/13/2021  Referring Provider: [name] *  ?MRN: [id number]  Setting: Ambulatory outpatient  PCP: Olin Hauser, DO  ?Type: New Patient  Specialty: Interventional Pain Management    ?Location: Office  Delivery: Face-to-face    ? ?Primary Reason(s) for Visit: Encounter for initial evaluation of one or more chronic problems (new to examiner) potentially causing chronic pain, and posing a threat to normal musculoskeletal function. (Level of risk: High) ?CC: Back Pain (lower) and Knee Pain ? ?HPI  ?Charles Ewing is a 81 y.o. year old, male patient, who comes for the first time to our practice referred by [name] * for our initial evaluation of his chronic pain. He has Orthostatic hypotension; Elevated prostate specific antigen (PSA); Benign prostatic hyperplasia with urinary obstruction; False passage of urethra; Prediabetes; Hyperlipidemia; Centrilobular emphysema ([place]); Abdominal aortic aneurysm (AAA) ([place]); Benign hypertension with CKD (chronic kidney disease) stage III ([place]); Left sided chest pain; Coronary artery disease; Osteoarthritis of knees (Bilateral); Slow transit constipation; Anxiety; Insomnia; Irregular heart rhythm; SOB (shortness of breath) on exertion; Venous insufficiency of both lower extremities; Bilateral lower extremity edema; PTSD (post-traumatic stress disorder); COPD with acute exacerbation ([place]); Depression; Hypertension; COVID-19 virus infection; Nicotine dependence; Cardiomyopathy ([place]); Seborrheic keratosis; Actinic keratosis; 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[place]); Basal cell carcinoma of nose; Disorder of eustachian tube; Esophageal reflux; History of colonic polyps; History of malignant neoplasm of skin; Hypertensive heart disease without congestive heart failure; Neoplasm of uncertain behavior of skin; Other ill-defined and unknown causes of morbidity  and mortality; Other specified counseling; Reason for consultation; Tobacco use disorder; Verruca vulgaris; COPD (chronic obstructive pulmonary disease) ([place]); Atypical chest pain; Panic attack; Panic disorder; Depressive disorder; Chronic pain syndrome; Pharmacologic therapy; Disorder of skeletal system; Problems influencing health status; Long term prescription benzodiazepine use; Chronic use of opiate for therapeutic purpose; Long term current use of non-steroidal anti-inflammatories (NSAID); Chronic hip pain (2ry area of Pain) (Right); Chronic lower extremity pain (3ry area of Pain) (Right); Chronic cardiopulmonary disease ([place]); Chronic low back pain (1ry area of Pain) (Bilateral) (R>L) w/o sciatica; and Lumbar facet syndrome (Right) on their problem list. Today he comes in for evaluation of his Back Pain (lower) and Knee Pain ? ?Pain Assessment: ?Location: Lower, Right Back ?Radiating: hip/buttock on right. down sideand back of leg to knee ?Onset: More than a month ago ?Duration: Chronic pain ?Quality: Aching, Constant, Discomfort, Sharp, Throbbing, Stabbing ?Severity: 10-Worst pain ever/10 (subjective, self-reported pain score)  ?Effect on ADL: prolonged walking standing, daily actiivites ?Timing: Constant ?Modifying factors: rest, ice ?BP: (!) 119/94  HR: 74 ? ?Onset and Duration: Sudden and Present longer than 3 months ?Cause of pain: Unknown ?Severity: No change since onset, Charles-11 at its worse: 10/10, Charles-11 at its best: 10/10, Charles-11 now: 10/10, and Charles-11 on the average: 8/10 ?Timing: Morning, During activity or exercise, and After activity or exercise ?Aggravating Factors: Bending, Kneeling, Motion, Prolonged sitting, and Walking ?Alleviating Factors: Cold packs and Lying down ?Associated Problems: Day-time cramps, Depression, Dizziness, Numbness, Swelling, Tingling, and Pain that does not allow patient to sleep ?Quality of Pain: Intermittent, Disabling, Sharp, Shooting, and Toothache-like ?Previous  Examinations or Tests: MRI scan, X-rays, and Orthopedic evaluation ?Previous Treatments: Chiropractic manipulations, Epidural steroid injections, and TENS ? ?The patient comes into the clinic today accompanied by his son.  According to the patient the primary area of pain is that of the lower back (Bilateral) (  R>L).  He denies having had any back surgeries or physical therapy, but he does indicate having had some injections into the lower back and an MRI, both of which were done at the [facility] practice.  Apparently he was treated there by Paulla Dolly., PA-C and Lamount Cranker, MD. ? ?The patient's secondary area pain is that of the hip (Right).  Again he denies any surgeries, joint injections, nerve blocks, recent x-rays, or physical therapy. ? ?The patient's third area pain is that of the right lower extremity.  He refers that the pain runs down the back of the upper leg down to the knee.  He denies any type of numbness or weakness below the knee.  However, he does report bilateral feet edema, which is known to be secondary to his cardiovascular disease and for which he takes a diuretic.  Based on the distribution of this pain, it does not seem to be radicular.  Further evaluation would suggest the pain to be referred from the area of the right lumbar facets. ? ?Physical exam: The patient had exact reproduction of his right-sided low back pain with hyperextension and rotation towards the right side.  Positive Kemps maneuver for lumbar facet arthralgia with exact reproduction of the patient's pain upon going towards the right side. ? ?Pharmacotherapy: The patient indicates recently having started taking oxycodone IR 5 mg every 8 hours.  He also takes gabapentin 100 mg p.o. 3 times daily.  The patient does not report any side effects or adverse reactions to these medicines. ? ?Imaging: Although the patient does indicate having had an MRI of the lumbar spine, this was apparently done at a mobile unit at  [facility] and for this reason, it is not within the electronic medical record.  I have checked "care everywhere" but the results were nowhere to be found.  We have requested the patient to sign some release forms so that we can request these studies and the notes from the nerve blocks from [facility] (Tel. [phone]) (Fax. [phone]) ? ?Today I took the time to provide the patient with information regarding my pain practice. The patient was informed that my practice is divided into two sections: an interventional pain management section, as well as a completely separate and distinct medication management section. I explained that I have procedure days for my interventional therapies, and evaluation days for follow-ups and medication management. Because of the amount of documentation required during both, they are kept separated. This means that there is the possibility that he may be scheduled for a procedure on one day, and medication management the next. I have also informed him that because of staffing and facility limitations, I no longer take patients for medication management only. To illustrate the reasons for this, I gave the patient the example of surgeons, and how inappropriate it would be to refer a patient to his/her care, just to write for the post-surgical antibiotics on a surgery done by a different surgeon.  ? ?Because interventional pain management is my board-certified specialty, the patient was informed that joining my practice means that they are open to any and all interventional therapies. I made it clear that this does not mean that they will be forced to have any procedures done. What this means is that I believe interventional therapies to be essential part of the diagnosis and proper management of chronic pain conditions. Therefore, patients not interested in these interventional alternatives will be better served under the care of a different practitioner. ? ?  The patient was also  made aware of my Comprehensive Pain Management Safety Guidelines where by joining my practice, they limit all of their nerve blocks and joint injections to those done by our practice, for as long as we are

## 2021-06-13 ENCOUNTER — Ambulatory Visit
Admission: RE | Admit: 2021-06-13 | Discharge: 2021-06-13 | Disposition: A | Discharge status: Home / Self Care | Payer: Medicare Other | Source: Ambulatory Visit | Attending: Pain Medicine | Admitting: Pain Medicine

## 2021-06-13 ENCOUNTER — Ambulatory Visit
Admission: RE | Admit: 2021-06-13 | Discharge: 2021-06-13 | Disposition: A | Discharge status: Home / Self Care | Payer: Medicare Other | Attending: Pain Medicine | Admitting: Pain Medicine

## 2021-06-13 ENCOUNTER — Encounter: Payer: Self-pay | Admitting: Pain Medicine

## 2021-06-13 ENCOUNTER — Ambulatory Visit (HOSPITAL_BASED_OUTPATIENT_CLINIC_OR_DEPARTMENT_OTHER): Payer: Medicare Other | Admitting: Pain Medicine

## 2021-06-13 VITALS — BP 119/94 | HR 74 | Temp 97.2°F | Resp 16 | Ht 70.5 in | Wt 200.0 lb

## 2021-06-13 DIAGNOSIS — I279 Pulmonary heart disease, unspecified: Secondary | ICD-10-CM | POA: Insufficient documentation

## 2021-06-13 DIAGNOSIS — Z79891 Long term (current) use of opiate analgesic: Secondary | ICD-10-CM | POA: Insufficient documentation

## 2021-06-13 DIAGNOSIS — M545 Low back pain, unspecified: Secondary | ICD-10-CM | POA: Insufficient documentation

## 2021-06-13 DIAGNOSIS — M25551 Pain in right hip: Secondary | ICD-10-CM | POA: Insufficient documentation

## 2021-06-13 DIAGNOSIS — Z791 Long term (current) use of non-steroidal anti-inflammatories (NSAID): Secondary | ICD-10-CM | POA: Insufficient documentation

## 2021-06-13 DIAGNOSIS — M8588 Other specified disorders of bone density and structure, other site: Secondary | ICD-10-CM | POA: Diagnosis not present

## 2021-06-13 DIAGNOSIS — M47816 Spondylosis without myelopathy or radiculopathy, lumbar region: Secondary | ICD-10-CM | POA: Insufficient documentation

## 2021-06-13 DIAGNOSIS — G8929 Other chronic pain: Secondary | ICD-10-CM | POA: Insufficient documentation

## 2021-06-13 DIAGNOSIS — Z79899 Other long term (current) drug therapy: Secondary | ICD-10-CM | POA: Insufficient documentation

## 2021-06-13 DIAGNOSIS — M79604 Pain in right leg: Secondary | ICD-10-CM

## 2021-06-13 DIAGNOSIS — M419 Scoliosis, unspecified: Secondary | ICD-10-CM | POA: Diagnosis not present

## 2021-06-13 DIAGNOSIS — G894 Chronic pain syndrome: Secondary | ICD-10-CM | POA: Insufficient documentation

## 2021-06-13 DIAGNOSIS — Z789 Other specified health status: Secondary | ICD-10-CM

## 2021-06-13 DIAGNOSIS — M899 Disorder of bone, unspecified: Secondary | ICD-10-CM | POA: Insufficient documentation

## 2021-06-13 DIAGNOSIS — I739 Peripheral vascular disease, unspecified: Secondary | ICD-10-CM | POA: Diagnosis not present

## 2021-06-13 DIAGNOSIS — I700 Atherosclerosis of aorta: Secondary | ICD-10-CM | POA: Diagnosis not present

## 2021-06-13 DIAGNOSIS — M2578 Osteophyte, vertebrae: Secondary | ICD-10-CM | POA: Diagnosis not present

## 2021-06-13 DIAGNOSIS — M1611 Unilateral primary osteoarthritis, right hip: Secondary | ICD-10-CM | POA: Diagnosis not present

## 2021-06-13 NOTE — Progress Notes (Signed)
Safety precautions to be maintained throughout the outpatient stay will include: orient to surroundings, keep bed in low position, maintain call bell within reach at all times, provide assistance with transfer out of bed and ambulation.  

## 2021-06-14 DIAGNOSIS — Z79899 Other long term (current) drug therapy: Secondary | ICD-10-CM | POA: Diagnosis not present

## 2021-06-14 DIAGNOSIS — G894 Chronic pain syndrome: Secondary | ICD-10-CM | POA: Diagnosis not present

## 2021-06-19 LAB — COMP. METABOLIC PANEL (12)
AST: 13 IU/L (ref 0–40)
Albumin/Globulin Ratio: 1.9 (ref 1.2–2.2)
Albumin: 3.9 g/dL (ref 3.6–4.6)
Alkaline Phosphatase: 70 IU/L (ref 44–121)
BUN/Creatinine Ratio: 15 (ref 10–24)
BUN: 18 mg/dL (ref 8–27)
Bilirubin Total: 0.5 mg/dL (ref 0.0–1.2)
Calcium: 8.8 mg/dL (ref 8.6–10.2)
Chloride: 102 mmol/L (ref 96–106)
Creatinine, Ser: 1.18 mg/dL (ref 0.76–1.27)
Globulin, Total: 2.1 g/dL (ref 1.5–4.5)
Glucose: 97 mg/dL (ref 70–99)
Potassium: 4.6 mmol/L (ref 3.5–5.2)
Sodium: 138 mmol/L (ref 134–144)
Total Protein: 6 g/dL (ref 6.0–8.5)
eGFR: 62 mL/min/{1.73_m2} (ref >59)

## 2021-06-19 LAB — 25-HYDROXY VITAMIN D LCMS D2+D3
25-Hydroxy, Vitamin D-2: 1.8 ng/mL
25-Hydroxy, Vitamin D-3: 12 ng/mL
25-Hydroxy, Vitamin D: 14 ng/mL — ABNORMAL LOW

## 2021-06-19 LAB — SEDIMENTATION RATE: Sed Rate: 20 mm/hr (ref 0–30)

## 2021-06-19 LAB — C-REACTIVE PROTEIN: CRP: 1 mg/L (ref 0–10)

## 2021-06-19 LAB — MAGNESIUM: Magnesium: 2.3 mg/dL (ref 1.6–2.3)

## 2021-06-19 LAB — VITAMIN B12: Vitamin B-12: 142 pg/mL — ABNORMAL LOW (ref 232–1245)

## 2021-06-21 LAB — COMPLIANCE DRUG ANALYSIS, UR

## 2021-07-02 ENCOUNTER — Ambulatory Visit (INDEPENDENT_AMBULATORY_CARE_PROVIDER_SITE_OTHER): Payer: Medicaid Other

## 2021-07-02 NOTE — Progress Notes (Deleted)
I connected with  Liana Gerold on 07/02/21 by a audio enabled telemedicine application and verified that I am speaking with the correct person using two identifiers.  Patient Location: Home  Provider Location: Office   I discussed the limitations of evaluation and management by telemedicine. The patient expressed understanding and agreed to proceed.   Subjective:   Charles Ewing is a 81 y.o. male who presents for Medicare Annual/Subsequent preventive examination.  Review of Systems    Per HPI unless specifically indicated below        Objective:    There were no vitals filed for this visit. There is no height or weight on file to calculate BMI.  Wt Readings from Last 3 Encounters:  06/13/21 200 lb (90.7 kg)  02/15/21 195 lb 6.4 oz (88.6 kg)  12/22/20 220 lb (99.8 kg)   Temp Readings from Last 3 Encounters:  06/13/21 (!) 97.2 F (36.2 C)  12/27/20 97.8 F (36.6 C)  05/23/20 (!) 97.5 F (36.4 C)   BP Readings from Last 3 Encounters:  06/13/21 (!) 119/94  02/15/21 110/63  12/27/20 128/76   Pulse Readings from Last 3 Encounters:  06/13/21 74  02/15/21 78  12/27/20 87    Wt Readings from Last 3 Encounters:  06/13/21 200 lb (90.7 kg)  02/15/21 195 lb 6.4 oz (88.6 kg)  12/22/20 220 lb (99.8 kg)   Temp Readings from Last 3 Encounters:  06/13/21 (!) 97.2 F (36.2 C)  12/27/20 97.8 F (36.6 C)  05/23/20 (!) 97.5 F (36.4 C)   BP Readings from Last 3 Encounters:  06/13/21 (!) 119/94  02/15/21 110/63  12/27/20 128/76   Pulse Readings from Last 3 Encounters:  06/13/21 74  02/15/21 78  12/27/20 87        12/22/2020    6:00 PM 05/23/2020    7:38 AM 05/09/2020   12:17 PM 02/28/2015   12:06 PM  Advanced Directives  Does Patient Have a Medical Advance Directive? No No No No  Would patient like information on creating a medical advance directive? No - Patient declined No - Patient declined No - Patient declined     Current Medications (verified) Outpatient  Encounter Medications as of 07/02/2021  Medication Sig   albuterol (VENTOLIN HFA) 108 (90 Base) MCG/ACT inhaler INHALE 2 PUFFS INTO THE LUNGS EVERY 6 HOURS AS NEEDED FOR WHEEZING OR SHORTNESS OF BREATH.   amLODipine (NORVASC) 5 MG tablet Take 5 mg by mouth daily.   aspirin EC 81 MG tablet Take 81 mg by mouth every evening.   Elastic Bandages & Supports (MEDICAL COMPRESSION STOCKINGS) MISC 1 each by Does not apply route daily.   FLUoxetine (PROZAC) 20 MG capsule Take 1 capsule (20 mg total) by mouth 2 (two) times daily.   fluticasone (FLONASE) 50 MCG/ACT nasal spray PLACE 2 SPRAYS INTO BOTH NOSTRILS DAILY   furosemide (LASIX) 20 MG tablet TAKE 1/2 TO 1 TABLET BY MOUTH DAILY   gabapentin (NEURONTIN) 100 MG capsule Start 1 capsule daily, increase by 1 cap every 2-3 days as tolerated up to 3 times a day, or may take 3 at once in evening.   Lancets (ONETOUCH ULTRASOFT) lancets Use as instructed   LORazepam (ATIVAN) 0.5 MG tablet TAKE 1 TABLET BY MOUTH AT BEDTIME AS NEEDED FOR ANXIETY   losartan (COZAAR) 25 MG tablet TAKE 1 TABLET BY MOUTH DAILY   meloxicam (MOBIC) 15 MG tablet Take 1 tablet (15 mg total) by mouth daily as needed for pain.  metoprolol succinate (TOPROL-XL) 100 MG 24 hr tablet Take 100 mg by mouth daily. Take with or immediately following a meal.   mupirocin ointment (BACTROBAN) 2 % Apply 1 application topically 2 (two) times daily. For 1-2 weeks. Repeat as needed.   nystatin (MYCOSTATIN) 100000 UNIT/ML suspension TAKE 5 mls (1 TEASPOONFUL) BY MOUTH 4 TIMES DAILY.   oxyCODONE (OXY IR/ROXICODONE) 5 MG immediate release tablet Take 1 tablet (5 mg total) by mouth every 8 (eight) hours as needed for severe pain.   pantoprazole (PROTONIX) 20 MG tablet Take 20 mg by mouth daily.   pantoprazole (PROTONIX) 20 MG tablet Take 1 tablet by mouth daily.   potassium chloride (MICRO-K) 10 MEQ CR capsule    simvastatin (ZOCOR) 40 MG tablet Take 1 tablet (40 mg total) by mouth at bedtime.   sucralfate  (CARAFATE) 1 g tablet Take 1 tablet (1 g total) by mouth 4 (four) times daily -  with meals and at bedtime. As needed   tamsulosin (FLOMAX) 0.4 MG CAPS capsule TAKE 1 CAPSULE BY MOUTH DAILY AFTER BREAKFAST   No facility-administered encounter medications on file as of 07/02/2021.    Allergies (verified) Nexium [esomeprazole magnesium]   History: Past Medical History:  Diagnosis Date   Abdominal aortic aneurysm (AAA) ([place]) 2013   found at [facility]-   Anxiety    Arrhythmia    Arthritis    Colon polyp    COPD (chronic obstructive pulmonary disease) ([place])    Coronary artery disease    Depression    GERD (gastroesophageal reflux disease)    Hyperlipidemia    Hypertension    Skin cancer    Thrush    Past Surgical History:  Procedure Laterality Date   CATARACT EXTRACTION W/PHACO Left 05/09/2020   Procedure: CATARACT EXTRACTION PHACO AND INTRAOCULAR LENS PLACEMENT (IOC) LEFT DIABETIC 9.09 01:02.0;  Surgeon: Birder Robson, MD;  Location: [place];  Service: Ophthalmology;  Laterality: Left;   CATARACT EXTRACTION W/PHACO Right 05/23/2020   Procedure: CATARACT EXTRACTION PHACO AND INTRAOCULAR LENS PLACEMENT (IOC) RIGHT DIABETIC 8.13 01:00.3;  Surgeon: Birder Robson, MD;  Location: [place];  Service: Ophthalmology;  Laterality: Right;   Colon polyp surgery     SKIN CANCER EXCISION     TRANSURETHRAL RESECTION OF PROSTATE  2010   Family History  Problem Relation Age of Onset   Kidney cancer Mother    Prostate cancer Neg Hx    Bladder Cancer Neg Hx    Social History   Socioeconomic History   Marital status: Divorced    Spouse name: Not on file   Number of children: Not on file   Years of education: Not on file   Highest education level: Not on file  Occupational History   Not on file  Tobacco Use   Smoking status: Every Day    Packs/day: 1.00    Years: 70.00    Pack years: 70.00    Types: Cigarettes   Smokeless tobacco: Current  Substance and Sexual  Activity   Alcohol use: No   Drug use: No   Sexual activity: Not on file  Other Topics Concern   Not on file  Social History Narrative   Not on file   Social Determinants of Health   Financial Resource Strain: Not on file  Food Insecurity: Not on file  Transportation Needs: Not on file  Physical Activity: Not on file  Stress: Not on file  Social Connections: Not on file    Tobacco Counseling Ready  to quit: Not Answered Counseling given: Not Answered   Clinical Intake:                 Diabetic?***         Activities of Daily Living    12/22/2020    6:00 PM  In your present state of health, do you have any difficulty performing the following activities:  Hearing? 0  Vision? 0  Difficulty concentrating or making decisions? 0  Walking or climbing stairs? 0  Dressing or bathing? 0  Doing errands, shopping? 1    Patient Care Team: Olin Hauser, DO as PCP - General (Family Medicine) Saralyn Pilar, Sheppard Coil. MD         Cardiologist Birder Robson MD            Opthalmology  [place] [place]                           Podiatry [place], [place]                Pain Management   Indicate any recent Medical Services you may have received from other than [facility] providers in the past year (date may be approximate). The pt was admitted in the hospital on 12/22/2020-12/27/2020 for COPD with acute exacerbation.    Assessment:   This is a routine wellness examination for Margarito.  Hearing/Vision screen No results found.  Dietary issues and exercise activities discussed:     Goals Addressed   None    Depression Screen    06/13/2021   11:15 AM 02/15/2021    9:53 AM 11/06/2020    2:21 PM 05/25/2019    1:46 PM 08/26/2018    1:41 PM 04/09/2018   11:17 AM 04/09/2018   10:55 AM  PHQ 2/9 Scores  PHQ - 2 Score 2 2 0 0 0 0 0  PHQ- 9 Score 4 4         Fall Risk    02/15/2021    9:52 AM 08/26/2018    1:41 PM 04/09/2018   10:55 AM 05/26/2017   10:49 AM  02/19/2017    1:30 PM  Fall Risk   Falls in the past year? 1 0 0 No No  Number falls in past yr: 1  0    Injury with Fall? 0      Risk for fall due to : History of fall(s)      Follow up Falls evaluation completed Falls evaluation completed Falls evaluation completed      [place]:  Any stairs in or around the home? {YES/NO:21197} If so, are there any without handrails? {YES/NO:21197} Home free of loose throw rugs in walkways, pet beds, electrical cords, etc? {YES/NO:21197} Adequate lighting in your home to reduce risk of falls? {YES/NO:21197}  ASSISTIVE DEVICES UTILIZED TO PREVENT FALLS:  Life alert? {YES/NO:21197} Use of a cane, walker or w/c? {YES/NO:21197} Grab bars in the bathroom? {YES/NO:21197} Shower chair or bench in shower? {YES/NO:21197} Elevated toilet seat or a handicapped toilet? {YES/NO:21197}  TIMED UP AND GO:  Was the test performed? {YES/NO:21197}.  Length of time to ambulate 10 feet: *** sec.   {Appearance of DZHG:9924268}  Cognitive Function:        Immunizations Immunization History  Administered Date(s) Administered   Fluad Quad(high Dose 65+) 12/21/2018, 12/21/2019, 02/15/2021   Influenza, High Dose Seasonal PF 11/25/2016, 12/26/2017   Influenza-Unspecified 12/20/1996, 12/16/1997, 01/21/2001, 03/09/2003, 12/16/2003, 11/11/2005, 12/12/2016, 01/11/2018  PFIZER(Purple Top)SARS-COV-2 Vaccination 03/19/2019, 04/09/2019, 11/17/2019   Pneumococcal Conjugate-13 04/19/2013   Pneumococcal Polysaccharide-23 12/20/1996, 07/11/2006   Td 05/23/1997   Tdap 07/12/2008    TDAP status: Up to date  Flu Vaccine status: Up to date  Pneumococcal vaccine status: Up to date  Covid-19 vaccine status: Completed vaccines  Qualifies for Shingles Vaccine? Yes   Zostavax completed No   Shingrix Completed?: No.    Education has been provided regarding the importance of this vaccine. Patient has been advised to call insurance  company to determine out of pocket expense if they have not yet received this vaccine. Advised may also receive vaccine at local pharmacy or Health Dept. Verbalized acceptance and understanding.  Screening Tests Health Maintenance  Topic Date Due   Zoster Vaccines- Shingrix (1 of 2) Never done   COVID-19 Vaccine (4 - Booster for Pfizer series) 01/12/2020   INFLUENZA VACCINE  09/11/2021   TETANUS/TDAP  02/11/2022   Pneumonia Vaccine 69+ Years old  Completed   HPV VACCINES  Aged Out    Health Maintenance  Health Maintenance Due  Topic Date Due   Zoster Vaccines- Shingrix (1 of 2) Never done   COVID-19 Vaccine (4 - Booster for Pfizer series) 01/12/2020    Colonoscopy: 4/5, no longer required   Lung Cancer Screening: (Low Dose CT Chest recommended if Age 3-80 years, 30 pack-year currently smoking OR have quit w/in 15years.) doesn't qualify   Lung Cancer Screening Referral: doesn't qualify   Additional Screening:  Hepatitis C Screening: does not qualify; Completed   Vision Screening: Recommended annual ophthalmology exams for early detection of glaucoma and other disorders of the eye. Is the patient up to date with their annual eye exam?  Yes  Who is the provider or what is the name of the office in which the patient attends annual eye exams? Birder Robson, MD If pt is not established with a provider, would they like to be referred to a provider to establish care? No .   Dental Screening: Recommended annual dental exams for proper oral hygiene  Community Resource Referral / Chronic Care Management: CRR required this visit?  {YES/NO:21197}  CCM required this visit?  {YES/NO:21197}     Plan:     I have personally reviewed and noted the following in the patient's chart:   Medical and social history Use of alcohol, tobacco or illicit drugs  Current medications and supplements including opioid prescriptions. Patient is currently taking opioid prescriptions. Information  provided to patient regarding non-opioid alternatives. Patient advised to discuss non-opioid treatment plan with their provider. Functional ability and status Nutritional status Physical activity Advanced directives List of other physicians Hospitalizations, surgeries, and ER visits in previous 12 months Vitals Screenings to include cognitive, depression, and falls Referrals and appointments  In addition, I have reviewed and discussed with patient certain preventive protocols, quality metrics, and best practice recommendations. A written personalized care plan for preventive services as well as general preventive health recommendations were provided to patient.     Wilson Singer, [place]   07/02/2021   Nurse Notes: ***

## 2021-07-02 NOTE — Patient Instructions (Signed)
Health Maintenance, Male Adopting a healthy lifestyle and getting preventive care are important in promoting health and wellness. Ask your health care provider about: The right schedule for you to have regular tests and exams. Things you can do on your own to prevent diseases and keep yourself healthy. What should I know about diet, weight, and exercise? Eat a healthy diet  Eat a diet that includes plenty of vegetables, fruits, low-fat dairy products, and lean protein. Do not eat a lot of foods that are high in solid fats, added sugars, or sodium. Maintain a healthy weight Body mass index (BMI) is a measurement that can be used to identify possible weight problems. It estimates body fat based on height and weight. Your health care provider can help determine your BMI and help you achieve or maintain a healthy weight. Get regular exercise Get regular exercise. This is one of the most important things you can do for your health. Most adults should: Exercise for at least 150 minutes each week. The exercise should increase your heart rate and make you sweat (moderate-intensity exercise). Do strengthening exercises at least twice a week. This is in addition to the moderate-intensity exercise. Spend less time sitting. Even light physical activity can be beneficial. Watch cholesterol and blood lipids Have your blood tested for lipids and cholesterol at 82 years of age, then have this test every 5 years. You may need to have your cholesterol levels checked more often if: Your lipid or cholesterol levels are high. You are older than 82 years of age. You are at high risk for heart disease. What should I know about cancer screening? Many types of cancers can be detected early and may often be prevented. Depending on your health history and family history, you may need to have cancer screening at various ages. This may include screening for: Colorectal cancer. Prostate cancer. Skin cancer. Lung  cancer. What should I know about heart disease, diabetes, and high blood pressure? Blood pressure and heart disease High blood pressure causes heart disease and increases the risk of stroke. This is more likely to develop in people who have high blood pressure readings or are overweight. Talk with your health care provider about your target blood pressure readings. Have your blood pressure checked: Every 3-5 years if you are 18-39 years of age. Every year if you are 40 years old or older. If you are between the ages of 65 and 75 and are a current or former smoker, ask your health care provider if you should have a one-time screening for abdominal aortic aneurysm (AAA). Diabetes Have regular diabetes screenings. This checks your fasting blood sugar level. Have the screening done: Once every three years after age 45 if you are at a normal weight and have a low risk for diabetes. More often and at a younger age if you are overweight or have a high risk for diabetes. What should I know about preventing infection? Hepatitis B If you have a higher risk for hepatitis B, you should be screened for this virus. Talk with your health care provider to find out if you are at risk for hepatitis B infection. Hepatitis C Blood testing is recommended for: Everyone born from 1945 through 1965. Anyone with known risk factors for hepatitis C. Sexually transmitted infections (STIs) You should be screened each year for STIs, including gonorrhea and chlamydia, if: You are sexually active and are younger than 82 years of age. You are older than 82 years of age and your   health care provider tells you that you are at risk for this type of infection. Your sexual activity has changed since you were last screened, and you are at increased risk for chlamydia or gonorrhea. Ask your health care provider if you are at risk. Ask your health care provider about whether you are at high risk for HIV. Your health care provider  may recommend a prescription medicine to help prevent HIV infection. If you choose to take medicine to prevent HIV, you should first get tested for HIV. You should then be tested every 3 months for as long as you are taking the medicine. Follow these instructions at home: Alcohol use Do not drink alcohol if your health care provider tells you not to drink. If you drink alcohol: Limit how much you have to 0-2 drinks a day. Know how much alcohol is in your drink. In the [place] one drink equals one 12 oz bottle of beer (355 mL), one 5 oz glass of wine (148 mL), or one 1 oz glass of hard liquor (44 mL). Lifestyle Do not use any products that contain nicotine or tobacco. These products include cigarettes, chewing tobacco, and vaping devices, such as e-cigarettes. If you need help quitting, ask your health care provider. Do not use street drugs. Do not share needles. Ask your health care provider for help if you need support or information about quitting drugs. General instructions Schedule regular health, dental, and eye exams. Stay current with your vaccines. Tell your health care provider if: You often feel depressed. You have ever been abused or do not feel safe at home. Summary Adopting a healthy lifestyle and getting preventive care are important in promoting health and wellness. Follow your health care provider's instructions about healthy diet, exercising, and getting tested or screened for diseases. Follow your health care provider's instructions on monitoring your cholesterol and blood pressure. This information is not intended to replace advice given to you by your health care provider. Make sure you discuss any questions you have with your health care provider. Document Revised: 06/19/2020 Document Reviewed: 06/19/2020 Elsevier Patient Education  2023 Elsevier Inc.  

## 2021-07-03 ENCOUNTER — Other Ambulatory Visit: Payer: Self-pay | Admitting: Family Medicine

## 2021-07-03 DIAGNOSIS — G8929 Other chronic pain: Secondary | ICD-10-CM

## 2021-07-03 DIAGNOSIS — M5136 Other intervertebral disc degeneration, lumbar region: Secondary | ICD-10-CM

## 2021-07-04 NOTE — Telephone Encounter (Signed)
Requested medication (s) are due for refill today: yes  Requested medication (s) are on the active medication list: yes  Last refill:  05/29/21 #60 0 refills  Future visit scheduled: no Maximum MME cannot be calculated for this prescription. Enter discrete sig details to calculate maximum MME. Notes to clinic:  not delegated per protocol.  Called patient to schedule appt no answer, unable to leave message voicemail box not set up .     Requested Prescriptions  Pending Prescriptions Disp Refills   oxyCODONE (OXY IR/ROXICODONE) 5 MG immediate release tablet [Pharmacy Med Name: OXYCODONE HCL 5 MG TAB] 60 tablet     Sig: TAKE 1 TABLET BY MOUTH EVERY 8 HOURS AS NEEDED FOR SEVERE PAIN     Not Delegated - Analgesics:  Opioid Agonists Failed - 07/03/2021 10:56 AM      Failed - This refill cannot be delegated      Failed - Urine Drug Screen completed in last 360 days      Failed - Valid encounter within last 3 months    Recent Outpatient Visits           4 months ago Chronic right-sided low back pain with bilateral sciatica   [place], DO   6 months ago Acute intractable headache, unspecified headache type   [facility] [place], Coralie Keens, NP   8 months ago Annual physical exam   [facility] Olin Hauser, DO   10 months ago Bilateral lower extremity edema   [place], DO   1 year ago Intercostal muscle strain, initial encounter   [place], Devonne Doughty, [place]

## 2021-07-04 NOTE — Telephone Encounter (Signed)
Called patient to schedule appt for medication refills. No answer, voicemail box has not been set up. Unable to leave message.

## 2021-07-16 ENCOUNTER — Other Ambulatory Visit: Payer: Self-pay | Admitting: Family Medicine

## 2021-07-16 DIAGNOSIS — R350 Frequency of micturition: Secondary | ICD-10-CM

## 2021-07-16 DIAGNOSIS — R3911 Hesitancy of micturition: Secondary | ICD-10-CM

## 2021-07-16 DIAGNOSIS — N138 Other obstructive and reflux uropathy: Secondary | ICD-10-CM

## 2021-07-17 NOTE — Telephone Encounter (Signed)
Requested Prescriptions  Pending Prescriptions Disp Refills  . tamsulosin (FLOMAX) 0.4 MG CAPS capsule [Pharmacy Med Name: TAMSULOSIN HCL 0.4 MG CAP] 90 capsule 0    Sig: TAKE 1 CAPSULE BY MOUTH DAILY AFTER BREAKFAST     Urology: Alpha-Adrenergic Blocker Failed - 07/16/2021 11:38 AM      Failed - PSA in normal range and within 360 days    PSA  Date Value Ref Range Status  10/30/2020 8.29 (H) < OR = 4.00 ng/mL Final    Comment:    The total PSA value from this assay system is  standardized against the WHO standard. The test  result will be approximately 20% lower when compared  to the equimolar-standardized total PSA (Beckman  Coulter). Comparison of serial PSA results should be  interpreted with this fact in mind. . This test was performed using the Siemens  chemiluminescent method. Values obtained from  different assay methods cannot be used interchangeably. PSA levels, regardless of value, should not be interpreted as absolute evidence of the presence or absence of disease.    Prostate Specific Ag, Serum  Date Value Ref Range Status  12/18/2015 5.6 (H) 0.0 - 4.0 ng/mL Final    Comment:    Roche ECLIA methodology. According to the American Urological Association, Serum PSA should decrease and remain at undetectable levels after radical prostatectomy. The AUA defines biochemical recurrence as an initial PSA value 0.2 ng/mL or greater followed by a subsequent confirmatory PSA value 0.2 ng/mL or greater. Values obtained with different assay methods or kits cannot be used interchangeably. Results cannot be interpreted as absolute evidence of the presence or absence of malignant disease.          Failed - Last BP in normal range    BP Readings from Last 1 Encounters:  06/13/21 (!) 119/94         Passed - Valid encounter within last 12 months    Recent Outpatient Visits          5 months ago Chronic right-sided low back pain with bilateral sciatica   [place], DO   7 months ago Acute intractable headache, unspecified headache type   [facility] [place], Coralie Keens, NP   8 months ago Annual physical exam   [facility] Olin Hauser, DO   10 months ago Bilateral lower extremity edema   [place], DO   1 year ago Intercostal muscle strain, initial encounter   [place], Devonne Doughty, [place]

## 2021-07-22 NOTE — Progress Notes (Unsigned)
PROVIDER NOTE: Information contained herein reflects review and annotations entered in association with encounter. Interpretation of such information and data should be left to medically-trained personnel. Information provided to patient can be located elsewhere in the medical record under "Patient Instructions". Document created using STT-dictation technology, any transcriptional errors that may result from process are unintentional.    Patient: Charles Ewing  Service Category: E/M  Provider: Gaspar Cola, MD  DOB: November 27, 1940  DOS: 07/23/2021  Specialty: Interventional Pain Management  MRN: [id number]  Setting: Ambulatory outpatient  PCP: Olin Hauser, DO  Type: Established Patient    Referring Provider: [name] *  Location: Office  Delivery: Face-to-face     Primary Reason(s) for Visit: Encounter for evaluation before starting new chronic pain management plan of care (Level of risk: moderate) CC: No chief complaint on file.  HPI  Charles Ewing is a 81 y.o. year old, male patient, who comes today for a follow-up evaluation to review the test results and decide on a treatment plan. He has Orthostatic hypotension; Elevated prostate specific antigen (PSA); Benign prostatic hyperplasia with urinary obstruction; False passage of urethra; Prediabetes; Hyperlipidemia; Centrilobular emphysema ([place]); Abdominal aortic aneurysm (AAA) ([place]); Benign hypertension with CKD (chronic kidney disease) stage III ([place]); Left sided chest pain; Coronary artery disease; Osteoarthritis of knees (Bilateral); Slow transit constipation; Anxiety; Insomnia; Irregular heart rhythm; SOB (shortness of breath) on exertion; Venous insufficiency of both lower extremities; Bilateral lower extremity edema; PTSD (post-traumatic stress disorder); COPD with acute exacerbation ([place]); Depression; Hypertension; COVID-19 virus infection; Nicotine dependence; Cardiomyopathy ([place]); Seborrheic keratosis; Actinic  keratosis; 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[place]); Basal cell carcinoma of nose; Disorder of eustachian tube; Esophageal reflux; History of colonic polyps; History of malignant neoplasm of skin; Hypertensive heart disease without congestive heart failure; Neoplasm of uncertain behavior of skin; Other ill-defined and unknown causes of morbidity and mortality; Other specified counseling; Reason for consultation; Tobacco use disorder; Verruca vulgaris; COPD (chronic obstructive pulmonary disease) ([place]); Atypical chest pain; Panic attack; Panic disorder; Depressive disorder; Chronic pain syndrome; Pharmacologic therapy; Disorder of skeletal system; Problems influencing health status; Long term prescription benzodiazepine use; Chronic use of opiate for therapeutic purpose; Long term current use of non-steroidal anti-inflammatories (NSAID); Chronic hip pain (2ry area of Pain) (Right); Chronic lower extremity pain (3ry area of Pain) (Right); Chronic cardiopulmonary disease ([place]); Chronic low back pain (1ry area of Pain) (Bilateral) (R>L) w/o sciatica; and Lumbar facet syndrome (Right) on their problem list. His primarily concern today is the No chief complaint on file.  Pain Assessment: Location:     Radiating:   Onset:   Duration:   Quality:   Severity:  /10 (subjective, self-reported pain score)  Effect on ADL:   Timing:   Modifying factors:   BP:    HR:    Charles Ewing comes in today for a follow-up visit after his initial evaluation on 06/13/2021. Today we went over the results of his tests. These were explained in "Layman's terms". During today's appointment we went over my diagnostic impression, as well as the proposed treatment plan.  ***  In considering the treatment plan options, Charles Ewing was reminded that I no longer take patients for medication management only. I asked him to let me know if he had no intention of taking advantage of the interventional therapies, so that we could make arrangements to  provide this space to someone interested. I also made it clear that undergoing interventional therapies for the purpose of getting pain medications is  very inappropriate on the part of a patient, and it will not be tolerated in this practice. This type of behavior would suggest true addiction and therefore it requires referral to an addiction specialist.   Further details on both, my assessment(s), as well as the proposed treatment plan, please see below.  Controlled Substance Pharmacotherapy Assessment REMS (Risk Evaluation and Mitigation Strategy)  Opioid Analgesic: Oxycodone IR 5 mg tablet, 1 tab p.o. 3 times daily (# 60) (last filled on 05/30/2021) MME/day: 22.5 mg/day  Pill Count: None expected due to no prior prescriptions written by our practice. No notes on file Pharmacokinetics: Liberation and absorption (onset of action): WNL Distribution (time to peak effect): WNL Metabolism and excretion (duration of action): WNL         Pharmacodynamics: Desired effects: Analgesia: Charles Ewing reports >50% benefit. Functional ability: Patient reports that medication allows him to accomplish basic ADLs Clinically meaningful improvement in function (CMIF): Sustained CMIF goals met Perceived effectiveness: Described as relatively effective, allowing for increase in activities of daily living (ADL) Undesirable effects: Side-effects or Adverse reactions: None reported Monitoring: [place] PMP: PDMP reviewed during this encounter. Online review of the past 64-monthperiod previously conducted. Not applicable at this point since we have not taken over the patient's medication management yet. List of other Serum/Urine Drug Screening Test(s):  No results found for: "AMPHSCRSER", "BARBSCRSER", "BENZOSCRSER", "COCAINSCRSER", "COCAINSCRNUR", "PCPSCRSER", "THCSCRSER", "THCU", "CANNABQUANT", "OPIATESCRSER", "OXYSCRSER", "PROPOXSCRSER", "ETH" List of all UDS test(s) done:  Lab Results  Component Value Date    SUMMARY Note 06/14/2021   Last UDS on record: Summary  Date Value Ref Range Status  06/14/2021 Note  Final    Comment:    ==================================================================== Compliance Drug Analysis, Ur ==================================================================== Test                             Result       Flag       Units  Drug Present and Declared for Prescription Verification   Lorazepam                      200          EXPECTED   ng/mg creat    Source of lorazepam is a scheduled prescription medication.    Oxycodone                      1671         EXPECTED   ng/mg creat   Noroxycodone                   2522         EXPECTED   ng/mg creat    Sources of oxycodone include scheduled prescription medications.    Noroxycodone is an expected metabolite of oxycodone.    Gabapentin                     PRESENT      EXPECTED   Fluoxetine                     PRESENT      EXPECTED   Norfluoxetine                  PRESENT      EXPECTED    Norfluoxetine is an expected metabolite of fluoxetine.  Drug Absent but Declared for Prescription Verification   Salicylate  Not Detected UNEXPECTED    Aspirin, as indicated in the declared medication list, is not always    detected even when used as directed.    Metoprolol                     Not Detected UNEXPECTED ==================================================================== Test                      Result    Flag   Units      Ref Range   Creatinine              41               mg/dL      >=20 ==================================================================== Declared Medications:  The flagging and interpretation on this report are based on the  following declared medications.  Unexpected results may arise from  inaccuracies in the declared medications.   **Note: The testing scope of this panel includes these medications:   Fluoxetine (Prozac)  Gabapentin (Neurontin)  Lorazepam  (Ativan)  Metoprolol (Toprol)  Oxycodone (Roxicodone)   **Note: The testing scope of this panel does not include small to  moderate amounts of these reported medications:   Aspirin   **Note: The testing scope of this panel does not include the  following reported medications:   Albuterol (Ventolin HFA)  Amlodipine (Norvasc)  Fluticasone (Flonase)  Furosemide (Lasix)  Losartan (Cozaar)  Meloxicam (Mobic)  Mupirocin (Bactroban)  Nystatin  Pantoprazole (Protonix)  Potassium Chloride  Simvastatin (Zocor)  Sucralfate (Carafate)  Tamsulosin (Flomax) ==================================================================== For clinical consultation, please call [phone]. ====================================================================    UDS interpretation: No unexpected findings.          Medication Assessment Form: Not applicable. No opioids. Treatment compliance: Not applicable Risk Assessment Profile: Aberrant behavior: See initial evaluations. None observed or detected today Comorbid factors increasing risk of overdose: See initial evaluation. No additional risks detected today Opioid risk tool (ORT):     06/13/2021   11:15 AM  Opioid Risk   Alcohol 3  Illegal Drugs 0  Rx Drugs 0  Psychological Disease 0  Depression 0  Opioid Risk Tool Scoring 3  Opioid Risk Interpretation Low Risk    ORT Scoring interpretation table:  Score <3 = Low Risk for SUD  Score between 4-7 = Moderate Risk for SUD  Score >8 = High Risk for Opioid Abuse   Risk of substance use disorder (SUD): Low  Risk Mitigation Strategies:  Patient opioid safety counseling: No controlled substances prescribed. Patient-Prescriber Agreement (PPA): No agreement signed.  Controlled substance notification to other providers: None required. No opioid therapy.  Pharmacologic Plan: Non-opioid analgesic therapy offered. Interventional alternatives discussed.             Laboratory Chemistry Profile    Renal Lab Results  Component Value Date   BUN 18 06/13/2021   CREATININE 1.18 06/13/2021   BCR 15 06/13/2021   GFRAA 64 01/11/2019   GFRNONAA >60 12/23/2020   SPECGRAV 1.020 10/07/2019   PHUR 5.0 10/07/2019   PROTEINUR Positive (A) 10/07/2019     Electrolytes Lab Results  Component Value Date   NA 138 06/13/2021   K 4.6 06/13/2021   CL 102 06/13/2021   CALCIUM 8.8 06/13/2021   MG 2.3 06/13/2021     Hepatic Lab Results  Component Value Date   AST 13 06/13/2021   ALT 11 10/30/2020   ALBUMIN 3.9 06/13/2021   ALKPHOS 70 06/13/2021  ID Lab Results  Component Value Date   SARSCOV2NAA POSITIVE (A) 12/22/2020     Bone Lab Results  Component Value Date   25OHVITD1 14 (L) 06/13/2021   25OHVITD2 1.8 06/13/2021   25OHVITD3 12 06/13/2021     Endocrine Lab Results  Component Value Date   GLUCOSE 97 06/13/2021   GLUCOSEU Negative 09/25/2017   HGBA1C 5.8 (H) 10/30/2020     Neuropathy Lab Results  Component Value Date   VITAMINB12 142 (L) 06/13/2021   HGBA1C 5.8 (H) 10/30/2020     CNS No results found for: "COLORCSF", "APPEARCSF", "RBCCOUNTCSF", "WBCCSF", "POLYSCSF", "LYMPHSCSF", "EOSCSF", "PROTEINCSF", "GLUCCSF", "JCVIRUS", "CSFOLI", "IGGCSF", "LABACHR", "ACETBL"   Inflammation (CRP: Acute  ESR: Chronic) Lab Results  Component Value Date   CRP 1 06/13/2021   ESRSEDRATE 20 06/13/2021     Rheumatology No results found for: "RF", "ANA", "LABURIC", "URICUR", "LYMEIGGIGMAB", "LYMEABIGMQN", "HLAB27"   Coagulation Lab Results  Component Value Date   PLT 190 12/23/2020   DDIMER 0.85 (H) 12/27/2020     Cardiovascular Lab Results  Component Value Date   BNP 43.5 12/26/2020   CKTOTAL 64 10/15/2013   CKMB 1.8 10/16/2013   TROPONINI <0.03 02/28/2015   HGB 13.5 12/23/2020   HCT 40.6 12/23/2020     Screening Lab Results  Component Value Date   SARSCOV2NAA POSITIVE (A) 12/22/2020     Cancer No results found for: "CEA", "CA125", "LABCA2"    Allergens No results found for: "ALMOND", "APPLE", "ASPARAGUS", "AVOCADO", "BANANA", "BARLEY", "BASIL", "BAYLEAF", "GREENBEAN", "LIMABEAN", "WHITEBEAN", "BEEFIGE", "REDBEET", "BLUEBERRY", "BROCCOLI", "CABBAGE", "MELON", "CARROT", "CASEIN", "CASHEWNUT", "CAULIFLOWER", "CELERY"     Note: Lab results reviewed.  Recent Diagnostic Imaging Review  Cervical Imaging: Cervical MR wo contrast: No results found for this or any previous visit.  Cervical MR wo contrast: No valid procedures specified. Cervical MR w/wo contrast: No results found for this or any previous visit.  Cervical CT wo contrast: No results found for this or any previous visit.  Cervical CT outside: No results found for this or any previous visit.  Cervical DG F/E views: No results found for this or any previous visit.  Cervical DG Bending/F/E views: No results found for this or any previous visit.   Shoulder Imaging: Shoulder-R MR w/wo contrast: No results found for this or any previous visit.  Shoulder-L MR w/wo contrast: No results found for this or any previous visit.  Shoulder-R MR wo contrast: No results found for this or any previous visit.  Shoulder-L MR wo contrast: No results found for this or any previous visit.  Shoulder-R DG: No results found for this or any previous visit.  Shoulder-L DG: No results found for this or any previous visit.   Thoracic Imaging: Thoracic MR wo contrast: No results found for this or any previous visit.  Thoracic MR wo contrast: No valid procedures specified. Thoracic MR w/wo contrast: No results found for this or any previous visit.  Thoracic CT wo contrast: No results found for this or any previous visit.  Thoracic CT w/wo contrast: No results found for this or any previous visit.  Thoracic CT w/wo contrast: No results found for this or any previous visit.  Thoracic DG 4 views: No results found for this or any previous visit.  Thoracic DG: No results found for this  or any previous visit.  Thoracic DG w/swimmers view: No results found for this or any previous visit.   Lumbosacral Imaging: Lumbar MR wo contrast: No results found for this or any previous  visit.  Lumbar MR wo contrast: No valid procedures specified. Lumbar MR w/wo contrast: No results found for this or any previous visit.  Lumbar CT wo contrast: No results found for this or any previous visit.  Lumbar CT w/wo contrast: No results found for this or any previous visit.  Lumbar CT w/wo contrast: No results found for this or any previous visit.  Lumbar DG (Complete) 4+V: No results found for this or any previous visit.        Lumbar DG F/E views: No results found for this or any previous visit.        Lumbar DG Bending views: Results for orders placed during the hospital encounter of 06/13/21  DG Lumbar Spine Complete W/Bend  Narrative CLINICAL DATA:  Generalized lower back pain that extends down posterior side of right hip for 1 year.  EXAM: LUMBAR SPINE - COMPLETE WITH BENDING VIEWS  COMPARISON:  CT 09/17/2016.  FINDINGS: Lumbar spine numbered the lowest segmented appearing lumbar shaped vertebral lateral view as L5. Lumbar scoliosis concave right. Multilevel disc degeneration and endplate osteophyte formation noted. Disc degeneration most prominent L4-L5. Diffuse facet hypertrophy noted. No acute bony abnormality identified. No evidence of flexion or extension abnormality. Surgical clip right upper quadrant. Large amount of stool noted throughout the colon. Aortoiliac atherosclerotic vascular calcification. Aneurysmal dilatation of the abdominal aorta cannot be excluded. Ultrasound of the abdominal aorta is suggested.  IMPRESSION: 1. Lumbar spine scoliosis concave right. Diffuse multilevel degenerative change. No flexion or extension abnormality. No acute bony abnormality identified.  2.  Large amount of stool noted throughout the colon.  3. Aortoiliac  atherosclerotic vascular disease. Abdominal aortic aneurysm cannot be excluded. Abdominal aortic ultrasound suggested for further evaluation.   Electronically Signed By: Marcello Moores  Register M.D. On: 06/14/2021 06:11        Lumbar DG Myelogram views: No results found for this or any previous visit.  Lumbar DG Myelogram: No results found for this or any previous visit.  Lumbar DG Myelogram: No results found for this or any previous visit.  Lumbar DG Myelogram: No results found for this or any previous visit.  Lumbar DG Myelogram Lumbosacral: No results found for this or any previous visit.   Sacroiliac Joint Imaging: Sacroiliac Joint DG: No results found for this or any previous visit.  Sacroiliac Joint MR w/wo contrast: No results found for this or any previous visit.  Sacroiliac Joint MR wo contrast: No results found for this or any previous visit.   Spine Imaging: MRA Spinal Canal w/ cm: No results found for this or any previous visit.  MRA Spinal Canal wo/ cm: No valid procedures specified. MRA Spinal Canal w/wo cm: No results found for this or any previous visit.   Hip Imaging: Hip-R MR w/wo contrast: No results found for this or any previous visit.  Hip-L MR w/wo contrast: No results found for this or any previous visit.  Hip-R MR wo contrast: No results found for this or any previous visit.  Hip-L MR wo contrast: No results found for this or any previous visit.  Hip-R CT w/wo contrast: No results found for this or any previous visit.  Hip-L CT w/wo contrast: No results found for this or any previous visit.  Hip-R CT wo contrast: No results found for this or any previous visit.  Hip-L CT wo contrast: No results found for this or any previous visit.  Hip-R DG 2-3 views: Results for orders placed during the hospital encounter of 06/13/21  DG HIP UNILAT W OR W/O PELVIS 2-3 VIEWS RIGHT  Narrative CLINICAL DATA:  Generalized lower back pain that extends  down posterior side of right hip for 1 year.  EXAM: DG HIP (WITH OR WITHOUT PELVIS) 2-3V RIGHT  COMPARISON:  CT 09/17/2016.  FINDINGS: Diffuse osteopenia. Degenerative changes lumbar spine, both SI joints, both hips. No acute bony or joint abnormality identified. Pelvic and perineal calcifications consistent phlebolith prostate calcification noted. Peripheral vascular calcification.  IMPRESSION: 1. Diffuse osteopenia. Diffuse degenerative change. No acute bony abnormality.  2.  Peripheral vascular disease.   Electronically Signed By: Marcello Moores  Register M.D. On: 06/14/2021 06:30  Hip-L DG 2-3 views: No results found for this or any previous visit.  Hip-B DG Bilateral: No results found for this or any previous visit.   Knee Imaging: Knee-R MR w/wo contrast: No results found for this or any previous visit.  Knee-L MR w/wo contrast: No results found for this or any previous visit.  Knee-R MR wo contrast: No results found for this or any previous visit.  Knee-L MR wo contrast: No results found for this or any previous visit.  Knee-R CT w/wo contrast: No results found for this or any previous visit.  Knee-L CT w/wo contrast: No results found for this or any previous visit.  Knee-R CT wo contrast: No results found for this or any previous visit.  Knee-L CT wo contrast: No results found for this or any previous visit.  Knee-R DG 4 views: No results found for this or any previous visit.  Knee-L DG 4 views: No results found for this or any previous visit.   Ankle Imaging: Ankle-R DG Complete: No results found for this or any previous visit.  Ankle-L DG Complete: No results found for this or any previous visit.   Foot Imaging: Foot-R DG Complete: No results found for this or any previous visit.  Foot-L DG Complete: No results found for this or any previous visit.   Elbow Imaging: Elbow-R DG Complete: No results found for this or any previous visit.  Elbow-L DG  Complete: No results found for this or any previous visit.   Wrist Imaging: Wrist-R DG Complete: No results found for this or any previous visit.  Wrist-L DG Complete: No results found for this or any previous visit.   Hand Imaging: Hand-R DG Complete: No results found for this or any previous visit.  Hand-L DG Complete: No results found for this or any previous visit.   Complexity Note: Imaging results reviewed. Results shared with Charles Ewing, using Layman's terms.                         Meds   Current Outpatient Medications:    albuterol (VENTOLIN HFA) 108 (90 Base) MCG/ACT inhaler, INHALE 2 PUFFS INTO THE LUNGS EVERY 6 HOURS AS NEEDED FOR WHEEZING OR SHORTNESS OF BREATH., Disp: 8.5 g, Rfl: 3   amLODipine (NORVASC) 5 MG tablet, Take 5 mg by mouth daily., Disp: , Rfl:    aspirin EC 81 MG tablet, Take 81 mg by mouth every evening., Disp: , Rfl:    Elastic Bandages & Supports ([place]) MISC, 1 each by Does not apply route daily., Disp: 1 each, Rfl: 0   FLUoxetine (PROZAC) 20 MG capsule, Take 1 capsule (20 mg total) by mouth 2 (two) times daily., Disp: 180 capsule, Rfl: 3   fluticasone (FLONASE) 50 MCG/ACT nasal spray, PLACE 2 SPRAYS INTO BOTH NOSTRILS DAILY, Disp:  16 g, Rfl: 3   furosemide (LASIX) 20 MG tablet, TAKE 1/2 TO 1 TABLET BY MOUTH DAILY, Disp: 30 tablet, Rfl: 2   gabapentin (NEURONTIN) 100 MG capsule, Start 1 capsule daily, increase by 1 cap every 2-3 days as tolerated up to 3 times a day, or may take 3 at once in evening., Disp: 90 capsule, Rfl: 1   Lancets (ONETOUCH ULTRASOFT) lancets, Use as instructed, Disp: 100 each, Rfl: 12   LORazepam (ATIVAN) 0.5 MG tablet, TAKE 1 TABLET BY MOUTH AT BEDTIME AS NEEDED FOR ANXIETY, Disp: 30 tablet, Rfl: 2   losartan (COZAAR) 25 MG tablet, TAKE 1 TABLET BY MOUTH DAILY, Disp: 90 tablet, Rfl: 1   meloxicam (MOBIC) 15 MG tablet, Take 1 tablet (15 mg total) by mouth daily as needed for pain., Disp: 30 tablet, Rfl: 2    metoprolol succinate (TOPROL-XL) 100 MG 24 hr tablet, Take 100 mg by mouth daily. Take with or immediately following a meal., Disp: , Rfl:    mupirocin ointment (BACTROBAN) 2 %, Apply 1 application topically 2 (two) times daily. For 1-2 weeks. Repeat as needed., Disp: 22 g, Rfl: 0   nystatin (MYCOSTATIN) 100000 UNIT/ML suspension, TAKE 5 mls (1 TEASPOONFUL) BY MOUTH 4 TIMES DAILY., Disp: 473 mL, Rfl: 3   oxyCODONE (OXY IR/ROXICODONE) 5 MG immediate release tablet, Take 1 tablet (5 mg total) by mouth every 8 (eight) hours as needed for severe pain., Disp: 60 tablet, Rfl: 0   pantoprazole (PROTONIX) 20 MG tablet, Take 20 mg by mouth daily., Disp: , Rfl:    pantoprazole (PROTONIX) 20 MG tablet, Take 1 tablet by mouth daily., Disp: , Rfl:    potassium chloride (MICRO-K) 10 MEQ CR capsule, , Disp: , Rfl:    simvastatin (ZOCOR) 40 MG tablet, Take 1 tablet (40 mg total) by mouth at bedtime., Disp: 90 tablet, Rfl: 3   sucralfate (CARAFATE) 1 g tablet, Take 1 tablet (1 g total) by mouth 4 (four) times daily -  with meals and at bedtime. As needed, Disp: 40 tablet, Rfl: 5   tamsulosin (FLOMAX) 0.4 MG CAPS capsule, TAKE 1 CAPSULE BY MOUTH DAILY AFTER BREAKFAST, Disp: 90 capsule, Rfl: 0  ROS  Constitutional: Denies any fever or chills Gastrointestinal: No reported hemesis, hematochezia, vomiting, or acute GI distress Musculoskeletal: Denies any acute onset joint swelling, redness, loss of ROM, or weakness Neurological: No reported episodes of acute onset apraxia, aphasia, dysarthria, agnosia, amnesia, paralysis, loss of coordination, or loss of consciousness  Allergies  Charles Ewing is allergic to nexium [esomeprazole magnesium].  [place]  Drug: Charles Ewing  reports no history of drug use. Alcohol:  reports no history of alcohol use. Tobacco:  reports that he has been smoking cigarettes. He has a 70.00 pack-year smoking history. He uses smokeless tobacco. Medical:  has a past medical history of Abdominal aortic  aneurysm (AAA) ([place]) (2013), Anxiety, Arrhythmia, Arthritis, Colon polyp, COPD (chronic obstructive pulmonary disease) ([place]), Coronary artery disease, Depression, GERD (gastroesophageal reflux disease), Hyperlipidemia, Hypertension, Skin cancer, and Thrush. Surgical: Charles Ewing  has a past surgical history that includes Skin cancer excision; Colon polyp surgery; Transurethral resection of prostate (2010); Cataract extraction w/PHACO (Left, 05/09/2020); and Cataract extraction w/PHACO (Right, 05/23/2020). Family: family history includes Kidney cancer in his mother.  Constitutional Exam  General appearance: Well nourished, well developed, and well hydrated. In no apparent acute distress There were no vitals filed for this visit. BMI Assessment: Estimated body mass index is 28.29 kg/m as calculated from the  following:   Height as of 06/13/21: 5' 10.5" (1.791 m).   Weight as of 06/13/21: 200 lb (90.7 kg).  BMI interpretation table: BMI level Category Range association with higher incidence of chronic pain  <18 kg/m2 Underweight   18.5-24.9 kg/m2 Ideal body weight   25-29.9 kg/m2 Overweight Increased incidence by 20%  30-34.9 kg/m2 Obese (Class I) Increased incidence by 68%  35-39.9 kg/m2 Severe obesity (Class II) Increased incidence by 136%  >40 kg/m2 Extreme obesity (Class III) Increased incidence by 254%   Patient's current BMI Ideal Body weight  There is no height or weight on file to calculate BMI. Patient weight not recorded   BMI Readings from Last 4 Encounters:  06/13/21 28.29 kg/m  02/15/21 26.50 kg/m  12/22/20 29.84 kg/m  11/06/20 29.74 kg/m   Wt Readings from Last 4 Encounters:  06/13/21 200 lb (90.7 kg)  02/15/21 195 lb 6.4 oz (88.6 kg)  12/22/20 220 lb (99.8 kg)  11/06/20 195 lb 9.6 oz (88.7 kg)    Psych/Mental status: Alert, oriented x 3 (person, place, & time)       Eyes: PERLA Respiratory: No evidence of acute respiratory distress  Assessment & Plan  Primary  Diagnosis & Pertinent Problem List: There were no encounter diagnoses.  Visit Diagnosis: No diagnosis found. Problems updated and reviewed during this visit: No problems updated.  Plan of Care  Pharmacotherapy (Medications Ordered): No orders of the defined types were placed in this encounter.  Procedure Orders    No procedure(s) ordered today   Lab Orders  No laboratory test(s) ordered today   Imaging Orders  No imaging studies ordered today   Referral Orders  No referral(s) requested today    Pharmacological management options:  Opioid Analgesics: I will not be prescribing any opioids at this time Membrane stabilizer: I will not be prescribing any at this time Muscle relaxant: I will not be prescribing any at this time NSAID: I will not be prescribing any at this time Other analgesic(s): I will not be prescribing any at this time     Interventional Therapies  Risk  Complexity Considerations:   Estimated body mass index is 28.29 kg/m as calculated from the following:   Height as of this encounter: 5' 10.5" (1.791 m).   Weight as of this encounter: 200 lb (90.7 kg). History of: abdominal aortic aneurysm (AAA); hypertension; cardiomyopathy; coronary artery disease; dissection of abdominal aorta; orthostatic hypotension; irregular heart rhythm; chronic kidney disease; shortness of breath; tobacco abuse; COPD; GERD   Planned  Pending:   Pending further evaluation   Under consideration:   Diagnostic right lumbar facet MBB #1  Diagnostic right LESI #1  Diagnostic right TFESI #1    Completed:   None at this time   Completed by other providers:   Diagnostic/therapeutic bilateral IA steroid knee inj. (03/15/2016) by Carlynn Spry, PA ([email or web address])    Therapeutic  Palliative (PRN) options:   None established     Provider-requested follow-up: No follow-ups on file. Recent Visits Date Type Provider Dept  06/13/21 Office Visit Milinda Pointer, MD [facility]  Showing recent visits within past 90 days and meeting all other requirements Future Appointments Date Type Provider Dept  07/23/21 Appointment Milinda Pointer, MD [facility]  Showing future appointments within next 90 days and meeting all other requirements  Primary Care Physician: Olin Hauser, DO Note by: Gaspar Cola, MD Date: 07/23/2021; Time: 7:49 AM

## 2021-07-23 ENCOUNTER — Other Ambulatory Visit: Payer: Self-pay | Admitting: Family Medicine

## 2021-07-23 ENCOUNTER — Ambulatory Visit: Payer: Medicare Other | Attending: Pain Medicine | Admitting: Pain Medicine

## 2021-07-23 ENCOUNTER — Encounter: Payer: Self-pay | Admitting: Pain Medicine

## 2021-07-23 VITALS — BP 126/82 | HR 68 | Temp 97.3°F | Ht 71.0 in | Wt 200.0 lb

## 2021-07-23 DIAGNOSIS — M8580 Other specified disorders of bone density and structure, unspecified site: Secondary | ICD-10-CM | POA: Diagnosis not present

## 2021-07-23 DIAGNOSIS — M51379 Other intervertebral disc degeneration, lumbosacral region without mention of lumbar back pain or lower extremity pain: Secondary | ICD-10-CM

## 2021-07-23 DIAGNOSIS — M4186 Other forms of scoliosis, lumbar region: Secondary | ICD-10-CM

## 2021-07-23 DIAGNOSIS — Z79899 Other long term (current) drug therapy: Secondary | ICD-10-CM

## 2021-07-23 DIAGNOSIS — M8588 Other specified disorders of bone density and structure, other site: Secondary | ICD-10-CM | POA: Diagnosis not present

## 2021-07-23 DIAGNOSIS — F5105 Insomnia due to other mental disorder: Secondary | ICD-10-CM

## 2021-07-23 DIAGNOSIS — E559 Vitamin D deficiency, unspecified: Secondary | ICD-10-CM

## 2021-07-23 DIAGNOSIS — F419 Anxiety disorder, unspecified: Secondary | ICD-10-CM

## 2021-07-23 DIAGNOSIS — M79604 Pain in right leg: Secondary | ICD-10-CM | POA: Insufficient documentation

## 2021-07-23 DIAGNOSIS — M5137 Other intervertebral disc degeneration, lumbosacral region: Secondary | ICD-10-CM | POA: Insufficient documentation

## 2021-07-23 DIAGNOSIS — M461 Sacroiliitis, not elsewhere classified: Secondary | ICD-10-CM | POA: Insufficient documentation

## 2021-07-23 DIAGNOSIS — M25551 Pain in right hip: Secondary | ICD-10-CM | POA: Diagnosis not present

## 2021-07-23 DIAGNOSIS — M47816 Spondylosis without myelopathy or radiculopathy, lumbar region: Secondary | ICD-10-CM | POA: Diagnosis not present

## 2021-07-23 DIAGNOSIS — M545 Low back pain, unspecified: Secondary | ICD-10-CM | POA: Diagnosis not present

## 2021-07-23 DIAGNOSIS — G8929 Other chronic pain: Secondary | ICD-10-CM | POA: Diagnosis not present

## 2021-07-23 DIAGNOSIS — G894 Chronic pain syndrome: Secondary | ICD-10-CM | POA: Diagnosis not present

## 2021-07-23 DIAGNOSIS — Z79891 Long term (current) use of opiate analgesic: Secondary | ICD-10-CM | POA: Diagnosis not present

## 2021-07-23 DIAGNOSIS — E538 Deficiency of other specified B group vitamins: Secondary | ICD-10-CM

## 2021-07-23 DIAGNOSIS — M160 Bilateral primary osteoarthritis of hip: Secondary | ICD-10-CM

## 2021-07-23 MED ORDER — ERGOCALCIFEROL 1.25 MG (50000 UT) PO CAPS: 50000.0000 [IU] | ORAL_CAPSULE | ORAL | 0 refills | Status: DC

## 2021-07-23 MED ORDER — VITAMIN D3 125 MCG (5000 UT) PO CAPS: 1.0000 | ORAL_CAPSULE | Freq: Every day | ORAL | 2 refills | Status: AC

## 2021-07-23 MED ORDER — VITAMIN B-12 5000 MCG SL SUBL: 5000.0000 ug | SUBLINGUAL_TABLET | Freq: Every day | SUBLINGUAL | 0 refills | Status: DC

## 2021-07-23 MED ORDER — OXYCODONE HCL 5 MG PO TABS: 5.0000 mg | ORAL_TABLET | Freq: Two times a day (BID) | ORAL | 0 refills | Status: DC | PRN

## 2021-07-23 NOTE — Patient Instructions (Addendum)
______________________________________________________________________  Preparing for Procedure with Sedation  NOTICE: Due to recent regulatory changes, starting on September 11, 2020, procedures requiring intravenous (IV) sedation will no longer be performed at the [facility].  These types of procedures are required to be performed at [facility] ambulatory surgery facility.  We are very sorry for the inconvenience.  Procedure appointments are limited to planned procedures: No Prescription Refills. No disability issues will be discussed. No medication changes will be discussed.  Instructions: Oral Intake: Do not eat or drink anything for at least 8 hours prior to your procedure. (Exception: Blood Pressure Medication. See below.) Transportation: A driver is required. You may not drive yourself after the procedure. Blood Pressure Medicine: Do not forget to take your blood pressure medicine with a sip of water the morning of the procedure. If your Diastolic (lower reading) is above 100 mmHg, elective cases will be cancelled/rescheduled. Blood thinners: These will need to be stopped for procedures. Notify our staff if you are taking any blood thinners. Depending on which one you take, there will be specific instructions on how and when to stop it. Diabetics on insulin: Notify the staff so that you can be scheduled 1st case in the morning. If your diabetes requires high dose insulin, take only  of your normal insulin dose the morning of the procedure and notify the staff that you have done so. Preventing infections: Shower with an antibacterial soap the morning of your procedure. Build-up your immune system: Take 1000 mg of Vitamin C with every meal (3 times a day) the day prior to your procedure. Antibiotics: Inform the staff if you have a condition or reason that requires you to take antibiotics before dental procedures. Pregnancy: If you are pregnant, call and cancel the procedure. Sickness: If  you have a cold, fever, or any active infections, call and cancel the procedure. Arrival: You must be in the facility at least 30 minutes prior to your scheduled procedure. Children: Do not bring children with you. Dress appropriately: There is always the possibility that your clothing may get soiled. Valuables: Do not bring any jewelry or valuables.  Reasons to call and reschedule or cancel your procedure: (Following these recommendations will minimize the risk of a serious complication.) Surgeries: Avoid having procedures within 2 weeks of any surgery. (Avoid for 2 weeks before or after any surgery). Flu Shots: Avoid having procedures within 2 weeks of a flu shots. (Avoid for 2 weeks before or after immunizations). Barium: Avoid having a procedure within 7-10 days after having had a radiological study involving the use of radiological contrast. (Myelograms, Barium swallow or enema study). Heart attacks: Avoid any elective procedures or surgeries for the initial 6 months after a "Myocardial Infarction" (Heart Attack). Blood thinners: It is imperative that you stop these medications before procedures. Let us know if you if you take any blood thinner.  Infection: Avoid procedures during or within two weeks of an infection (including chest colds or gastrointestinal problems). Symptoms associated with infections include: Localized redness, fever, chills, night sweats or profuse sweating, burning sensation when voiding, cough, congestion, stuffiness, runny nose, sore throat, diarrhea, nausea, vomiting, cold or Flu symptoms, recent or current infections. It is specially important if the infection is over the area that we intend to treat. Heart and lung problems: Symptoms that may suggest an active cardiopulmonary problem include: cough, chest pain, breathing difficulties or shortness of breath, dizziness, ankle swelling, uncontrolled high or unusually low blood pressure, and/or palpitations. If you are    experiencing any of these symptoms, cancel your procedure and contact your primary care physician for an evaluation.  Remember:  Regular Business hours are:  Monday to Thursday 8:00 AM to 4:00 PM  Provider's Schedule: Briseyda Fehr, MD:  Procedure days: Tuesday and Thursday 7:30 AM to 4:00 PM  Bilal Lateef, MD:  Procedure days: Monday and Wednesday 7:30 AM to 4:00 PM ______________________________________________________________________  ____________________________________________________________________________________________  General Risks and Possible Complications  Patient Responsibilities: It is important that you read this as it is part of your informed consent. It is our duty to inform you of the risks and possible complications associated with treatments offered to you. It is your responsibility as a patient to read this and to ask questions about anything that is not clear or that you believe was not covered in this document.  Patient's Rights: You have the right to refuse treatment. You also have the right to change your mind, even after initially having agreed to have the treatment done. However, under this last option, if you wait until the last second to change your mind, you may be charged for the materials used up to that point.  Introduction: Medicine is not an exact science. Everything in Medicine, including the lack of treatment(s), carries the potential for danger, harm, or loss (which is by definition: Risk). In Medicine, a complication is a secondary problem, condition, or disease that can aggravate an already existing one. All treatments carry the risk of possible complications. The fact that a side effects or complications occurs, does not imply that the treatment was conducted incorrectly. It must be clearly understood that these can happen even when everything is done following the highest safety standards.  No treatment: You can choose not to proceed with the  proposed treatment alternative. The "PRO(s)" would include: avoiding the risk of complications associated with the therapy. The "CON(s)" would include: not getting any of the treatment benefits. These benefits fall under one of three categories: diagnostic; therapeutic; and/or palliative. Diagnostic benefits include: getting information which can ultimately lead to improvement of the disease or symptom(s). Therapeutic benefits are those associated with the successful treatment of the disease. Finally, palliative benefits are those related to the decrease of the primary symptoms, without necessarily curing the condition (example: decreasing the pain from a flare-up of a chronic condition, such as incurable terminal cancer).  General Risks and Complications: These are associated to most interventional treatments. They can occur alone, or in combination. They fall under one of the following six (6) categories: no benefit or worsening of symptoms; bleeding; infection; nerve damage; allergic reactions; and/or death. No benefits or worsening of symptoms: In Medicine there are no guarantees, only probabilities. No healthcare provider can ever guarantee that a medical treatment will work, they can only state the probability that it may. Furthermore, there is always the possibility that the condition may worsen, either directly, or indirectly, as a consequence of the treatment. Bleeding: This is more common if the patient is taking a blood thinner, either prescription or over the counter (example: Goody Powders, Fish oil, Aspirin, Garlic, etc.), or if suffering a condition associated with impaired coagulation (example: Hemophilia, cirrhosis of the liver, low platelet counts, etc.). However, even if you do not have one on these, it can still happen. If you have any of these conditions, or take one of these drugs, make sure to notify your treating physician. Infection: This is more common in patients with a compromised  immune system, either due to disease (example:   diabetes, cancer, human immunodeficiency virus [HIV], etc.), or due to medications or treatments (example: therapies used to treat cancer and rheumatological diseases). However, even if you do not have one on these, it can still happen. If you have any of these conditions, or take one of these drugs, make sure to notify your treating physician. Nerve Damage: This is more common when the treatment is an invasive one, but it can also happen with the use of medications, such as those used in the treatment of cancer. The damage can occur to small secondary nerves, or to large primary ones, such as those in the spinal cord and brain. This damage may be temporary or permanent and it may lead to impairments that can range from temporary numbness to permanent paralysis and/or brain death. Allergic Reactions: Any time a substance or material comes in contact with our body, there is the possibility of an allergic reaction. These can range from a mild skin rash (contact dermatitis) to a severe systemic reaction (anaphylactic reaction), which can result in death. Death: In general, any medical intervention can result in death, most of the time due to an unforeseen complication. ____________________________________________________________________________________________   ____________________________________________________________________________________________  Medication Rules  Purpose: To inform patients, and their family members, of our rules and regulations.  Applies to: All patients receiving prescriptions (written or electronic).  Pharmacy of record: Pharmacy where electronic prescriptions will be sent. If written prescriptions are taken to a different pharmacy, please inform the nursing staff. The pharmacy listed in the electronic medical record should be the one where you would like electronic prescriptions to be sent.  Electronic prescriptions: In  compliance with the [place] (STOP) Act of 2017 (Session Lanny Cramp [phone]), effective February 11, 2018, all controlled substances must be electronically prescribed. Calling prescriptions to the pharmacy will cease to exist.  Prescription refills: Only during scheduled appointments. Applies to all prescriptions.  NOTE: The following applies primarily to controlled substances (Opioid* Pain Medications).   Type of encounter (visit): For patients receiving controlled substances, face-to-face visits are required. (Not an option or up to the patient.)  Patient's responsibilities: Pain Pills: Bring all pain pills to every appointment (except for procedure appointments). Pill Bottles: Bring pills in original pharmacy bottle. Always bring the newest bottle. Bring bottle, even if empty. Medication refills: You are responsible for knowing and keeping track of what medications you take and those you need refilled. The day before your appointment: write a list of all prescriptions that need to be refilled. The day of the appointment: give the list to the admitting nurse. Prescriptions will be written only during appointments. No prescriptions will be written on procedure days. If you forget a medication: it will not be "Called in", "Faxed", or "electronically sent". You will need to get another appointment to get these prescribed. No early refills. Do not call asking to have your prescription filled early. Prescription Accuracy: You are responsible for carefully inspecting your prescriptions before leaving our office. Have the discharge nurse carefully go over each prescription with you, before taking them home. Make sure that your name is accurately spelled, that your address is correct. Check the name and dose of your medication to make sure it is accurate. Check the number of pills, and the written instructions to make sure they are clear and accurate. Make sure that  you are given enough medication to last until your next medication refill appointment. Taking Medication: Take medication as prescribed. When it comes to controlled substances,  taking less pills or less frequently than prescribed is permitted and encouraged. Never take more pills than instructed. Never take medication more frequently than prescribed.  Inform other Doctors: Always inform, all of your healthcare providers, of all the medications you take. Pain Medication from other Providers: You are not allowed to accept any additional pain medication from any other Doctor or Healthcare provider. There are two exceptions to this rule. (see below) In the event that you require additional pain medication, you are responsible for notifying us, as stated below. Cough Medicine: Often these contain an opioid, such as codeine or hydrocodone. Never accept or take cough medicine containing these opioids if you are already taking an opioid* medication. The combination may cause respiratory failure and death. Medication Agreement: You are responsible for carefully reading and following our Medication Agreement. This must be signed before receiving any prescriptions from our practice. Safely store a copy of your signed Agreement. Violations to the Agreement will result in no further prescriptions. (Additional copies of our Medication Agreement are available upon request.) Laws, Rules, & Regulations: All patients are expected to follow all Federal and [employer], [employer], Rules, [employer]. Ignorance of the Laws does not constitute a valid excuse.  Illegal drugs and Controlled Substances: The use of illegal substances (including, but not limited to marijuana and its derivatives) and/or the illegal use of any controlled substances is strictly prohibited. Violation of this rule may result in the immediate and permanent discontinuation of any and all prescriptions being written by our practice. The use of any illegal  substances is prohibited. Adopted CDC guidelines & recommendations: Target dosing levels will be at or below 60 MME/day. Use of benzodiazepines** is not recommended.  Exceptions: There are only two exceptions to the rule of not receiving pain medications from other Healthcare Providers. Exception #1 (Emergencies): In the event of an emergency (i.e.: accident requiring emergency care), you are allowed to receive additional pain medication. However, you are responsible for: As soon as you are able, call our office [phone], at any time of the day or night, and leave a message stating your name, the date and nature of the emergency, and the name and dose of the medication prescribed. In the event that your call is answered by a member of our staff, make sure to document and save the date, time, and the name of the person that took your information.  Exception #2 (Planned Surgery): In the event that you are scheduled by another doctor or dentist to have any type of surgery or procedure, you are allowed (for a period no longer than 30 days), to receive additional pain medication, for the acute post-op pain. However, in this case, you are responsible for picking up a copy of our "Post-op Pain Management for Surgeons" handout, and giving it to your surgeon or dentist. This document is available at our office, and does not require an appointment to obtain it. Simply go to our office during business hours (Monday-Thursday from 8:00 AM to 4:00 PM) (Friday 8:00 AM to 12:00 Noon) or if you have a scheduled appointment with [place], prior to your surgery, and ask for it by name. In addition, you are responsible for: calling our office [phone], at any time of the day or night, and leaving a message stating your name, name of your surgeon, type of surgery, and date of procedure or surgery. Failure to comply with your responsibilities may result in termination of therapy involving the controlled substances. Medication  Agreement Violation. Following the above rules, including your responsibilities will help you in avoiding a Medication Agreement Violation ("Breaking your Pain Medication Contract").  *Opioid medications include: morphine, codeine, oxycodone, oxymorphone, hydrocodone, hydromorphone, meperidine, tramadol, tapentadol, buprenorphine, fentanyl, methadone. **Benzodiazepine medications include: diazepam (Valium), alprazolam (Xanax), clonazepam (Klonopine), lorazepam (Ativan), clorazepate (Tranxene), chlordiazepoxide (Librium), estazolam (Prosom), oxazepam (Serax), temazepam (Restoril), triazolam (Halcion) (Last updated: 11/08/2020) ____________________________________________________________________________________________  ____________________________________________________________________________________________  Medication Recommendations and Reminders  Applies to: All patients receiving prescriptions (written and/or electronic).  Medication Rules & Regulations: These rules and regulations exist for your safety and that of others. They are not flexible and neither are we. Dismissing or ignoring them will be considered "non-compliance" with medication therapy, resulting in complete and irreversible termination of such therapy. (See document titled "Medication Rules" for more details.) In all conscience, because of safety reasons, we cannot continue providing a therapy where the patient does not follow instructions.  Pharmacy of record:  Definition: This is the pharmacy where your electronic prescriptions will be sent.  We do not endorse any particular pharmacy, however, we have experienced problems with [facility] not securing enough medication supply for the community. We do not restrict you in your choice of pharmacy. However, once we write for your prescriptions, we will NOT be re-sending more prescriptions to fix restricted supply problems created by your pharmacy, or your insurance.  The pharmacy  listed in the electronic medical record should be the one where you want electronic prescriptions to be sent. If you choose to change pharmacy, simply notify our nursing staff.  Recommendations: Keep all of your pain medications in a safe place, under lock and key, even if you live alone. We will NOT replace lost, stolen, or damaged medication. After you fill your prescription, take 1 week's worth of pills and put them away in a safe place. You should keep a separate, properly labeled bottle for this purpose. The remainder should be kept in the original bottle. Use this as your primary supply, until it runs out. Once it's gone, then you know that you have 1 week's worth of medicine, and it is time to come in for a prescription refill. If you do this correctly, it is unlikely that you will ever run out of medicine. To make sure that the above recommendation works, it is very important that you make sure your medication refill appointments are scheduled at least 1 week before you run out of medicine. To do this in an effective manner, make sure that you do not leave the office without scheduling your next medication management appointment. Always ask the nursing staff to show you in your prescription , when your medication will be running out. Then arrange for the receptionist to get you a return appointment, at least 7 days before you run out of medicine. Do not wait until you have 1 or 2 pills left, to come in. This is very poor planning and does not take into consideration that we may need to cancel appointments due to bad weather, sickness, or emergencies affecting our staff. DO NOT ACCEPT A "Partial Fill": If for any reason your pharmacy does not have enough pills/tablets to completely fill or refill your prescription, do not allow for a "partial fill". The law allows the pharmacy to complete that prescription within 72 hours, without requiring a new prescription. If they do not fill the rest of your  prescription within those 72 hours, you will need a separate prescription to fill the remaining amount, which we will NOT  provide. If the reason for the partial fill is your insurance, you will need to talk to the pharmacist about payment alternatives for the remaining tablets, but again, DO NOT ACCEPT A PARTIAL FILL, unless you can trust your pharmacist to obtain the remainder of the pills within 72 hours.  Prescription refills and/or changes in medication(s):  Prescription refills, and/or changes in dose or medication, will be conducted only during scheduled medication management appointments. (Applies to both, written and electronic prescriptions.) No refills on procedure days. No medication will be changed or started on procedure days. No changes, adjustments, and/or refills will be conducted on a procedure day. Doing so will interfere with the diagnostic portion of the procedure. No phone refills. No medications will be "called into the pharmacy". No Fax refills. No weekend refills. No Holliday refills. No after hours refills.  Remember:  Business hours are:  Monday to Thursday 8:00 AM to 4:00 PM Provider's Schedule: Milinda Pointer, MD - Appointments are:  Medication management: Monday and Wednesday 8:00 AM to 4:00 PM Procedure day: Tuesday and Thursday 7:30 AM to 4:00 PM Gillis Santa, MD - Appointments are:  Medication management: Tuesday and Thursday 8:00 AM to 4:00 PM Procedure day: Monday and Wednesday 7:30 AM to 4:00 PM (Last update: 09/01/2019) ____________________________________________________________________________________________  ____________________________________________________________________________________________  CBD (cannabidiol) & Delta-8 (Delta-8 tetrahydrocannabinol) WARNING  Intro: Cannabidiol (CBD) and tetrahydrocannabinol (THC), are two natural compounds found in plants of the Cannabis genus. They can both be extracted from hemp or cannabis. Hemp and  cannabis come from the Cannabis sativa plant. Both compounds interact with your body's endocannabinoid system, but they have very different effects. CBD does not produce the high sensation associated with cannabis. Delta-8 tetrahydrocannabinol, also known as delta-8 THC, is a psychoactive substance found in the Cannabis sativa plant, of which marijuana and hemp are two varieties. THC is responsible for the high associated with the illicit use of marijuana.  Applicable to: All individuals currently taking or considering taking CBD (cannabidiol) and, more important, all patients taking opioid analgesic controlled substances (pain medication). (Example: oxycodone; oxymorphone; hydrocodone; hydromorphone; morphine; methadone; tramadol; tapentadol; fentanyl; buprenorphine; butorphanol; dextromethorphan; meperidine; codeine; etc.)  Legal status: CBD remains a Schedule I drug prohibited for any use. CBD is illegal with one exception. In the [place], CBD has a limited Transport planner (FDA) approval for the treatment of two specific types of epilepsy disorders. Only one CBD product has been approved by the FDA for this purpose: "Epidiolex". FDA is aware that some companies are marketing products containing cannabis and cannabis-derived compounds in ways that violate the [employer], Drug and Cosmetic Act [facility] Act) and that may put the health and safety of consumers at risk. The FDA, a Federal agency, has not enforced the CBD status since 2018. UPDATE: (03/30/2021) The Drug Enforcement Agency ([place]) issued a letter stating that "delta" cannabinoids, including Delta-8-THCO and Delta-9-THCO, synthetically derived from hemp do not qualify as hemp and will be viewed as Schedule I drugs. (Schedule I drugs, substances, or chemicals are defined as drugs with no currently accepted medical use and a high potential for abuse. Some examples of Schedule I drugs are: heroin, lysergic acid diethylamide (LSD),  marijuana (cannabis), 3,4-methylenedioxymethamphetamine (ecstasy), methaqualone, and peyote.) (https://jennings.com/)  Legality: Some manufacturers ship CBD products nationally, which is illegal. Often such products are sold online and are therefore available throughout the country. CBD is openly sold in head shops and health food stores in some states where such sales have not been explicitly  legalized. Selling unapproved products with unsubstantiated therapeutic claims is not only a violation of the law, but also can put patients at risk, as these products have not been proven to be safe or effective. Federal illegality makes it difficult to conduct research on CBD.  Reference: "FDA Regulation of Cannabis and Cannabis-Derived Products, Including Cannabidiol (CBD)" - [email or web address].pt  Warning: CBD is not FDA approved and has not undergo the same manufacturing controls as prescription drugs.  This means that the purity and safety of available CBD may be questionable. Most of the time, despite manufacturer's claims, it is contaminated with THC (delta-9-tetrahydrocannabinol - the chemical in marijuana responsible for the "HIGH").  When this is the case, the [facility] contaminant will trigger a positive urine drug screen (UDS) test for Marijuana (carboxy-THC). Because a positive UDS for any illicit substance is a violation of our medication agreement, your opioid analgesics (pain medicine) may be permanently discontinued. The FDA recently put out a warning about 5 things that everyone should be aware of regarding Delta-8 THC: Delta-8 THC products have not been evaluated or approved by the FDA for safe use and may be marketed in ways that put the public health at risk. The FDA has received adverse event reports involving delta-8 THC-containing products. Delta-8 THC has psychoactive and intoxicating  effects. Delta-8 THC manufacturing often involve use of potentially harmful chemicals to create the concentrations of delta-8 THC claimed in the marketplace. The final delta-8 THC product may have potentially harmful by-products (contaminants) due to the chemicals used in the process. Manufacturing of delta-8 THC products may occur in uncontrolled or unsanitary settings, which may lead to the presence of unsafe contaminants or other potentially harmful substances. Delta-8 THC products should be kept out of the reach of children and pets.  MORE ABOUT CBD  General Information: CBD was discovered in [birth year] and it is a derivative of the cannabis sativa genus plants (Marijuana and Hemp). It is one of the 113 identified substances found in Marijuana. It accounts for up to 40% of the plant's extract. As of 2018, preliminary clinical studies on CBD included research for the treatment of anxiety, movement disorders, and pain. CBD is available and consumed in multiple forms, including inhalation of smoke or vapor, as an aerosol spray, and by mouth. It may be supplied as an oil containing CBD, capsules, dried cannabis, or as a liquid solution. CBD is thought not to be as psychoactive as THC (delta-9-tetrahydrocannabinol - the chemical in marijuana responsible for the "HIGH"). Studies suggest that CBD may interact with different biological target receptors in the body, including cannabinoid and other neurotransmitter receptors. As of 2018 the mechanism of action for its biological effects has not been determined.  Side-effects  Adverse reactions: Dry mouth, diarrhea, decreased appetite, fatigue, drowsiness, malaise, weakness, sleep disturbances, and others.  Drug interactions: CBC may interact with other medications such as blood-thinners. Because CBD causes drowsiness on its own, it also increases the drowsiness caused by other medications, including antihistamines (such as Benadryl), benzodiazepines (Xanax, Ativan,  Valium), antipsychotics, antidepressants and opioids, as well as alcohol and supplements such as kava, melatonin and St. John's Wort. Be cautious with the following combinations:   Brivaracetam (Briviact) Brivaracetam is changed and broken down by the body. CBD might decrease how quickly the body breaks down brivaracetam. This might increase levels of brivaracetam in the body.  Caffeine Caffeine is changed and broken down by the body. CBD might decrease how quickly the body breaks down caffeine. This might increase levels  of caffeine in the body.  Carbamazepine (Tegretol) Carbamazepine is changed and broken down by the body. CBD might decrease how quickly the body breaks down carbamazepine. This might increase levels of carbamazepine in the body and increase its side effects.  Citalopram (Celexa) Citalopram is changed and broken down by the body. CBD might decrease how quickly the body breaks down citalopram. This might increase levels of citalopram in the body and increase its side effects.  Clobazam (Onfi) Clobazam is changed and broken down by the liver. CBD might decrease how quickly the liver breaks down clobazam. This might increase the effects and side effects of clobazam.  Eslicarbazepine (Aptiom) Eslicarbazepine is changed and broken down by the body. CBD might decrease how quickly the body breaks down eslicarbazepine. This might increase levels of eslicarbazepine in the body by a small amount.  Everolimus (Zostress) Everolimus is changed and broken down by the body. CBD might decrease how quickly the body breaks down everolimus. This might increase levels of everolimus in the body.  Lithium Taking higher doses of CBD might increase levels of lithium. This can increase the risk of lithium toxicity.  Medications changed by the liver (Cytochrome P450 1A1 (CYP1A1) substrates) Some medications are changed and broken down by the liver. CBD might change how quickly the liver breaks down  these medications. This could change the effects and side effects of these medications.  Medications changed by the liver (Cytochrome P450 1A2 (CYP1A2) substrates) Some medications are changed and broken down by the liver. CBD might change how quickly the liver breaks down these medications. This could change the effects and side effects of these medications.  Medications changed by the liver (Cytochrome P450 1B1 (CYP1B1) substrates) Some medications are changed and broken down by the liver. CBD might change how quickly the liver breaks down these medications. This could change the effects and side effects of these medications.  Medications changed by the liver (Cytochrome P450 2A6 (CYP2A6) substrates) Some medications are changed and broken down by the liver. CBD might change how quickly the liver breaks down these medications. This could change the effects and side effects of these medications.  Medications changed by the liver (Cytochrome P450 2B6 (CYP2B6) substrates) Some medications are changed and broken down by the liver. CBD might change how quickly the liver breaks down these medications. This could change the effects and side effects of these medications.  Medications changed by the liver (Cytochrome P450 2C19 (CYP2C19) substrates) Some medications are changed and broken down by the liver. CBD might change how quickly the liver breaks down these medications. This could change the effects and side effects of these medications.  Medications changed by the liver (Cytochrome P450 2C8 (CYP2C8) substrates) Some medications are changed and broken down by the liver. CBD might change how quickly the liver breaks down these medications. This could change the effects and side effects of these medications.  Medications changed by the liver (Cytochrome P450 2C9 (CYP2C9) substrates) Some medications are changed and broken down by the liver. CBD might change how quickly the liver breaks down these  medications. This could change the effects and side effects of these medications.  Medications changed by the liver (Cytochrome P450 2D6 (CYP2D6) substrates) Some medications are changed and broken down by the liver. CBD might change how quickly the liver breaks down these medications. This could change the effects and side effects of these medications.  Medications changed by the liver (Cytochrome P450 2E1 (CYP2E1) substrates) Some medications are changed and  broken down by the liver. CBD might change how quickly the liver breaks down these medications. This could change the effects and side effects of these medications.  Medications changed by the liver (Cytochrome P450 3A4 (CYP3A4) substrates) Some medications are changed and broken down by the liver. CBD might change how quickly the liver breaks down these medications. This could change the effects and side effects of these medications.  Medications changed by the liver (Glucuronidated drugs) Some medications are changed and broken down by the liver. CBD might change how quickly the liver breaks down these medications. This could change the effects and side effects of these medications.  Medications that decrease the breakdown of other medications by the liver (Cytochrome P450 2C19 (CYP2C19) inhibitors) CBD is changed and broken down by the liver. Some drugs decrease how quickly the liver changes and breaks down CBD. This could change the effects and side effects of CBD.  Medications that decrease the breakdown of other medications in the liver (Cytochrome P450 3A4 (CYP3A4) inhibitors) CBD is changed and broken down by the liver. Some drugs decrease how quickly the liver changes and breaks down CBD. This could change the effects and side effects of CBD.  Medications that increase breakdown of other medications by the liver (Cytochrome P450 3A4 (CYP3A4) inducers) CBD is changed and broken down by the liver. Some drugs increase how quickly the  liver changes and breaks down CBD. This could change the effects and side effects of CBD.  Medications that increase the breakdown of other medications by the liver (Cytochrome P450 2C19 (CYP2C19) inducers) CBD is changed and broken down by the liver. Some drugs increase how quickly the liver changes and breaks down CBD. This could change the effects and side effects of CBD.  Methadone (Dolophine) Methadone is broken down by the liver. CBD might decrease how quickly the liver breaks down methadone. Taking cannabidiol along with methadone might increase the effects and side effects of methadone.  Rufinamide (Banzel) Rufinamide is changed and broken down by the body. CBD might decrease how quickly the body breaks down rufinamide. This might increase levels of rufinamide in the body by a small amount.  Sedative medications (CNS depressants) CBD might cause sleepiness and slowed breathing. Some medications, called sedatives, can also cause sleepiness and slowed breathing. Taking CBD with sedative medications might cause breathing problems and/or too much sleepiness.  Sirolimus (Rapamune) Sirolimus is changed and broken down by the body. CBD might decrease how quickly the body breaks down sirolimus. This might increase levels of sirolimus in the body.  Stiripentol (Diacomit) Stiripentol is changed and broken down by the body. CBD might decrease how quickly the body breaks down stiripentol. This might increase levels of stiripentol in the body and increase its side effects.  Tacrolimus (Prograf) Tacrolimus is changed and broken down by the body. CBD might decrease how quickly the body breaks down tacrolimus. This might increase levels of tacrolimus in the body.  Tamoxifen (Soltamox) Tamoxifen is changed and broken down by the body. CBD might affect how quickly the body breaks down tamoxifen. This might affect levels of tamoxifen in the body.  Topiramate (Topamax) Topiramate is changed and broken  down by the body. CBD might decrease how quickly the body breaks down topiramate. This might increase levels of topiramate in the body by a small amount.  Valproate Valproic acid can cause liver injury. Taking cannabidiol with valproic acid might increase the chance of liver injury. CBD and/or valproic acid might need to be  stopped, or the dose might need to be reduced.  Warfarin (Coumadin) CBD might increase levels of warfarin, which can increase the risk for bleeding. CBD and/or warfarin might need to be stopped, or the dose might need to be reduced.  Zonisamide Zonisamide is changed and broken down by the body. CBD might decrease how quickly the body breaks down zonisamide. This might increase levels of zonisamide in the body by a small amount. (Last update: 04/11/2021) ____________________________________________________________________________________________  ____________________________________________________________________________________________  Drug Holidays (Slow)  What is a "Drug Holiday"? Drug Holiday: is the name given to the period of time during which a patient stops taking a medication(s) for the purpose of eliminating tolerance to the drug.  Benefits Improved effectiveness of opioids. Decreased opioid dose needed to achieve benefits. Improved pain with lesser dose.  What is tolerance? Tolerance: is the progressive decreased in effectiveness of a drug due to its repetitive use. With repetitive use, the body gets use to the medication and as a consequence, it loses its effectiveness. This is a common problem seen with opioid pain medications. As a result, a larger dose of the drug is needed to achieve the same effect that used to be obtained with a smaller dose.  How long should a "Drug Holiday" last? You should stay off of the pain medicine for at least 14 consecutive days. (2 weeks)  Should I stop the medicine "cold [place]"? No. You should always coordinate with  your Pain Specialist so that he/she can provide you with the correct medication dose to make the transition as smoothly as possible.  How do I stop the medicine? Slowly. You will be instructed to decrease the daily amount of pills that you take by one (1) pill every seven (7) days. This is called a "slow downward taper" of your dose. For example: if you normally take four (4) pills per day, you will be asked to drop this dose to three (3) pills per day for seven (7) days, then to two (2) pills per day for seven (7) days, then to one (1) per day for seven (7) days, and at the end of those last seven (7) days, this is when the "Drug Holiday" would start.   Will I have withdrawals? By doing a "slow downward taper" like this one, it is unlikely that you will experience any significant withdrawal symptoms. Typically, what triggers withdrawals is the sudden stop of a high dose opioid therapy. Withdrawals can usually be avoided by slowly decreasing the dose over a prolonged period of time. If you do not follow these instructions and decide to stop your medication abruptly, withdrawals may be possible.  What are withdrawals? Withdrawals: refers to the wide range of symptoms that occur after stopping or dramatically reducing opiate drugs after heavy and prolonged use. Withdrawal symptoms do not occur to patients that use low dose opioids, or those who take the medication sporadically. Contrary to benzodiazepine (example: Valium, Xanax, etc.) or alcohol withdrawals ("Delirium Tremens"), opioid withdrawals are not lethal. Withdrawals are the physical manifestation of the body getting rid of the excess receptors.  Expected Symptoms Early symptoms of withdrawal may include: Agitation Anxiety Muscle aches Increased tearing Insomnia Runny nose Sweating Yawning  Late symptoms of withdrawal may include: Abdominal cramping Diarrhea Dilated pupils Goose bumps Nausea Vomiting  Will I experience  withdrawals? Due to the slow nature of the taper, it is very unlikely that you will experience any.  What is a slow taper? Taper: refers to the gradual decrease in  dose.  (Last update: 09/01/2019) ____________________________________________________________________________________________   ____________________________________________________________________________________________  Pharmacy Shortages of Pain Medication   Introduction Shockingly as it may seem, .  "No U.[place] Supreme Court decision has ever interpreted the Constitution as guaranteeing a right to health care for all Americans." - https://huff.com/  "With respect to human rights, the [place] has no formally codified right to health, nor does it participate in a human rights treaty that specifies a right to health." - Scott J. Schweikart, JD, MBE  Situation By now, most of our patients have had the experience of being told by their pharmacist that they do not have enough medication to cover their prescription. If you have not had this experience, just know that you soon will.  Problem There appears to be a shortage of these medications, either at the national level or locally. This is happening with all pharmacies. When there is not enough medication, patients are offered a partial fill and they are told that they will try to get the rest of the medicine for them at a later time. If they do not have enough for even a partial fill, the pharmacists are telling the patients to call us (the prescribing physicians) to request that we send another prescription to another pharmacy to get the medicine.   This reordering of a controlled substance creates documentation problems where additional paperwork needs to be created to explain why two prescriptions for the same period of time and the same medicine are being prescribed to the same patient. It also creates  situations where the last appointment note does not accurately reflect when and what prescriptions were given to a patient. This leads to prescribing errors down the line, in subsequent follow-up visits.   [employer] of Pharmacy ([employer]) Research revealed that Surveyor, quantity .1806 (21 NCAC 46.1806) authorizes pharmacists to the transfer of prescriptions among pharmacies, and it sets forth procedural and recordkeeping requirements for doing so. However, this requires the pharmacist to complete the previously mentioned procedural paperwork to accomplish the transfer. As it turns out, it is much easier for them to have the prescribing physicians do the work.   Possible solutions 1. Have the [facility] [place] add a provision to the "STOP ACT" (the law that mandates how controlled substances are prescribed) where there is an exception to the electronic prescribing rule that states that in the event there are shortages of medications the physicians are allowed to use written prescriptions as opposed to electronic ones. This would allow patients to take their prescriptions to a different pharmacy that may have enough medication available to fill the prescription. The problem is that currently there is a law that does not allow for written prescriptions, with the exception of instances where the electronic medical record is down due to technical issues.  2. Have [place] Congress ease the pressure on pharmaceutical companies, allowing them to produce enough quantities of the medication to adequately supply the population. 3. Have pharmacies keep enough stocks of these medications to cover their client base.  4. Have the [facility] Assembly add a provision to the "STOP ACT" where they ease the regulations surrounding the transfer of controlled substances between pharmacies, so as to simplify the transfer of supplies. As an alternative, develop a system to allow patients to obtain the  remainder of their prescription at another one of their pharmacies or at an associate pharmacy.   How this shortage will affect you.  The one thing that is  abundantly clear is that this is a pharmacy supply problem  and not a prescriber problem. The job of the prescriber is to evaluate and monitor the patients for the appropriate indications to the use of these medicines, the monitoring of their use and the prescribing of the appropriate dose and regimen. It is not the job of the prescriber to provide or dispense the actual medication. By law, this is the job of the pharmacies and pharmacists. It is certainly not the job of the prescriber to solve the supply problems.   Due to the above problems we are no longer taking patients to write for their pain medication. We will continue to evaluate for appropriate indications and we may provide recommendations regarding medication, dose, and schedule, as well as monitoring recommendations, however, we will not be taking over the actual prescribing of these substances. On those patients where we are treating their chronic pain with interventional therapies, exceptions will be considered on a case by case basis. At this time, we will try to continue providing this supplemental service to those patients we have been managing in the past. However, as of August 1st, 2023, we no longer will be sending additional prescriptions to other pharmacies for the purpose of solving their supply problems. Once we send a prescription to a pharmacy, we will not be resending it again to another pharmacy to cover for their shortages.   What to do. Write as many letters as you can. Recruit the help of family members in writing these letters. Below are some of the places where you can write to make your voice heard. Let them know what the problem is and push them to look for solutions.   Search internet for: "[employer] find your  legislators" NoseSwap.is  Search internet for: "[employer] commissioner complaints" Starlas.fi  Search internet for: "[place] complaints" https://www.hernandez-[email or web address]  Search internet for: "[facility] pharmacy complaints" Email [facility] Customer Relations [email or web address]  Search internet for: Programme researcher, broadcasting/film/video customer service complaints" [email or web address]  ____________________________________________________________________________________________

## 2021-07-23 NOTE — Progress Notes (Signed)
Safety precautions to be maintained throughout the outpatient stay will include: orient to surroundings, keep bed in low position, maintain call bell within reach at all times, provide assistance with transfer out of bed and ambulation.  

## 2021-07-24 ENCOUNTER — Ambulatory Visit: Payer: Medicaid Other | Admitting: Family Medicine

## 2021-07-24 NOTE — Telephone Encounter (Signed)
Requested medications are due for refill today.  yes  Requested medications are on the active medications list.  yes  Last refill. 12/19/2020 #30 2 refills  Future visit scheduled.   yes  Notes to clinic.  Medication refill is not delegated.    Requested Prescriptions  Pending Prescriptions Disp Refills   LORazepam (ATIVAN) 0.5 MG tablet [Pharmacy Med Name: LORAZEPAM 0.5 MG TAB] 30 tablet     Sig: TAKE 1 TABLET BY MOUTH AT BEDTIME AS NEEDED FOR ANXIETY     Not Delegated - Psychiatry: Anxiolytics/Hypnotics 2 Failed - 07/23/2021  3:53 PM      Failed - This refill cannot be delegated      Failed - Urine Drug Screen completed in last 360 days      Passed - Patient is not pregnant      Passed - Valid encounter within last 6 months    Recent Outpatient Visits           5 months ago Chronic right-sided low back pain with bilateral sciatica   [place], DO   7 months ago Acute intractable headache, unspecified headache type   [facility] [place], Coralie Keens, NP   8 months ago Annual physical exam   [facility] Olin Hauser, DO   10 months ago Bilateral lower extremity edema   [place], DO   1 year ago Intercostal muscle strain, initial encounter   [place], Devonne Doughty, DO       Future Appointments             In 1 week [place], Devonne Doughty, [facility], [facility]

## 2021-08-01 ENCOUNTER — Ambulatory Visit: Payer: Medicaid Other | Admitting: Family Medicine

## 2021-08-06 ENCOUNTER — Ambulatory Visit (INDEPENDENT_AMBULATORY_CARE_PROVIDER_SITE_OTHER): Payer: Medicare Other

## 2021-08-06 ENCOUNTER — Encounter: Payer: Self-pay | Admitting: Family Medicine

## 2021-08-06 ENCOUNTER — Ambulatory Visit (INDEPENDENT_AMBULATORY_CARE_PROVIDER_SITE_OTHER): Payer: Medicare Other | Admitting: Family Medicine

## 2021-08-06 ENCOUNTER — Other Ambulatory Visit: Payer: Self-pay | Admitting: Family Medicine

## 2021-08-06 VITALS — BP 94/56 | HR 76 | Temp 97.1°F | Resp 17 | Ht 67.25 in | Wt 195.0 lb

## 2021-08-06 DIAGNOSIS — R600 Localized edema: Secondary | ICD-10-CM

## 2021-08-06 DIAGNOSIS — N183 Chronic kidney disease, stage 3 unspecified: Secondary | ICD-10-CM | POA: Diagnosis not present

## 2021-08-06 DIAGNOSIS — M5136 Other intervertebral disc degeneration, lumbar region: Secondary | ICD-10-CM

## 2021-08-06 DIAGNOSIS — G8929 Other chronic pain: Secondary | ICD-10-CM

## 2021-08-06 DIAGNOSIS — Z0000 Encounter for general adult medical examination without abnormal findings: Secondary | ICD-10-CM

## 2021-08-06 DIAGNOSIS — I129 Hypertensive chronic kidney disease with stage 1 through stage 4 chronic kidney disease, or unspecified chronic kidney disease: Secondary | ICD-10-CM | POA: Diagnosis not present

## 2021-08-06 DIAGNOSIS — G894 Chronic pain syndrome: Secondary | ICD-10-CM

## 2021-08-06 DIAGNOSIS — I2510 Atherosclerotic heart disease of native coronary artery without angina pectoris: Secondary | ICD-10-CM | POA: Diagnosis not present

## 2021-08-06 DIAGNOSIS — J432 Centrilobular emphysema: Secondary | ICD-10-CM | POA: Diagnosis not present

## 2021-08-06 DIAGNOSIS — M51369 Other intervertebral disc degeneration, lumbar region without mention of lumbar back pain or lower extremity pain: Secondary | ICD-10-CM

## 2021-08-06 MED ORDER — POTASSIUM CHLORIDE ER 10 MEQ PO CPCR: 10.0000 meq | ORAL_CAPSULE | ORAL | 1 refills | Status: DC

## 2021-08-06 MED ORDER — FUROSEMIDE 20 MG PO TABS: 20.0000 mg | ORAL_TABLET | ORAL | 1 refills | Status: DC

## 2021-08-06 NOTE — Progress Notes (Signed)
PROVIDER NOTE: Interpretation of information contained herein should be left to medically-trained personnel. Specific patient instructions are provided elsewhere under "Patient Instructions" section of medical record. This document was created in part using STT-dictation technology, any transcriptional errors that may result from this process are unintentional.  Patient: Charles Ewing Type: Established DOB: [birth year] MRN: [id number] PCP: Olin Hauser, DO  Service: Procedure DOS: 08/09/2021 Setting: Ambulatory Location: Ambulatory outpatient facility Delivery: Face-to-face Provider: Gaspar Cola, MD Specialty: Interventional Pain Management Specialty designation: 09 Location: Outpatient facility Ref. Prov.: Milinda Pointer, MD    Primary Reason for Visit: Interventional Pain Management Treatment. CC: Back Pain (lower)    Procedure:          Anesthesia, Analgesia, Anxiolysis:  Procedure #1: Type: Medial Branch Facet Block #1 Primary Purpose: Diagnostic Region: Lumbar Level: L2, L3, L4, L5, & S1 Medial Branch Level(s) Target Area: For Lumbar Facet blocks, the target is the groove formed by the junction of the transverse process and superior articular process. For the L5 dorsal ramus, the target is the notch between superior articular process and sacral ala. For the S1 dorsal ramus, the target is the superior and lateral edge of the posterior S1 Sacral foramen. Approach: Posterior, paramedial, percutaneous approach. Laterality: Bilateral  Procedure #2: Type: Sacroiliac Joint Block  #1  Primary Purpose: Diagnostic Region: Posterior Lumbosacral Level: PSIS (Posterior Superior Iliac Spine) Sacroiliac Joint Target Area: For lower sacroiliac joint block(s), the target is the inferior and posterior margin of the sacroiliac joint. Approach: Ipsilateral approach. Laterality: Bilateral  Anesthesia: Local (1-2% Lidocaine)  Anxiolysis: None  Sedation: None  Guidance:  Fluoroscopy           Position: Prone   1. Lumbar facet syndrome   2. Chronic sacroiliac joint pain (Bilateral)   3. DDD (degenerative disc disease), lumbosacral   4. 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 Sacroiliac joint dysfunction (Bilateral)   7. Spondylosis without myelopathy or radiculopathy, lumbosacral region   8. Other spondylosis, sacral and sacrococcygeal region   9. Dextroscoliosis of lumbar spine    NAS-11 Pain score:   Pre-procedure: 10-Worst pain ever/10   Post-procedure: 2 /10     Pre-op H&P Assessment:  Charles Ewing is a 81 y.o. (year old), male patient, seen today for interventional treatment. He  has a past surgical history that includes Skin cancer excision; Colon polyp surgery; Transurethral resection of prostate (2010); Cataract extraction w/PHACO (Left, 05/09/2020); and Cataract extraction w/PHACO (Right, 05/23/2020). Charles Ewing has a current medication list which includes the following prescription(s): albuterol, amlodipine, aspirin ec, vitamin d3, ergocalciferol, fluoxetine, fluticasone, furosemide, gabapentin, onetouch ultrasoft, lorazepam, losartan, meloxicam, oxycodone, pantoprazole, potassium chloride, simvastatin, tamsulosin, medical compression stockings, metoprolol succinate, mupirocin ointment, nystatin, and sucralfate, and the following Facility-Administered Medications: lactated ringers and pentafluoroprop-tetrafluoroeth. His primarily concern today is the Back Pain (lower)  Initial Vital Signs:  Pulse/HCG Rate: (!) 124  Temp: (!) 97.2 F (36.2 C) Resp: 19 BP: (!) 84/61 SpO2: 100 %  BMI: Estimated body mass index is 27.2 kg/m as calculated from the following:   Height as of this encounter: '5\' 11"'$  (1.803 m).   Weight as of this encounter: 195 lb (88.5 kg).  Risk Assessment: Allergies: Reviewed. He is allergic to nexium [esomeprazole magnesium].  Allergy  Precautions: None required Coagulopathies: Reviewed. None identified.  Blood-thinner therapy: None at this time Active Infection(s): Reviewed. None identified. Charles Ewing is  afebrile  Site Confirmation: Charles Ewing was asked to confirm the procedure and laterality before marking the site Procedure checklist: Completed Consent: Before the procedure and under the influence of no sedative(s), amnesic(s), or anxiolytics, the patient was informed of the treatment options, risks and possible complications. To fulfill our ethical and legal obligations, as recommended by the American Medical Association's Code of Ethics, I have informed the patient of my clinical impression; the nature and purpose of the treatment or procedure; the risks, benefits, and possible complications of the intervention; the alternatives, including doing nothing; the risk(s) and benefit(s) of the alternative treatment(s) or procedure(s); and the risk(s) and benefit(s) of doing nothing. The patient was provided information about the general risks and possible complications associated with the procedure. These may include, but are not limited to: failure to achieve desired goals, infection, bleeding, organ or nerve damage, allergic reactions, paralysis, and death. In addition, the patient was informed of those risks and complications associated to Spine-related procedures, such as failure to decrease pain; infection (i.e.: Meningitis, epidural or intraspinal abscess); bleeding (i.e.: epidural hematoma, subarachnoid hemorrhage, or any other type of intraspinal or peri-dural bleeding); organ or nerve damage (i.e.: Any type of peripheral nerve, nerve root, or spinal cord injury) with subsequent damage to sensory, motor, and/or autonomic systems, resulting in permanent pain, numbness, and/or weakness of one or several areas of the body; allergic reactions; (i.e.: anaphylactic reaction); and/or death. Furthermore, the patient was informed of those  risks and complications associated with the medications. These include, but are not limited to: allergic reactions (i.e.: anaphylactic or anaphylactoid reaction(s)); adrenal axis suppression; blood sugar elevation that in diabetics may result in ketoacidosis or comma; water retention that in patients with history of congestive heart failure may result in shortness of breath, pulmonary edema, and decompensation with resultant heart failure; weight gain; swelling or edema; medication-induced neural toxicity; particulate matter embolism and blood vessel occlusion with resultant organ, and/or nervous system infarction; and/or aseptic necrosis of one or more joints. Finally, the patient was informed that Medicine is not an exact science; therefore, there is also the possibility of unforeseen or unpredictable risks and/or possible complications that may result in a catastrophic outcome. The patient indicated having understood very clearly. We have given the patient no guarantees and we have made no promises. Enough time was given to the patient to ask questions, all of which were answered to the patient's satisfaction. Charles Ewing has indicated that he wanted to continue with the procedure. Attestation: I, the ordering provider, attest that I have discussed with the patient the benefits, risks, side-effects, alternatives, likelihood of achieving goals, and potential problems during recovery for the procedure that I have provided informed consent. Date  Time: 08/09/2021 10:24 AM  Pre-Procedure Preparation:  Monitoring: As per clinic protocol. Respiration, ETCO2, SpO2, BP, heart rate and rhythm monitor placed and checked for adequate function Safety Precautions: Patient was assessed for positional comfort and pressure points before starting the procedure. Time-out: I initiated and conducted the "Time-out" before starting the procedure, as per protocol. The patient was asked to participate by confirming the accuracy of  the "Time Out" information. Verification of the correct person, site, and procedure were performed and confirmed by me, the nursing staff, and the patient. "Time-out" conducted as per Joint Commission's Universal Protocol (UP.01.01.01). Time: 1156  Description of Procedure #1:   Time-out: "Time-out" completed before starting procedure, as per protocol. Area Prepped: Entire Posterior Lumbosacral Region DuraPrep (Iodine Povacrylex [0.7% available iodine] and Isopropyl Alcohol,  74% w/w) Safety Precautions: Aspiration looking for blood return was conducted prior to all injections. At no point did we inject any substances, as a needle was being advanced. No attempts were made at seeking any paresthesias. Safe injection practices and needle disposal techniques used. Medications properly checked for expiration dates. SDV (single dose vial) medications used.  Description of the Procedure: Protocol guidelines were followed. The patient was placed in position over the fluoroscopy table. The target area was identified and the area prepped in the usual manner. Skin & deeper tissues infiltrated with local anesthetic. Appropriate amount of time allowed to pass for local anesthetics to take effect. The procedure needle was introduced through the skin, ipsilateral to the reported pain, and advanced to the target area. Employing the "Medial Branch Technique", the needles were advanced to the angle made by the superior and medial portion of the transverse process, and the lateral and inferior portion of the superior articulating process of the targeted vertebral bodies. This area is known as "Burton's Eye" or the "Eye of the [place] Dog". A procedure needle was introduced through the skin, and this time advanced to the angle made by the superior and medial border of the sacral ala, and the lateral border of the S1 vertebral body. This last needle was later repositioned at the superior and lateral border of the posterior S1  foramen. Negative aspiration confirmed. Solution injected in intermittent fashion, asking for systemic symptoms every 0.5cc of injectate. The needles were then removed and the area cleansed, making sure to leave some of the prepping solution back to take advantage of its long term bactericidal properties. Start Time: 1156 hrs. Materials:  Needle(s) Type: Spinal Needle Gauge: 22G Length: 3.5-in Medication(s): Please see orders for medications and dosing details.  Description of Procedure # 2:   Area Prepped: Entire Posterior Lumbosacral Region DuraPrep (Iodine Povacrylex [0.7% available iodine] and Isopropyl Alcohol, 74% w/w) Safety Precautions: Aspiration looking for blood return was conducted prior to all injections. At no point did we inject any substances, as a needle was being advanced. No attempts were made at seeking any paresthesias. Safe injection practices and needle disposal techniques used. Medications properly checked for expiration dates. SDV (single dose vial) medications used. Description of the Procedure: Protocol guidelines were followed. The patient was placed in position over the fluoroscopy table. The target area was identified and the area prepped in the usual manner. Skin & deeper tissues infiltrated with local anesthetic. Appropriate amount of time allowed to pass for local anesthetics to take effect. The procedure needle was advanced under fluoroscopic guidance into the sacroiliac joint until a firm endpoint was obtained. Proper needle placement secured. Negative aspiration confirmed. Solution injected in intermittent fashion, asking for systemic symptoms every 0.5cc of injectate. The needles were then removed and the area cleansed, making sure to leave some of the prepping solution back to take advantage of its long term bactericidal properties. Vitals:   08/09/21 1157 08/09/21 1202 08/09/21 1207 08/09/21 1215  BP: (!) 175/97 (!) 130/91 113/89 108/64  Pulse: 77 76 74 78   Resp: '12 13 10 15  '$ Temp:      SpO2: 100% 100% 100% 100%  Weight:      Height:        End Time:   hrs. Materials:  Needle(s) Type: Spinal Needle Gauge: 22G Length: 5.0-in Medication(s): Please see orders for medications and dosing details.  Imaging Guidance (Spinal):          Type of Imaging Technique:  Fluoroscopy Guidance (Spinal) Indication(s): Assistance in needle guidance and placement for procedures requiring needle placement in or near specific anatomical locations not easily accessible without such assistance. Exposure Time: Please see nurses notes. Contrast: None used. Fluoroscopic Guidance: I was personally present during the use of fluoroscopy. "Tunnel Vision Technique" used to obtain the best possible view of the target area. Parallax error corrected before commencing the procedure. "Direction-depth-direction" technique used to introduce the needle under continuous pulsed fluoroscopy. Once target was reached, antero-posterior, oblique, and lateral fluoroscopic projection used confirm needle placement in all planes. Images permanently stored in EMR. Interpretation: No contrast injected. I personally interpreted the imaging intraoperatively. Adequate needle placement confirmed in multiple planes. Permanent images saved into the patient's record.  Antibiotic Prophylaxis:   Anti-infectives (From admission, onward)    None      Indication(s): None identified  Post-operative Assessment:  Post-procedure Vital Signs:  Pulse/HCG Rate: 78  Temp: (!) 97.2 F (36.2 C) Resp: 15 BP: 108/64 SpO2: 100 %  EBL: None  Complications: No immediate post-treatment complications observed by team, or reported by patient.  Note: The patient tolerated the entire procedure well. A repeat set of vitals were taken after the procedure and the patient was kept under observation following institutional policy, for this type of procedure. Post-procedural neurological assessment was performed,  showing return to baseline, prior to discharge. The patient was provided with post-procedure discharge instructions, including a section on how to identify potential problems. Should any problems arise concerning this procedure, the patient was given instructions to immediately contact us, at any time, without hesitation. In any case, we plan to contact the patient by telephone for a follow-up status report regarding this interventional procedure.  Comments:  No additional relevant information.  Plan of Care  Orders:  Orders Placed This Encounter  Procedures   LUMBAR FACET(MEDIAL BRANCH NERVE BLOCK) MBNB    Scheduling Instructions:     Procedure: Lumbar facet block (AKA.: Lumbosacral medial branch nerve block)     Side: Bilateral     Level: L3-4 & L5-S1 Facets (L2, L3, L4, L5, & S1 Medial Branch Nerves)     Sedation: Patient's choice.     Timeframe: Today    Order Specific Question:   Where will this procedure be performed?    Answer:   [facility]   SACROILIAC JOINT INJECTION    Scheduling Instructions:     Side: Bilateral     Sedation: Patient's choice.     Timeframe: Today    Order Specific Question:   Where will this procedure be performed?    Answer:   [facility]   DG PAIN [facility] C-ARM 1-60 MIN NO REPORT    Intraoperative interpretation by procedural physician at [place].    Standing Status:   Standing    Number of Occurrences:   1    Order Specific Question:   Reason for exam:    Answer:   Assistance in needle guidance and placement for procedures requiring needle placement in or near specific anatomical locations not easily accessible without such assistance.   Informed Consent Details: Physician/Practitioner Attestation; Transcribe to consent form and obtain patient signature    Nursing Order: Transcribe to consent form and obtain patient signature. Note: Always confirm laterality of pain with Charles Ewing, before procedure.    Order Specific  Question:   Physician/Practitioner attestation of informed consent for procedure/surgical case    Answer:   I, the physician/practitioner, attest that I have discussed  with the patient the benefits, risks, side effects, alternatives, likelihood of achieving goals and potential problems during recovery for the procedure that I have provided informed consent.    Order Specific Question:   Procedure    Answer:   Lumbar Facet Block  under fluoroscopic guidance    Order Specific Question:   Physician/Practitioner performing the procedure    Answer:   Margot Oriordan A. Dossie Arbour MD    Order Specific Question:   Indication/Reason    Answer:   Low Back Pain, with our without leg pain, due to Facet Joint Arthralgia (Joint Pain) Spondylosis (Arthritis of the Spine), without myelopathy or radiculopathy (Nerve Damage).   Provide equipment / supplies at bedside    "Block Tray" (Disposable  single use) Needle type: SpinalSpinal Amount/quantity: 4 Size: Medium (5-inch) Gauge: 22G    Standing Status:   Standing    Number of Occurrences:   1    Order Specific Question:   Specify    Answer:   Block Tray   Informed Consent Details: Physician/Practitioner Attestation; Transcribe to consent form and obtain patient signature    Nursing Order: Transcribe to consent form and obtain patient signature. Note: Always confirm laterality of pain with Charles Ewing, before procedure.    Order Specific Question:   Physician/Practitioner attestation of informed consent for procedure/surgical case    Answer:   I, the physician/practitioner, attest that I have discussed with the patient the benefits, risks, side effects, alternatives, likelihood of achieving goals and potential problems during recovery for the procedure that I have provided informed consent.    Order Specific Question:   Procedure    Answer:   Sacroiliac Joint Block    Order Specific Question:   Physician/Practitioner performing the procedure    Answer:   Kona Yusuf A.  Dossie Arbour, MD    Order Specific Question:   Indication/Reason    Answer:   Chronic Low Back and Hip Pain secondary to Sacroiliac Joint Pain (Arthralgia/Arthropathy)   Chronic Opioid Analgesic:  Oxycodone IR 5 mg tablet, 1 tab p.o. 3 times daily (# 60) (last filled on 07/04/2021) MME/day: 22.5 mg/day   Medications ordered for procedure: Meds ordered this encounter  Medications   lidocaine (XYLOCAINE) 2 % (with pres) injection 400 mg   pentafluoroprop-tetrafluoroeth (GEBAUERS) aerosol   lactated ringers infusion   midazolam (VERSED) 5 MG/5ML injection 0.5-2 mg    Make sure Flumazenil is available in the pyxis when using this medication. If oversedation occurs, administer 0.2 mg IV over 15 sec. If after 45 sec no response, administer 0.2 mg again over 1 min; may repeat at 1 min intervals; not to exceed 4 doses (1 mg)   ropivacaine (PF) 2 mg/mL (0.2%) (NAROPIN) injection 18 mL   triamcinolone acetonide (KENALOG-40) injection 80 mg   ropivacaine (PF) 2 mg/mL (0.2%) (NAROPIN) injection 9 mL   methylPREDNISolone acetate (DEPO-MEDROL) injection 80 mg   Medications administered: We administered lidocaine, lactated ringers, midazolam, ropivacaine (PF) 2 mg/mL (0.2%), triamcinolone acetonide, ropivacaine (PF) 2 mg/mL (0.2%), and methylPREDNISolone acetate.  See the medical record for exact dosing, route, and time of administration.  Follow-up plan:   Return in about 2 weeks (around 08/23/2021) for Proc-day (T,Th), (F2F), (PPE).       Interventional Therapies  Risk  Complexity Considerations:   Estimated body mass index is 28.29 kg/m as calculated from the following:   Height as of this encounter: 5' 10.5" (1.791 m).   Weight as of this encounter: 200 lb (90.7 kg). History of:  abdominal aortic aneurysm (AAA); hypertension; cardiomyopathy; coronary artery disease; dissection of abdominal aorta; orthostatic hypotension; irregular heart rhythm; chronic kidney disease; shortness of breath; tobacco  abuse; COPD; GERD   Planned  Pending:   Pending further evaluation   Under consideration:   Diagnostic right lumbar facet MBB #1  Diagnostic bilateral SI block #1  Diagnostic right LESI #1  Diagnostic right TFESI #1    Completed:   None at this time   Completed by other providers:   Diagnostic/therapeutic bilateral IA steroid knee inj. (03/15/2016) by Carlynn Spry, PA Rosanne Gutting)    Therapeutic  Palliative (PRN) options:   None established     Recent Visits Date Type Provider Dept  07/23/21 Office Visit Milinda Pointer, MD [facility]  06/13/21 Office Visit Milinda Pointer, MD [facility]  Showing recent visits within past 90 days and meeting all other requirements Today's Visits Date Type Provider Dept  08/09/21 Procedure visit Milinda Pointer, MD [facility]  Showing today's visits and meeting all other requirements Future Appointments Date Type Provider Dept  08/23/21 Appointment Milinda Pointer, MD [facility]  08/27/21 Appointment Milinda Pointer, MD [facility]  Showing future appointments within next 90 days and meeting all other requirements  Disposition: Discharge home  Discharge (Date  Time): 08/09/2021; 1220 hrs.   Primary Care Physician: Olin Hauser, DO Location: [facility] Note by: Gaspar Cola, MD Date: 08/09/2021; Time: 12:21 PM  Disclaimer:  Medicine is not an Chief Strategy Officer. The only guarantee in medicine is that nothing is guaranteed. It is important to note that the decision to proceed with this intervention was based on the information collected from the patient. The Data and conclusions were drawn from the patient's questionnaire, the interview, and the physical examination. Because the information was provided in large part by the patient, it cannot be guaranteed that it has not been purposely or unconsciously manipulated. Every effort  has been made to obtain as much relevant data as possible for this evaluation. It is important to note that the conclusions that lead to this procedure are derived in large part from the available data. Always take into account that the treatment will also be dependent on availability of resources and existing treatment guidelines, considered by other Pain Management Practitioners as being common knowledge and practice, at the time of the intervention. For Medico-Legal purposes, it is also important to point out that variation in procedural techniques and pharmacological choices are the acceptable norm. The indications, contraindications, technique, and results of the above procedure should only be interpreted and judged by a Board-Certified Interventional Pain Specialist with extensive familiarity and expertise in the same exact procedure and technique.

## 2021-08-09 ENCOUNTER — Other Ambulatory Visit: Payer: Self-pay | Admitting: Family Medicine

## 2021-08-09 ENCOUNTER — Encounter: Payer: Self-pay | Admitting: Pain Medicine

## 2021-08-09 ENCOUNTER — Ambulatory Visit: Payer: Medicare Other | Attending: Pain Medicine | Admitting: Pain Medicine

## 2021-08-09 ENCOUNTER — Ambulatory Visit
Admission: RE | Admit: 2021-08-09 | Discharge: 2021-08-09 | Disposition: A | Discharge status: Home / Self Care | Payer: Medicare Other | Source: Ambulatory Visit | Attending: Pain Medicine | Admitting: Pain Medicine

## 2021-08-09 VITALS — BP 108/64 | HR 78 | Temp 97.2°F | Resp 15 | Ht 71.0 in | Wt 195.0 lb

## 2021-08-09 DIAGNOSIS — M47816 Spondylosis without myelopathy or radiculopathy, lumbar region: Secondary | ICD-10-CM | POA: Insufficient documentation

## 2021-08-09 DIAGNOSIS — M47898 Other spondylosis, sacral and sacrococcygeal region: Secondary | ICD-10-CM | POA: Insufficient documentation

## 2021-08-09 DIAGNOSIS — M47896 Other spondylosis, lumbar region: Secondary | ICD-10-CM | POA: Diagnosis not present

## 2021-08-09 DIAGNOSIS — M4186 Other forms of scoliosis, lumbar region: Secondary | ICD-10-CM | POA: Insufficient documentation

## 2021-08-09 DIAGNOSIS — Z7982 Long term (current) use of aspirin: Secondary | ICD-10-CM | POA: Diagnosis not present

## 2021-08-09 DIAGNOSIS — M8580 Other specified disorders of bone density and structure, unspecified site: Secondary | ICD-10-CM | POA: Insufficient documentation

## 2021-08-09 DIAGNOSIS — M545 Low back pain, unspecified: Secondary | ICD-10-CM | POA: Insufficient documentation

## 2021-08-09 DIAGNOSIS — Z6828 Body mass index (BMI) 28.0-28.9, adult: Secondary | ICD-10-CM | POA: Insufficient documentation

## 2021-08-09 DIAGNOSIS — Z791 Long term (current) use of non-steroidal anti-inflammatories (NSAID): Secondary | ICD-10-CM | POA: Diagnosis not present

## 2021-08-09 DIAGNOSIS — M5137 Other intervertebral disc degeneration, lumbosacral region: Secondary | ICD-10-CM | POA: Diagnosis not present

## 2021-08-09 DIAGNOSIS — I129 Hypertensive chronic kidney disease with stage 1 through stage 4 chronic kidney disease, or unspecified chronic kidney disease: Secondary | ICD-10-CM | POA: Insufficient documentation

## 2021-08-09 DIAGNOSIS — Z79899 Other long term (current) drug therapy: Secondary | ICD-10-CM | POA: Diagnosis not present

## 2021-08-09 DIAGNOSIS — M533 Sacrococcygeal disorders, not elsewhere classified: Secondary | ICD-10-CM | POA: Diagnosis not present

## 2021-08-09 DIAGNOSIS — J449 Chronic obstructive pulmonary disease, unspecified: Secondary | ICD-10-CM | POA: Insufficient documentation

## 2021-08-09 DIAGNOSIS — Z9841 Cataract extraction status, right eye: Secondary | ICD-10-CM | POA: Diagnosis not present

## 2021-08-09 DIAGNOSIS — I7102 Dissection of abdominal aorta: Secondary | ICD-10-CM | POA: Insufficient documentation

## 2021-08-09 DIAGNOSIS — M8588 Other specified disorders of bone density and structure, other site: Secondary | ICD-10-CM | POA: Insufficient documentation

## 2021-08-09 DIAGNOSIS — Z9889 Other specified postprocedural states: Secondary | ICD-10-CM | POA: Insufficient documentation

## 2021-08-09 DIAGNOSIS — G8929 Other chronic pain: Secondary | ICD-10-CM | POA: Insufficient documentation

## 2021-08-09 DIAGNOSIS — Z8679 Personal history of other diseases of the circulatory system: Secondary | ICD-10-CM | POA: Insufficient documentation

## 2021-08-09 DIAGNOSIS — M47817 Spondylosis without myelopathy or radiculopathy, lumbosacral region: Secondary | ICD-10-CM | POA: Diagnosis not present

## 2021-08-09 DIAGNOSIS — Z85828 Personal history of other malignant neoplasm of skin: Secondary | ICD-10-CM | POA: Insufficient documentation

## 2021-08-09 DIAGNOSIS — Z961 Presence of intraocular lens: Secondary | ICD-10-CM | POA: Insufficient documentation

## 2021-08-09 DIAGNOSIS — I499 Cardiac arrhythmia, unspecified: Secondary | ICD-10-CM | POA: Insufficient documentation

## 2021-08-09 DIAGNOSIS — Z9079 Acquired absence of other genital organ(s): Secondary | ICD-10-CM | POA: Diagnosis not present

## 2021-08-09 DIAGNOSIS — Z720 Tobacco use: Secondary | ICD-10-CM | POA: Insufficient documentation

## 2021-08-09 DIAGNOSIS — Z7951 Long term (current) use of inhaled steroids: Secondary | ICD-10-CM | POA: Insufficient documentation

## 2021-08-09 DIAGNOSIS — M461 Sacroiliitis, not elsewhere classified: Secondary | ICD-10-CM | POA: Diagnosis not present

## 2021-08-09 DIAGNOSIS — I429 Cardiomyopathy, unspecified: Secondary | ICD-10-CM | POA: Insufficient documentation

## 2021-08-09 DIAGNOSIS — Z79891 Long term (current) use of opiate analgesic: Secondary | ICD-10-CM | POA: Diagnosis not present

## 2021-08-09 DIAGNOSIS — I951 Orthostatic hypotension: Secondary | ICD-10-CM | POA: Insufficient documentation

## 2021-08-09 DIAGNOSIS — K219 Gastro-esophageal reflux disease without esophagitis: Secondary | ICD-10-CM | POA: Insufficient documentation

## 2021-08-09 DIAGNOSIS — N189 Chronic kidney disease, unspecified: Secondary | ICD-10-CM | POA: Insufficient documentation

## 2021-08-09 DIAGNOSIS — I2510 Atherosclerotic heart disease of native coronary artery without angina pectoris: Secondary | ICD-10-CM | POA: Diagnosis not present

## 2021-08-09 DIAGNOSIS — Z9842 Cataract extraction status, left eye: Secondary | ICD-10-CM | POA: Diagnosis not present

## 2021-08-09 DIAGNOSIS — R0602 Shortness of breath: Secondary | ICD-10-CM | POA: Insufficient documentation

## 2021-08-09 MED ORDER — ROPIVACAINE HCL 2 MG/ML IJ SOLN
18.0000 mL | Freq: Once | INTRAMUSCULAR | Status: AC
  Administered 2021-08-09: 18 mL via PERINEURAL
  Filled 2021-08-09: qty 20

## 2021-08-09 MED ORDER — MIDAZOLAM HCL 5 MG/5ML IJ SOLN
0.5000 mg | Freq: Once | INTRAMUSCULAR | Status: AC
  Administered 2021-08-09: 1 mg via INTRAVENOUS
  Filled 2021-08-09: qty 5

## 2021-08-09 MED ORDER — TRIAMCINOLONE ACETONIDE 40 MG/ML IJ SUSP
80.0000 mg | Freq: Once | INTRAMUSCULAR | Status: AC
  Administered 2021-08-09: 80 mg
  Filled 2021-08-09: qty 2

## 2021-08-09 MED ORDER — LIDOCAINE HCL 2 % IJ SOLN
20.0000 mL | Freq: Once | INTRAMUSCULAR | Status: AC
  Administered 2021-08-09: 400 mg
  Filled 2021-08-09: qty 20

## 2021-08-09 MED ORDER — METHYLPREDNISOLONE ACETATE 80 MG/ML IJ SUSP
80.0000 mg | Freq: Once | INTRAMUSCULAR | Status: AC
  Administered 2021-08-09: 80 mg via INTRA_ARTICULAR
  Filled 2021-08-09: qty 1

## 2021-08-09 MED ORDER — LACTATED RINGERS IV SOLN: Freq: Once | INTRAVENOUS | Status: AC

## 2021-08-09 MED ORDER — PENTAFLUOROPROP-TETRAFLUOROETH EX AERO: INHALATION_SPRAY | Freq: Once | CUTANEOUS | Status: DC

## 2021-08-09 MED ORDER — ROPIVACAINE HCL 2 MG/ML IJ SOLN
9.0000 mL | Freq: Once | INTRAMUSCULAR | Status: AC
  Administered 2021-08-09: 9 mL via INTRA_ARTICULAR
  Filled 2021-08-09: qty 20

## 2021-08-09 NOTE — Patient Instructions (Signed)

## 2021-08-09 NOTE — Telephone Encounter (Signed)
Requested medication (s) are due for refill today -expired Rx  Requested medication (s) are on the active medication list -yes  Future visit scheduled -no  Last refill: 05/16/17  Notes to clinic: historical medication  Requested Prescriptions  Pending Prescriptions Disp Refills   amLODipine (NORVASC) 5 MG tablet [Pharmacy Med Name: AMLODIPINE BESYLATE 5 MG TAB] 30 tablet     Sig: TAKE 1 TABLET BY MOUTH DAILY     Cardiovascular: Calcium Channel Blockers 2 Passed - 08/09/2021  1:11 PM      Passed - Last BP in normal range    BP Readings from Last 1 Encounters:  08/09/21 108/64         Passed - Last Heart Rate in normal range    Pulse Readings from Last 1 Encounters:  08/09/21 78         Passed - Valid encounter within last 6 months    Recent Outpatient Visits           3 days ago Benign hypertension with CKD (chronic kidney disease) stage III ([place])   [facility] [place], Devonne Doughty, DO   5 months ago Chronic right-sided low back pain with bilateral sciatica   [place], DO   7 months ago Acute intractable headache, unspecified headache type   [facility] [place], Coralie Keens, NP   9 months ago Annual physical exam   [facility] Olin Hauser, DO   11 months ago Bilateral lower extremity edema   [place], DO                 Requested Prescriptions  Pending Prescriptions Disp Refills   amLODipine ([place]) 5 MG tablet [Pharmacy Med Name: AMLODIPINE BESYLATE 5 MG TAB] 30 tablet     Sig: TAKE 1 TABLET BY MOUTH DAILY     Cardiovascular: Calcium Channel Blockers 2 Passed - 08/09/2021  1:11 PM      Passed - Last BP in normal range    BP Readings from Last 1 Encounters:  08/09/21 108/64         Passed - Last Heart Rate in normal range    Pulse Readings from Last 1 Encounters:  08/09/21 78         Passed - Valid  encounter within last 6 months    Recent Outpatient Visits           3 days ago Benign hypertension with CKD (chronic kidney disease) stage III [facility])   [facility] [place], Devonne Doughty, DO   5 months ago Chronic right-sided low back pain with bilateral sciatica   [place], DO   7 months ago Acute intractable headache, unspecified headache type   [facility] [place], Coralie Keens, NP   9 months ago Annual physical exam   [place], DO   11 months ago Bilateral lower extremity edema   [place], Devonne Doughty, [place]

## 2021-08-09 NOTE — Progress Notes (Signed)
Safety precautions to be maintained throughout the outpatient stay will include: orient to surroundings, keep bed in low position, maintain call bell within reach at all times, provide assistance with transfer out of bed and ambulation.  

## 2021-08-10 ENCOUNTER — Telehealth: Payer: Self-pay

## 2021-08-10 NOTE — Chronic Care Management (AMB) (Signed)
  Chronic Care Management   Outreach Note  08/10/2021 Name: Charles Ewing MRN: [id number] DOB: May 19, 1940  Charles Ewing is a 81 y.o. year old male who is a primary care patient of Olin Hauser, DO. I reached out to Liana Gerold by phone today in response to a referral sent by [name]'s primary care provider.  An unsuccessful telephone outreach was attempted today. The patient was referred to the case management team for assistance with care management and care coordination.   Follow Up Plan: The care management team will reach out to the patient again over the next 5 days.  If patient returns call to provider office, please advise to call [place] * at [phone]*  Noreene Larsson, [place], [place] Management  [place], [place] [address] Direct Dial: [phone] [email or web address] Website: [email or web address]

## 2021-08-10 NOTE — Chronic Care Management (AMB) (Signed)
  Chronic Care Management   Note  08/10/2021 Name: DANNIEL TONES MRN: [id number] DOB: Oct 04, 1940  TAGE FEGGINS is a 81 y.o. year old male who is a primary care patient of Olin Hauser, DO. I reached out to Liana Gerold by phone today in response to a referral sent by Mr. [place] PCP.  Mr. Soules was given information about Chronic Care Management services today including:  CCM service includes personalized support from designated clinical staff supervised by his physician, including individualized plan of care and coordination with other care providers 24/7 contact phone numbers for assistance for urgent and routine care needs. Service will only be billed when office clinical staff spend 20 minutes or more in a month to coordinate care. Only one practitioner may furnish and bill the service in a calendar month. The patient may stop CCM services at any time (effective at the end of the month) by phone call to the office staff. The patient is responsible for co-pay (up to 20% after annual deductible is met) if co-pay is required by the individual health plan.   Patient agreed to services and verbal consent obtained.   Follow up plan: Telephone appointment with care management team member scheduled for: [facility] 08/13/2021 Pharm d 08/27/2021  Noreene Larsson, [place], [place], [place] [address] Direct Dial: [phone] [email or web address] Website: [email or web address]

## 2021-08-10 NOTE — Telephone Encounter (Signed)
Post procedure phone call.  Voicemail has not been set up.

## 2021-08-13 ENCOUNTER — Telehealth: Payer: Self-pay

## 2021-08-13 ENCOUNTER — Telehealth: Payer: Medicaid Other

## 2021-08-13 NOTE — Telephone Encounter (Signed)
  Care Management   Follow Up Note   08/13/2021 Name: Charles Ewing MRN: [id number] DOB: 18-Jan-1941   Referred by: Olin Hauser, DO Reason for referral : Chronic Care Management (RNCM: Initial Outreach for Chronic Disease Management and Care Coordination Needs, unable to reach patient)   An unsuccessful telephone outreach was attempted today. The patient was referred to the case management team for assistance with care management and care coordination.   Follow Up Plan: The care management team will reach out to the patient again over the next 30 days.   Noreene Larsson RN, MSN, [place] Mobile: [phone]

## 2021-08-16 ENCOUNTER — Ambulatory Visit (INDEPENDENT_AMBULATORY_CARE_PROVIDER_SITE_OTHER): Payer: Medicare Other

## 2021-08-16 ENCOUNTER — Telehealth: Payer: Medicaid Other

## 2021-08-16 DIAGNOSIS — F32A Depression, unspecified: Secondary | ICD-10-CM

## 2021-08-16 DIAGNOSIS — G8929 Other chronic pain: Secondary | ICD-10-CM

## 2021-08-16 DIAGNOSIS — I2510 Atherosclerotic heart disease of native coronary artery without angina pectoris: Secondary | ICD-10-CM

## 2021-08-16 DIAGNOSIS — J432 Centrilobular emphysema: Secondary | ICD-10-CM

## 2021-08-16 DIAGNOSIS — G894 Chronic pain syndrome: Secondary | ICD-10-CM

## 2021-08-16 DIAGNOSIS — E785 Hyperlipidemia, unspecified: Secondary | ICD-10-CM

## 2021-08-16 DIAGNOSIS — F419 Anxiety disorder, unspecified: Secondary | ICD-10-CM

## 2021-08-16 NOTE — Patient Instructions (Signed)
Visit Information   Thank you for taking time to visit with me today. Please don't hesitate to contact me if I can be of assistance to you before our next scheduled telephone appointment.  Following are the goals we discussed today:  (Copy and paste patient goals from clinical care plan here)  Our next appointment is by telephone on 09-20-2021 at 1015 am  Please call the care guide team at [phone] if you need to cancel or reschedule your appointment.   If you are experiencing a Mental Health or [place] or need someone to talk to, please call the Suicide and Crisis Lifeline: 988 call the [place] National Suicide Prevention Lifeline: 412-217-3109 or TTY: (782) 481-2475 TTY 712-478-7606) to talk to a trained counselor call 1-800-273-TALK (toll free, 24 hour hotline)   Following is a copy of your full care plan:  Care Plan : RNCM: General Plan of Care (Adult) for Chronic Disease Management and Care Coordination Needs  Updates made by Vanita Ingles, RN since 08/16/2021 12:00 AM     Problem: RNCM: Development of plan of care for Chronic Disease Management (HLD, CAD, COPD, Anxiety, Depression, Chronic Pain)   Priority: High     Long-Range Goal: RNCM: Effective Management of plan of care for Chronic Disease Management (HLD, CAD, COPD, Anxiety, Depression, Chronic Pain)   Start Date: 08/16/2021  Expected End Date: 08/17/2022  Priority: High  Note:   Current Barriers:  Knowledge Deficits related to plan of care for management of CAD, HLD, COPD, Chronic Pain, and anxiety and depression  Care Coordination needs related to Mental Health Concerns   Chronic Disease Management support and education needs related to CAD, HLD, COPD, Chronic Pain, and Anxiety and depression Lacks caregiver support.         RNCM Clinical Goal(s):  Patient will verbalize basic understanding of CAD, HLD, COPD, Anxiety, Depression, and Chronic pain  disease process and self health management plan as  evidenced by routine visits with pcp and specialist, following plan of care, working with the CCM team to effectively manage health and well being, VS stable, and compliance with dietary restrictions and medications management. take all medications exactly as prescribed and will call provider for medication related questions as evidenced by compliance with medications and calling for refills before running out of medications    attend all scheduled medical appointments: with pcp and specialist as evidenced by keeping appointments and calling for schedule change needs        work with pharmacist to address medication barriers and how to make sure he is effectively taking medications as prescribed related to CAD, HLD, COPD, Anxiety, Depression, and Chronic pain  as evidenced by review of EMR and patient or pharmacist report    demonstrate ongoing self health care management ability for effective management of Chronic conditions as evidenced by working with the CCM team through collaboration with Consulting civil engineer, provider, and care team.   Interventions: 1:1 collaboration with primary care provider regarding development and update of comprehensive plan of care as evidenced by provider attestation and co-signature Inter-disciplinary care team collaboration (see longitudinal plan of care) Evaluation of current treatment plan related to  self management and patient's adherence to plan as established by provider   CAD  (Status: New goal.) Long Term Goal   BP Readings from Last 3 Encounters:  08/09/21 108/64  08/06/21 (!) 94/56  08/06/21 (!) 94/56    Lab Results  Component Value Date   CHOL 96 10/30/2020  HDL 40 10/30/2020   LDLCALC 39 10/30/2020   TRIG 87 10/30/2020   CHOLHDL 2.4 10/30/2020    Assessed understanding of CAD diagnosis Medications reviewed including medications utilized in CAD treatment plan Assessed understanding of CAD diagnosis Medications reviewed including medications  utilized in CAD treatment plan Provided education on importance of blood pressure control in management of CAD; Provided education on Importance of limiting foods high in cholesterol; Counseled on importance of regular laboratory monitoring as prescribed; Counseled on the importance of exercise goals with target of 150 minutes per week Reviewed Importance of taking all medications as prescribed Reviewed Importance of attending all scheduled provider appointments Advised to report any changes in symptoms or exercise tolerance Advised patient to discuss changes in CAD and heart health with provider;  COPD: (Status: New goal.) Long Term Goal  Reviewed medications with patient, including use of prescribed maintenance and rescue inhalers, and provided instruction on medication management and the importance of adherence. 08-16-2021: The patient states that he takes his medications as directed and knows how to use his inhalers for effective management of COPD Provided patient with basic written and verbal COPD education on self care/management/and exacerbation prevention Advised patient to track and manage COPD triggers. 08-16-2021: The patient knows the weather changes really impact his breathing in a negative way. He tries to stay in when it is really hot outside and knows this can cause him to have a bad cough and trouble breathing.  Provided written and verbal instructions on pursed lip breathing and utilized returned demonstration as teach back Provided instruction about proper use of medications used for management of COPD including inhalers Advised patient to self assesses COPD action plan zone and make appointment with provider if in the yellow zone for 48 hours without improvement Advised patient to engage in light exercise as tolerated 3-5 days a week to aid in the the management of COPD Provided education about and advised patient to utilize infection prevention strategies to reduce risk of  respiratory infection Discussed the importance of adequate rest and management of fatigue with COPD Screening for signs and symptoms of depression related to chronic disease state  Assessed social determinant of health barriers  Anxiety and Depression  (Status: New goal.) Long Term Goal  Evaluation of current treatment plan related to Anxiety and Depression, Mental Health Concerns  self-management and patient's adherence to plan as established by provider. Discussed plans with patient for ongoing care management follow up and provided patient with direct contact information for care management team Advised patient to call the office for changes in his mood, anxiety, depression, or mental health. The patient states since his pain is better managed his attitude is better; Provided education to patient re: support system and working with the CCM team to optimize health and well being and be a support to the patient with management of his chronic conditions and mental health needs; Reviewed medications with patient and discussed compliance. The patient admits that he has not always taken his medications correctly but he is open to recommendations and help from the CCM team; Provided patient with number to reach [facility], services available, support system and resources available educational materials related to effective management of mental health and well being- depression and anxiety; Discussed plans with patient for ongoing care management follow up and provided patient with direct contact information for care management team; Advised patient to discuss changes in his mental health and well being  with provider; Screening for signs and symptoms of depression  related to chronic disease state;  Assessed social determinant of health barriers;   Hyperlipidemia:  (Status: New goal.) Long Term Goal  Lab Results  Component Value Date   CHOL 96 10/30/2020   HDL 40 10/30/2020   LDLCALC 39 10/30/2020   TRIG  87 10/30/2020   CHOLHDL 2.4 10/30/2020     Medication review performed; medication list updated in electronic medical record.  Provider established cholesterol goals reviewed; Counseled on importance of regular laboratory monitoring as prescribed; Provided HLD educational materials; Reviewed role and benefits of statin for ASCVD risk reduction; Discussed strategies to manage statin-induced myalgias; Reviewed importance of limiting foods high in cholesterol; Reviewed exercise goals and target of 150 minutes per week;  Pain:  (Status: New goal.) Long Term Goal  Pain assessment performed. 08-16-2021: The patient is rating his pain level today at a 4 on a scale of 0-10. The patient states it is much better now since the pcp referred him to the pain clinic. Medications reviewed. 08-16-2021: He states that they "loaded" him up with good medications at the pain clinic and he can tell a positive difference in his pain. He says he mainly has back pain but it can move around. He states that his pain is a lot better since going tot he pain specialist. He will follow up again with the pain specialist next week. Education and support given.  Reviewed provider established plan for pain management. 08-16-2021: The patient is seeing the pain specialist now and can tell a positive difference in his pain level Discussed importance of adherence to all scheduled medical appointments. 08-16-2021: The patient sees the pain specialist on 7-13 and 7-17. Knows to call the pcp for new changes. Counseled on the importance of reporting any/all new or changed pain symptoms or management strategies to pain management provider; Advised patient to report to care team affect of pain on daily activities; Discussed use of relaxation techniques and/or diversional activities to assist with pain reduction (distraction, imagery, relaxation, massage, acupressure, TENS, heat, and cold application; Reviewed with patient prescribed  pharmacological and nonpharmacological pain relief strategies; Advised patient to discuss changes in level and intensity of pain or unresolved pain  with provider;  Patient Goals/Self-Care Activities: Take medications as prescribed   Attend all scheduled provider appointments Call pharmacy for medication refills 3-7 days in advance of running out of medications Attend church or other social activities Perform all self care activities independently  Perform IADL's (shopping, preparing meals, housekeeping, managing finances) independently Call provider office for new concerns or questions  Work with the social worker to address care coordination needs and will continue to work with the clinical team to address health care and disease management related needs call the Suicide and Crisis Lifeline: 988 call the [place] National Suicide Prevention Lifeline: (984) 491-7690 or TTY: 3043941594 TTY 616-701-5557) to talk to a trained counselor call 1-800-273-TALK (toll free, 24 hour hotline) if experiencing a Mental Health or [place]  avoid second hand smoke eliminate smoking in my home identify and avoid work-related triggers identify and remove indoor air pollutants limit outdoor activity during cold weather listen for public air quality announcements every day develop a rescue plan eliminate symptom triggers at home follow rescue plan if symptoms flare-up use an extra pillow to sleep develop a new routine to improve sleep don't eat or exercise right before bedtime eat healthy/prescribed diet: heart healthy diet get at least 7 to 8 hours of sleep at night use devices that will help like a  cane, sock-puller or reacher do exercises in a comfortable position that makes breathing as easy as possible - call for medicine refill 2 or 3 days before it runs out - take all medications exactly as prescribed - call doctor with any symptoms you believe are related to your medicine - call  doctor when you experience any new symptoms - go to all doctor appointments as scheduled       Consent to CCM Services: Charles Ewing was given information about Chronic Care Management services including:  CCM service includes personalized support from designated clinical staff supervised by his physician, including individualized plan of care and coordination with other care providers 24/7 contact phone numbers for assistance for urgent and routine care needs. Service will only be billed when office clinical staff spend 20 minutes or more in a month to coordinate care. Only one practitioner may furnish and bill the service in a calendar month. The patient may stop CCM services at any time (effective at the end of the month) by phone call to the office staff. The patient will be responsible for cost sharing (co-pay) of up to 20% of the service fee (after annual deductible is met).  Patient agreed to services and verbal consent obtained.   The patient verbalized understanding of instructions, educational materials, and care plan provided today and DECLINED offer to receive copy of patient instructions, educational materials, and care plan.   Telephone follow up appointment with care management team member scheduled for: 09-20-2021 at 1015 am  Noreene Larsson RN, MSN, [place] Mobile: [phone]

## 2021-08-16 NOTE — Chronic Care Management (AMB) (Signed)
Chronic Care Management   CCM RN Visit Note  08/16/2021 Name: Charles Ewing MRN: [id number] DOB: 06/01/1940  Subjective: Charles Ewing is a 81 y.o. year old male who is a primary care patient of Olin Hauser, DO. The care management team was consulted for assistance with disease management and care coordination needs.    Engaged with patient by telephone for follow up visit in response to provider referral for case management and/or care coordination services.   Consent to Services:  The patient was given information about Chronic Care Management services, agreed to services, and gave verbal consent prior to initiation of services.  Please see initial visit note for detailed documentation.   Patient agreed to services and verbal consent obtained.   Assessment: Review of patient past medical history, allergies, medications, health status, including review of consultants reports, laboratory and other test data, was performed as part of comprehensive evaluation and provision of chronic care management services.   SDOH (Social Determinants of Health) assessments and interventions performed:    CCM Care Plan  Allergies  Allergen Reactions   Nexium [Esomeprazole Magnesium]     Outpatient Encounter Medications as of 08/16/2021  Medication Sig Note   albuterol (VENTOLIN HFA) 108 (90 Base) MCG/ACT inhaler INHALE 2 PUFFS INTO THE LUNGS EVERY 6 HOURS AS NEEDED FOR WHEEZING OR SHORTNESS OF BREATH.    amLODipine (NORVASC) 5 MG tablet TAKE 1 TABLET BY MOUTH DAILY    aspirin EC 81 MG tablet Take 81 mg by mouth every evening. 06/13/2021: Patient takes  16.2 mg   Cholecalciferol (VITAMIN D3) 125 MCG (5000 UT) CAPS Take 1 capsule (5,000 Units total) by mouth daily with breakfast. Take along with calcium and magnesium.    ergocalciferol (VITAMIN D2) 1.25 MG (50000 UT) capsule Take 1 capsule (50,000 Units total) by mouth once a week. X 6 weeks.    FLUoxetine (PROZAC) 20 MG capsule Take 1  capsule (20 mg total) by mouth 2 (two) times daily.    fluticasone (FLONASE) 50 MCG/ACT nasal spray PLACE 2 SPRAYS INTO BOTH NOSTRILS DAILY    furosemide (LASIX) 20 MG tablet Take 1 tablet (20 mg total) by mouth every other day.    gabapentin (NEURONTIN) 100 MG capsule START 1 CAPSULE BY MOUTH DAILY, INCREASEBY 1 CAPSULE EVERY 2 TO 3 DAYS AS TOLERATED UP TO 3 TIMES A DAY, OR MAY TAKE 3 AT ONCE IN THE EVENING    Lancets (ONETOUCH ULTRASOFT) lancets Use as instructed    LORazepam (ATIVAN) 0.5 MG tablet TAKE 1 TABLET BY MOUTH AT BEDTIME AS NEEDED FOR ANXIETY    losartan (COZAAR) 25 MG tablet TAKE 1 TABLET BY MOUTH DAILY    meloxicam (MOBIC) 15 MG tablet TAKE 1 TABLET BY MOUTH DAILY AS NEEDED FOR PAIN    oxyCODONE (OXY IR/ROXICODONE) 5 MG immediate release tablet Take 1 tablet (5 mg total) by mouth 2 (two) times daily as needed for severe pain. Each refill must last 30 days. 07/23/2021: WARNING: Not a Duplicate. Future prescription. DO NOT DELETE during hospital medication reconciliation or at discharge. [facility] Patient    pantoprazole (PROTONIX) 20 MG tablet Take 1 tablet by mouth daily.    potassium chloride (MICRO-K) 10 MEQ CR capsule Take 1 capsule (10 mEq total) by mouth every other day.    simvastatin (ZOCOR) 40 MG tablet Take 1 tablet (40 mg total) by mouth at bedtime.    tamsulosin (FLOMAX) 0.4 MG CAPS capsule TAKE 1 CAPSULE BY MOUTH DAILY  AFTER BREAKFAST    No facility-administered encounter medications on file as of 08/16/2021.    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) 07/23/2021   Osteoarthritis of hips (Bilateral) 07/23/2021   Vitamin D deficiency 07/23/2021   Vitamin B12 deficiency 07/23/2021   Long term prescription benzodiazepine use 06/13/2021   Chronic use of opiate for therapeutic purpose 06/13/2021   Long term current use of non-steroidal anti-inflammatories (NSAID) 06/13/2021   Chronic hip pain (2ry area of Pain) (Right) 06/13/2021   Chronic lower extremity pain (3ry area of Pain) (Right) 06/13/2021   Chronic cardiopulmonary disease (HCC) 06/13/2021   Chronic low back pain (1ry area of Pain) (Bilateral) (R>L) w/o sciatica 06/13/2021   Lumbar facet syndrome 06/13/2021   Depressive disorder 06/12/2021   Chronic pain syndrome 06/12/2021   Pharmacologic therapy 06/12/2021   Disorder of skeletal system 06/12/2021   Problems influencing health status 06/12/2021   Seborrheic keratosis 05/24/2021   Actinic keratosis 05/24/2021   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[place]) 05/24/2021   Basal cell carcinoma of nose 05/24/2021   Disorder of eustachian tube 05/24/2021   Esophageal reflux 05/24/2021   History of colonic polyps 05/24/2021   History of malignant neoplasm of skin 05/24/2021   Hypertensive heart disease without congestive heart failure 05/24/2021   Neoplasm of uncertain behavior of skin 05/24/2021   Other ill-defined and unknown causes of morbidity and mortality 05/24/2021   Other specified counseling 05/24/2021   Reason for consultation 05/24/2021   Tobacco use disorder 05/24/2021   Verruca vulgaris 05/24/2021   Atypical chest pain 05/24/2021   Panic attack 05/24/2021   Panic disorder 05/24/2021   Depression    Hypertension    Nicotine dependence    Cardiomyopathy ([place])    PTSD (post-traumatic stress disorder) 11/06/2020   Venous insufficiency of both lower extremities 09/04/2020   Bilateral lower extremity edema 09/04/2020    Irregular heart rhythm 08/01/2016   Slow transit constipation 04/17/2016   Anxiety 04/17/2016   Insomnia 04/17/2016   Osteoarthritis of knees (Bilateral) 03/22/2016   SOB (shortness of breath) on exertion 12/13/2015   Left sided chest pain 12/11/2015   Coronary artery disease    Benign hypertension with CKD (chronic kidney disease) stage III ([place]) 11/17/2015   Abdominal aortic aneurysm (AAA) ([place]) 06/12/2015   Prediabetes 05/15/2015   Hyperlipidemia 05/15/2015   Centrilobular emphysema ([place]) 05/15/2015   Orthostatic hypotension 02/28/2015   Elevated prostate specific antigen (PSA) 04/14/2012   Benign prostatic hyperplasia with urinary obstruction 04/14/2012   False passage of urethra 04/14/2012    Conditions to be addressed/monitored:CAD, HLD, COPD, Anxiety, Depression, and Chronic pain   Care Plan : RNCM: General Plan of Care (Adult) for Chronic Disease Management and Care Coordination Needs  Updates made by Vanita Ingles, RN since 08/16/2021 12:00 AM     Problem: RNCM: Development of plan of care for Chronic Disease Management (HLD, CAD, COPD, Anxiety, Depression, Chronic Pain)   Priority: High     Long-Range Goal: RNCM: Effective Management of plan of care for Chronic Disease Management (HLD, CAD, COPD, Anxiety, Depression,  Chronic Pain)   Start Date: 08/16/2021  Expected End Date: 08/17/2022  Priority: High  Note:   Current Barriers:  Knowledge Deficits related to plan of care for management of CAD, HLD, COPD, Chronic Pain, and anxiety and depression  Care Coordination needs related to Mental Health Concerns   Chronic Disease Management support and education needs related to CAD, HLD, COPD, Chronic Pain, and Anxiety and depression Lacks caregiver support.         RNCM Clinical Goal(s):  Patient will verbalize basic understanding of CAD, HLD, COPD, Anxiety, Depression, and Chronic pain  disease process and self health management plan as evidenced by routine visits with pcp and  specialist, following plan of care, working with the CCM team to effectively manage health and well being, VS stable, and compliance with dietary restrictions and medications management. take all medications exactly as prescribed and will call provider for medication related questions as evidenced by compliance with medications and calling for refills before running out of medications    attend all scheduled medical appointments: with pcp and specialist as evidenced by keeping appointments and calling for schedule change needs        work with pharmacist to address medication barriers and how to make sure he is effectively taking medications as prescribed related to CAD, HLD, COPD, Anxiety, Depression, and Chronic pain  as evidenced by review of EMR and patient or pharmacist report    demonstrate ongoing self health care management ability for effective management of Chronic conditions as evidenced by working with the CCM team through collaboration with Consulting civil engineer, provider, and care team.   Interventions: 1:1 collaboration with primary care provider regarding development and update of comprehensive plan of care as evidenced by provider attestation and co-signature Inter-disciplinary care team collaboration (see longitudinal plan of care) Evaluation of current treatment plan related to  self management and patient's adherence to plan as established by provider   CAD  (Status: New goal.) Long Term Goal   BP Readings from Last 3 Encounters:  08/09/21 108/64  08/06/21 (!) 94/56  08/06/21 (!) 94/56    Lab Results  Component Value Date   CHOL 96 10/30/2020   HDL 40 10/30/2020   [place] 39 10/30/2020   TRIG 87 10/30/2020   CHOLHDL 2.4 10/30/2020    Assessed understanding of CAD diagnosis Medications reviewed including medications utilized in CAD treatment plan Assessed understanding of CAD diagnosis Medications reviewed including medications utilized in CAD treatment plan Provided  education on importance of blood pressure control in management of CAD; Provided education on Importance of limiting foods high in cholesterol; Counseled on importance of regular laboratory monitoring as prescribed; Counseled on the importance of exercise goals with target of 150 minutes per week Reviewed Importance of taking all medications as prescribed Reviewed Importance of attending all scheduled provider appointments Advised to report any changes in symptoms or exercise tolerance Advised patient to discuss changes in CAD and heart health with provider;  COPD: (Status: New goal.) Long Term Goal  Reviewed medications with patient, including use of prescribed maintenance and rescue inhalers, and provided instruction on medication management and the importance of adherence. 08-16-2021: The patient states that he takes his medications as directed and knows how to use his inhalers for effective management of COPD Provided patient with basic written and verbal COPD education on self care/management/and exacerbation prevention Advised patient to track and manage COPD triggers. 08-16-2021: The patient knows the weather changes really impact his breathing in a negative way.  He tries to stay in when it is really hot outside and knows this can cause him to have a bad cough and trouble breathing.  Provided written and verbal instructions on pursed lip breathing and utilized returned demonstration as teach back Provided instruction about proper use of medications used for management of COPD including inhalers Advised patient to self assesses COPD action plan zone and make appointment with provider if in the yellow zone for 48 hours without improvement Advised patient to engage in light exercise as tolerated 3-5 days a week to aid in the the management of COPD Provided education about and advised patient to utilize infection prevention strategies to reduce risk of respiratory infection Discussed the importance  of adequate rest and management of fatigue with COPD Screening for signs and symptoms of depression related to chronic disease state  Assessed social determinant of health barriers  Anxiety and Depression  (Status: New goal.) Long Term Goal  Evaluation of current treatment plan related to Anxiety and Depression, Mental Health Concerns  self-management and patient's adherence to plan as established by provider. Discussed plans with patient for ongoing care management follow up and provided patient with direct contact information for care management team Advised patient to call the office for changes in his mood, anxiety, depression, or mental health. The patient states since his pain is better managed his attitude is better; Provided education to patient re: support system and working with the CCM team to optimize health and well being and be a support to the patient with management of his chronic conditions and mental health needs; Reviewed medications with patient and discussed compliance. The patient admits that he has not always taken his medications correctly but he is open to recommendations and help from the CCM team; Provided patient with number to reach [facility], services available, support system and resources available educational materials related to effective management of mental health and well being- depression and anxiety; Discussed plans with patient for ongoing care management follow up and provided patient with direct contact information for care management team; Advised patient to discuss changes in his mental health and well being  with provider; Screening for signs and symptoms of depression related to chronic disease state;  Assessed social determinant of health barriers;   Hyperlipidemia:  (Status: New goal.) Long Term Goal  Lab Results  Component Value Date   CHOL 96 10/30/2020   HDL 40 10/30/2020   LDLCALC 39 10/30/2020   TRIG 87 10/30/2020   CHOLHDL 2.4 10/30/2020      Medication review performed; medication list updated in electronic medical record.  Provider established cholesterol goals reviewed; Counseled on importance of regular laboratory monitoring as prescribed; Provided HLD educational materials; Reviewed role and benefits of statin for ASCVD risk reduction; Discussed strategies to manage statin-induced myalgias; Reviewed importance of limiting foods high in cholesterol; Reviewed exercise goals and target of 150 minutes per week;  Pain:  (Status: New goal.) Long Term Goal  Pain assessment performed. 08-16-2021: The patient is rating his pain level today at a 4 on a scale of 0-10. The patient states it is much better now since the pcp referred him to the pain clinic. Medications reviewed. 08-16-2021: He states that they "loaded" him up with good medications at the pain clinic and he can tell a positive difference in his pain. He says he mainly has back pain but it can move around. He states that his pain is a lot better since going tot he pain specialist. He will  follow up again with the pain specialist next week. Education and support given.  Reviewed provider established plan for pain management. 08-16-2021: The patient is seeing the pain specialist now and can tell a positive difference in his pain level Discussed importance of adherence to all scheduled medical appointments. 08-16-2021: The patient sees the pain specialist on 7-13 and 7-17. Knows to call the pcp for new changes. Counseled on the importance of reporting any/all new or changed pain symptoms or management strategies to pain management provider; Advised patient to report to care team affect of pain on daily activities; Discussed use of relaxation techniques and/or diversional activities to assist with pain reduction (distraction, imagery, relaxation, massage, acupressure, TENS, heat, and cold application; Reviewed with patient prescribed pharmacological and nonpharmacological pain relief  strategies; Advised patient to discuss changes in level and intensity of pain or unresolved pain  with provider;  Patient Goals/Self-Care Activities: Take medications as prescribed   Attend all scheduled provider appointments Call pharmacy for medication refills 3-7 days in advance of running out of medications Attend church or other social activities Perform all self care activities independently  Perform IADL's (shopping, preparing meals, housekeeping, managing finances) independently Call provider office for new concerns or questions  Work with the social worker to address care coordination needs and will continue to work with the clinical team to address health care and disease management related needs call the Suicide and Crisis Lifeline: 988 call the [place] National Suicide Prevention Lifeline: 319-814-7523 or TTY: 3325728512 TTY 416 643 1864) to talk to a trained counselor call 1-800-273-TALK (toll free, 24 hour hotline) if experiencing a Mental Health or [place]  avoid second hand smoke eliminate smoking in my home identify and avoid work-related triggers identify and remove indoor air pollutants limit outdoor activity during cold weather listen for public air quality announcements every day develop a rescue plan eliminate symptom triggers at home follow rescue plan if symptoms flare-up use an extra pillow to sleep develop a new routine to improve sleep don't eat or exercise right before bedtime eat healthy/prescribed diet: heart healthy diet get at least 7 to 8 hours of sleep at night use devices that will help like a cane, sock-puller or reacher do exercises in a comfortable position that makes breathing as easy as possible - call for medicine refill 2 or 3 days before it runs out - take all medications exactly as prescribed - call doctor with any symptoms you believe are related to your medicine - call doctor when you experience any new symptoms - go  to all doctor appointments as scheduled       Plan:Telephone follow up appointment with care management team member scheduled for:  09-20-2021 at [place] am  Noreene Larsson RN, MSN, [place] Mobile: [phone]

## 2021-08-19 NOTE — Progress Notes (Unsigned)
PROVIDER NOTE: Information contained herein reflects review and annotations entered in association with encounter. Interpretation of such information and data should be left to medically-trained personnel. Information provided to patient can be located elsewhere in the medical record under "Patient Instructions". Document created using STT-dictation technology, any transcriptional errors that may result from process are unintentional.    Patient: Charles Ewing  Service Category: E/M  Provider: Gaspar Cola, MD  DOB: [birth year]  DOS: 08/23/2021  Specialty: Interventional Pain Management  MRN: [id number]  Setting: Ambulatory outpatient  PCP: Olin Hauser, DO  Type: Established Patient    Referring Provider: [name] *  Location: Office  Delivery: Face-to-face     HPI  Charles Ewing, a 81 y.o. year old male, is here today because of his No primary diagnosis found.. Charles Ewing primary complain today is No chief complaint on file. Last encounter: My last encounter with him was on 08/09/2021. Pertinent problems: Charles Ewing has Osteoarthritis of knees (Bilateral); Bilateral lower extremity edema; Chronic pain syndrome; Chronic hip pain (2ry area of Pain) (Right); Chronic lower extremity pain (3ry area of Pain) (Right); Chronic low back pain (1ry area of Pain) (Bilateral) (R>L) w/o sciatica; Lumbar facet syndrome; Dextroscoliosis of lumbar spine; DDD (degenerative disc disease), lumbosacral; Osteopenia of lumbar spine; Osteopenia determined by x-ray; Osteoarthritis of sacroiliac joints (HCC) (Bilateral); Osteoarthritis of hips (Bilateral); Chronic sacroiliac joint pain (Bilateral); Sacroiliac joint dysfunction (Bilateral); Spondylosis without myelopathy or radiculopathy, lumbosacral region; and Other spondylosis, sacral and sacrococcygeal region on their pertinent problem list. Pain Assessment: Severity of   is reported as a  /10. Location:    / . Onset:  . Quality:  . Timing:   . Modifying factor(s):  Marland Kitchen Vitals:  vitals were not taken for this visit.   Reason for encounter: post-procedure evaluation and assessment. ***  Post-procedure evaluation    Procedure:          Anesthesia, Analgesia, Anxiolysis:  Procedure #1: Type: Medial Branch Facet Block #1 Primary Purpose: Diagnostic Region: Lumbar Level: L2, L3, L4, L5, & S1 Medial Branch Level(s) Target Area: For Lumbar Facet blocks, the target is the groove formed by the junction of the transverse process and superior articular process. For the L5 dorsal ramus, the target is the notch between superior articular process and sacral ala. For the S1 dorsal ramus, the target is the superior and lateral edge of the posterior S1 Sacral foramen. Approach: Posterior, paramedial, percutaneous approach. Laterality: Bilateral  Procedure #2: Type: Sacroiliac Joint Block  #1  Primary Purpose: Diagnostic Region: Posterior Lumbosacral Level: PSIS (Posterior Superior Iliac Spine) Sacroiliac Joint Target Area: For lower sacroiliac joint block(s), the target is the inferior and posterior margin of the sacroiliac joint. Approach: Ipsilateral approach. Laterality: Bilateral  Anesthesia: Local (1-2% Lidocaine)  Anxiolysis: None  Sedation: None  Guidance: Fluoroscopy           Position: Prone   1. Lumbar facet syndrome   2. Chronic sacroiliac joint pain (Bilateral)   3. DDD (degenerative disc disease), lumbosacral   4. 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 Sacroiliac joint dysfunction (Bilateral)   7. Spondylosis without myelopathy or radiculopathy, lumbosacral region   8. Other spondylosis, sacral and sacrococcygeal region   9. Dextroscoliosis of lumbar spine    NAS-11 Pain score:   Pre-procedure: 10-Worst pain ever/10   Post-procedure: 2 /10  Effectiveness:  Initial hour after procedure:   ***. Subsequent 4-6  hours post-procedure:   ***. Analgesia past initial 6 hours:   ***. Ongoing improvement:  Analgesic:  *** Function:    ***    ROM:    ***     Pharmacotherapy Assessment  Analgesic: Oxycodone IR 5 mg tablet, 1 tab p.o. 3 times daily (# 60) (last filled on 07/04/2021) MME/day: 22.5 mg/day   Monitoring: [place] PMP: PDMP reviewed during this encounter.       Pharmacotherapy: No side-effects or adverse reactions reported. Compliance: No problems identified. Effectiveness: Clinically acceptable.  No notes on file  UDS:  Summary  Date Value Ref Range Status  06/14/2021 Note  Final    Comment:    ==================================================================== Compliance Drug Analysis, Ur ==================================================================== Test                             Result       Flag       Units  Drug Present and Declared for Prescription Verification   Lorazepam                      200          EXPECTED   ng/mg creat    Source of lorazepam is a scheduled prescription medication.    Oxycodone                      1671         EXPECTED   ng/mg creat   Noroxycodone                   2522         EXPECTED   ng/mg creat    Sources of oxycodone include scheduled prescription medications.    Noroxycodone is an expected metabolite of oxycodone.    Gabapentin                     PRESENT      EXPECTED   Fluoxetine                     PRESENT      EXPECTED   Norfluoxetine                  PRESENT      EXPECTED    Norfluoxetine is an expected metabolite of fluoxetine.  Drug Absent but Declared for Prescription Verification   Salicylate                     Not Detected UNEXPECTED    Aspirin, as indicated in the declared medication list, is not always    detected even when used as directed.    Metoprolol                     Not Detected UNEXPECTED ==================================================================== Test                      Result    Flag   Units       Ref Range   Creatinine              41               mg/dL      >=20 ==================================================================== Declared Medications:  The flagging and interpretation on this  report are based on the  following declared medications.  Unexpected results may arise from  inaccuracies in the declared medications.   **Note: The testing scope of this panel includes these medications:   Fluoxetine (Prozac)  Gabapentin (Neurontin)  Lorazepam (Ativan)  Metoprolol (Toprol)  Oxycodone (Roxicodone)   **Note: The testing scope of this panel does not include small to  moderate amounts of these reported medications:   Aspirin   **Note: The testing scope of this panel does not include the  following reported medications:   Albuterol (Ventolin HFA)  Amlodipine (Norvasc)  Fluticasone (Flonase)  Furosemide (Lasix)  Losartan (Cozaar)  Meloxicam (Mobic)  Mupirocin (Bactroban)  Nystatin  Pantoprazole (Protonix)  Potassium Chloride  Simvastatin (Zocor)  Sucralfate (Carafate)  Tamsulosin (Flomax) ==================================================================== For clinical consultation, please call [phone]. ====================================================================      ROS  Constitutional: Denies any fever or chills Gastrointestinal: No reported hemesis, hematochezia, vomiting, or acute GI distress Musculoskeletal: Denies any acute onset joint swelling, redness, loss of ROM, or weakness Neurological: No reported episodes of acute onset apraxia, aphasia, dysarthria, agnosia, amnesia, paralysis, loss of coordination, or loss of consciousness  Medication Review  FLUoxetine, LORazepam, Vitamin D3, albuterol, amLODipine, aspirin EC, ergocalciferol, fluticasone, furosemide, gabapentin, losartan, meloxicam, onetouch ultrasoft, oxyCODONE, pantoprazole, potassium chloride, simvastatin, and tamsulosin  History Review  Allergy: Charles Ewing is allergic  to nexium [esomeprazole magnesium]. Drug: Charles Ewing  reports no history of drug use. Alcohol:  reports no history of alcohol use. Tobacco:  reports that he has been smoking cigarettes. He has a 70.00 pack-year smoking history. He has never used smokeless tobacco. Social: Charles Ewing  reports that he has been smoking cigarettes. He has a 70.00 pack-year smoking history. He has never used smokeless tobacco. He reports that he does not drink alcohol and does not use drugs. Medical:  has a past medical history of Abdominal aortic aneurysm (AAA) ([place]) (2013), Anxiety, Arrhythmia, Arthritis, Colon polyp, COPD (chronic obstructive pulmonary disease) ([place]), Coronary artery disease, Depression, GERD (gastroesophageal reflux disease), Hyperlipidemia, Hypertension, Skin cancer, and Thrush. Surgical: Charles Ewing  has a past surgical history that includes Skin cancer excision; Colon polyp surgery; Transurethral resection of prostate (2010); Cataract extraction w/PHACO (Left, 05/09/2020); and Cataract extraction w/PHACO (Right, 05/23/2020). Family: family history includes Kidney cancer in his mother.  Laboratory Chemistry Profile   Renal Lab Results  Component Value Date   BUN 18 06/13/2021   CREATININE 1.18 06/13/2021   BCR 15 06/13/2021   GFRAA 64 01/11/2019   GFRNONAA >60 12/23/2020    Hepatic Lab Results  Component Value Date   AST 13 06/13/2021   ALT 11 10/30/2020   ALBUMIN 3.9 06/13/2021   ALKPHOS 70 06/13/2021    Electrolytes Lab Results  Component Value Date   NA 138 06/13/2021   K 4.6 06/13/2021   CL 102 06/13/2021   CALCIUM 8.8 06/13/2021   MG 2.3 06/13/2021    Bone Lab Results  Component Value Date   25OHVITD1 14 (L) 06/13/2021   25OHVITD2 1.8 06/13/2021   25OHVITD3 12 06/13/2021    Inflammation (CRP: Acute Phase) (ESR: Chronic Phase) Lab Results  Component Value Date   CRP 1 06/13/2021   ESRSEDRATE 20 06/13/2021         Note: Above Lab results reviewed.  Recent Imaging  Review  DG PAIN CLINIC C-ARM 1-60 MIN NO REPORT Fluoro was used, but no Radiologist interpretation will be provided.  Please refer to "NOTES" tab for provider progress note. Note: Reviewed  Physical Exam  General appearance: Well nourished, well developed, and well hydrated. In no apparent acute distress Mental status: Alert, oriented x 3 (person, place, & time)       Respiratory: No evidence of acute respiratory distress Eyes: PERLA Vitals: There were no vitals taken for this visit. BMI: Estimated body mass index is 27.2 kg/m as calculated from the following:   Height as of 08/09/21: '5\' 11"'  (1.803 m).   Weight as of 08/09/21: 195 lb (88.5 kg). Ideal: Ideal body weight: 75.3 kg (166 lb 0.1 oz) Adjusted ideal body weight: 80.6 kg (177 lb 9.7 oz)  Assessment   Diagnosis Status  No diagnosis found. Controlled Controlled Controlled   Updated Problems: No problems updated.  Plan of Care  Problem-specific:  No problem-specific Assessment & Plan notes found for this encounter.  Charles Ewing has a current medication list which includes the following long-term medication(s): albuterol, amlodipine, fluoxetine, fluticasone, furosemide, gabapentin, losartan, oxycodone, potassium chloride, and simvastatin.  Pharmacotherapy (Medications Ordered): No orders of the defined types were placed in this encounter.  Orders:  No orders of the defined types were placed in this encounter.  Follow-up plan:   No follow-ups on file.     Interventional Therapies  Risk  Complexity Considerations:   Estimated body mass index is 28.29 kg/m as calculated from the following:   Height as of this encounter: 5' 10.5" (1.791 m).   Weight as of this encounter: 200 lb (90.7 kg). History of: abdominal aortic aneurysm (AAA); hypertension; cardiomyopathy; coronary artery disease; dissection of abdominal aorta; orthostatic hypotension; irregular heart rhythm; chronic kidney disease; shortness of  breath; tobacco abuse; COPD; GERD   Planned  Pending:   Pending further evaluation   Under consideration:   Diagnostic right lumbar facet MBB #1  Diagnostic bilateral SI block #1  Diagnostic right LESI #1  Diagnostic right TFESI #1    Completed:   None at this time   Completed by other providers:   Diagnostic/therapeutic bilateral IA steroid knee inj. (03/15/2016) by Carlynn Spry, PA Rosanne Gutting)    Therapeutic  Palliative (PRN) options:   None established      Recent Visits Date Type Provider Dept  08/09/21 Procedure visit Milinda Pointer, MD [facility]  07/23/21 Office Visit Milinda Pointer, MD [facility]  06/13/21 Office Visit Milinda Pointer, MD [facility]  Showing recent visits within past 90 days and meeting all other requirements Future Appointments Date Type Provider Dept  08/23/21 Appointment Milinda Pointer, [facility]  08/27/21 Appointment Milinda Pointer, MD [facility]  Showing future appointments within next 90 days and meeting all other requirements  I discussed the assessment and treatment plan with the patient. The patient was provided an opportunity to ask questions and all were answered. The patient agreed with the plan and demonstrated an understanding of the instructions.  Patient advised to call back or seek an in-person evaluation if the symptoms or condition worsens.  Duration of encounter: *** minutes.  Total time on encounter, as per AMA guidelines included both the face-to-face and non-face-to-face time personally spent by the physician and/or other qualified health care professional(s) on the day of the encounter (includes time in activities that require the physician or other qualified health care professional and does not include time in activities normally performed by clinical staff). Physician's time may include the following activities when performed: preparing to see  the patient (eg, review of tests, pre-charting review of records) obtaining and/or  reviewing separately obtained history performing a medically appropriate examination and/or evaluation counseling and educating the patient/family/caregiver ordering medications, tests, or procedures referring and communicating with other health care professionals (when not separately reported) documenting clinical information in the electronic or other health record independently interpreting results (not separately reported) and communicating results to the patient/ family/caregiver care coordination (not separately reported)  Note by: Gaspar Cola, MD Date: 08/23/2021; Time: 4:08 PM

## 2021-08-23 ENCOUNTER — Ambulatory Visit (HOSPITAL_BASED_OUTPATIENT_CLINIC_OR_DEPARTMENT_OTHER): Payer: Medicare Other | Admitting: Pain Medicine

## 2021-08-23 DIAGNOSIS — G8929 Other chronic pain: Secondary | ICD-10-CM

## 2021-08-23 DIAGNOSIS — M545 Low back pain, unspecified: Secondary | ICD-10-CM

## 2021-08-23 DIAGNOSIS — Z91199 Patient's noncompliance with other medical treatment and regimen due to unspecified reason: Secondary | ICD-10-CM

## 2021-08-23 DIAGNOSIS — M47816 Spondylosis without myelopathy or radiculopathy, lumbar region: Secondary | ICD-10-CM

## 2021-08-26 NOTE — Progress Notes (Unsigned)
PROVIDER NOTE: Information contained herein reflects review and annotations entered in association with encounter. Interpretation of such information and data should be left to medically-trained personnel. Information provided to patient can be located elsewhere in the medical record under "Patient Instructions". Document created using STT-dictation technology, any transcriptional errors that may result from process are unintentional.    Patient: Charles Ewing  Service Category: E/M  Provider: Gaspar Cola, MD  DOB: 01/11/1941  DOS: 08/27/2021  Specialty: Interventional Pain Management  MRN: [id number]  Setting: Ambulatory outpatient  PCP: Olin Hauser, DO  Type: Established Patient    Referring Provider: [name] *  Location: Office  Delivery: Face-to-face     HPI  [name], a 81 y.o. year old male, is here today because of his No primary diagnosis found.. Mr. Mehlhoff primary complain today is No chief complaint on file. Last encounter: My last encounter with him was on 08/23/2021. Pertinent problems: Mr. Jablonowski has Osteoarthritis of knees (Bilateral); Bilateral lower extremity edema; Chronic pain syndrome; Chronic hip pain (2ry area of Pain) (Right); Chronic lower extremity pain (3ry area of Pain) (Right); Chronic low back pain (1ry area of Pain) (Bilateral) (R>L) w/o sciatica; Lumbar facet syndrome; Dextroscoliosis of lumbar spine; DDD (degenerative disc disease), lumbosacral; Osteopenia of lumbar spine; Osteopenia determined by x-ray; Osteoarthritis of sacroiliac joints (HCC) (Bilateral); Osteoarthritis of hips (Bilateral); Chronic sacroiliac joint pain (Bilateral); Sacroiliac joint dysfunction (Bilateral); Spondylosis without myelopathy or radiculopathy, lumbosacral region; and Other spondylosis, sacral and sacrococcygeal region on their pertinent problem list. Pain Assessment: Severity of   is reported as a  /10. Location:    / . Onset:  . Quality:  . Timing:   . Modifying factor(s):  Marland Kitchen Vitals:  vitals were not taken for this visit.   Reason for encounter:  *** . ***  Pharmacotherapy Assessment  Analgesic: Oxycodone IR 5 mg tablet, 1 tab p.o. 3 times daily (# 60) (last filled on 07/04/2021) MME/day: 22.5 mg/day   Monitoring: [place] PMP: PDMP reviewed during this encounter.       Pharmacotherapy: No side-effects or adverse reactions reported. Compliance: No problems identified. Effectiveness: Clinically acceptable.  No notes on file  UDS:  Summary  Date Value Ref Range Status  06/14/2021 Note  Final    Comment:    ==================================================================== Compliance Drug Analysis, Ur ==================================================================== Test                             Result       Flag       Units  Drug Present and Declared for Prescription Verification   Lorazepam                      200          EXPECTED   ng/mg creat    Source of lorazepam is a scheduled prescription medication.    Oxycodone                      1671         EXPECTED   ng/mg creat   Noroxycodone                   2522         EXPECTED   ng/mg creat    Sources of oxycodone include scheduled prescription medications.    Noroxycodone is an expected metabolite of oxycodone.  Gabapentin                     PRESENT      EXPECTED   Fluoxetine                     PRESENT      EXPECTED   Norfluoxetine                  PRESENT      EXPECTED    Norfluoxetine is an expected metabolite of fluoxetine.  Drug Absent but Declared for Prescription Verification   Salicylate                     Not Detected UNEXPECTED    Aspirin, as indicated in the declared medication list, is not always    detected even when used as directed.    Metoprolol                     Not Detected UNEXPECTED ==================================================================== Test                      Result    Flag   Units      Ref Range   Creatinine               41               mg/dL      >=20 ==================================================================== Declared Medications:  The flagging and interpretation on this report are based on the  following declared medications.  Unexpected results may arise from  inaccuracies in the declared medications.   **Note: The testing scope of this panel includes these medications:   Fluoxetine (Prozac)  Gabapentin (Neurontin)  Lorazepam (Ativan)  Metoprolol (Toprol)  Oxycodone (Roxicodone)   **Note: The testing scope of this panel does not include small to  moderate amounts of these reported medications:   Aspirin   **Note: The testing scope of this panel does not include the  following reported medications:   Albuterol (Ventolin HFA)  Amlodipine (Norvasc)  Fluticasone (Flonase)  Furosemide (Lasix)  Losartan (Cozaar)  Meloxicam (Mobic)  Mupirocin (Bactroban)  Nystatin  Pantoprazole (Protonix)  Potassium Chloride  Simvastatin (Zocor)  Sucralfate (Carafate)  Tamsulosin (Flomax) ==================================================================== For clinical consultation, please call [phone]. ====================================================================      ROS  Constitutional: Denies any fever or chills Gastrointestinal: No reported hemesis, hematochezia, vomiting, or acute GI distress Musculoskeletal: Denies any acute onset joint swelling, redness, loss of ROM, or weakness Neurological: No reported episodes of acute onset apraxia, aphasia, dysarthria, agnosia, amnesia, paralysis, loss of coordination, or loss of consciousness  Medication Review  FLUoxetine, LORazepam, Vitamin D3, albuterol, amLODipine, aspirin EC, ergocalciferol, fluticasone, furosemide, gabapentin, losartan, meloxicam, onetouch ultrasoft, oxyCODONE, pantoprazole, potassium chloride, simvastatin, and tamsulosin  History Review  Allergy: Mr. Sherod is allergic to nexium [esomeprazole  magnesium]. Drug: Mr. Ewing  reports no history of drug use. Alcohol:  reports no history of alcohol use. Tobacco:  reports that he has been smoking cigarettes. He has a 70.00 pack-year smoking history. He has never used smokeless tobacco. Social: Mr. Hodgkin  reports that he has been smoking cigarettes. He has a 70.00 pack-year smoking history. He has never used smokeless tobacco. He reports that he does not drink alcohol and does not use drugs. Medical:  has a past medical history of Abdominal aortic aneurysm (AAA) ([place]) (2013), Anxiety, Arrhythmia, Arthritis, Colon polyp, COPD (chronic  obstructive pulmonary disease) ([place]), Coronary artery disease, Depression, GERD (gastroesophageal reflux disease), Hyperlipidemia, Hypertension, Skin cancer, and Thrush. Surgical: Mr. Cervi  has a past surgical history that includes Skin cancer excision; Colon polyp surgery; Transurethral resection of prostate (2010); Cataract extraction w/PHACO (Left, 05/09/2020); and Cataract extraction w/PHACO (Right, 05/23/2020). Family: family history includes Kidney cancer in his mother.  Laboratory Chemistry Profile   Renal Lab Results  Component Value Date   BUN 18 06/13/2021   CREATININE 1.18 06/13/2021   BCR 15 06/13/2021   GFRAA 64 01/11/2019   GFRNONAA >60 12/23/2020    Hepatic Lab Results  Component Value Date   AST 13 06/13/2021   ALT 11 10/30/2020   ALBUMIN 3.9 06/13/2021   ALKPHOS 70 06/13/2021    Electrolytes Lab Results  Component Value Date   NA 138 06/13/2021   K 4.6 06/13/2021   CL 102 06/13/2021   CALCIUM 8.8 06/13/2021   MG 2.3 06/13/2021    Bone Lab Results  Component Value Date   25OHVITD1 14 (L) 06/13/2021   25OHVITD2 1.8 06/13/2021   25OHVITD3 12 06/13/2021    Inflammation (CRP: Acute Phase) (ESR: Chronic Phase) Lab Results  Component Value Date   CRP 1 06/13/2021   ESRSEDRATE 20 06/13/2021         Note: Above Lab results reviewed.  Recent Imaging Review  DG PAIN CLINIC  C-ARM 1-60 MIN NO REPORT Fluoro was used, but no Radiologist interpretation will be provided.  Please refer to "NOTES" tab for provider progress note. Note: Reviewed        Physical Exam  General appearance: Well nourished, well developed, and well hydrated. In no apparent acute distress Mental status: Alert, oriented x 3 (person, place, & time)       Respiratory: No evidence of acute respiratory distress Eyes: PERLA Vitals: There were no vitals taken for this visit. BMI: Estimated body mass index is 27.2 kg/m as calculated from the following:   Height as of 08/09/21: '5\' 11"'  (1.803 m).   Weight as of 08/09/21: 195 lb (88.5 kg). Ideal: Patient weight not recorded  Assessment   Diagnosis Status  1. Dextroscoliosis of lumbar spine   2. DDD (degenerative disc disease), lumbosacral   3. Osteoarthritis of sacroiliac joints (HCC) (Bilateral)   4. Osteoarthritis of hips (Bilateral)   5. Chronic low back pain (1ry area of Pain) (Bilateral) (R>L) w/o sciatica   6. Chronic hip pain (2ry area of Pain) (Right)   7. Chronic lower extremity pain (3ry area of Pain) (Right)   8. Chronic pain syndrome   9. Lumbar facet syndrome (Right)   10. Pharmacologic therapy   11. Chronic use of opiate for therapeutic purpose   12. Encounter for medication management   13. Encounter for chronic pain management    Controlled Controlled Controlled   Updated Problems: No problems updated.  Plan of Care  Problem-specific:  No problem-specific Assessment & Plan notes found for this encounter.  Mr. RODY KEADLE has a current medication list which includes the following long-term medication(s): albuterol, amlodipine, fluoxetine, fluticasone, furosemide, gabapentin, losartan, oxycodone, potassium chloride, and simvastatin.  Pharmacotherapy (Medications Ordered): No orders of the defined types were placed in this encounter.  Orders:  No orders of the defined types were placed in this  encounter.  Follow-up plan:   No follow-ups on file.     Interventional Therapies  Risk  Complexity Considerations:   Estimated body mass index is 28.29 kg/m as calculated from the following:  Height as of this encounter: 5' 10.5" (1.791 m).   Weight as of this encounter: 200 lb (90.7 kg). History of: abdominal aortic aneurysm (AAA); hypertension; cardiomyopathy; coronary artery disease; dissection of abdominal aorta; orthostatic hypotension; irregular heart rhythm; chronic kidney disease; shortness of breath; tobacco abuse; COPD; GERD   Planned  Pending:   Pending further evaluation   Under consideration:   Diagnostic right lumbar facet MBB #1  Diagnostic bilateral SI block #1  Diagnostic right LESI #1  Diagnostic right TFESI #1    Completed:   None at this time   Completed by other providers:   Diagnostic/therapeutic bilateral IA steroid knee inj. (03/15/2016) by Carlynn Spry, PA Rosanne Gutting)    Therapeutic  Palliative (PRN) options:   None established      Recent Visits Date Type Provider Dept  08/23/21 Office Visit Milinda Pointer, MD [facility]  08/09/21 Procedure visit Milinda Pointer, MD [facility]  07/23/21 Office Visit Milinda Pointer, MD [facility]  06/13/21 Office Visit Milinda Pointer, MD [facility]  Showing recent visits within past 90 days and meeting all other requirements Future Appointments Date Type Provider Dept  08/27/21 Appointment Milinda Pointer, MD [facility]  Showing future appointments within next 90 days and meeting all other requirements  I discussed the assessment and treatment plan with the patient. The patient was provided an opportunity to ask questions and all were answered. The patient agreed with the plan and demonstrated an understanding of the instructions.  Patient advised to call back or seek an in-person evaluation if the symptoms or condition  worsens.  Duration of encounter: *** minutes.  Total time on encounter, as per AMA guidelines included both the face-to-face and non-face-to-face time personally spent by the physician and/or other qualified health care professional(s) on the day of the encounter (includes time in activities that require the physician or other qualified health care professional and does not include time in activities normally performed by clinical staff). Physician's time may include the following activities when performed: preparing to see the patient (eg, review of tests, pre-charting review of records) obtaining and/or reviewing separately obtained history performing a medically appropriate examination and/or evaluation counseling and educating the patient/family/caregiver ordering medications, tests, or procedures referring and communicating with other health care professionals (when not separately reported) documenting clinical information in the electronic or other health record independently interpreting results (not separately reported) and communicating results to the patient/ family/caregiver care coordination (not separately reported)  Note by: Gaspar Cola, MD Date: 08/27/2021; Time: 3:26 PM

## 2021-08-27 ENCOUNTER — Encounter: Payer: Self-pay | Admitting: Pain Medicine

## 2021-08-27 ENCOUNTER — Ambulatory Visit: Payer: Medicare Other | Attending: Pain Medicine | Admitting: Pain Medicine

## 2021-08-27 ENCOUNTER — Other Ambulatory Visit: Payer: Self-pay | Admitting: Pain Medicine

## 2021-08-27 ENCOUNTER — Ambulatory Visit: Payer: Medicare Other | Admitting: Pharmacist

## 2021-08-27 VITALS — BP 133/68 | HR 67 | Temp 97.1°F | Resp 18 | Ht 71.0 in | Wt 195.0 lb

## 2021-08-27 DIAGNOSIS — M160 Bilateral primary osteoarthritis of hip: Secondary | ICD-10-CM | POA: Insufficient documentation

## 2021-08-27 DIAGNOSIS — M4186 Other forms of scoliosis, lumbar region: Secondary | ICD-10-CM | POA: Diagnosis not present

## 2021-08-27 DIAGNOSIS — J432 Centrilobular emphysema: Secondary | ICD-10-CM

## 2021-08-27 DIAGNOSIS — Z79899 Other long term (current) drug therapy: Secondary | ICD-10-CM | POA: Diagnosis not present

## 2021-08-27 DIAGNOSIS — G894 Chronic pain syndrome: Secondary | ICD-10-CM | POA: Insufficient documentation

## 2021-08-27 DIAGNOSIS — M461 Sacroiliitis, not elsewhere classified: Secondary | ICD-10-CM | POA: Diagnosis not present

## 2021-08-27 DIAGNOSIS — M47816 Spondylosis without myelopathy or radiculopathy, lumbar region: Secondary | ICD-10-CM | POA: Diagnosis not present

## 2021-08-27 DIAGNOSIS — M545 Low back pain, unspecified: Secondary | ICD-10-CM | POA: Diagnosis not present

## 2021-08-27 DIAGNOSIS — M25551 Pain in right hip: Secondary | ICD-10-CM | POA: Diagnosis not present

## 2021-08-27 DIAGNOSIS — Z79891 Long term (current) use of opiate analgesic: Secondary | ICD-10-CM | POA: Insufficient documentation

## 2021-08-27 DIAGNOSIS — M5137 Other intervertebral disc degeneration, lumbosacral region: Secondary | ICD-10-CM | POA: Diagnosis not present

## 2021-08-27 DIAGNOSIS — M79604 Pain in right leg: Secondary | ICD-10-CM | POA: Insufficient documentation

## 2021-08-27 DIAGNOSIS — G8929 Other chronic pain: Secondary | ICD-10-CM | POA: Diagnosis not present

## 2021-08-27 DIAGNOSIS — E785 Hyperlipidemia, unspecified: Secondary | ICD-10-CM

## 2021-08-27 DIAGNOSIS — E538 Deficiency of other specified B group vitamins: Secondary | ICD-10-CM

## 2021-08-27 DIAGNOSIS — I129 Hypertensive chronic kidney disease with stage 1 through stage 4 chronic kidney disease, or unspecified chronic kidney disease: Secondary | ICD-10-CM

## 2021-08-27 MED ORDER — SIMVASTATIN 20 MG PO TABS: 20.0000 mg | ORAL_TABLET | Freq: Every day | ORAL | 0 refills | Status: DC

## 2021-08-27 MED ORDER — ALBUTEROL SULFATE HFA 108 (90 BASE) MCG/ACT IN AERS: 2.0000 | INHALATION_SPRAY | Freq: Four times a day (QID) | RESPIRATORY_TRACT | 1 refills | Status: DC | PRN

## 2021-08-27 MED ORDER — OXYCODONE HCL 5 MG PO TABS: 5.0000 mg | ORAL_TABLET | Freq: Two times a day (BID) | ORAL | 0 refills | Status: DC | PRN

## 2021-08-27 NOTE — Patient Instructions (Signed)
Visit Information   Thank you for taking time to visit with me today. Please don't hesitate to contact me if I can be of assistance to you before our next scheduled telephone appointment.  Following are the goals we discussed today:   Goals Addressed             This Visit's Progress    Pharmacy Goals       Please check your home blood pressure, keep a log of the results and bring this with you to your medical appointments.  Our goal bad cholesterol, or LDL, is less than 70 . This is why it is important to continue taking your simvastatin 20 mg daily.  Thank you!  Wallace Cullens, PharmD, Para [place], CPP Clinical Pharmacist [facility] [phone]          Our next appointment is by telephone on 09/24/2021 at 10:45 am  Please call the care guide team at [phone] if you need to cancel or reschedule your appointment.    Following is a copy of your full care plan:    Care Plan : PharmD - Medication Adherence/Management  Updates made by Rennis Petty, RPH-CPP since 08/27/2021 12:00 AM     Problem: Disease Progression      Long-Range Goal: Disease Progression Prevented or Minimized   Start Date: 08/27/2021  Expected End Date: 11/25/2021  Priority: High  Note:   Current Barriers:  Chronic Disease Management support and education needs related to HTN, HLD, COPD, Chronic Pain and GERD  Pharmacist Clinical Goal(s):  patient will achieve adherence to monitoring guidelines and medication adherence to achieve therapeutic efficacy through collaboration with PharmD and provider.    Interventions: 1:1 collaboration with Olin Hauser, DO regarding development and update of comprehensive plan of care as evidenced by provider attestation and co-signature Inter-disciplinary care team collaboration (see longitudinal plan of care) Perform chart review Office Visit with [facility] today Office Visit with PCP on  6/26. Provider advised patient:  Can switch to every OTHER day dosing. Lasix 31m + Potassium 10 mEq. If develop worse swelling may need regularly again Finish the WEEKLY Vitamin D 50,000 unit - 3 pills left, after the weekly doses are done, then you can start the DAILY Vitamin D rx 5,000 unit once a day [facility] to [facility] Comprehensive medication review performed; medication list updated in electronic medical record Confirms completed course of WEEKLY Vitamin D 50,000 Rx and now taking the DAILY Vitamin D rx 5,000 unit once a day Caution patient for risk of dizziness, sedation and increased risk of falls with medications including gabapentin, lorazepam, oxycodone, particularly when taken in combination Patient reports tolerating these medications well now, denies difficulty with dizziness, sedation or recent falls. Reports using a cane for additional support Denies difficulty with dry mouth ([facility]), constipation or difficulty with swallowing medications, including his potassium Potential drug-drug interaction: amlodipine may increase the serum concentration of simvastatin. Max dose of simvastatin: 20 mg daily if coadministering with amlodipine Patient reports that he takes his simvastatin 40 mg -  tablet (20 mg) daily. Reports he has been taking his simvastatin this way for >10 years and tolerating well From review of latest lipid panel, note LDL controlled Offer to adjust patient's simvastatin Rx to simvastatin 20 mg tablet - 1 tablet daily to ease administration  burden/accuracy. Patient is interested CPP sends Rx for simvastatin 20 mg daily to pharmacy for patient Patient to use caution with use of meloxicam given age and renal function. Confirms using as needed Per patient/from review of chart, note he previously had a low Vitamin  B12 level on 06/13/2021 and pain management provider prescribed 30 days of Vitamin B12 5,000 mcg SL daily. Patient wonders if he should take further Vitamin B12  Will collaborate with PCP regarding whether patient would benefit from Vitamin B12 500 mcg daily for prevention Reports his son lives with him and provides support  Medication Adherence: Reports uses weekly pillbox to organize his medications Encourage patient to obtain and start using a 2nd weekly pillbox (through health plan over the counter benefit) for evening medications Reports medications affordable through [facility] Dual Complete coverage, but also uses [facility] (for Spiriva inhaler) Identify patient in need of refill of Spiriva inhaler. Confirms has contacted [facility] to request a refill  COPD: Current treatment: Spiriva Respimat 2.5 mcg - 2 puffs daily (filled through [facility]) Reports used to have an albuterol rescue inhaler, but needs a renewal CPP will send renewal of albuterol Rx to pharmacy today Reports uses Spiriva maintenance inhaler daily as directed Reports also uses a daily mouth rinse from [facility] daily to prevent dry mouth   Hypertension: Current treatment: Amlodipine 5 mg daily Losartan 25 mg daily Furosemide 20 mg every other day Potassium chloride 10 meq every other day Reports has home upper arm blood pressure monitor and checks home BP Reports last checked blood pressure today (office visit reading): 133/68, HR 67 Denies symptoms of hypotension Denies any s/s of swelling  Tobacco use: Currently smokes ~1 pack/day Counsel on benefits of smoking cessation Denies interest in quitting at this time  Patient Goals/Self-Care Activities patient will:  - take medications as prescribed as evidenced by patient report and record review - focus on medication adherence by using AM and PM weekly pillboxes - check blood pressure, document, and provide at future appointments - attend medical  appointments as scheduled  Office visit with [facility] now scheduled for 8/2       Consent to CCM Services: Mr. Neddo was given information about Chronic Care Management services including:  CCM service includes personalized support from designated clinical staff supervised by his physician, including individualized plan of care and coordination with other care providers 24/7 contact phone numbers for assistance for urgent and routine care needs. Service will only be billed when office clinical staff spend 20 minutes or more in a month to coordinate care. Only one practitioner may furnish and bill the service in a calendar month. The patient may stop CCM services at any time (effective at the end of the month) by phone call to the office staff. The patient will be responsible for cost sharing (co-pay) of up to 20% of the service fee (after annual deductible is met).  Patient agreed to services and verbal consent obtained.   The patient verbalized understanding of instructions, educational materials, and care plan provided today and DECLINED offer to receive copy of patient instructions, educational materials, and care plan.   Telephone follow up appointment with care management team member scheduled for: 09/24/2021 at 10:45 am

## 2021-08-27 NOTE — Progress Notes (Signed)
Nursing Pain Medication Assessment:  Safety precautions to be maintained throughout the outpatient stay will include: orient to surroundings, keep bed in low position, maintain call bell within reach at all times, provide assistance with transfer out of bed and ambulation.  Medication Inspection Compliance: Charles Ewing did not comply with our request to bring his pills to be counted. He was reminded that bringing the medication bottles, even when empty, is a requirement.  Medication: None brought in. Pill/Patch Count: None available to be counted. Bottle Appearance: No container available. Did not bring bottle(s) to appointment. Filled Date: N/A Last Medication intake:  Yesterday

## 2021-08-27 NOTE — Patient Instructions (Addendum)
____________________________________________________________________________________________  Medication Rules  Purpose: To inform patients, and their family members, of our rules and regulations.  Applies to: All patients receiving prescriptions (written or electronic).  Pharmacy of record: Pharmacy where electronic prescriptions will be sent. If written prescriptions are taken to a different pharmacy, please inform the nursing staff. The pharmacy listed in the electronic medical record should be the one where you would like electronic prescriptions to be sent.  Electronic prescriptions: In compliance with the [place] Strengthen Opioid Misuse Prevention (STOP) Act of 2017 (Session Law 2017-74/H243), effective February 11, 2018, all controlled substances must be electronically prescribed. Calling prescriptions to the pharmacy will cease to exist.  Prescription refills: Only during scheduled appointments. Applies to all prescriptions.  NOTE: The following applies primarily to controlled substances (Opioid* Pain Medications).   Type of encounter (visit): For patients receiving controlled substances, face-to-face visits are required. (Not an option or up to the patient.)  Patient's responsibilities: Pain Pills: Bring all pain pills to every appointment (except for procedure appointments). Pill Bottles: Bring pills in original pharmacy bottle. Always bring the newest bottle. Bring bottle, even if empty. Medication refills: You are responsible for knowing and keeping track of what medications you take and those you need refilled. The day before your appointment: write a list of all prescriptions that need to be refilled. The day of the appointment: give the list to the admitting nurse. Prescriptions will be written only during appointments. No prescriptions will be written on procedure days. If you forget a medication: it will not be "Called in", "Faxed", or "electronically sent". You will  need to get another appointment to get these prescribed. No early refills. Do not call asking to have your prescription filled early. Prescription Accuracy: You are responsible for carefully inspecting your prescriptions before leaving our office. Have the discharge nurse carefully go over each prescription with you, before taking them home. Make sure that your name is accurately spelled, that your address is correct. Check the name and dose of your medication to make sure it is accurate. Check the number of pills, and the written instructions to make sure they are clear and accurate. Make sure that you are given enough medication to last until your next medication refill appointment. Taking Medication: Take medication as prescribed. When it comes to controlled substances, taking less pills or less frequently than prescribed is permitted and encouraged. Never take more pills than instructed. Never take medication more frequently than prescribed.  Inform other Doctors: Always inform, all of your healthcare providers, of all the medications you take. Pain Medication from other Providers: You are not allowed to accept any additional pain medication from any other Doctor or Healthcare provider. There are two exceptions to this rule. (see below) In the event that you require additional pain medication, you are responsible for notifying us, as stated below. Cough Medicine: Often these contain an opioid, such as codeine or hydrocodone. Never accept or take cough medicine containing these opioids if you are already taking an opioid* medication. The combination may cause respiratory failure and death. Medication Agreement: You are responsible for carefully reading and following our Medication Agreement. This must be signed before receiving any prescriptions from our practice. Safely store a copy of your signed Agreement. Violations to the Agreement will result in no further prescriptions. (Additional copies of our  Medication Agreement are available upon request.) Laws, Rules, & Regulations: All patients are expected to follow all Federal and State Laws, Statutes, Rules, & Regulations. Ignorance of   the Laws does not constitute a valid excuse.  Illegal drugs and Controlled Substances: The use of illegal substances (including, but not limited to marijuana and its derivatives) and/or the illegal use of any controlled substances is strictly prohibited. Violation of this rule may result in the immediate and permanent discontinuation of any and all prescriptions being written by our practice. The use of any illegal substances is prohibited. Adopted CDC guidelines & recommendations: Target dosing levels will be at or below 60 MME/day. Use of benzodiazepines** is not recommended.  Exceptions: There are only two exceptions to the rule of not receiving pain medications from other Healthcare Providers. Exception #1 (Emergencies): In the event of an emergency (i.e.: accident requiring emergency care), you are allowed to receive additional pain medication. However, you are responsible for: As soon as you are able, call our office [phone], at any time of the day or night, and leave a message stating your name, the date and nature of the emergency, and the name and dose of the medication prescribed. In the event that your call is answered by a member of our staff, make sure to document and save the date, time, and the name of the person that took your information.  Exception #2 (Planned Surgery): In the event that you are scheduled by another doctor or dentist to have any type of surgery or procedure, you are allowed (for a period no longer than 30 days), to receive additional pain medication, for the acute post-op pain. However, in this case, you are responsible for picking up a copy of our "Post-op Pain Management for Surgeons" handout, and giving it to your surgeon or dentist. This document is available at our office, and  does not require an appointment to obtain it. Simply go to our office during business hours (Monday-Thursday from 8:00 AM to 4:00 PM) (Friday 8:00 AM to 12:00 Noon) or if you have a scheduled appointment with us, prior to your surgery, and ask for it by name. In addition, you are responsible for: calling our office [phone], at any time of the day or night, and leaving a message stating your name, name of your surgeon, type of surgery, and date of procedure or surgery. Failure to comply with your responsibilities may result in termination of therapy involving the controlled substances. Medication Agreement Violation. Following the above rules, including your responsibilities will help you in avoiding a Medication Agreement Violation ("Breaking your Pain Medication Contract").  *Opioid medications include: morphine, codeine, oxycodone, oxymorphone, hydrocodone, hydromorphone, meperidine, tramadol, tapentadol, buprenorphine, fentanyl, methadone. **Benzodiazepine medications include: diazepam (Valium), alprazolam (Xanax), clonazepam (Klonopine), lorazepam (Ativan), clorazepate (Tranxene), chlordiazepoxide (Librium), estazolam (Prosom), oxazepam (Serax), temazepam (Restoril), triazolam (Halcion) (Last updated: 11/08/2020) ____________________________________________________________________________________________  ____________________________________________________________________________________________  Medication Recommendations and Reminders  Applies to: All patients receiving prescriptions (written and/or electronic).  Medication Rules & Regulations: These rules and regulations exist for your safety and that of others. They are not flexible and neither are we. Dismissing or ignoring them will be considered "non-compliance" with medication therapy, resulting in complete and irreversible termination of such therapy. (See document titled "Medication Rules" for more details.) In all conscience,  because of safety reasons, we cannot continue providing a therapy where the patient does not follow instructions.  Pharmacy of record:  Definition: This is the pharmacy where your electronic prescriptions will be sent.  We do not endorse any particular pharmacy, however, we have experienced problems with [facility] not securing enough medication supply for the community. We do not restrict you   in your choice of pharmacy. However, once we write for your prescriptions, we will NOT be re-sending more prescriptions to fix restricted supply problems created by your pharmacy, or your insurance.  The pharmacy listed in the electronic medical record should be the one where you want electronic prescriptions to be sent. If you choose to change pharmacy, simply notify our nursing staff.  Recommendations: Keep all of your pain medications in a safe place, under lock and key, even if you live alone. We will NOT replace lost, stolen, or damaged medication. After you fill your prescription, take 1 week's worth of pills and put them away in a safe place. You should keep a separate, properly labeled bottle for this purpose. The remainder should be kept in the original bottle. Use this as your primary supply, until it runs out. Once it's gone, then you know that you have 1 week's worth of medicine, and it is time to come in for a prescription refill. If you do this correctly, it is unlikely that you will ever run out of medicine. To make sure that the above recommendation works, it is very important that you make sure your medication refill appointments are scheduled at least 1 week before you run out of medicine. To do this in an effective manner, make sure that you do not leave the office without scheduling your next medication management appointment. Always ask the nursing staff to show you in your prescription , when your medication will be running out. Then arrange for the receptionist to get you a return appointment,  at least 7 days before you run out of medicine. Do not wait until you have 1 or 2 pills left, to come in. This is very poor planning and does not take into consideration that we may need to cancel appointments due to bad weather, sickness, or emergencies affecting our staff. DO NOT ACCEPT A "Partial Fill": If for any reason your pharmacy does not have enough pills/tablets to completely fill or refill your prescription, do not allow for a "partial fill". The law allows the pharmacy to complete that prescription within 72 hours, without requiring a new prescription. If they do not fill the rest of your prescription within those 72 hours, you will need a separate prescription to fill the remaining amount, which we will NOT provide. If the reason for the partial fill is your insurance, you will need to talk to the pharmacist about payment alternatives for the remaining tablets, but again, DO NOT ACCEPT A PARTIAL FILL, unless you can trust your pharmacist to obtain the remainder of the pills within 72 hours.  Prescription refills and/or changes in medication(s):  Prescription refills, and/or changes in dose or medication, will be conducted only during scheduled medication management appointments. (Applies to both, written and electronic prescriptions.) No refills on procedure days. No medication will be changed or started on procedure days. No changes, adjustments, and/or refills will be conducted on a procedure day. Doing so will interfere with the diagnostic portion of the procedure. No phone refills. No medications will be "called into the pharmacy". No Fax refills. No weekend refills. No Holliday refills. No after hours refills.  Remember:  Business hours are:  Monday to Thursday 8:00 AM to 4:00 PM Provider's Schedule: Francisco Naveira, MD - Appointments are:  Medication management: Monday and Wednesday 8:00 AM to 4:00 PM Procedure day: Tuesday and Thursday 7:30 AM to 4:00 PM Bilal Lateef, MD -  Appointments are:  Medication management: Tuesday and Thursday 8:00   AM to 4:00 PM Procedure day: Monday and Wednesday 7:30 AM to 4:00 PM (Last update: 09/01/2019) ____________________________________________________________________________________________  ____________________________________________________________________________________________  Pharmacy Shortages of Pain Medication   Introduction Shockingly as it may seem, .  "No U.[place] Supreme Court decision has ever interpreted the Constitution as guaranteeing a right to health care for all Americans." - https://huff.com/  "With respect to human rights, the [place] has no formally codified right to health, nor does it participate in a human rights treaty that specifies a right to health." - Scott J. Schweikart, JD, MBE  Situation By now, most of our patients have had the experience of being told by their pharmacist that they do not have enough medication to cover their prescription. If you have not had this experience, just know that you soon will.  Problem There appears to be a shortage of these medications, either at the national level or locally. This is happening with all pharmacies. When there is not enough medication, patients are offered a partial fill and they are told that they will try to get the rest of the medicine for them at a later time. If they do not have enough for even a partial fill, the pharmacists are telling the patients to call us (the prescribing physicians) to request that we send another prescription to another pharmacy to get the medicine.   This reordering of a controlled substance creates documentation problems where additional paperwork needs to be created to explain why two prescriptions for the same period of time and the same medicine are being prescribed to the same patient. It also creates situations where the last appointment note  does not accurately reflect when and what prescriptions were given to a patient. This leads to prescribing errors down the line, in subsequent follow-up visits.   [employer] of Pharmacy ([employer]) Research revealed that Surveyor, quantity .1806 (21 NCAC 46.1806) authorizes pharmacists to the transfer of prescriptions among pharmacies, and it sets forth procedural and recordkeeping requirements for doing so. However, this requires the pharmacist to complete the previously mentioned procedural paperwork to accomplish the transfer. As it turns out, it is much easier for them to have the prescribing physicians do the work.   Possible solutions 1. Have the [facility] Assembly add a provision to the "STOP ACT" (the law that mandates how controlled substances are prescribed) where there is an exception to the electronic prescribing rule that states that in the event there are shortages of medications the physicians are allowed to use written prescriptions as opposed to electronic ones. This would allow patients to take their prescriptions to a different pharmacy that may have enough medication available to fill the prescription. The problem is that currently there is a law that does not allow for written prescriptions, with the exception of instances where the electronic medical record is down due to technical issues.  2. Have [place] Congress ease the pressure on pharmaceutical companies, allowing them to produce enough quantities of the medication to adequately supply the population. 3. Have pharmacies keep enough stocks of these medications to cover their client base.  4. Have the [facility] Assembly add a provision to the "STOP ACT" where they ease the regulations surrounding the transfer of controlled substances between pharmacies, so as to simplify the transfer of supplies. As an alternative, develop a system to allow patients to obtain the remainder of their prescription at another  one of their pharmacies or at an associate pharmacy.   How  this shortage will affect you.  The one thing that is abundantly clear is that this is a pharmacy supply problem  and not a prescriber problem. The job of the prescriber is to evaluate and monitor the patients for the appropriate indications to the use of these medicines, the monitoring of their use and the prescribing of the appropriate dose and regimen. It is not the job of the prescriber to provide or dispense the actual medication. By law, this is the job of the pharmacies and pharmacists. It is certainly not the job of the prescriber to solve the supply problems.   Due to the above problems we are no longer taking patients to write for their pain medication. We will continue to evaluate for appropriate indications and we may provide recommendations regarding medication, dose, and schedule, as well as monitoring recommendations, however, we will not be taking over the actual prescribing of these substances. On those patients where we are treating their chronic pain with interventional therapies, exceptions will be considered on a case by case basis. At this time, we will try to continue providing this supplemental service to those patients we have been managing in the past. However, as of August 1st, 2023, we no longer will be sending additional prescriptions to other pharmacies for the purpose of solving their supply problems. Once we send a prescription to a pharmacy, we will not be resending it again to another pharmacy to cover for their shortages.   What to do. Write as many letters as you can. Recruit the help of family members in writing these letters. Below are some of the places where you can write to make your voice heard. Let them know what the problem is and push them to look for solutions.   Search internet for: "[employer] find your legislators" NoseSwap.is  Search internet for: "[employer] commissioner complaints" Starlas.fi  Search internet for: "[place] complaints" https://www.hernandez-brewer.com/.htm  Search internet for: "[facility] pharmacy complaints" Email [facility] Customer Relations woondaal.com.jsp?callType=store  Search internet for: Programme researcher, broadcasting/film/video customer service complaints" https://www.[facility].com/topic/marketing/contactus/contactus_customerservice.jsp  ____________________________________________________________________________________________  ____________________________________________________________________________________________  CBD (cannabidiol) & Delta-8 (Delta-8 tetrahydrocannabinol) WARNING  Intro: Cannabidiol (CBD) and tetrahydrocannabinol (THC), are two natural compounds found in plants of the Cannabis genus. They can both be extracted from hemp or cannabis. Hemp and cannabis come from the Cannabis sativa plant. Both compounds interact with your body's endocannabinoid system, but they have very different effects. CBD does not produce the high sensation associated with cannabis. Delta-8 tetrahydrocannabinol, also known as delta-8 THC, is a psychoactive substance found in the Cannabis sativa plant, of which marijuana and hemp are two varieties. THC is responsible for the high associated with the illicit use of marijuana.  Applicable to: All individuals currently taking or considering taking CBD (cannabidiol) and, more important, all patients taking opioid analgesic controlled substances (pain medication). (Example: oxycodone; oxymorphone; hydrocodone; hydromorphone; morphine; methadone; tramadol; tapentadol; fentanyl; buprenorphine; butorphanol; dextromethorphan; meperidine; codeine; etc.)  Legal status: CBD remains a Schedule I drug prohibited for any use. CBD is illegal with one exception. In the [place], CBD has a  limited Transport planner (FDA) approval for the treatment of two specific types of epilepsy disorders. Only one CBD product has been approved by the FDA for this purpose: "Epidiolex". FDA is aware that some companies are marketing products containing cannabis and cannabis-derived compounds in ways that violate the [employer], Drug and Cosmetic Act [facility] Act) and that may put the health and  safety of consumers at risk. The FDA, a Federal agency, has not enforced the CBD status since 2018. UPDATE: (03/30/2021) The Drug Enforcement Agency ([place]) issued a letter stating that "delta" cannabinoids, including Delta-8-THCO and Delta-9-THCO, synthetically derived from hemp do not qualify as hemp and will be viewed as Schedule I drugs. (Schedule I drugs, substances, or chemicals are defined as drugs with no currently accepted medical use and a high potential for abuse. Some examples of Schedule I drugs are: heroin, lysergic acid diethylamide (LSD), marijuana (cannabis), 3,4-methylenedioxymethamphetamine (ecstasy), methaqualone, and peyote.) (https://jennings.com/)  Legality: Some manufacturers ship CBD products nationally, which is illegal. Often such products are sold online and are therefore available throughout the country. CBD is openly sold in head shops and health food stores in some states where such sales have not been explicitly legalized. Selling unapproved products with unsubstantiated therapeutic claims is not only a violation of the law, but also can put patients at risk, as these products have not been proven to be safe or effective. Federal illegality makes it difficult to conduct research on CBD.  Reference: "FDA Regulation of Cannabis and Cannabis-Derived Products, Including Cannabidiol (CBD)" - SeekArtists.com.pt  Warning: CBD is not FDA approved and has not undergo the same  manufacturing controls as prescription drugs.  This means that the purity and safety of available CBD may be questionable. Most of the time, despite manufacturer's claims, it is contaminated with THC (delta-9-tetrahydrocannabinol - the chemical in marijuana responsible for the "HIGH").  When this is the case, the [facility]: Lambert Rhonda W contaminant will trigger a positive urine drug screen (UDS) test for Marijuana (carboxy-THC). Because a positive UDS for any illicit substance is a violation of our medication agreement, your opioid analgesics (pain medicine) may be permanently discontinued. The FDA recently put out a warning about 5 things that everyone should be aware of regarding Delta-8 THC: Delta-8 THC products have not been evaluated or approved by the FDA for safe use and may be marketed in ways that put the public health at risk. The FDA has received adverse event reports involving delta-8 THC-containing products. Delta-8 THC has psychoactive and intoxicating effects. Delta-8 THC manufacturing often involve use of potentially harmful chemicals to create the concentrations of delta-8 THC claimed in the marketplace. The final delta-8 THC product may have potentially harmful by-products (contaminants) due to the chemicals used in the process. Manufacturing of delta-8 THC products may occur in uncontrolled or unsanitary settings, which may lead to the presence of unsafe contaminants or other potentially harmful substances. Delta-8 THC products should be kept out of the reach of children and pets.  MORE ABOUT CBD  General Information: CBD was discovered in 5 and it is a derivative of the cannabis sativa genus plants (Marijuana and Hemp). It is one of the 113 identified substances found in Marijuana. It accounts for up to 40% of the plant's extract. As of 2018, preliminary clinical studies on CBD included research for the treatment of anxiety, movement disorders, and pain. CBD is available and consumed in multiple forms,  including inhalation of smoke or vapor, as an aerosol spray, and by mouth. It may be supplied as an oil containing CBD, capsules, dried cannabis, or as a liquid solution. CBD is thought not to be as psychoactive as THC (delta-9-tetrahydrocannabinol - the chemical in marijuana responsible for the "HIGH"). Studies suggest that CBD may interact with different biological target receptors in the body, including cannabinoid and other neurotransmitter receptors. As of 2018 the mechanism of action for its biological effects  has not been determined.  Side-effects  Adverse reactions: Dry mouth, diarrhea, decreased appetite, fatigue, drowsiness, malaise, weakness, sleep disturbances, and others.  Drug interactions: CBC may interact with other medications such as blood-thinners. Because CBD causes drowsiness on its own, it also increases the drowsiness caused by other medications, including antihistamines (such as Benadryl), benzodiazepines (Xanax, Ativan, Valium), antipsychotics, antidepressants and opioids, as well as alcohol and supplements such as kava, melatonin and St. John's Wort. Be cautious with the following combinations:   Brivaracetam (Briviact) Brivaracetam is changed and broken down by the body. CBD might decrease how quickly the body breaks down brivaracetam. This might increase levels of brivaracetam in the body.  Caffeine Caffeine is changed and broken down by the body. CBD might decrease how quickly the body breaks down caffeine. This might increase levels of caffeine in the body.  Carbamazepine (Tegretol) Carbamazepine is changed and broken down by the body. CBD might decrease how quickly the body breaks down carbamazepine. This might increase levels of carbamazepine in the body and increase its side effects.  Citalopram (Celexa) Citalopram is changed and broken down by the body. CBD might decrease how quickly the body breaks down citalopram. This might increase levels of citalopram in the  body and increase its side effects.  Clobazam (Onfi) Clobazam is changed and broken down by the liver. CBD might decrease how quickly the liver breaks down clobazam. This might increase the effects and side effects of clobazam.  Eslicarbazepine (Aptiom) Eslicarbazepine is changed and broken down by the body. CBD might decrease how quickly the body breaks down eslicarbazepine. This might increase levels of eslicarbazepine in the body by a small amount.  Everolimus (Zostress) Everolimus is changed and broken down by the body. CBD might decrease how quickly the body breaks down everolimus. This might increase levels of everolimus in the body.  Lithium Taking higher doses of CBD might increase levels of lithium. This can increase the risk of lithium toxicity.  Medications changed by the liver (Cytochrome P450 1A1 (CYP1A1) substrates) Some medications are changed and broken down by the liver. CBD might change how quickly the liver breaks down these medications. This could change the effects and side effects of these medications.  Medications changed by the liver (Cytochrome P450 1A2 (CYP1A2) substrates) Some medications are changed and broken down by the liver. CBD might change how quickly the liver breaks down these medications. This could change the effects and side effects of these medications.  Medications changed by the liver (Cytochrome P450 1B1 (CYP1B1) substrates) Some medications are changed and broken down by the liver. CBD might change how quickly the liver breaks down these medications. This could change the effects and side effects of these medications.  Medications changed by the liver (Cytochrome P450 2A6 (CYP2A6) substrates) Some medications are changed and broken down by the liver. CBD might change how quickly the liver breaks down these medications. This could change the effects and side effects of these medications.  Medications changed by the liver (Cytochrome P450 2B6  (CYP2B6) substrates) Some medications are changed and broken down by the liver. CBD might change how quickly the liver breaks down these medications. This could change the effects and side effects of these medications.  Medications changed by the liver (Cytochrome P450 2C19 (CYP2C19) substrates) Some medications are changed and broken down by the liver. CBD might change how quickly the liver breaks down these medications. This could change the effects and side effects of these medications.  Medications changed by the  liver (Cytochrome P450 2C8 (CYP2C8) substrates) Some medications are changed and broken down by the liver. CBD might change how quickly the liver breaks down these medications. This could change the effects and side effects of these medications.  Medications changed by the liver (Cytochrome P450 2C9 (CYP2C9) substrates) Some medications are changed and broken down by the liver. CBD might change how quickly the liver breaks down these medications. This could change the effects and side effects of these medications.  Medications changed by the liver (Cytochrome P450 2D6 (CYP2D6) substrates) Some medications are changed and broken down by the liver. CBD might change how quickly the liver breaks down these medications. This could change the effects and side effects of these medications.  Medications changed by the liver (Cytochrome P450 2E1 (CYP2E1) substrates) Some medications are changed and broken down by the liver. CBD might change how quickly the liver breaks down these medications. This could change the effects and side effects of these medications.  Medications changed by the liver (Cytochrome P450 3A4 (CYP3A4) substrates) Some medications are changed and broken down by the liver. CBD might change how quickly the liver breaks down these medications. This could change the effects and side effects of these medications.  Medications changed by the liver (Glucuronidated drugs) Some  medications are changed and broken down by the liver. CBD might change how quickly the liver breaks down these medications. This could change the effects and side effects of these medications.  Medications that decrease the breakdown of other medications by the liver (Cytochrome P450 2C19 (CYP2C19) inhibitors) CBD is changed and broken down by the liver. Some drugs decrease how quickly the liver changes and breaks down CBD. This could change the effects and side effects of CBD.  Medications that decrease the breakdown of other medications in the liver (Cytochrome P450 3A4 (CYP3A4) inhibitors) CBD is changed and broken down by the liver. Some drugs decrease how quickly the liver changes and breaks down CBD. This could change the effects and side effects of CBD.  Medications that increase breakdown of other medications by the liver (Cytochrome P450 3A4 (CYP3A4) inducers) CBD is changed and broken down by the liver. Some drugs increase how quickly the liver changes and breaks down CBD. This could change the effects and side effects of CBD.  Medications that increase the breakdown of other medications by the liver (Cytochrome P450 2C19 (CYP2C19) inducers) CBD is changed and broken down by the liver. Some drugs increase how quickly the liver changes and breaks down CBD. This could change the effects and side effects of CBD.  Methadone (Dolophine) Methadone is broken down by the liver. CBD might decrease how quickly the liver breaks down methadone. Taking cannabidiol along with methadone might increase the effects and side effects of methadone.  Rufinamide (Banzel) Rufinamide is changed and broken down by the body. CBD might decrease how quickly the body breaks down rufinamide. This might increase levels of rufinamide in the body by a small amount.  Sedative medications (CNS depressants) CBD might cause sleepiness and slowed breathing. Some medications, called sedatives, can also cause sleepiness and  slowed breathing. Taking CBD with sedative medications might cause breathing problems and/or too much sleepiness.  Sirolimus (Rapamune) Sirolimus is changed and broken down by the body. CBD might decrease how quickly the body breaks down sirolimus. This might increase levels of sirolimus in the body.  Stiripentol (Diacomit) Stiripentol is changed and broken down by the body. CBD might decrease how quickly the body breaks down  stiripentol. This might increase levels of stiripentol in the body and increase its side effects.  Tacrolimus (Prograf) Tacrolimus is changed and broken down by the body. CBD might decrease how quickly the body breaks down tacrolimus. This might increase levels of tacrolimus in the body.  Tamoxifen (Soltamox) Tamoxifen is changed and broken down by the body. CBD might affect how quickly the body breaks down tamoxifen. This might affect levels of tamoxifen in the body.  Topiramate (Topamax) Topiramate is changed and broken down by the body. CBD might decrease how quickly the body breaks down topiramate. This might increase levels of topiramate in the body by a small amount.  Valproate Valproic acid can cause liver injury. Taking cannabidiol with valproic acid might increase the chance of liver injury. CBD and/or valproic acid might need to be stopped, or the dose might need to be reduced.  Warfarin (Coumadin) CBD might increase levels of warfarin, which can increase the risk for bleeding. CBD and/or warfarin might need to be stopped, or the dose might need to be reduced.  Zonisamide Zonisamide is changed and broken down by the body. CBD might decrease how quickly the body breaks down zonisamide. This might increase levels of zonisamide in the body by a small amount. (Last update: 04/11/2021) ____________________________________________________________________________________________   ____________________________________________________________________________________________  Drug Holidays (Slow)  What is a "Drug Holiday"? Drug Holiday: is the name given to the period of time during which a patient stops taking a medication(s) for the purpose of eliminating tolerance to the drug.  Benefits Improved effectiveness of opioids. Decreased opioid dose needed to achieve benefits. Improved pain with lesser dose.  What is tolerance? Tolerance: is the progressive decreased in effectiveness of a drug due to its repetitive use. With repetitive use, the body gets use to the medication and as a consequence, it loses its effectiveness. This is a common problem seen with opioid pain medications. As a result, a larger dose of the drug is needed to achieve the same effect that used to be obtained with a smaller dose.  How long should a "Drug Holiday" last? You should stay off of the pain medicine for at least 14 consecutive days. (2 weeks)  Should I stop the medicine "cold turkey"? No. You should always coordinate with your Pain Specialist so that he/she can provide you with the correct medication dose to make the transition as smoothly as possible.  How do I stop the medicine? Slowly. You will be instructed to decrease the daily amount of pills that you take by one (1) pill every seven (7) days. This is called a "slow downward taper" of your dose. For example: if you normally take four (4) pills per day, you will be asked to drop this dose to three (3) pills per day for seven (7) days, then to two (2) pills per day for seven (7) days, then to one (1) per day for seven (7) days, and at the end of those last seven (7) days, this is when the "Drug Holiday" would start.   Will I have withdrawals? By doing a "slow downward taper" like this one, it is unlikely that you will experience any significant withdrawal symptoms. Typically, what triggers withdrawals is the sudden stop of a high dose  opioid therapy. Withdrawals can usually be avoided by slowly decreasing the dose over a prolonged period of time. If you do not follow these instructions and decide to stop your medication abruptly, withdrawals may be possible.  What are withdrawals? Withdrawals: refers to   the wide range of symptoms that occur after stopping or dramatically reducing opiate drugs after heavy and prolonged use. Withdrawal symptoms do not occur to patients that use low dose opioids, or those who take the medication sporadically. Contrary to benzodiazepine (example: Valium, Xanax, etc.) or alcohol withdrawals ("Delirium Tremens"), opioid withdrawals are not lethal. Withdrawals are the physical manifestation of the body getting rid of the excess receptors.  Expected Symptoms Early symptoms of withdrawal may include: Agitation Anxiety Muscle aches Increased tearing Insomnia Runny nose Sweating Yawning  Late symptoms of withdrawal may include: Abdominal cramping Diarrhea Dilated pupils Goose bumps Nausea Vomiting  Will I experience withdrawals? Due to the slow nature of the taper, it is very unlikely that you will experience any.  What is a slow taper? Taper: refers to the gradual decrease in dose.  (Last update: 09/01/2019) ____________________________________________________________________________________________   ______________________________________________________________________  Preparing for Procedure with Sedation  NOTICE: Due to recent regulatory changes, starting on September 11, 2020, procedures requiring intravenous (IV) sedation will no longer be performed at the [facility].  These types of procedures are required to be performed at [facility] ambulatory surgery facility.  We are very sorry for the inconvenience.  Procedure appointments are limited to planned procedures: No Prescription Refills. No disability issues will be discussed. No medication changes will be  discussed.  Instructions: Oral Intake: Do not eat or drink anything for at least 8 hours prior to your procedure. (Exception: Blood Pressure Medication. See below.) Transportation: A driver is required. You may not drive yourself after the procedure. Blood Pressure Medicine: Do not forget to take your blood pressure medicine with a sip of water the morning of the procedure. If your Diastolic (lower reading) is above 100 mmHg, elective cases will be cancelled/rescheduled. Blood thinners: These will need to be stopped for procedures. Notify our staff if you are taking any blood thinners. Depending on which one you take, there will be specific instructions on how and when to stop it. Diabetics on insulin: Notify the staff so that you can be scheduled 1st case in the morning. If your diabetes requires high dose insulin, take only  of your normal insulin dose the morning of the procedure and notify the staff that you have done so. Preventing infections: Shower with an antibacterial soap the morning of your procedure. Build-up your immune system: Take 1000 mg of Vitamin C with every meal (3 times a day) the day prior to your procedure. Antibiotics: Inform the staff if you have a condition or reason that requires you to take antibiotics before dental procedures. Pregnancy: If you are pregnant, call and cancel the procedure. Sickness: If you have a cold, fever, or any active infections, call and cancel the procedure. Arrival: You must be in the facility at least 30 minutes prior to your scheduled procedure. Children: Do not bring children with you. Dress appropriately: There is always the possibility that your clothing may get soiled. Valuables: Do not bring any jewelry or valuables.  Reasons to call and reschedule or cancel your procedure: (Following these recommendations will minimize the risk of a serious complication.) Surgeries: Avoid having procedures within 2 weeks of any surgery. (Avoid for 2  weeks before or after any surgery). Flu Shots: Avoid having procedures within 2 weeks of a flu shots. (Avoid for 2 weeks before or after immunizations). Barium: Avoid having a procedure within 7-10 days after having had a radiological study involving the use of radiological contrast. (Myelograms, Barium swallow or enema study). Heart  attacks: Avoid any elective procedures or surgeries for the initial 6 months after a "Myocardial Infarction" (Heart Attack). Blood thinners: It is imperative that you stop these medications before procedures. Let us know if you if you take any blood thinner.  Infection: Avoid procedures during or within two weeks of an infection (including chest colds or gastrointestinal problems). Symptoms associated with infections include: Localized redness, fever, chills, night sweats or profuse sweating, burning sensation when voiding, cough, congestion, stuffiness, runny nose, sore throat, diarrhea, nausea, vomiting, cold or Flu symptoms, recent or current infections. It is specially important if the infection is over the area that we intend to treat. Heart and lung problems: Symptoms that may suggest an active cardiopulmonary problem include: cough, chest pain, breathing difficulties or shortness of breath, dizziness, ankle swelling, uncontrolled high or unusually low blood pressure, and/or palpitations. If you are experiencing any of these symptoms, cancel your procedure and contact your primary care physician for an evaluation.  Remember:  Regular Business hours are:  Monday to Thursday 8:00 AM to 4:00 PM  Provider's Schedule: Milinda Pointer, MD:  Procedure days: Tuesday and Thursday 7:30 AM to 4:00 PM  Gillis Santa, MD:  Procedure days: Monday and Wednesday 7:30 AM to 4:00 PM ______________________________________________________________________  ____________________________________________________________________________________________  General Risks and Possible  Complications  Patient Responsibilities: It is important that you read this as it is part of your informed consent. It is our duty to inform you of the risks and possible complications associated with treatments offered to you. It is your responsibility as a patient to read this and to ask questions about anything that is not clear or that you believe was not covered in this document.  Patient's Rights: You have the right to refuse treatment. You also have the right to change your mind, even after initially having agreed to have the treatment done. However, under this last option, if you wait until the last second to change your mind, you may be charged for the materials used up to that point.  Introduction: Medicine is not an Chief Strategy Officer. Everything in Medicine, including the lack of treatment(s), carries the potential for danger, harm, or loss (which is by definition: Risk). In Medicine, a complication is a secondary problem, condition, or disease that can aggravate an already existing one. All treatments carry the risk of possible complications. The fact that a side effects or complications occurs, does not imply that the treatment was conducted incorrectly. It must be clearly understood that these can happen even when everything is done following the highest safety standards.  No treatment: You can choose not to proceed with the proposed treatment alternative. The "PRO(s)" would include: avoiding the risk of complications associated with the therapy. The "CON(s)" would include: not getting any of the treatment benefits. These benefits fall under one of three categories: diagnostic; therapeutic; and/or palliative. Diagnostic benefits include: getting information which can ultimately lead to improvement of the disease or symptom(s). Therapeutic benefits are those associated with the successful treatment of the disease. Finally, palliative benefits are those related to the decrease of the primary  symptoms, without necessarily curing the condition (example: decreasing the pain from a flare-up of a chronic condition, such as incurable terminal cancer).  General Risks and Complications: These are associated to most interventional treatments. They can occur alone, or in combination. They fall under one of the following six (6) categories: no benefit or worsening of symptoms; bleeding; infection; nerve damage; allergic reactions; and/or death. No benefits or worsening of  symptoms: In Medicine there are no guarantees, only probabilities. No healthcare provider can ever guarantee that a medical treatment will work, they can only state the probability that it may. Furthermore, there is always the possibility that the condition may worsen, either directly, or indirectly, as a consequence of the treatment. Bleeding: This is more common if the patient is taking a blood thinner, either prescription or over the counter (example: Goody Powders, Fish oil, Aspirin, Garlic, etc.), or if suffering a condition associated with impaired coagulation (example: Hemophilia, cirrhosis of the liver, low platelet counts, etc.). However, even if you do not have one on these, it can still happen. If you have any of these conditions, or take one of these drugs, make sure to notify your treating physician. Infection: This is more common in patients with a compromised immune system, either due to disease (example: diabetes, cancer, human immunodeficiency virus [HIV], etc.), or due to medications or treatments (example: therapies used to treat cancer and rheumatological diseases). However, even if you do not have one on these, it can still happen. If you have any of these conditions, or take one of these drugs, make sure to notify your treating physician. Nerve Damage: This is more common when the treatment is an invasive one, but it can also happen with the use of medications, such as those used in the treatment of cancer. The damage  can occur to small secondary nerves, or to large primary ones, such as those in the spinal cord and brain. This damage may be temporary or permanent and it may lead to impairments that can range from temporary numbness to permanent paralysis and/or brain death. Allergic Reactions: Any time a substance or material comes in contact with our body, there is the possibility of an allergic reaction. These can range from a mild skin rash (contact dermatitis) to a severe systemic reaction (anaphylactic reaction), which can result in death. Death: In general, any medical intervention can result in death, most of the time due to an unforeseen complication. ____________________________________________________________________________________________ Preparing for your procedure (without sedation) Instructions: Oral Intake: Do not eat or drink anything for at least 3 hours prior to your procedure. Transportation: Unless otherwise stated by your physician, you may drive yourself after the procedure. Blood Pressure Medicine: Take your blood pressure medicine with a sip of water the morning of the procedure. Insulin: Take only  of your normal insulin dose. Preventing infections: Shower with an antibacterial soap the morning of your procedure. Build-up your immune system: Take 1000 mg of Vitamin C with every meal (3 times a day) the day prior to your procedure. Pregnancy: If you are pregnant, call and cancel the procedure. Sickness: If you have a cold, fever, or any active infections, call and cancel the procedure. Arrival: You must be in the facility at least 30 minutes prior to your scheduled procedure. Children: Do not bring any children with you. Dress appropriately: Bring dark clothing that you would not mind if they get stained. Valuables: Do not bring any jewelry or valuables. Procedure appointments are reserved for interventional treatments only. No Prescription Refills. No medication changes will be  discussed during procedure appointments. No disability issues will be discussed.

## 2021-08-27 NOTE — Chronic Care Management (AMB) (Signed)
Chronic Care Management CCM Pharmacy Note  08/27/2021 Name:  TREVAN MESSMAN MRN:  [id number] DOB:  [birth year]   Subjective: Charles Ewing is an 81 y.o. year old male who is a primary patient of Olin Hauser, DO.  The CCM team was consulted for assistance with disease management and care coordination needs.    Engaged with patient by telephone for follow up visit for pharmacy case management and/or care coordination services.   Objective:  Medications Reviewed Today     Reviewed by Rennis Petty, RPH-CPP (Pharmacist) on 08/27/21 at 1628  Med List Status: <None>   Medication Order Taking? Sig Documenting Provider Last Dose Status Informant  albuterol (VENTOLIN HFA) 108 (90 Base) MCG/ACT inhaler [id number] Yes INHALE 2 PUFFS INTO THE LUNGS EVERY 6 HOURS AS NEEDED FOR WHEEZING OR SHORTNESS OF BREATH. Olin Hauser, DO Taking Active Pharmacy Records  amLODipine [facility]) 5 MG tablet [id number] Yes TAKE 1 TABLET BY MOUTH DAILY Olin Hauser, DO Taking Active   aspirin EC 81 MG tablet [id number] Yes Take 162 mg by mouth every evening. [provider] Taking Active Pharmacy Records           Med Note Curley Spice, [facility] A   Mon Aug 27, 2021  2:19 PM)    Cholecalciferol (VITAMIN D3) 125 MCG (5000 UT) CAPS [id number] Yes Take 1 capsule (5,000 Units total) by mouth daily with breakfast. Take along with calcium and magnesium. Milinda Pointer, MD Taking Active   FLUoxetine (PROZAC) 20 MG capsule [id number] Yes Take 1 capsule (20 mg total) by mouth 2 (two) times daily. Olin Hauser, DO Taking Active Pharmacy Records           Med Note [facility], Marvis Repress Dec 27, 2020  1:52 PM)    fluticasone [facility]) 50 MCG/ACT nasal spray [id number] Yes PLACE 2 SPRAYS INTO BOTH NOSTRILS DAILY  Patient taking differently: Place 2 sprays into both nostrils daily as needed.   Olin Hauser, DO Taking Active Pharmacy Records  furosemide (LASIX)  20 MG tablet [id number] Yes Take 1 tablet (20 mg total) by mouth every other day. Olin Hauser, DO Taking Active   gabapentin (NEURONTIN) 100 MG capsule [id number] Yes START 1 CAPSULE BY MOUTH DAILY, INCREASEBY 1 CAPSULE EVERY 2 TO 3 DAYS AS TOLERATED UP TO 3 TIMES A DAY, OR MAY TAKE 3 AT ONCE IN THE [place], Devonne Doughty, DO Taking Active            Med Note Bretta Bang Aug 27, 2021  2:32 PM) Taking 1 capsule three times daily  Lancets [facility] ULTRASOFT) lancets [id number]  Use as instructed Olin Hauser, DO  Active Pharmacy Records  LORazepam (ATIVAN) 0.5 MG tablet [id number] Yes TAKE 1 TABLET BY MOUTH AT BEDTIME AS NEEDED FOR ANXIETY Olin Hauser, DO Taking Active   losartan (COZAAR) 25 MG tablet [id number] Yes TAKE 1 TABLET BY MOUTH DAILY Olin Hauser, DO Taking Active   meloxicam (MOBIC) 15 MG tablet [id number] Yes TAKE 1 TABLET BY MOUTH DAILY AS NEEDED FOR PAIN Olin Hauser, DO Taking Active   oxyCODONE (OXY IR/ROXICODONE) 5 MG immediate release tablet [id number] Yes Take 1 tablet (5 mg total) by mouth 2 (two) times daily as needed for severe pain. Each refill must last 30 days. Milinda Pointer, MD Taking Active   oxyCODONE (OXY IR/ROXICODONE) 5 MG immediate release tablet [id number]  Take 1 tablet (5 mg total) by  mouth 2 (two) times daily as needed for severe pain. Each refill must last 30 days. Milinda Pointer, MD  Active   oxyCODONE (OXY IR/ROXICODONE) 5 MG immediate release tablet [id number]  Take 1 tablet (5 mg total) by mouth 2 (two) times daily as needed for severe pain. Each refill must last 30 days. Milinda Pointer, MD  Active            Med Note Dossie Arbour, [place] A   Mon Aug 27, 2021 10:55 AM) WARNING: Not a Duplicate. Future prescription. DO NOT DELETE during hospital medication reconciliation or at discharge. [facility] Chronic Pain Management Patient   pantoprazole (PROTONIX) 20 MG tablet [id number]  Yes Take 1 tablet by mouth daily. [provider] Taking Active   potassium chloride (MICRO-K) 10 MEQ CR capsule [id number] Yes Take 1 capsule (10 mEq total) by mouth every other day. Olin Hauser, DO Taking Active   simvastatin (ZOCOR) 40 MG tablet [id number] Yes Take 1 tablet (40 mg total) by mouth at bedtime.  Patient taking differently: Take 20 mg by mouth at bedtime.   Olin Hauser, DO Taking Active   tamsulosin (FLOMAX) 0.4 MG CAPS capsule [id number] Yes TAKE 1 CAPSULE BY MOUTH DAILY AFTER BREAKFAST Karamalegos, Devonne Doughty, DO Taking Active   Tiotropium Bromide Monohydrate (SPIRIVA RESPIMAT) 2.5 MCG/ACT AERS [id number] Yes Inhale 2 each into the lungs daily. [provider] Taking Active   Med List Note Dewayne Shorter, RN 08/27/21 1110): Mr 12-01-2021            Pertinent Labs:   Lab Results  Component Value Date   HGBA1C 5.8 (H) 10/30/2020   Lab Results  Component Value Date   CHOL 96 10/30/2020   HDL 40 10/30/2020   LDLCALC 39 10/30/2020   TRIG 87 10/30/2020   CHOLHDL 2.4 10/30/2020   Lab Results  Component Value Date   CREATININE 1.18 06/13/2021   BUN 18 06/13/2021   NA 138 06/13/2021   K 4.6 06/13/2021   CL 102 06/13/2021   CO2 26 12/23/2020   Lab Results  Component Value Date   VITAMINB12 142 (L) 06/13/2021   BP Readings from Last 3 Encounters:  08/27/21 133/68  08/09/21 108/64  08/06/21 (!) 94/56   Pulse Readings from Last 3 Encounters:  08/27/21 67  08/09/21 78  08/06/21 76     SDOH:  (Social Determinants of Health) assessments and interventions performed:    [place]  Review of patient past medical history, allergies, medications, health status, including review of consultants reports, laboratory and other test data, was performed as part of comprehensive evaluation and provision of chronic care management services.     Care Plan : PharmD - Medication Adherence/Management  Updates made by Rennis Petty, RPH-CPP since 08/27/2021 12:00 AM     Problem: Disease Progression      Long-Range Goal: Disease Progression Prevented or Minimized   Start Date: 08/27/2021  Expected End Date: 11/25/2021  Priority: High  Note:   Current Barriers:  Chronic Disease Management support and education needs related to HTN, HLD, COPD, Chronic Pain and GERD  Pharmacist Clinical Goal(s):  patient will achieve adherence to monitoring guidelines and medication adherence to achieve therapeutic efficacy through collaboration with PharmD and provider.    Interventions: 1:1 collaboration with Olin Hauser, DO regarding development and update of comprehensive plan of care as evidenced by provider attestation and co-signature Inter-disciplinary care team collaboration (see longitudinal plan of care) Perform chart review Office  Visit with [facility] today Office Visit with PCP on 6/26. Provider advised patient:  Can switch to every OTHER day dosing. Lasix '20mg'$  + Potassium 10 mEq. If develop worse swelling may need regularly again Finish the WEEKLY Vitamin D 50,000 unit - 3 pills left, after the weekly doses are done, then you can start the DAILY Vitamin D rx 5,000 unit once a day Call back to schedule follow up with Cardiologist at [facility] Note Office visit with [facility] Cardiology now scheduled for 8/2 Comprehensive medication review performed; medication list updated in electronic medical record Confirms completed course of WEEKLY Vitamin D 50,000 Rx and now taking the DAILY Vitamin D rx 5,000 unit once a day Caution patient for risk of dizziness, sedation and increased risk of falls with medications including gabapentin, lorazepam, oxycodone, particularly when taken in combination Patient reports tolerating these medications well now, denies difficulty with dizziness, sedation or recent falls. Reports using a cane for additional support Denies difficulty with  dry mouth (using rinse from [facility] specialist, see below), constipation or difficulty with swallowing medications, including his potassium Potential drug-drug interaction: amlodipine may increase the serum concentration of simvastatin. Max dose of simvastatin: 20 mg daily if coadministering with amlodipine Patient reports that he takes his simvastatin 40 mg -  tablet (20 mg) daily. Reports he has been taking his simvastatin this way for >10 years and tolerating well From review of latest lipid panel, note LDL controlled Offer to adjust patient's simvastatin Rx to simvastatin 20 mg tablet - 1 tablet daily to ease administration burden/accuracy. Patient is interested CPP sends Rx for simvastatin 20 mg daily to pharmacy for patient Patient to use caution with use of meloxicam given age and renal function. Confirms using as needed Per patient/from review of chart, note he previously had a low Vitamin B12 level on 06/13/2021 and pain management provider prescribed 30 days of Vitamin B12 5,000 mcg SL daily. Patient wonders if he should take further Vitamin B12  Will collaborate with PCP regarding whether patient would benefit from Vitamin B12 500 mcg daily for prevention Reports his son lives with him and provides support  Medication Adherence: Reports uses weekly pillbox to organize his medications Encourage patient to obtain and start using a 2nd weekly pillbox (through health plan over the counter benefit) for evening medications Reports medications affordable through [facility] Dual Complete coverage, but also uses [place] (for Spiriva inhaler) Identify patient in need of refill of Spiriva inhaler. Confirms has contacted [facility] to request a refill  COPD: Current treatment: Spiriva Respimat 2.5 mcg - 2 puffs daily (filled through [facility]) Reports used to have an albuterol rescue inhaler, but needs a renewal CPP will send renewal of albuterol Rx to pharmacy today Reports uses Spiriva  maintenance inhaler daily as directed Reports also uses a daily mouth rinse from [place] [facility] daily to prevent dry mouth   Hypertension: Current treatment: Amlodipine 5 mg daily Losartan 25 mg daily Furosemide 20 mg every other day Potassium chloride 10 meq every other day Reports has home upper arm blood pressure monitor and checks home BP Reports last checked blood pressure today (office visit reading): 133/68, HR 67 Denies symptoms of hypotension Denies any s/s of swelling  Tobacco use: Currently smokes ~1 pack/day Counsel on benefits of smoking cessation Denies interest in quitting at this time  Patient Goals/Self-Care Activities patient will:  - take medications as prescribed as evidenced by patient report and record review - focus on  medication adherence by using AM and PM weekly pillboxes - check blood pressure, document, and provide at future appointments - attend medical appointments as scheduled  Office visit with [facility] Cardiology now scheduled for 8/2        Plan: Telephone follow up appointment with care management team member scheduled for:  09/24/2021 at 10:45 am  Wallace Cullens, PharmD, Para [place], [facility] [phone]

## 2021-08-29 ENCOUNTER — Ambulatory Visit: Payer: Medicaid Other | Admitting: Pharmacist

## 2021-08-29 DIAGNOSIS — E538 Deficiency of other specified B group vitamins: Secondary | ICD-10-CM

## 2021-08-29 DIAGNOSIS — E785 Hyperlipidemia, unspecified: Secondary | ICD-10-CM

## 2021-08-29 NOTE — Chronic Care Management (AMB) (Signed)
Chronic Care Management CCM Pharmacy Note  08/29/2021 Name:  Charles Ewing MRN:  [id number] DOB:  15-Oct-1940  Subjective: Charles Ewing is an 81 y.o. year old male who is a primary patient of Olin Hauser, DO.  The CCM team was consulted for assistance with disease management and care coordination needs.    Engaged with patient by telephone for follow up visit for pharmacy case management and/or care coordination services.   Objective:  Medications Reviewed Today     Reviewed by Rennis Petty, RPH-CPP (Pharmacist) on 08/27/21 at 1628  Med List Status: <None>   Medication Order Taking? Sig Documenting Provider Last Dose Status Informant  albuterol (VENTOLIN HFA) 108 (90 Base) MCG/ACT inhaler [id number] Yes INHALE 2 PUFFS INTO THE LUNGS EVERY 6 HOURS AS NEEDED FOR WHEEZING OR SHORTNESS OF BREATH. Olin Hauser, DO Taking Active Pharmacy Records  amLODipine [facility]) 5 MG tablet [id number] Yes TAKE 1 TABLET BY MOUTH DAILY Olin Hauser, DO Taking Active   aspirin EC 81 MG tablet [id number] Yes Take 162 mg by mouth every evening. [provider] Taking Active Pharmacy Records           Med Note Curley Spice, [facility] A   Mon Aug 27, 2021  2:19 PM)    Cholecalciferol (VITAMIN D3) 125 MCG (5000 UT) CAPS [id number] Yes Take 1 capsule (5,000 Units total) by mouth daily with breakfast. Take along with calcium and magnesium. Milinda Pointer, MD Taking Active   FLUoxetine (PROZAC) 20 MG capsule [id number] Yes Take 1 capsule (20 mg total) by mouth 2 (two) times daily. Olin Hauser, DO Taking Active Pharmacy Records           Med Note [facility], Marvis Repress Dec 27, 2020  1:52 PM)    fluticasone [facility]) 50 MCG/ACT nasal spray [id number] Yes PLACE 2 SPRAYS INTO BOTH NOSTRILS DAILY  Patient taking differently: Place 2 sprays into both nostrils daily as needed.   Olin Hauser, DO Taking Active Pharmacy Records  furosemide (LASIX)  20 MG tablet [id number] Yes Take 1 tablet (20 mg total) by mouth every other day. Olin Hauser, DO Taking Active   gabapentin (NEURONTIN) 100 MG capsule [id number] Yes START 1 CAPSULE BY MOUTH DAILY, INCREASEBY 1 CAPSULE EVERY 2 TO 3 DAYS AS TOLERATED UP TO 3 TIMES A DAY, OR MAY TAKE 3 AT ONCE IN THE [place], Devonne Doughty, DO Taking Active            Med Note Bretta Bang Aug 27, 2021  2:32 PM) Taking 1 capsule three times daily  Lancets [facility] ULTRASOFT) lancets [id number]  Use as instructed Olin Hauser, DO  Active Pharmacy Records  LORazepam (ATIVAN) 0.5 MG tablet [id number] Yes TAKE 1 TABLET BY MOUTH AT BEDTIME AS NEEDED FOR ANXIETY Olin Hauser, DO Taking Active   losartan (COZAAR) 25 MG tablet [id number] Yes TAKE 1 TABLET BY MOUTH DAILY Olin Hauser, DO Taking Active   meloxicam (MOBIC) 15 MG tablet [id number] Yes TAKE 1 TABLET BY MOUTH DAILY AS NEEDED FOR PAIN Olin Hauser, DO Taking Active   oxyCODONE (OXY IR/ROXICODONE) 5 MG immediate release tablet [id number] Yes Take 1 tablet (5 mg total) by mouth 2 (two) times daily as needed for severe pain. Each refill must last 30 days. Milinda Pointer, MD Taking Active   oxyCODONE (OXY IR/ROXICODONE) 5 MG immediate release tablet [id number]  Take 1 tablet (5 mg total) by mouth  2 (two) times daily as needed for severe pain. Each refill must last 30 days. Milinda Pointer, MD  Active   oxyCODONE (OXY IR/ROXICODONE) 5 MG immediate release tablet [id number]  Take 1 tablet (5 mg total) by mouth 2 (two) times daily as needed for severe pain. Each refill must last 30 days. Milinda Pointer, MD  Active            Med Note Dossie Arbour, [place] A   Mon Aug 27, 2021 10:55 AM) WARNING: Not a Duplicate. Future prescription. DO NOT DELETE during hospital medication reconciliation or at discharge. [facility] Chronic Pain Management Patient   pantoprazole (PROTONIX) 20 MG tablet [id number]  Yes Take 1 tablet by mouth daily. [provider] Taking Active   potassium chloride (MICRO-K) 10 MEQ CR capsule [id number] Yes Take 1 capsule (10 mEq total) by mouth every other day. Olin Hauser, DO Taking Active   simvastatin (ZOCOR) 40 MG tablet [id number] Yes Take 1 tablet (40 mg total) by mouth at bedtime.  Patient taking differently: Take 20 mg by mouth at bedtime.   Olin Hauser, DO Taking Active   tamsulosin (FLOMAX) 0.4 MG CAPS capsule [id number] Yes TAKE 1 CAPSULE BY MOUTH DAILY AFTER BREAKFAST Karamalegos, Devonne Doughty, DO Taking Active   Tiotropium Bromide Monohydrate (SPIRIVA RESPIMAT) 2.5 MCG/ACT AERS [id number] Yes Inhale 2 each into the lungs daily. [provider] Taking Active   Med List Note Dewayne Shorter, RN 08/27/21 1110): Mr 12-01-2021            Pertinent Labs:   Lab Results  Component Value Date   CHOL 96 10/30/2020   HDL 40 10/30/2020   LDLCALC 39 10/30/2020   TRIG 87 10/30/2020   CHOLHDL 2.4 10/30/2020   Lab Results  Component Value Date   CREATININE 1.18 06/13/2021   BUN 18 06/13/2021   NA 138 06/13/2021   K 4.6 06/13/2021   CL 102 06/13/2021   CO2 26 12/23/2020    SDOH:  (Social Determinants of Health) assessments and interventions performed:    CCM Care Plan  Review of patient past medical history, allergies, medications, health status, including review of consultants reports, laboratory and other test data, was performed as part of comprehensive evaluation and provision of chronic care management services.   Care Plan : PharmD - Medication Adherence/Management  Updates made by Rennis Petty, RPH-CPP since 08/29/2021 12:00 AM     Problem: Disease Progression      Long-Range Goal: Disease Progression Prevented or Minimized   Start Date: 08/27/2021  Expected End Date: 11/25/2021  Priority: High  Note:   Current Barriers:  Chronic Disease Management support and education needs related to HTN,  HLD, COPD, Chronic Pain and GERD  Pharmacist Clinical Goal(s):  patient will achieve adherence to monitoring guidelines and medication adherence to achieve therapeutic efficacy through collaboration with PharmD and provider.    Interventions: 1:1 collaboration with Olin Hauser, DO regarding development and update of comprehensive plan of care as evidenced by provider attestation and co-signature Inter-disciplinary care team collaboration (see longitudinal plan of care) Per review of chart on 7/17, noted patient had a low Vitamin B12 level on 06/13/2021 and pain management provider prescribed 30 days of Vitamin B12 5,000 mcg SL daily. Patient asked if he should take further Vitamin B12  Lab Results  Component Value Date   VITAMINB12 142 (L) 06/13/2021  Collaborated with PCP, who recommended repeat Vitamin B12 lab Follow up with patient by telephone today and schedule  lab appointment Today patient confirms picked up Rx for albuterol inhaler Confirms picked up Rx for simvastatin 20 mg. States will continue to take his simvastatin 40 mg -  tablet (20 mg) daily, until has used up this supply and then will start the simvastatin 20 mg - taking 1 tablet daily  Patient Goals/Self-Care Activities patient will:  - take medications as prescribed as evidenced by patient report and record review - focus on medication adherence by using AM and PM weekly pillboxes - check blood pressure, document, and provide at future appointments - attend medical appointments as scheduled  Office visit with [facility] now scheduled for 8/2       Plan:  1) Patient scheduled for lab appointment at [facility] on 7/21 at 11 am 2) Telephone follow up appointment with care management team member scheduled for:  09/24/2021 at 10:45 AM  Wallace Cullens, PharmD, Para [place], [place] [phone]

## 2021-08-29 NOTE — Patient Instructions (Signed)
Visit Information  Thank you for taking time to visit with me today. Please don't hesitate to contact me if I can be of assistance to you before our next scheduled telephone appointment.  Following are the goals we discussed today:   Goals Addressed             This Visit's Progress    Pharmacy Goals       Please check your home blood pressure, keep a log of the results and bring this with you to your medical appointments.  Our goal bad cholesterol, or LDL, is less than 70 . This is why it is important to continue taking your simvastatin 20 mg daily.  Thank you!   Wallace Cullens, PharmD, Para [place], CPP Clinical Pharmacist [facility] [phone]          Our next appointment is by telephone on 09/24/2021 at 10:45 AM  Please call the care guide team at [phone] if you need to cancel or reschedule your appointment.    The patient verbalized understanding of instructions, educational materials, and care plan provided today and DECLINED offer to receive copy of patient instructions, educational materials, and care plan.

## 2021-08-31 ENCOUNTER — Other Ambulatory Visit: Payer: Medicaid Other

## 2021-08-31 DIAGNOSIS — E538 Deficiency of other specified B group vitamins: Secondary | ICD-10-CM

## 2021-09-01 LAB — VITAMIN B12: Vitamin B-12: 1535 pg/mL — ABNORMAL HIGH (ref 200–1100)

## 2021-09-10 DIAGNOSIS — J432 Centrilobular emphysema: Secondary | ICD-10-CM | POA: Diagnosis not present

## 2021-09-10 DIAGNOSIS — F32A Depression, unspecified: Secondary | ICD-10-CM

## 2021-09-10 DIAGNOSIS — I2510 Atherosclerotic heart disease of native coronary artery without angina pectoris: Secondary | ICD-10-CM | POA: Diagnosis not present

## 2021-09-10 DIAGNOSIS — N183 Chronic kidney disease, stage 3 unspecified: Secondary | ICD-10-CM | POA: Diagnosis not present

## 2021-09-10 DIAGNOSIS — E785 Hyperlipidemia, unspecified: Secondary | ICD-10-CM | POA: Diagnosis not present

## 2021-09-10 DIAGNOSIS — I129 Hypertensive chronic kidney disease with stage 1 through stage 4 chronic kidney disease, or unspecified chronic kidney disease: Secondary | ICD-10-CM

## 2021-09-12 NOTE — Progress Notes (Unsigned)
PROVIDER NOTE: Interpretation of information contained herein should be left to medically-trained personnel. Specific patient instructions are provided elsewhere under "Patient Instructions" section of medical record. This document was created in part using STT-dictation technology, any transcriptional errors that may result from this process are unintentional.  Patient: Charles Ewing Type: Established DOB: [birth year] MRN: [id number] PCP: Olin Hauser, DO  Service: Procedure DOS: 09/13/2021 Setting: Ambulatory Location: Ambulatory outpatient facility Delivery: Face-to-face Provider: Gaspar Cola, MD Specialty: Interventional Pain Management Specialty designation: 09 Location: Outpatient facility Ref. Prov.: Milinda Pointer, MD    Procedure:           Type: Lumbar Facet, Medial Branch Block(s) #2  Laterality: Right  Level: L2, L3, L4, L5, & S1 Medial Branch Level(s). Injecting these levels blocks the L3-4, L4-5 and L5-S1 lumbar facet joints. Imaging: Fluoroscopic guidance         Anesthesia: Local anesthesia (1-2% Lidocaine) Anxiolysis: None                Sedation: None. DOS: 09/13/2021 Performed by: Gaspar Cola, MD  Primary Purpose: Diagnostic/Therapeutic Indications: Low back pain severe enough to impact quality of life or function. 1. Lumbar facet syndrome   2. Spondylosis without myelopathy or radiculopathy, lumbosacral region   3. DDD (degenerative disc disease), lumbosacral   4. Chronic low back pain (1ry area of Pain) (Bilateral) (R>L) w/o sciatica   5. Chronic hip pain (2ry area of Pain) (Right)   6. Dextroscoliosis of lumbar spine    NAS-11 Pain score:   Pre-procedure: 7 /10   Post-procedure: 0-No pain/10     Position / Prep / Materials:  Position: Prone  Prep solution: DuraPrep (Iodine Povacrylex [0.7% available iodine] and Isopropyl Alcohol, 74% w/w) Area Prepped: Posterolateral Lumbosacral Spine (Wide prep: From the lower border of  the scapula down to the end of the tailbone and from flank to flank.)  Materials:  Tray: Block Needle(s):  Type: Spinal  Gauge (G): 22  Length: 5-in Qty: 4  Pre-op H&P Assessment:  Charles Ewing is a 81 y.o. (year old), male patient, seen today for interventional treatment. He  has a past surgical history that includes Skin cancer excision; Colon polyp surgery; Transurethral resection of prostate (2010); Cataract extraction w/PHACO (Left, 05/09/2020); and Cataract extraction w/PHACO (Right, 05/23/2020). Charles Ewing has a current medication list which includes the following prescription(s): albuterol, amlodipine, aspirin ec, vitamin d3, fluoxetine, fluticasone, furosemide, gabapentin, lorazepam, losartan, meloxicam, oxycodone, [START ON 10/02/2021] oxycodone, [START ON 11/01/2021] oxycodone, pantoprazole, potassium chloride, simvastatin, tamsulosin, onetouch ultrasoft, and spiriva respimat, and the following Facility-Administered Medications: pentafluoroprop-tetrafluoroeth. His primarily concern today is the Back Pain (Lumbar right )  Initial Vital Signs:  Pulse/HCG Rate: (!) 58ECG Heart Rate: 67 Temp: (!) 97.2 F (36.2 C) Resp: 16 BP: 126/73 SpO2: 97 %  BMI: Estimated body mass index is 26.5 kg/m as calculated from the following:   Height as of this encounter: '5\' 11"'$  (1.803 m).   Weight as of this encounter: 190 lb (86.2 kg).  Risk Assessment: Allergies: Reviewed. He is allergic to nexium [esomeprazole magnesium].  Allergy Precautions: None required Coagulopathies: Reviewed. None identified.  Blood-thinner therapy: None at this time Active Infection(s): Reviewed. None identified. Charles Ewing is afebrile  Site Confirmation: Charles Ewing was asked to confirm the procedure and laterality before marking the site Procedure checklist: Completed Consent: Before the procedure and under the influence of no sedative(s), amnesic(s), or anxiolytics, the patient was informed of the treatment options, risks  and possible  complications. To fulfill our ethical and legal obligations, as recommended by the American Medical Association's Code of Ethics, I have informed the patient of my clinical impression; the nature and purpose of the treatment or procedure; the risks, benefits, and possible complications of the intervention; the alternatives, including doing nothing; the risk(s) and benefit(s) of the alternative treatment(s) or procedure(s); and the risk(s) and benefit(s) of doing nothing. The patient was provided information about the general risks and possible complications associated with the procedure. These may include, but are not limited to: failure to achieve desired goals, infection, bleeding, organ or nerve damage, allergic reactions, paralysis, and death. In addition, the patient was informed of those risks and complications associated to Spine-related procedures, such as failure to decrease pain; infection (i.e.: Meningitis, epidural or intraspinal abscess); bleeding (i.e.: epidural hematoma, subarachnoid hemorrhage, or any other type of intraspinal or peri-dural bleeding); organ or nerve damage (i.e.: Any type of peripheral nerve, nerve root, or spinal cord injury) with subsequent damage to sensory, motor, and/or autonomic systems, resulting in permanent pain, numbness, and/or weakness of one or several areas of the body; allergic reactions; (i.e.: anaphylactic reaction); and/or death. Furthermore, the patient was informed of those risks and complications associated with the medications. These include, but are not limited to: allergic reactions (i.e.: anaphylactic or anaphylactoid reaction(s)); adrenal axis suppression; blood sugar elevation that in diabetics may result in ketoacidosis or comma; water retention that in patients with history of congestive heart failure may result in shortness of breath, pulmonary edema, and decompensation with resultant heart failure; weight gain; swelling or edema;  medication-induced neural toxicity; particulate matter embolism and blood vessel occlusion with resultant organ, and/or nervous system infarction; and/or aseptic necrosis of one or more joints. Finally, the patient was informed that Medicine is not an exact science; therefore, there is also the possibility of unforeseen or unpredictable risks and/or possible complications that may result in a catastrophic outcome. The patient indicated having understood very clearly. We have given the patient no guarantees and we have made no promises. Enough time was given to the patient to ask questions, all of which were answered to the patient's satisfaction. Mr. Mcnab has indicated that he wanted to continue with the procedure. Attestation: I, the ordering provider, attest that I have discussed with the patient the benefits, risks, side-effects, alternatives, likelihood of achieving goals, and potential problems during recovery for the procedure that I have provided informed consent. Date  Time: 09/13/2021 10:47 AM  Pre-Procedure Preparation:  Monitoring: As per clinic protocol. Respiration, ETCO2, SpO2, BP, heart rate and rhythm monitor placed and checked for adequate function Safety Precautions: Patient was assessed for positional comfort and pressure points before starting the procedure. Time-out: I initiated and conducted the "Time-out" before starting the procedure, as per protocol. The patient was asked to participate by confirming the accuracy of the "Time Out" information. Verification of the correct person, site, and procedure were performed and confirmed by me, the nursing staff, and the patient. "Time-out" conducted as per Joint Commission's Universal Protocol (UP.01.01.01). Time: 1114  Description of Procedure:          Laterality: Right Targeted Levels:  L2, L3, L4, L5, & S1 Medial Branch Level(s)  Safety Precautions: Aspiration looking for blood return was conducted prior to all injections. At no  point did we inject any substances, as a needle was being advanced. Before injecting, the patient was told to immediately notify me if he was experiencing any new onset of "ringing in the ears,  or metallic taste in the mouth". No attempts were made at seeking any paresthesias. Safe injection practices and needle disposal techniques used. Medications properly checked for expiration dates. SDV (single dose vial) medications used. After the completion of the procedure, all disposable equipment used was discarded in the proper designated medical waste containers. Local Anesthesia: Protocol guidelines were followed. The patient was positioned over the fluoroscopy table. The area was prepped in the usual manner. The time-out was completed. The target area was identified using fluoroscopy. A 12-in long, straight, sterile hemostat was used with fluoroscopic guidance to locate the targets for each level blocked. Once located, the skin was marked with an approved surgical skin marker. Once all sites were marked, the skin (epidermis, dermis, and hypodermis), as well as deeper tissues (fat, connective tissue and muscle) were infiltrated with a small amount of a short-acting local anesthetic, loaded on a 10cc syringe with a 25G, 1.5-in  Needle. An appropriate amount of time was allowed for local anesthetics to take effect before proceeding to the next step. Local Anesthetic: Lidocaine 2.0% The unused portion of the local anesthetic was discarded in the proper designated containers. Technical description of process:  L2 Medial Branch Nerve Block (MBB): The target area for the L2 medial branch is at the junction of the postero-lateral aspect of the superior articular process and the superior, posterior, and medial edge of the transverse process of L3. Under fluoroscopic guidance, a Quincke needle was inserted until contact was made with os over the superior postero-lateral aspect of the pedicular shadow (target area). After  negative aspiration for blood, 0.5 mL of the nerve block solution was injected without difficulty or complication. The needle was removed intact. L3 Medial Branch Nerve Block (MBB): The target area for the L3 medial branch is at the junction of the postero-lateral aspect of the superior articular process and the superior, posterior, and medial edge of the transverse process of L4. Under fluoroscopic guidance, a Quincke needle was inserted until contact was made with os over the superior postero-lateral aspect of the pedicular shadow (target area). After negative aspiration for blood, 0.5 mL of the nerve block solution was injected without difficulty or complication. The needle was removed intact. L4 Medial Branch Nerve Block (MBB): The target area for the L4 medial branch is at the junction of the postero-lateral aspect of the superior articular process and the superior, posterior, and medial edge of the transverse process of L5. Under fluoroscopic guidance, a Quincke needle was inserted until contact was made with os over the superior postero-lateral aspect of the pedicular shadow (target area). After negative aspiration for blood, 0.5 mL of the nerve block solution was injected without difficulty or complication. The needle was removed intact. L5 Medial Branch Nerve Block (MBB): The target area for the L5 medial branch is at the junction of the postero-lateral aspect of the superior articular process and the superior, posterior, and medial edge of the sacral ala. Under fluoroscopic guidance, a Quincke needle was inserted until contact was made with os over the superior postero-lateral aspect of the pedicular shadow (target area). After negative aspiration for blood, 0.5 mL of the nerve block solution was injected without difficulty or complication. The needle was removed intact. S1 Medial Branch Nerve Block (MBB): The target area for the S1 medial branch is at the posterior and inferior 6 o'clock position of  the L5-S1 facet joint. Under fluoroscopic guidance, the Quincke needle inserted for the L5 MBB was redirected until contact was made with  os over the inferior and postero aspect of the sacrum, at the 6 o' clock position under the L5-S1 facet joint (Target area). After negative aspiration for blood, 0.5 mL of the nerve block solution was injected without difficulty or complication. The needle was removed intact.  Once the entire procedure was completed, the treated area was cleaned, making sure to leave some of the prepping solution back to take advantage of its long term bactericidal properties.         Illustration of the posterior view of the lumbar spine and the posterior neural structures. Laminae of L2 through S1 are labeled. DPRL5, dorsal primary ramus of L5; DPRS1, dorsal primary ramus of S1; DPR3, dorsal primary ramus of L3; FJ, facet (zygapophyseal) joint L3-L4; I, inferior articular process of L4; LB1, lateral branch of dorsal primary ramus of L1; IAB, inferior articular branches from L3 medial branch (supplies L4-L5 facet joint); IBP, intermediate branch plexus; MB3, medial branch of dorsal primary ramus of L3; NR3, third lumbar nerve root; S, superior articular process of L5; SAB, superior articular branches from L4 (supplies L4-5 facet joint also); TP3, transverse process of L3.  Vitals:   09/13/21 1034 09/13/21 1106 09/13/21 1113 09/13/21 1118  BP: 126/73 (!) 145/73 (!) 158/82 (!) 150/81  Pulse: (!) 58     Resp: '16 18 18 16  '$ Temp: (!) 97.2 F (36.2 C)     TempSrc: Temporal     SpO2: 97% 100% 98% 99%  Weight: 190 lb (86.2 kg)     Height: '5\' 11"'$  (1.803 m)        Start Time: 1114 hrs. End Time: 1118 hrs.  Imaging Guidance (Spinal):          Type of Imaging Technique: Fluoroscopy Guidance (Spinal) Indication(s): Assistance in needle guidance and placement for procedures requiring needle placement in or near specific anatomical locations not easily accessible without such  assistance. Exposure Time: Please see nurses notes. Contrast: None used. Fluoroscopic Guidance: I was personally present during the use of fluoroscopy. "Tunnel Vision Technique" used to obtain the best possible view of the target area. Parallax error corrected before commencing the procedure. "Direction-depth-direction" technique used to introduce the needle under continuous pulsed fluoroscopy. Once target was reached, antero-posterior, oblique, and lateral fluoroscopic projection used confirm needle placement in all planes. Images permanently stored in EMR. Interpretation: No contrast injected. I personally interpreted the imaging intraoperatively. Adequate needle placement confirmed in multiple planes. Permanent images saved into the patient's record.  Antibiotic Prophylaxis:   Anti-infectives (From admission, onward)    None      Indication(s): None identified  Post-operative Assessment:  Post-procedure Vital Signs:  Pulse/HCG Rate: (!) 5869 Temp: (!) 97.2 F (36.2 C) Resp: 16 BP: (!) 150/81 SpO2: 99 %  EBL: None  Complications: No immediate post-treatment complications observed by team, or reported by patient.  Note: The patient tolerated the entire procedure well. A repeat set of vitals were taken after the procedure and the patient was kept under observation following institutional policy, for this type of procedure. Post-procedural neurological assessment was performed, showing return to baseline, prior to discharge. The patient was provided with post-procedure discharge instructions, including a section on how to identify potential problems. Should any problems arise concerning this procedure, the patient was given instructions to immediately contact us, at any time, without hesitation. In any case, we plan to contact the patient by telephone for a follow-up status report regarding this interventional procedure.  Comments:  No additional relevant information.  Plan of Care   Orders:  Orders Placed This Encounter  Procedures   LUMBAR FACET(MEDIAL BRANCH NERVE BLOCK) MBNB    Scheduling Instructions:     Procedure: Lumbar facet block (AKA.: Lumbosacral medial branch nerve block)     Side: Right-sided     Level: L3-4 & L5-S1 Facets (L2, L3, L4, L5, & S1 Medial Branch Nerves)     Sedation: Patient's choice.     Timeframe: Today    Order Specific Question:   Where will this procedure be performed?    Answer:   [facility]   DG [facility] C-ARM 1-60 MIN NO REPORT    Intraoperative interpretation by procedural physician at [place].    Standing Status:   Standing    Number of Occurrences:   1    Order Specific Question:   Reason for exam:    Answer:   Assistance in needle guidance and placement for procedures requiring needle placement in or near specific anatomical locations not easily accessible without such assistance.   Informed Consent Details: Physician/Practitioner Attestation; Transcribe to consent form and obtain patient signature    Nursing Order: Transcribe to consent form and obtain patient signature. Note: Always confirm laterality of pain with Mr. Gavitt, before procedure.    Order Specific Question:   Physician/Practitioner attestation of informed consent for procedure/surgical case    Answer:   I, the physician/practitioner, attest that I have discussed with the patient the benefits, risks, side effects, alternatives, likelihood of achieving goals and potential problems during recovery for the procedure that I have provided informed consent.    Order Specific Question:   Procedure    Answer:   Lumbar Facet Block  under fluoroscopic guidance    Order Specific Question:   Physician/Practitioner performing the procedure    Answer:   Krystyne Tewksbury A. Dossie Arbour MD    Order Specific Question:   Indication/Reason    Answer:   Low Back Pain, with our without leg pain, due to Facet Joint Arthralgia (Joint Pain) Spondylosis (Arthritis of the  Spine), without myelopathy or radiculopathy (Nerve Damage).   Provide equipment / supplies at bedside    "Block Tray" (Disposable  single use) Needle type: SpinalSpinal Amount/quantity: 4 Size: Medium (5-inch) Gauge: 22G    Standing Status:   Standing    Number of Occurrences:   1    Order Specific Question:   Specify    Answer:   Block Tray   Chronic Opioid Analgesic:  Oxycodone IR 5 mg tablet, 1 tab p.o. 3 times daily (# 60) (last filled on 07/04/2021) MME/day: 22.5 mg/day   Medications ordered for procedure: Meds ordered this encounter  Medications   lidocaine (XYLOCAINE) 2 % (with pres) injection 400 mg   pentafluoroprop-tetrafluoroeth (GEBAUERS) aerosol   DISCONTD: lactated ringers infusion   DISCONTD: midazolam (VERSED) 5 MG/5ML injection 0.5-2 mg    Make sure Flumazenil is available in the pyxis when using this medication. If oversedation occurs, administer 0.2 mg IV over 15 sec. If after 45 sec no response, administer 0.2 mg again over 1 min; may repeat at 1 min intervals; not to exceed 4 doses (1 mg)   ropivacaine (PF) 2 mg/mL (0.2%) (NAROPIN) injection 9 mL   triamcinolone acetonide (KENALOG-40) injection 40 mg   Medications administered: We administered lidocaine, ropivacaine (PF) 2 mg/mL (0.2%), and triamcinolone acetonide.  See the medical record for exact dosing, route, and time of administration.  Follow-up plan:   Return in about 2 weeks (  around 09/27/2021) for Proc-day (T,Th), (F2F), (PPE).       Interventional Therapies  Risk  Complexity Considerations:   Estimated body mass index is 28.29 kg/m as calculated from the following:   Height as of this encounter: 5' 10.5" (1.791 m).   Weight as of this encounter: 200 lb (90.7 kg). History of: abdominal aortic aneurysm (AAA); hypertension; cardiomyopathy; coronary artery disease; dissection of abdominal aorta; orthostatic hypotension; irregular heart rhythm; chronic kidney disease; shortness of breath; tobacco  abuse; COPD; GERD   Planned  Pending:   Pending further evaluation   Under consideration:   Diagnostic right lumbar facet MBB #1  Diagnostic bilateral SI block #1  Diagnostic right LESI #1  Diagnostic right TFESI #1    Completed:   None at this time   Completed by other providers:   Diagnostic/therapeutic bilateral IA steroid knee inj. (03/15/2016) by Carlynn Spry, PA Rosanne Gutting)    Therapeutic  Palliative (PRN) options:   None established       Recent Visits Date Type Provider Dept  08/27/21 Office Visit Milinda Pointer, MD [facility]  08/09/21 Procedure visit Milinda Pointer, MD [facility]  07/23/21 Office Visit Milinda Pointer, MD [facility]  Showing recent visits within past 90 days and meeting all other requirements Today's Visits Date Type Provider Dept  09/13/21 Procedure visit Milinda Pointer, MD [facility]  Showing today's visits and meeting all other requirements Future Appointments Date Type Provider Dept  09/27/21 Appointment Milinda Pointer, MD [facility]  11/28/21 Appointment Milinda Pointer, MD [facility]  Showing future appointments within next 90 days and meeting all other requirements  Disposition: Discharge home  Discharge (Date  Time): 09/13/2021; 1130 hrs.   Primary Care Physician: Olin Hauser, DO Location: [facility] Note by: Gaspar Cola, MD Date: 09/13/2021; Time: 11:47 AM  Disclaimer:  Medicine is not an Chief Strategy Officer. The only guarantee in medicine is that nothing is guaranteed. It is important to note that the decision to proceed with this intervention was based on the information collected from the patient. The Data and conclusions were drawn from the patient's questionnaire, the interview, and the physical examination. Because the information was provided in large part by the patient, it cannot be guaranteed  that it has not been purposely or unconsciously manipulated. Every effort has been made to obtain as much relevant data as possible for this evaluation. It is important to note that the conclusions that lead to this procedure are derived in large part from the available data. Always take into account that the treatment will also be dependent on availability of resources and existing treatment guidelines, considered by other Pain Management Practitioners as being common knowledge and practice, at the time of the intervention. For Medico-Legal purposes, it is also important to point out that variation in procedural techniques and pharmacological choices are the acceptable norm. The indications, contraindications, technique, and results of the above procedure should only be interpreted and judged by a Board-Certified Interventional Pain Specialist with extensive familiarity and expertise in the same exact procedure and technique.

## 2021-09-13 ENCOUNTER — Ambulatory Visit: Payer: Medicare Other | Attending: Pain Medicine | Admitting: Pain Medicine

## 2021-09-13 ENCOUNTER — Encounter: Payer: Self-pay | Admitting: Pain Medicine

## 2021-09-13 ENCOUNTER — Ambulatory Visit
Admission: RE | Admit: 2021-09-13 | Discharge: 2021-09-13 | Disposition: A | Discharge status: Home / Self Care | Payer: Medicare Other | Source: Ambulatory Visit | Attending: Pain Medicine | Admitting: Pain Medicine

## 2021-09-13 ENCOUNTER — Other Ambulatory Visit: Payer: Self-pay

## 2021-09-13 VITALS — BP 150/81 | HR 58 | Temp 97.2°F | Resp 16 | Ht 71.0 in | Wt 190.0 lb

## 2021-09-13 DIAGNOSIS — M5137 Other intervertebral disc degeneration, lumbosacral region: Secondary | ICD-10-CM | POA: Diagnosis not present

## 2021-09-13 DIAGNOSIS — M545 Low back pain, unspecified: Secondary | ICD-10-CM | POA: Insufficient documentation

## 2021-09-13 DIAGNOSIS — G8929 Other chronic pain: Secondary | ICD-10-CM | POA: Diagnosis not present

## 2021-09-13 DIAGNOSIS — M47816 Spondylosis without myelopathy or radiculopathy, lumbar region: Secondary | ICD-10-CM | POA: Diagnosis not present

## 2021-09-13 DIAGNOSIS — M4186 Other forms of scoliosis, lumbar region: Secondary | ICD-10-CM | POA: Insufficient documentation

## 2021-09-13 DIAGNOSIS — M47817 Spondylosis without myelopathy or radiculopathy, lumbosacral region: Secondary | ICD-10-CM | POA: Insufficient documentation

## 2021-09-13 DIAGNOSIS — M25551 Pain in right hip: Secondary | ICD-10-CM | POA: Diagnosis not present

## 2021-09-13 MED ORDER — PENTAFLUOROPROP-TETRAFLUOROETH EX AERO
INHALATION_SPRAY | Freq: Once | CUTANEOUS | Status: DC
  Filled 2021-09-13: qty 116

## 2021-09-13 MED ORDER — LIDOCAINE HCL 2 % IJ SOLN
20.0000 mL | Freq: Once | INTRAMUSCULAR | Status: AC
  Administered 2021-09-13: 400 mg
  Filled 2021-09-13: qty 40

## 2021-09-13 MED ORDER — TRIAMCINOLONE ACETONIDE 40 MG/ML IJ SUSP
40.0000 mg | Freq: Once | INTRAMUSCULAR | Status: AC
  Administered 2021-09-13: 40 mg
  Filled 2021-09-13: qty 1

## 2021-09-13 MED ORDER — ROPIVACAINE HCL 2 MG/ML IJ SOLN
9.0000 mL | Freq: Once | INTRAMUSCULAR | Status: AC
  Administered 2021-09-13: 9 mL via PERINEURAL
  Filled 2021-09-13: qty 20

## 2021-09-13 MED ORDER — LACTATED RINGERS IV SOLN: Freq: Once | INTRAVENOUS | Status: DC

## 2021-09-13 MED ORDER — MIDAZOLAM HCL 5 MG/5ML IJ SOLN: 0.5000 mg | Freq: Once | INTRAMUSCULAR | Status: DC

## 2021-09-13 NOTE — Progress Notes (Signed)
Safety precautions to be maintained throughout the outpatient stay will include: orient to surroundings, keep bed in low position, maintain call bell within reach at all times, provide assistance with transfer out of bed and ambulation.  

## 2021-09-13 NOTE — Patient Instructions (Addendum)
____________________________________________________________________________________________ ° °Post-Procedure Discharge Instructions ° °Instructions: °Apply ice:  °Purpose: This will minimize any swelling and discomfort after procedure.  °When: Day of procedure, as soon as you get home. °How: Fill a plastic sandwich bag with crushed ice. Cover it with a small towel and apply to injection site. °How long: (15 min on, 15 min off) Apply for 15 minutes then remove x 15 minutes.  Repeat sequence on day of procedure, until you go to bed. °Apply heat:  °Purpose: To treat any soreness and discomfort from the procedure. °When: Starting the next day after the procedure. °How: Apply heat to procedure site starting the day following the procedure. °How long: May continue to repeat daily, until discomfort goes away. °Food intake: Start with clear liquids (like water) and advance to regular food, as tolerated.  °Physical activities: Keep activities to a minimum for the first 8 hours after the procedure. After that, then as tolerated. °Driving: If you have received any sedation, be responsible and do not drive. You are not allowed to drive for 24 hours after having sedation. °Blood thinner: (Applies only to those taking blood thinners) You may restart your blood thinner 6 hours after your procedure. °Insulin: (Applies only to Diabetic patients taking insulin) As soon as you can eat, you may resume your normal dosing schedule. °Infection prevention: Keep procedure site clean and dry. Shower daily and clean area with soap and water. °Post-procedure Pain Diary: Extremely important that this be done correctly and accurately. Recorded information will be used to determine the next step in treatment. For the purpose of accuracy, follow these rules: °Evaluate only the area treated. Do not report or include pain from an untreated area. For the purpose of this evaluation, ignore all other areas of pain, except for the treated area. °After  your procedure, avoid taking a long nap and attempting to complete the pain diary after you wake up. Instead, set your alarm clock to go off every hour, on the hour, for the initial 8 hours after the procedure. Document the duration of the numbing medicine, and the relief you are getting from it. °Do not go to sleep and attempt to complete it later. It will not be accurate. If you received sedation, it is likely that you were given a medication that may cause amnesia. Because of this, completing the diary at a later time may cause the information to be inaccurate. This information is needed to plan your care. °Follow-up appointment: Keep your post-procedure follow-up evaluation appointment after the procedure (usually 2 weeks for most procedures, 6 weeks for radiofrequencies). DO NOT FORGET to bring you pain diary with you.  ° °Expect: (What should I expect to see with my procedure?) °From numbing medicine (AKA: Local Anesthetics): Numbness or decrease in pain. You may also experience some weakness, which if present, could last for the duration of the local anesthetic. °Onset: Full effect within 15 minutes of injected. °Duration: It will depend on the type of local anesthetic used. On the average, 1 to 8 hours.  °From steroids (Applies only if steroids were used): Decrease in swelling or inflammation. Once inflammation is improved, relief of the pain will follow. °Onset of benefits: Depends on the amount of swelling present. The more swelling, the longer it will take for the benefits to be seen. In some cases, up to 10 days. °Duration: Steroids will stay in the system x 2 weeks. Duration of benefits will depend on multiple posibilities including persistent irritating factors. °Side-effects: If present, they   may typically last 2 weeks (the duration of the steroids). °Frequent: Cramps (if they occur, drink Gatorade and take over-the-counter Magnesium 450-500 mg once to twice a day); water retention with temporary  weight gain; increases in blood sugar; decreased immune system response; increased appetite. °Occasional: Facial flushing (red, warm cheeks); mood swings; menstrual changes. °Uncommon: Long-term decrease or suppression of natural hormones; bone thinning. (These are more common with higher doses or more frequent use. This is why we prefer that our patients avoid having any injection therapies in other practices.)  °Very Rare: Severe mood changes; psychosis; aseptic necrosis. °From procedure: Some discomfort is to be expected once the numbing medicine wears off. This should be minimal if ice and heat are applied as instructed. ° °Call if: (When should I call?) °You experience numbness and weakness that gets worse with time, as opposed to wearing off. °New onset bowel or bladder incontinence. (Applies only to procedures done in the spine) ° °Emergency Numbers: °Durning business hours (Monday - Thursday, 8:00 AM - 4:00 PM) (Friday, 9:00 AM - 12:00 Noon): [phone] °After hours: [phone] °NOTE: If you are having a problem and are unable connect with, or to talk to a provider, then go to your nearest urgent care or emergency department. If the problem is serious and urgent, please call 911. °____________________________________________________________________________________________ ° Pain Management Discharge Instructions ° °General Discharge Instructions : ° °If you need to reach your doctor call: Monday-Friday 8:00 am - 4:00 pm at [phone] or toll free [phone].  After clinic hours [phone] to have operator reach doctor. ° °Bring all of your medication bottles to all your appointments in the pain clinic. ° °To cancel or reschedule your appointment with Pain Management please remember to call 24 hours in advance to avoid a fee. ° °Refer to the educational materials which you have been given on: General Risks, I had my Procedure. Discharge Instructions, Post Sedation. ° °Post Procedure  Instructions: ° °The drugs you were given will stay in your system until tomorrow, so for the next 24 hours you should not drive, make any legal decisions or drink any alcoholic beverages. ° °You may eat anything you prefer, but it is better to start with liquids then soups and crackers, and gradually work up to solid foods. ° °Please notify your doctor immediately if you have any unusual bleeding, trouble breathing or pain that is not related to your normal pain. ° °Depending on the type of procedure that was done, some parts of your body may feel week and/or numb.  This usually clears up by tonight or the next day. ° °Walk with the use of an assistive device or accompanied by an adult for the 24 hours. ° °You may use ice on the affected area for the first 24 hours.  Put ice in a Ziploc bag and cover with a towel and place against area 15 minutes on 15 minutes off.  You may switch to heat after 24 hours.Facet Blocks °Patient Information ° °Description: The facets are joints in the spine between the vertebrae.  Like any joints in the body, facets can become irritated and painful.  Arthritis can also effect the facets.  By injecting steroids and local anesthetic in and around these joints, we can temporarily block the nerve supply to them.  Steroids act directly on irritated nerves and tissues to reduce selling and inflammation which often leads to decreased pain.  Facet blocks may be done anywhere along the spine from the neck to the   low back depending upon the location of your pain. °  After numbing the skin with local anesthetic (like Novocaine), a small needle is passed onto the facet joints under x-ray guidance.  You may experience a sensation of pressure while this is being done.  The entire block usually lasts about 15-25 minutes.  ° °Conditions which may be treated by facet blocks: ° °Low back/buttock pain °Neck/shoulder pain °Certain types of headaches ° °Preparation for the injection: ° °Do not eat any  solid food or dairy products within 8 hours of your appointment. °You may drink clear liquid up to 3 hours before appointment.  Clear liquids include water, black coffee, juice or soda.  No milk or cream please. °You may take your regular medication, including pain medications, with a sip of water before your appointment.  Diabetics should hold regular insulin (if taken separately) and take 1/2 normal NPH dose the morning of the procedure.  Carry some sugar containing items with you to your appointment. °A driver must accompany you and be prepared to drive you home after your procedure. °Bring all your current medications with you. °An IV may be inserted and sedation may be given at the discretion of the physician. °A blood pressure cuff, EKG and other monitors will often be applied during the procedure.  Some patients may need to have extra oxygen administered for a short period. °You will be asked to provide medical information, including your allergies and medications, prior to the procedure.  We must know immediately if you are taking blood thinners (like Coumadin/Warfarin) or if you are allergic to IV iodine contrast (dye).  We must know if you could possible be pregnant. ° °Possible side-effects: ° °Bleeding from needle site °Infection (rare, may require surgery) °Nerve injury (rare) °Numbness & tingling (temporary) °Difficulty urinating (rare, temporary) °Spinal headache (a headache worse with upright posture) °Light-headedness (temporary) °Pain at injection site (serveral days) °Decreased blood pressure (rare, temporary) °Weakness in arm/leg (temporary) °Pressure sensation in back/neck (temporary) ° ° °Call if you experience: ° °Fever/chills associated with headache or increased back/neck pain °Headache worsened by an upright position °New onset, weakness or numbness of an extremity below the injection site °Hives or difficulty breathing (go to the emergency room) °Inflammation or drainage at the injection  site(s) °Severe back/neck pain greater than usual °New symptoms which are concerning to you ° °Please note: ° °Although the local anesthetic injected can often make your back or neck feel good for several hours after the injection, the pain will likely return. It takes 3-7 days for steroids to work.  You may not notice any pain relief for at least one week. ° °If effective, we will often do a series of 2-3 injections spaced 3-6 weeks apart to maximally decrease your pain.  After the initial series, you may be a candidate for a more permanent nerve block of the facets. ° °If you have any questions, please call [phone] °[facility] °

## 2021-09-14 ENCOUNTER — Telehealth: Payer: Self-pay | Admitting: *Deleted

## 2021-09-14 NOTE — Telephone Encounter (Signed)
Post procedure call;  no answer and no voicemail capability.

## 2021-09-20 ENCOUNTER — Telehealth: Payer: Self-pay

## 2021-09-20 ENCOUNTER — Ambulatory Visit (INDEPENDENT_AMBULATORY_CARE_PROVIDER_SITE_OTHER): Payer: Medicare Other

## 2021-09-20 ENCOUNTER — Other Ambulatory Visit: Payer: Self-pay | Admitting: Family Medicine

## 2021-09-20 ENCOUNTER — Telehealth: Payer: Medicaid Other

## 2021-09-20 DIAGNOSIS — G894 Chronic pain syndrome: Secondary | ICD-10-CM

## 2021-09-20 DIAGNOSIS — G8929 Other chronic pain: Secondary | ICD-10-CM

## 2021-09-20 DIAGNOSIS — F419 Anxiety disorder, unspecified: Secondary | ICD-10-CM

## 2021-09-20 DIAGNOSIS — F32A Depression, unspecified: Secondary | ICD-10-CM

## 2021-09-20 DIAGNOSIS — J432 Centrilobular emphysema: Secondary | ICD-10-CM

## 2021-09-20 DIAGNOSIS — I2510 Atherosclerotic heart disease of native coronary artery without angina pectoris: Secondary | ICD-10-CM

## 2021-09-20 DIAGNOSIS — E785 Hyperlipidemia, unspecified: Secondary | ICD-10-CM

## 2021-09-20 MED ORDER — SPIRIVA RESPIMAT 2.5 MCG/ACT IN AERS: 2.0000 | INHALATION_SPRAY | Freq: Every day | RESPIRATORY_TRACT | 0 refills | Status: DC

## 2021-09-20 NOTE — Telephone Encounter (Signed)
  Care Management   Follow Up Note   09/20/2021 Name: Charles Ewing MRN: [id number] DOB: 30-Apr-1940   Referred by: Olin Hauser, DO Reason for referral : Chronic Care Management (RNCM: Call made back to the patient to let the patient know that his Tiotropium has been sent in by the pcp)   Call returned to the patient to let the patient know that his Tiotropium has been submitted for refill. The patient advised to wait to hear from his pharmacy for instructions on when the RX will be available.  Follow Up Plan: Telephone follow up appointment with care management team member scheduled for: 10-18-2021 at [place] am  Noreene Larsson RN, MSN, [place] Mobile: [phone]

## 2021-09-20 NOTE — Telephone Encounter (Signed)
  Care Management   Follow Up Note   09/20/2021 Name: Charles Ewing MRN: [id number] DOB: May 06, 1940   Referred by: Olin Hauser, DO Reason for referral : Chronic Care Management (RNCM: Follow up for Chronic Disease Management and Care Coordination Needs )   An unsuccessful telephone outreach was attempted today. The patient was referred to the case management team for assistance with care management and care coordination.   Follow Up Plan: The care management team will reach out to the patient again over the next 30 days.   Noreene Larsson RN, MSN, [place] Mobile: [phone]

## 2021-09-20 NOTE — Patient Instructions (Signed)
Visit Information  Thank you for taking time to visit with me today. Please don't hesitate to contact me if I can be of assistance to you before our next scheduled telephone appointment.  Following are the goals we discussed today:  CAD  (Status: Goal on Track (progressing): YES.) Long Term Goal       BP Readings from Last 3 Encounters:  09/13/21 (!) 150/81  08/27/21 133/68  08/09/21 108/64         Lab Results  Component Value Date    CHOL 96 10/30/2020    HDL 40 10/30/2020    LDLCALC 39 10/30/2020    TRIG 87 10/30/2020    CHOLHDL 2.4 10/30/2020    Assessed understanding of CAD diagnosis. 09-20-2021: The patient has a good understanding of CAD.  Medications reviewed including medications utilized in CAD treatment plan Assessed understanding of CAD diagnosis Medications reviewed including medications utilized in CAD treatment plan. 09-20-2021: The patient is working with the pharm D for medication needs. Is taking medications as directed Provided education on importance of blood pressure control in management of CAD; Provided education on Importance of limiting foods high in cholesterol; Counseled on importance of regular laboratory monitoring as prescribed. 09-20-2021: review and education provided  Counseled on the importance of exercise goals with target of 150 minutes per week Reviewed Importance of taking all medications as prescribed Reviewed Importance of attending all scheduled provider appointments Advised to report any changes in symptoms or exercise tolerance Advised patient to discuss changes in CAD and heart health with provider;   COPD: (Status: Goal on Track (progressing): YES.) Long Term Goal  Reviewed medications with patient, including use of prescribed maintenance and rescue inhalers, and provided instruction on medication management and the importance of adherence. 08-16-2021: The patient states that he takes his medications as directed and knows how to use his  inhalers for effective management of COPD. 09-20-2021: Audible wheezing on the call today. The patient states he had been going to the [place] but cannot get there was reaching out to the [facility] for assistance with inhalers. Will collaborate with the pcp and pharm D for assistance.  Provided patient with basic written and verbal COPD education on self care/management/and exacerbation prevention Advised patient to track and manage COPD triggers. 08-16-2021: The patient knows the weather changes really impact his breathing in a negative way. He tries to stay in when it is really hot outside and knows this can cause him to have a bad cough and trouble breathing. 09-20-2021: The patient states that he is doing okay. Was wheezing at the time of the call. Has his albuterol and is taking as directed. Ask for assistance with getting refills on his Tiotropium. Provided written and verbal instructions on pursed lip breathing and utilized returned demonstration as teach back Provided instruction about proper use of medications used for management of COPD including inhalers. 09-20-2021: Collaboration with the pcp and the pharm D for assistance with getting Tiotropium filled. The patient has been unable to return to the [place] and ask for assistance with refills for this medications. Has albuterol.  Advised patient to self assesses COPD action plan zone and make appointment with provider if in the yellow zone for 48 hours without improvement. 09-20-2021: Review and education Advised patient to engage in light exercise as tolerated 3-5 days a week to aid in the the management of COPD Provided education about and advised patient to utilize infection prevention strategies to reduce risk of respiratory infection Discussed the importance  of adequate rest and management of fatigue with COPD Screening for signs and symptoms of depression related to chronic disease state  Assessed social determinant of health barriers   Anxiety and  Depression  (Status: Goal on Track (progressing): YES.) Long Term Goal  Evaluation of current treatment plan related to Anxiety and Depression, Mental Health Concerns  self-management and patient's adherence to plan as established by provider. 09-20-2021: The patient is optimistic today. Feels like his depression and anxiety is stable. Does get concerned over medication needs and sharing his vehicle with his son. He is thankful for the help from the CCM team.  Discussed plans with patient for ongoing care management follow up and provided patient with direct contact information for care management team Advised patient to call the office for changes in his mood, anxiety, depression, or mental health. The patient states since his pain is better managed his attitude is better; Provided education to patient re: support system and working with the CCM team to optimize health and well being and be a support to the patient with management of his chronic conditions and mental health needs; Reviewed medications with patient and discussed compliance. The patient admits that he has not always taken his medications correctly but he is open to recommendations and help from the CCM team; Provided patient with number to reach [facility], services available, support system and resources available educational materials related to effective management of mental health and well being- depression and anxiety; Discussed plans with patient for ongoing care management follow up and provided patient with direct contact information for care management team; Advised patient to discuss changes in his mental health and well being  with provider; Screening for signs and symptoms of depression related to chronic disease state;  Assessed social determinant of health barriers;    Hyperlipidemia:  (Status: Goal on Track (progressing): YES.) Long Term Goal       Lab Results  Component Value Date    CHOL 96 10/30/2020    HDL 40 10/30/2020     LDLCALC 39 10/30/2020    TRIG 87 10/30/2020    CHOLHDL 2.4 10/30/2020      Medication review performed; medication list updated in electronic medical record. 09-20-2021: Takes simvastatin 20 mg QD for effective management of HLD Provider established cholesterol goals reviewed; Counseled on importance of regular laboratory monitoring as prescribed. 09-20-2021: The patient needs updated lipid panel. Will alert pcp.  Provided HLD educational materials; Reviewed role and benefits of statin for ASCVD risk reduction; Discussed strategies to manage statin-induced myalgias; Reviewed importance of limiting foods high in cholesterol; Reviewed exercise goals and target of 150 minutes per week;   Pain:  (Status: Goal on Track (progressing): YES.) Long Term Goal  Pain assessment performed. 08-16-2021: The patient is rating his pain level today at a 4 on a scale of 0-10. The patient states it is much better now since the pcp referred him to the pain clinic. 09-20-2021: Rates his pain in his back at an 8 today. Stats the shots did really well for him but they have wore off. He has adequate pain medications. Will follow up with pain specialist on 09-27-2021.  Medications reviewed. 08-16-2021: He states that they "loaded" him up with good medications at the pain clinic and he can tell a positive difference in his pain. He says he mainly has back pain but it can move around. He states that his pain is a lot better since going tot he pain specialist. He will follow  up again with the pain specialist next week. Education and support given. 09-20-2021: Has adequate pain medications. States he see the specialist next week for follow up appointment.  Reviewed provider established plan for pain management. 09-20-2021: The patient is seeing the pain specialist now and can tell a positive difference in his pain level Discussed importance of adherence to all scheduled medical appointments.Upcoming appointments for 09-27-2021 and  11-28-2021 Counseled on the importance of reporting any/all new or changed pain symptoms or management strategies to pain management provider; Advised patient to report to care team affect of pain on daily activities; Discussed use of relaxation techniques and/or diversional activities to assist with pain reduction (distraction, imagery, relaxation, massage, acupressure, TENS, heat, and cold application; Reviewed with patient prescribed pharmacological and nonpharmacological pain relief strategies; Advised patient to discuss changes in level and intensity of pain or unresolved pain  with provider;   Patient Goals/Self-Care Activities: Take medications as prescribed   Attend all scheduled provider appointments Call pharmacy for medication refills 3-7 days in advance of running out of medications Attend church or other social activities Perform all self care activities independently  Perform IADL's (shopping, preparing meals, housekeeping, managing finances) independently Call provider office for new concerns or questions  Work with the social worker to address care coordination needs and will continue to work with the clinical team to address health care and disease management related needs call the Suicide and Crisis Lifeline: 988 call the [place] National Suicide Prevention Lifeline: 386-620-4890 or TTY: 504-734-0950 TTY 531-852-8589) to talk to a trained counselor call 1-800-273-TALK (toll free, 24 hour hotline) if experiencing a Mental Health or [place]  avoid second hand smoke eliminate smoking in my home identify and avoid work-related triggers identify and remove indoor air pollutants limit outdoor activity during cold weather listen for public air quality announcements every day develop a rescue plan eliminate symptom triggers at home follow rescue plan if symptoms flare-up use an extra pillow to sleep develop a new routine to improve sleep don't eat or exercise  right before bedtime eat healthy/prescribed diet: heart healthy diet get at least 7 to 8 hours of sleep at night use devices that will help like a cane, sock-puller or reacher do exercises in a comfortable position that makes breathing as easy as possible - call for medicine refill 2 or 3 days before it runs out - take all medications exactly as prescribed - call doctor with any symptoms you believe are related to your medicine - call doctor when you experience any new symptoms - go to all doctor appointments as scheduled        Problem: Disease Progression         Goal: Disease Progression Prevented or Minimized    Note:   Evidence-based guidance:  Discuss potential for disease progression based on clinical diagnosis and/or causation and other risk factors including race, ethnicity, age, comorbidities, lifestyle.  Encourage lifestyle changes including maintaining a healthy weight, exercise and activity, limiting alcohol consumption, smoking cessation to improve long-term outcomes.  Acknowledge difficulty in making life-long behavior change; provide guidance to incorporate lifestyle change into daily life and social activities; provide emotional support, coaching and praise for success.  Anticipate the use of pharmacologic therapy to prevent disease progression and manage complications or comorbidities, such as hypertension, cardiovascular disease, diabetes, anemia, bone disorders, peripheral vascular disease.  Monitor efficacy of pharmacologic therapy; monitor and manage side effects.  Monitor for nephrotoxic medication or supplement use including NSAIDs (nonsteroidal anti-inflammatory drugs),  radio-contrast media, oral phosphate-containing bowel preparations, herbal supplements, protein supplements.  Encourage consultation and counseling with pharmacist.  Anticipate and prepare patient for early nephrology referral if significant increase of ACR (albumin to creatinine ratio),  hard-to-manage complications of decreased GFR (glomerular filtration rate) arise.  Anticipate and prepare patient for laboratory studies to stage and/or monitor CKD and to identify and manage complications and comorbidities.  Address barriers to treatment adherence, such as cognitive function, illness, drug interaction, medication cost, presence of depression or anxiety.  Facilitate coordination between nephrologist and primary care provider to ensure continuation of care for co-morbidities and routine preventive care.   Notes:       Our next appointment is by telephone on 10-18-2021 at 0945 am  Please call the care guide team at [phone] if you need to cancel or reschedule your appointment.   If you are experiencing a Mental Health or [place] or need someone to talk to, please call the Suicide and Crisis Lifeline: 988 call the [place] National Suicide Prevention Lifeline: 630-549-8462 or TTY: (276)274-2157 TTY 9363995549) to talk to a trained counselor call 1-800-273-TALK (toll free, 24 hour hotline)   The patient verbalized understanding of instructions, educational materials, and care plan provided today and DECLINED offer to receive copy of patient instructions, educational materials, and care plan.   Noreene Larsson RN, MSN, [place] [facility] Mobile: [phone]

## 2021-09-20 NOTE — Chronic Care Management (AMB) (Signed)
Chronic Care Management   CCM RN Visit Note  09/20/2021 Name: Charles Ewing MRN: [id number] DOB: 27-Apr-1940  Subjective: Charles Ewing is a 81 y.o. year old male who is a primary care patient of Olin Hauser, DO. The care management team was consulted for assistance with disease management and care coordination needs.    Engaged with patient by telephone for follow up visit in response to provider referral for case management and/or care coordination services.   Consent to Services:  The patient was given information about Chronic Care Management services, agreed to services, and gave verbal consent prior to initiation of services.  Please see initial visit note for detailed documentation.   Patient agreed to services and verbal consent obtained.   Assessment: Review of patient past medical history, allergies, medications, health status, including review of consultants reports, laboratory and other test data, was performed as part of comprehensive evaluation and provision of chronic care management services.   SDOH (Social Determinants of Health) assessments and interventions performed:    CCM Care Plan  Allergies  Allergen Reactions   Nexium [Esomeprazole Magnesium]     Outpatient Encounter Medications as of 09/20/2021  Medication Sig Note   albuterol (VENTOLIN HFA) 108 (90 Base) MCG/ACT inhaler Inhale 2 puffs into the lungs every 6 (six) hours as needed for wheezing or shortness of breath.    amLODipine (NORVASC) 5 MG tablet TAKE 1 TABLET BY MOUTH DAILY    aspirin EC 81 MG tablet Take 162 mg by mouth every evening.    Cholecalciferol (VITAMIN D3) 125 MCG (5000 UT) CAPS Take 1 capsule (5,000 Units total) by mouth daily with breakfast. Take along with calcium and magnesium.    FLUoxetine (PROZAC) 20 MG capsule Take 1 capsule (20 mg total) by mouth 2 (two) times daily.    fluticasone (FLONASE) 50 MCG/ACT nasal spray PLACE 2 SPRAYS INTO BOTH NOSTRILS DAILY (Patient  taking differently: Place 2 sprays into both nostrils daily as needed.)    furosemide (LASIX) 20 MG tablet Take 1 tablet (20 mg total) by mouth every other day.    gabapentin (NEURONTIN) 100 MG capsule START 1 CAPSULE BY MOUTH DAILY, INCREASEBY 1 CAPSULE EVERY 2 TO 3 DAYS AS TOLERATED UP TO 3 TIMES A DAY, OR MAY TAKE 3 AT ONCE IN THE EVENING 08/27/2021: Taking 1 capsule three times daily   Lancets (ONETOUCH ULTRASOFT) lancets Use as instructed (Patient not taking: Reported on 09/13/2021)    LORazepam (ATIVAN) 0.5 MG tablet TAKE 1 TABLET BY MOUTH AT BEDTIME AS NEEDED FOR ANXIETY    losartan (COZAAR) 25 MG tablet TAKE 1 TABLET BY MOUTH DAILY    meloxicam (MOBIC) 15 MG tablet TAKE 1 TABLET BY MOUTH DAILY AS NEEDED FOR PAIN    oxyCODONE (OXY IR/ROXICODONE) 5 MG immediate release tablet Take 1 tablet (5 mg total) by mouth 2 (two) times daily as needed for severe pain. Each refill must last 30 days.    [START ON 10/02/2021] oxyCODONE (OXY IR/ROXICODONE) 5 MG immediate release tablet Take 1 tablet (5 mg total) by mouth 2 (two) times daily as needed for severe pain. Each refill must last 30 days.    [START ON 11/01/2021] oxyCODONE (OXY IR/ROXICODONE) 5 MG immediate release tablet Take 1 tablet (5 mg total) by mouth 2 (two) times daily as needed for severe pain. Each refill must last 30 days. 08/27/2021: WARNING: Not a Duplicate. Future prescription. DO NOT DELETE during hospital medication reconciliation or at discharge. [facility]  Patient    pantoprazole (PROTONIX) 20 MG tablet Take 1 tablet by mouth daily.    potassium chloride (MICRO-K) 10 MEQ CR capsule Take 1 capsule (10 mEq total) by mouth every other day.    simvastatin (ZOCOR) 20 MG tablet Take 1 tablet (20 mg total) by mouth at bedtime.    tamsulosin (FLOMAX) 0.4 MG CAPS capsule TAKE 1 CAPSULE BY MOUTH DAILY AFTER BREAKFAST    Tiotropium Bromide Monohydrate (SPIRIVA RESPIMAT) 2.5 MCG/ACT AERS Inhale 2 each into the lungs daily. (Patient  not taking: Reported on 09/13/2021) 09/13/2021: Out of medication, has not taken in approx 2 weeks.   No facility-administered encounter medications on file as of 09/20/2021.    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) 07/23/2021   Osteoarthritis of hips (Bilateral) 07/23/2021   Vitamin D deficiency 07/23/2021   Vitamin B12 deficiency 07/23/2021   Long term prescription benzodiazepine use 06/13/2021   Chronic use of opiate for therapeutic purpose 06/13/2021   Long term current use of non-steroidal anti-inflammatories (NSAID) 06/13/2021   Chronic hip pain (2ry area of Pain) (Right) 06/13/2021   Chronic lower extremity pain (3ry area of Pain) (Right) 06/13/2021   Chronic cardiopulmonary disease (HCC) 06/13/2021   Chronic low back pain (1ry area of Pain) (Bilateral) (R>L) w/o sciatica 06/13/2021   Lumbar facet syndrome 06/13/2021   Depressive disorder 06/12/2021   Chronic pain syndrome 06/12/2021   Pharmacologic therapy 06/12/2021   Disorder of skeletal system 06/12/2021   Problems influencing health status 06/12/2021   Seborrheic keratosis 05/24/2021   Actinic keratosis 05/24/2021   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[place]) 05/24/2021   Basal cell carcinoma of nose 05/24/2021   Disorder of eustachian tube 05/24/2021   Esophageal reflux 05/24/2021   History of colonic polyps 05/24/2021   History of malignant neoplasm of skin 05/24/2021   Hypertensive heart disease without congestive heart  failure 05/24/2021   Neoplasm of uncertain behavior of skin 05/24/2021   Other ill-defined and unknown causes of morbidity and mortality 05/24/2021   Other specified counseling 05/24/2021   Reason for consultation 05/24/2021   Tobacco use disorder 05/24/2021   Verruca vulgaris 05/24/2021   Atypical chest pain 05/24/2021   Panic attack 05/24/2021   Panic disorder 05/24/2021   Depression    Hypertension    Nicotine dependence    Cardiomyopathy ([place])    PTSD (post-traumatic stress disorder) 11/06/2020   Venous insufficiency of both lower extremities 09/04/2020   Bilateral lower extremity edema 09/04/2020   Irregular heart rhythm 08/01/2016   Slow transit constipation 04/17/2016   Anxiety 04/17/2016   Insomnia 04/17/2016   Osteoarthritis of knees (Bilateral) 03/22/2016   SOB (shortness of breath) on exertion 12/13/2015   Left sided chest pain 12/11/2015   Coronary artery disease    Benign hypertension with CKD (chronic kidney disease) stage III ([place]) 11/17/2015   Abdominal aortic aneurysm (AAA) ([place]) 06/12/2015   Prediabetes 05/15/2015   Hyperlipidemia 05/15/2015   Centrilobular emphysema ([place]) 05/15/2015   Orthostatic hypotension 02/28/2015   Elevated prostate specific antigen (PSA) 04/14/2012   Benign prostatic hyperplasia with  urinary obstruction 04/14/2012   False passage of urethra 04/14/2012    Conditions to be addressed/monitored:CAD, HLD, COPD, Anxiety, Depression, and Chronic pain   Care Plan : RNCM: General Plan of Care (Adult) for Chronic Disease Management and Care Coordination Needs  Updates made by Vanita Ingles, RN since 09/20/2021 12:00 AM     Problem: RNCM: Development of plan of care for Chronic Disease Management (HLD, CAD, COPD, Anxiety, Depression, Chronic Pain)   Priority: High     Long-Range Goal: RNCM: Effective Management of plan of care for Chronic Disease Management (HLD, CAD, COPD, Anxiety, Depression, Chronic Pain)   Start Date: 08/16/2021   Expected End Date: 08/17/2022  Priority: High  Note:   Current Barriers:  Knowledge Deficits related to plan of care for management of CAD, HLD, COPD, Chronic Pain, and anxiety and depression  Care Coordination needs related to Mental Health Concerns   Chronic Disease Management support and education needs related to CAD, HLD, COPD, Chronic Pain, and Anxiety and depression Lacks caregiver support.         RNCM Clinical Goal(s):  Patient will verbalize basic understanding of CAD, HLD, COPD, Anxiety, Depression, and Chronic pain  disease process and self health management plan as evidenced by routine visits with pcp and specialist, following plan of care, working with the CCM team to effectively manage health and well being, VS stable, and compliance with dietary restrictions and medications management. take all medications exactly as prescribed and will call provider for medication related questions as evidenced by compliance with medications and calling for refills before running out of medications    attend all scheduled medical appointments: with pcp and specialist as evidenced by keeping appointments and calling for schedule change needs        work with pharmacist to address medication barriers and how to make sure he is effectively taking medications as prescribed related to CAD, HLD, COPD, Anxiety, Depression, and Chronic pain  as evidenced by review of EMR and patient or pharmacist report    demonstrate ongoing self health care management ability for effective management of Chronic conditions as evidenced by working with the CCM team through collaboration with Consulting civil engineer, provider, and care team.   Interventions: 1:1 collaboration with primary care provider regarding development and update of comprehensive plan of care as evidenced by provider attestation and co-signature Inter-disciplinary care team collaboration (see longitudinal plan of care) Evaluation of current treatment plan  related to  self management and patient's adherence to plan as established by provider   CAD  (Status: Goal on Track (progressing): YES.) Long Term Goal   BP Readings from Last 3 Encounters:  09/13/21 (!) 150/81  08/27/21 133/68  08/09/21 108/64    Lab Results  Component Value Date   CHOL 96 10/30/2020   HDL 40 10/30/2020   LDLCALC 39 10/30/2020   TRIG 87 10/30/2020   CHOLHDL 2.4 10/30/2020    Assessed understanding of CAD diagnosis. 09-20-2021: The patient has a good understanding of CAD.  Medications reviewed including medications utilized in CAD treatment plan Assessed understanding of CAD diagnosis Medications reviewed including medications utilized in CAD treatment plan. 09-20-2021: The patient is working with the pharm D for medication needs. Is taking medications as directed Provided education on importance of blood pressure control in management of CAD; Provided education on Importance of limiting foods high in cholesterol; Counseled on importance of regular laboratory monitoring as prescribed. 09-20-2021: review and education provided  Counseled on the importance of exercise  goals with target of 150 minutes per week Reviewed Importance of taking all medications as prescribed Reviewed Importance of attending all scheduled provider appointments Advised to report any changes in symptoms or exercise tolerance Advised patient to discuss changes in CAD and heart health with provider;  COPD: (Status: Goal on Track (progressing): YES.) Long Term Goal  Reviewed medications with patient, including use of prescribed maintenance and rescue inhalers, and provided instruction on medication management and the importance of adherence. 08-16-2021: The patient states that he takes his medications as directed and knows how to use his inhalers for effective management of COPD. 09-20-2021: Audible wheezing on the call today. The patient states he had been going to the [place] but cannot get there was  reaching out to the [facility] for assistance with inhalers. Will collaborate with the pcp and pharm D for assistance.  Provided patient with basic written and verbal COPD education on self care/management/and exacerbation prevention Advised patient to track and manage COPD triggers. 08-16-2021: The patient knows the weather changes really impact his breathing in a negative way. He tries to stay in when it is really hot outside and knows this can cause him to have a bad cough and trouble breathing. 09-20-2021: The patient states that he is doing okay. Was wheezing at the time of the call. Has his albuterol and is taking as directed. Ask for assistance with getting refills on his Tiotropium. Provided written and verbal instructions on pursed lip breathing and utilized returned demonstration as teach back Provided instruction about proper use of medications used for management of COPD including inhalers. 09-20-2021: Collaboration with the pcp and the pharm D for assistance with getting Tiotropium filled. The patient has been unable to return to the [place] and ask for assistance with refills for this medications. Has albuterol.  Advised patient to self assesses COPD action plan zone and make appointment with provider if in the yellow zone for 48 hours without improvement. 09-20-2021: Review and education Advised patient to engage in light exercise as tolerated 3-5 days a week to aid in the the management of COPD Provided education about and advised patient to utilize infection prevention strategies to reduce risk of respiratory infection Discussed the importance of adequate rest and management of fatigue with COPD Screening for signs and symptoms of depression related to chronic disease state  Assessed social determinant of health barriers  Anxiety and Depression  (Status: Goal on Track (progressing): YES.) Long Term Goal  Evaluation of current treatment plan related to Anxiety and Depression, Mental Health Concerns   self-management and patient's adherence to plan as established by provider. 09-20-2021: The patient is optimistic today. Feels like his depression and anxiety is stable. Does get concerned over medication needs and sharing his vehicle with his son. He is thankful for the help from the CCM team.  Discussed plans with patient for ongoing care management follow up and provided patient with direct contact information for care management team Advised patient to call the office for changes in his mood, anxiety, depression, or mental health. The patient states since his pain is better managed his attitude is better; Provided education to patient re: support system and working with the CCM team to optimize health and well being and be a support to the patient with management of his chronic conditions and mental health needs; Reviewed medications with patient and discussed compliance. The patient admits that he has not always taken his medications correctly but he is open to recommendations and help from the  CCM team; Provided patient with number to reach [facility], services available, support system and resources available educational materials related to effective management of mental health and well being- depression and anxiety; Discussed plans with patient for ongoing care management follow up and provided patient with direct contact information for care management team; Advised patient to discuss changes in his mental health and well being  with provider; Screening for signs and symptoms of depression related to chronic disease state;  Assessed social determinant of health barriers;   Hyperlipidemia:  (Status: Goal on Track (progressing): YES.) Long Term Goal  Lab Results  Component Value Date   CHOL 96 10/30/2020   HDL 40 10/30/2020   LDLCALC 39 10/30/2020   TRIG 87 10/30/2020   CHOLHDL 2.4 10/30/2020     Medication review performed; medication list updated in electronic medical record. 09-20-2021: Takes  simvastatin 20 mg QD for effective management of HLD Provider established cholesterol goals reviewed; Counseled on importance of regular laboratory monitoring as prescribed. 09-20-2021: The patient needs updated lipid panel. Will alert pcp.  Provided HLD educational materials; Reviewed role and benefits of statin for ASCVD risk reduction; Discussed strategies to manage statin-induced myalgias; Reviewed importance of limiting foods high in cholesterol; Reviewed exercise goals and target of 150 minutes per week;  Pain:  (Status: Goal on Track (progressing): YES.) Long Term Goal  Pain assessment performed. 08-16-2021: The patient is rating his pain level today at a 4 on a scale of 0-10. The patient states it is much better now since the pcp referred him to the pain clinic. 09-20-2021: Rates his pain in his back at an 8 today. Stats the shots did really well for him but they have wore off. He has adequate pain medications. Will follow up with pain specialist on 09-27-2021.  Medications reviewed. 08-16-2021: He states that they "loaded" him up with good medications at the pain clinic and he can tell a positive difference in his pain. He says he mainly has back pain but it can move around. He states that his pain is a lot better since going tot he pain specialist. He will follow up again with the pain specialist next week. Education and support given. 09-20-2021: Has adequate pain medications. States he see the specialist next week for follow up appointment.  Reviewed provider established plan for pain management. 09-20-2021: The patient is seeing the pain specialist now and can tell a positive difference in his pain level Discussed importance of adherence to all scheduled medical appointments.Upcoming appointments for 09-27-2021 and 11-28-2021 Counseled on the importance of reporting any/all new or changed pain symptoms or management strategies to pain management provider; Advised patient to report to care team  affect of pain on daily activities; Discussed use of relaxation techniques and/or diversional activities to assist with pain reduction (distraction, imagery, relaxation, massage, acupressure, TENS, heat, and cold application; Reviewed with patient prescribed pharmacological and nonpharmacological pain relief strategies; Advised patient to discuss changes in level and intensity of pain or unresolved pain  with provider;  Patient Goals/Self-Care Activities: Take medications as prescribed   Attend all scheduled provider appointments Call pharmacy for medication refills 3-7 days in advance of running out of medications Attend church or other social activities Perform all self care activities independently  Perform IADL's (shopping, preparing meals, housekeeping, managing finances) independently Call provider office for new concerns or questions  Work with the social worker to address care coordination needs and will continue to work with the clinical team to address health care  and disease management related needs call the Suicide and Crisis Lifeline: 988 call the [place] National Suicide Prevention Lifeline: 579-433-5675 or TTY: 516 224 9440 TTY 857-654-1445) to talk to a trained counselor call 1-800-273-TALK (toll free, 24 hour hotline) if experiencing a Mental Health or [place]  avoid second hand smoke eliminate smoking in my home identify and avoid work-related triggers identify and remove indoor air pollutants limit outdoor activity during cold weather listen for public air quality announcements every day develop a rescue plan eliminate symptom triggers at home follow rescue plan if symptoms flare-up use an extra pillow to sleep develop a new routine to improve sleep don't eat or exercise right before bedtime eat healthy/prescribed diet: heart healthy diet get at least 7 to 8 hours of sleep at night use devices that will help like a cane, sock-puller or reacher do  exercises in a comfortable position that makes breathing as easy as possible - call for medicine refill 2 or 3 days before it runs out - take all medications exactly as prescribed - call doctor with any symptoms you believe are related to your medicine - call doctor when you experience any new symptoms - go to all doctor appointments as scheduled      Problem: Disease Progression      Goal: Disease Progression Prevented or Minimized   Note:   Evidence-based guidance:  Discuss potential for disease progression based on clinical diagnosis and/or causation and other risk factors including race, ethnicity, age, comorbidities, lifestyle.  Encourage lifestyle changes including maintaining a healthy weight, exercise and activity, limiting alcohol consumption, smoking cessation to improve long-term outcomes.  Acknowledge difficulty in making life-long behavior change; provide guidance to incorporate lifestyle change into daily life and social activities; provide emotional support, coaching and praise for success.  Anticipate the use of pharmacologic therapy to prevent disease progression and manage complications or comorbidities, such as hypertension, cardiovascular disease, diabetes, anemia, bone disorders, peripheral vascular disease.  Monitor efficacy of pharmacologic therapy; monitor and manage side effects.  Monitor for nephrotoxic medication or supplement use including NSAIDs (nonsteroidal anti-inflammatory drugs), radio-contrast media, oral phosphate-containing bowel preparations, herbal supplements, protein supplements.  Encourage consultation and counseling with pharmacist.  Anticipate and prepare patient for early nephrology referral if significant increase of ACR (albumin to creatinine ratio), hard-to-manage complications of decreased GFR (glomerular filtration rate) arise.  Anticipate and prepare patient for laboratory studies to stage and/or monitor CKD and to identify and manage  complications and comorbidities.  Address barriers to treatment adherence, such as cognitive function, illness, drug interaction, medication cost, presence of depression or anxiety.  Facilitate coordination between nephrologist and primary care provider to ensure continuation of care for co-morbidities and routine preventive care.   Notes:      Plan:Telephone follow up appointment with care management team member scheduled for:  10-18-2021 at [place] am   Noreene Larsson RN, MSN, [place] Mobile: [phone]

## 2021-09-23 NOTE — Progress Notes (Unsigned)
PROVIDER NOTE: Information contained herein reflects review and annotations entered in association with encounter. Interpretation of such information and data should be left to medically-trained personnel. Information provided to patient can be located elsewhere in the medical record under "Patient Instructions". Document created using STT-dictation technology, any transcriptional errors that may result from process are unintentional.    Patient: Charles Ewing  Service Category: E/M  Provider: Gaspar Cola, MD  DOB: [birth year]  DOS: 09/27/2021  Referring Provider: [name] *  MRN: [id number]  Specialty: Interventional Pain Management  PCP: Olin Hauser, DO  Type: Established Patient  Setting: Ambulatory outpatient    Location: Office  Delivery: Face-to-face     HPI  Charles Ewing, a 81 y.o. year old male, is here today because of his No primary diagnosis found.. Charles Ewing primary complain today is No chief complaint on file. Last encounter: My last encounter with him was on 09/13/2021. Pertinent problems: Charles Ewing has Osteoarthritis of knees (Bilateral); Bilateral lower extremity edema; Chronic pain syndrome; Chronic hip pain (2ry area of Pain) (Right); Chronic lower extremity pain (3ry area of Pain) (Right); Chronic low back pain (1ry area of Pain) (Bilateral) (R>L) w/o sciatica; Lumbar facet syndrome; Dextroscoliosis of lumbar spine; DDD (degenerative disc disease), lumbosacral; Osteopenia of lumbar spine; Osteopenia determined by x-ray; Osteoarthritis of sacroiliac joints (HCC) (Bilateral); Osteoarthritis of hips (Bilateral); Chronic sacroiliac joint pain (Bilateral); Sacroiliac joint dysfunction (Bilateral); Spondylosis without myelopathy or radiculopathy, lumbosacral region; and Other spondylosis, sacral and sacrococcygeal region on their pertinent problem list. Pain Assessment: Severity of   is reported as a  /10. Location:    / . Onset:  . Quality:  . Timing:   . Modifying factor(s):  Marland Kitchen Vitals:  vitals were not taken for this visit.   Reason for encounter: post-procedure evaluation and assessment. ***  Post-procedure evaluation   Type: Lumbar Facet, Medial Branch Block(s) #2  Laterality: Right  Level: L2, L3, L4, L5, & S1 Medial Branch Level(s). Injecting these levels blocks the L3-4, L4-5 and L5-S1 lumbar facet joints. Imaging: Fluoroscopic guidance         Anesthesia: Local anesthesia (1-2% Lidocaine) Anxiolysis: None                Sedation: None. DOS: 09/13/2021 Performed by: Gaspar Cola, MD  Primary Purpose: Diagnostic/Therapeutic Indications: Low back pain severe enough to impact quality of life or function. 1. Lumbar facet syndrome   2. Spondylosis without myelopathy or radiculopathy, lumbosacral region   3. DDD (degenerative disc disease), lumbosacral   4. Chronic low back pain (1ry area of Pain) (Bilateral) (R>L) w/o sciatica   5. Chronic hip pain (2ry area of Pain) (Right)   6. Dextroscoliosis of lumbar spine    NAS-11 Pain score:   Pre-procedure: 7 /10   Post-procedure: 0-No pain/10      Effectiveness:  Initial hour after procedure:   ***. Subsequent 4-6 hours post-procedure:   ***. Analgesia past initial 6 hours:   ***. Ongoing improvement:  Analgesic:  *** Function:    ***    ROM:    ***     Pharmacotherapy Assessment  Analgesic: Oxycodone IR 5 mg tablet, 1 tab p.o. 3 times daily (# 60) (last filled on 07/04/2021) MME/day: 22.5 mg/day   Monitoring: [place] PMP: PDMP reviewed during this encounter.       Pharmacotherapy: No side-effects or adverse reactions reported. Compliance: No problems identified. Effectiveness: Clinically acceptable.  No notes on file  UDS:  Summary  Date Value Ref Range Status  06/14/2021 Note  Final    Comment:    ==================================================================== Compliance Drug Analysis,  [place] ==================================================================== Test                             Result       Flag       Units  Drug Present and Declared for Prescription Verification   Lorazepam                      200          EXPECTED   ng/mg creat    Source of lorazepam is a scheduled prescription medication.    Oxycodone                      1671         EXPECTED   ng/mg creat   Noroxycodone                   2522         EXPECTED   ng/mg creat    Sources of oxycodone include scheduled prescription medications.    Noroxycodone is an expected metabolite of oxycodone.    Gabapentin                     PRESENT      EXPECTED   Fluoxetine                     PRESENT      EXPECTED   Norfluoxetine                  PRESENT      EXPECTED    Norfluoxetine is an expected metabolite of fluoxetine.  Drug Absent but Declared for Prescription Verification   Salicylate                     Not Detected UNEXPECTED    Aspirin, as indicated in the declared medication list, is not always    detected even when used as directed.    Metoprolol                     Not Detected UNEXPECTED ==================================================================== Test                      Result    Flag   Units      Ref Range   Creatinine              41               mg/dL      >=20 ==================================================================== Declared Medications:  The flagging and interpretation on this report are based on the  following declared medications.  Unexpected results may arise from  inaccuracies in the declared medications.   **Note: The testing scope of this panel includes these medications:   Fluoxetine (Prozac)  Gabapentin (Neurontin)  Lorazepam (Ativan)  Metoprolol (Toprol)  Oxycodone (Roxicodone)   **Note: The testing scope of this panel does not include small to  moderate amounts of these reported medications:   Aspirin   **Note: The testing scope of this panel  does not include the  following reported medications:   Albuterol (Ventolin HFA)  Amlodipine (Norvasc)  Fluticasone (Flonase)  Furosemide (Lasix)  Losartan (Cozaar)  Meloxicam (Mobic)  Mupirocin (Bactroban)  Nystatin  Pantoprazole (Protonix)  Potassium Chloride  Simvastatin (Zocor)  Sucralfate (Carafate)  Tamsulosin (Flomax) ==================================================================== For clinical consultation, please call [phone]. ====================================================================      ROS  Constitutional: Denies any fever or chills Gastrointestinal: No reported hemesis, hematochezia, vomiting, or acute GI distress Musculoskeletal: Denies any acute onset joint swelling, redness, loss of ROM, or weakness Neurological: No reported episodes of acute onset apraxia, aphasia, dysarthria, agnosia, amnesia, paralysis, loss of coordination, or loss of consciousness  Medication Review  FLUoxetine, LORazepam, Tiotropium Bromide Monohydrate, Vitamin D3, albuterol, amLODipine, aspirin EC, fluticasone, furosemide, gabapentin, losartan, meloxicam, onetouch ultrasoft, oxyCODONE, pantoprazole, potassium chloride, simvastatin, and tamsulosin  History Review  Allergy: Charles Ewing is allergic to nexium [esomeprazole magnesium]. Drug: Charles Ewing  reports no history of drug use. Alcohol:  reports no history of alcohol use. Tobacco:  reports that he has been smoking cigarettes. He has a 70.00 pack-year smoking history. He has never used smokeless tobacco. Social: Charles Ewing  reports that he has been smoking cigarettes. He has a 70.00 pack-year smoking history. He has never used smokeless tobacco. He reports that he does not drink alcohol and does not use drugs. Medical:  has a past medical history of Abdominal aortic aneurysm (AAA) ([place]) (2013), Anxiety, Arrhythmia, Arthritis, Colon polyp, COPD (chronic obstructive pulmonary disease) ([place]), Coronary artery disease,  Depression, GERD (gastroesophageal reflux disease), Hyperlipidemia, Hypertension, Skin cancer, and Thrush. Surgical: Charles Ewing  has a past surgical history that includes Skin cancer excision; Colon polyp surgery; Transurethral resection of prostate (2010); Cataract extraction w/PHACO (Left, 05/09/2020); and Cataract extraction w/PHACO (Right, 05/23/2020). Family: family history includes Kidney cancer in his mother.  Laboratory Chemistry Profile   Renal Lab Results  Component Value Date   BUN 18 06/13/2021   CREATININE 1.18 06/13/2021   BCR 15 06/13/2021   GFRAA 64 01/11/2019   GFRNONAA >60 12/23/2020    Hepatic Lab Results  Component Value Date   AST 13 06/13/2021   ALT 11 10/30/2020   ALBUMIN 3.9 06/13/2021   ALKPHOS 70 06/13/2021    Electrolytes Lab Results  Component Value Date   NA 138 06/13/2021   K 4.6 06/13/2021   CL 102 06/13/2021   CALCIUM 8.8 06/13/2021   MG 2.3 06/13/2021    Bone Lab Results  Component Value Date   25OHVITD1 14 (L) 06/13/2021   25OHVITD2 1.8 06/13/2021   25OHVITD3 12 06/13/2021    Inflammation (CRP: Acute Phase) (ESR: Chronic Phase) Lab Results  Component Value Date   CRP 1 06/13/2021   ESRSEDRATE 20 06/13/2021         Note: Above Lab results reviewed.  Recent Imaging Review  DG PAIN CLINIC C-ARM 1-60 MIN NO REPORT Fluoro was used, but no Radiologist interpretation will be provided.  Please refer to "NOTES" tab for provider progress note. Note: Reviewed        Physical Exam  General appearance: Well nourished, well developed, and well hydrated. In no apparent acute distress Mental status: Alert, oriented x 3 (person, place, & time)       Respiratory: No evidence of acute respiratory distress Eyes: PERLA Vitals: There were no vitals taken for this visit. BMI: Estimated body mass index is 26.5 kg/m as calculated from the following:   Height as of 09/13/21: '5\' 11"'  (1.803 m).   Weight as of 09/13/21: 190 lb (86.2 kg). Ideal: Ideal body  weight: 75.3 kg (166 lb 0.1 oz) Adjusted ideal body weight: 79.7 kg (175 lb 9.7 oz)  Assessment   Diagnosis Status  No diagnosis found. Controlled Controlled Controlled   Updated Problems: No problems updated.  Plan of Care  Problem-specific:  No problem-specific Assessment & Plan notes found for this encounter.  Charles Ewing has a current medication list which includes the following long-term medication(s): albuterol, amlodipine, fluoxetine, fluticasone, furosemide, gabapentin, losartan, oxycodone, [START ON 10/02/2021] oxycodone, [START ON 11/01/2021] oxycodone, potassium chloride, simvastatin, and spiriva respimat.  Pharmacotherapy (Medications Ordered): No orders of the defined types were placed in this encounter.  Orders:  No orders of the defined types were placed in this encounter.  Follow-up plan:   No follow-ups on file.     Interventional Therapies  Risk  Complexity Considerations:   Estimated body mass index is 28.29 kg/m as calculated from the following:   Height as of this encounter: 5' 10.5" (1.791 m).   Weight as of this encounter: 200 lb (90.7 kg). History of: abdominal aortic aneurysm (AAA); hypertension; cardiomyopathy; coronary artery disease; dissection of abdominal aorta; orthostatic hypotension; irregular heart rhythm; chronic kidney disease; shortness of breath; tobacco abuse; COPD; GERD   Planned  Pending:   Pending further evaluation   Under consideration:   Diagnostic right lumbar facet MBB #1  Diagnostic bilateral SI block #1  Diagnostic right LESI #1  Diagnostic right TFESI #1    Completed:   None at this time   Completed by other providers:   Diagnostic/therapeutic bilateral IA steroid knee inj. (03/15/2016) by Carlynn Spry, PA Rosanne Gutting)    Therapeutic  Palliative (PRN) options:   None established        Recent Visits Date Type Provider Dept  09/13/21 Procedure visit Milinda Pointer, MD [facility]   08/27/21 Office Visit Milinda Pointer, MD [facility]  08/09/21 Procedure visit Milinda Pointer, MD [facility]  07/23/21 Office Visit Milinda Pointer, MD [facility]  Showing recent visits within past 90 days and meeting all other requirements Future Appointments Date Type Provider Dept  09/27/21 Appointment Milinda Pointer, MD [facility]  11/28/21 Appointment Milinda Pointer, MD [facility]  Showing future appointments within next 90 days and meeting all other requirements  I discussed the assessment and treatment plan with the patient. The patient was provided an opportunity to ask questions and all were answered. The patient agreed with the plan and demonstrated an understanding of the instructions.  Patient advised to call back or seek an in-person evaluation if the symptoms or condition worsens.  Duration of encounter: *** minutes.  Total time on encounter, as per AMA guidelines included both the face-to-face and non-face-to-face time personally spent by the physician and/or other qualified health care professional(s) on the day of the encounter (includes time in activities that require the physician or other qualified health care professional and does not include time in activities normally performed by clinical staff). Physician's time may include the following activities when performed: preparing to see the patient (eg, review of tests, pre-charting review of records) obtaining and/or reviewing separately obtained history performing a medically appropriate examination and/or evaluation counseling and educating the patient/family/caregiver ordering medications, tests, or procedures referring and communicating with other health care professionals (when not separately reported) documenting clinical information in the electronic or other health record independently interpreting results (not separately reported) and  communicating results to the patient/ family/caregiver care coordination (not separately reported)  Note by: Gaspar Cola, MD Date: 09/27/2021; Time: 6:22 PM

## 2021-09-24 ENCOUNTER — Telehealth: Payer: Medicaid Other

## 2021-09-24 ENCOUNTER — Telehealth: Payer: Self-pay | Admitting: Pharmacist

## 2021-09-24 NOTE — Telephone Encounter (Signed)
  Chronic Care Management   Outreach Note  09/24/2021 Name: Charles Ewing MRN: [id number] DOB: 07-May-1940  Referred by: Olin Hauser, DO Reason for referral : No chief complaint on file.   Was unable to reach patient via telephone today and unable to leave a message as voicemail is not setup.   Follow Up Plan: Will collaborate with Care Guide to outreach to schedule follow up with me  Wallace Cullens, PharmD, [place] [facility] [phone]

## 2021-09-27 ENCOUNTER — Other Ambulatory Visit: Payer: Self-pay

## 2021-09-27 ENCOUNTER — Encounter: Payer: Self-pay | Admitting: Pain Medicine

## 2021-09-27 ENCOUNTER — Ambulatory Visit: Payer: Medicare Other | Attending: Pain Medicine | Admitting: Pain Medicine

## 2021-09-27 VITALS — BP 135/75 | HR 95 | Temp 97.4°F | Resp 14 | Ht 71.0 in | Wt 195.0 lb

## 2021-09-27 DIAGNOSIS — M545 Low back pain, unspecified: Secondary | ICD-10-CM | POA: Diagnosis not present

## 2021-09-27 DIAGNOSIS — G8929 Other chronic pain: Secondary | ICD-10-CM | POA: Diagnosis not present

## 2021-09-27 DIAGNOSIS — M47817 Spondylosis without myelopathy or radiculopathy, lumbosacral region: Secondary | ICD-10-CM

## 2021-09-27 DIAGNOSIS — M25551 Pain in right hip: Secondary | ICD-10-CM

## 2021-09-27 DIAGNOSIS — M47816 Spondylosis without myelopathy or radiculopathy, lumbar region: Secondary | ICD-10-CM | POA: Diagnosis not present

## 2021-09-27 DIAGNOSIS — M5137 Other intervertebral disc degeneration, lumbosacral region: Secondary | ICD-10-CM | POA: Diagnosis not present

## 2021-09-27 NOTE — Patient Instructions (Addendum)
______________________________________________________________________  Preparing for Procedure with Sedation  NOTICE: Due to recent regulatory changes, starting on September 11, 2020, procedures requiring intravenous (IV) sedation will no longer be performed at the [facility].  These types of procedures are required to be performed at [facility] ambulatory surgery facility.  We are very sorry for the inconvenience.  Procedure appointments are limited to planned procedures: No Prescription Refills. No disability issues will be discussed. No medication changes will be discussed.  Instructions: Oral Intake: Do not eat or drink anything for at least 8 hours prior to your procedure. (Exception: Blood Pressure Medication. See below.) Transportation: A driver is required. You may not drive yourself after the procedure. Blood Pressure Medicine: Do not forget to take your blood pressure medicine with a sip of water the morning of the procedure. If your Diastolic (lower reading) is above 100 mmHg, elective cases will be cancelled/rescheduled. Blood thinners: These will need to be stopped for procedures. Notify our staff if you are taking any blood thinners. Depending on which one you take, there will be specific instructions on how and when to stop it. Diabetics on insulin: Notify the staff so that you can be scheduled 1st case in the morning. If your diabetes requires high dose insulin, take only  of your normal insulin dose the morning of the procedure and notify the staff that you have done so. Preventing infections: Shower with an antibacterial soap the morning of your procedure. Build-up your immune system: Take 1000 mg of Vitamin C with every meal (3 times a day) the day prior to your procedure. Antibiotics: Inform the staff if you have a condition or reason that requires you to take antibiotics before dental procedures. Pregnancy: If you are pregnant, call and cancel the procedure. Sickness: If  you have a cold, fever, or any active infections, call and cancel the procedure. Arrival: You must be in the facility at least 30 minutes prior to your scheduled procedure. Children: Do not bring children with you. Dress appropriately: There is always the possibility that your clothing may get soiled. Valuables: Do not bring any jewelry or valuables.  Reasons to call and reschedule or cancel your procedure: (Following these recommendations will minimize the risk of a serious complication.) Surgeries: Avoid having procedures within 2 weeks of any surgery. (Avoid for 2 weeks before or after any surgery). Flu Shots: Avoid having procedures within 2 weeks of a flu shots. (Avoid for 2 weeks before or after immunizations). Barium: Avoid having a procedure within 7-10 days after having had a radiological study involving the use of radiological contrast. (Myelograms, Barium swallow or enema study). Heart attacks: Avoid any elective procedures or surgeries for the initial 6 months after a "Myocardial Infarction" (Heart Attack). Blood thinners: It is imperative that you stop these medications before procedures. Let us know if you if you take any blood thinner.  Infection: Avoid procedures during or within two weeks of an infection (including chest colds or gastrointestinal problems). Symptoms associated with infections include: Localized redness, fever, chills, night sweats or profuse sweating, burning sensation when voiding, cough, congestion, stuffiness, runny nose, sore throat, diarrhea, nausea, vomiting, cold or Flu symptoms, recent or current infections. It is specially important if the infection is over the area that we intend to treat. Heart and lung problems: Symptoms that may suggest an active cardiopulmonary problem include: cough, chest pain, breathing difficulties or shortness of breath, dizziness, ankle swelling, uncontrolled high or unusually low blood pressure, and/or palpitations. If you are    experiencing any of these symptoms, cancel your procedure and contact your primary care physician for an evaluation.  Remember:  Regular Business hours are:  Monday to Thursday 8:00 AM to 4:00 PM  Provider's Schedule: Francisco Naveira, MD:  Procedure days: Tuesday and Thursday 7:30 AM to 4:00 PM  Bilal Lateef, MD:  Procedure days: Monday and Wednesday 7:30 AM to 4:00 PM ______________________________________________________________________  ____________________________________________________________________________________________  General Risks and Possible Complications  Patient Responsibilities: It is important that you read this as it is part of your informed consent. It is our duty to inform you of the risks and possible complications associated with treatments offered to you. It is your responsibility as a patient to read this and to ask questions about anything that is not clear or that you believe was not covered in this document.  Patient's Rights: You have the right to refuse treatment. You also have the right to change your mind, even after initially having agreed to have the treatment done. However, under this last option, if you wait until the last second to change your mind, you may be charged for the materials used up to that point.  Introduction: Medicine is not an exact science. Everything in Medicine, including the lack of treatment(s), carries the potential for danger, harm, or loss (which is by definition: Risk). In Medicine, a complication is a secondary problem, condition, or disease that can aggravate an already existing one. All treatments carry the risk of possible complications. The fact that a side effects or complications occurs, does not imply that the treatment was conducted incorrectly. It must be clearly understood that these can happen even when everything is done following the highest safety standards.  No treatment: You can choose not to proceed with the  proposed treatment alternative. The "PRO(s)" would include: avoiding the risk of complications associated with the therapy. The "CON(s)" would include: not getting any of the treatment benefits. These benefits fall under one of three categories: diagnostic; therapeutic; and/or palliative. Diagnostic benefits include: getting information which can ultimately lead to improvement of the disease or symptom(s). Therapeutic benefits are those associated with the successful treatment of the disease. Finally, palliative benefits are those related to the decrease of the primary symptoms, without necessarily curing the condition (example: decreasing the pain from a flare-up of a chronic condition, such as incurable terminal cancer).  General Risks and Complications: These are associated to most interventional treatments. They can occur alone, or in combination. They fall under one of the following six (6) categories: no benefit or worsening of symptoms; bleeding; infection; nerve damage; allergic reactions; and/or death. No benefits or worsening of symptoms: In Medicine there are no guarantees, only probabilities. No healthcare provider can ever guarantee that a medical treatment will work, they can only state the probability that it may. Furthermore, there is always the possibility that the condition may worsen, either directly, or indirectly, as a consequence of the treatment. Bleeding: This is more common if the patient is taking a blood thinner, either prescription or over the counter (example: Goody Powders, Fish oil, Aspirin, Garlic, etc.), or if suffering a condition associated with impaired coagulation (example: Hemophilia, cirrhosis of the liver, low platelet counts, etc.). However, even if you do not have one on these, it can still happen. If you have any of these conditions, or take one of these drugs, make sure to notify your treating physician. Infection: This is more common in patients with a compromised  immune system, either due to disease (example:   diabetes, cancer, human immunodeficiency virus [HIV], etc.), or due to medications or treatments (example: therapies used to treat cancer and rheumatological diseases). However, even if you do not have one on these, it can still happen. If you have any of these conditions, or take one of these drugs, make sure to notify your treating physician. Nerve Damage: This is more common when the treatment is an invasive one, but it can also happen with the use of medications, such as those used in the treatment of cancer. The damage can occur to small secondary nerves, or to large primary ones, such as those in the spinal cord and brain. This damage may be temporary or permanent and it may lead to impairments that can range from temporary numbness to permanent paralysis and/or brain death. Allergic Reactions: Any time a substance or material comes in contact with our body, there is the possibility of an allergic reaction. These can range from a mild skin rash (contact dermatitis) to a severe systemic reaction (anaphylactic reaction), which can result in death. Death: In general, any medical intervention can result in death, most of the time due to an unforeseen complication. ____________________________________________________________________________________________ ______________________________________________________________________  Preparing for Procedure with Sedation  NOTICE: Due to recent regulatory changes, starting on September 11, 2020, procedures requiring intravenous (IV) sedation will no longer be performed at the [facility].  These types of procedures are required to be performed at [facility] ambulatory surgery facility.  We are very sorry for the inconvenience.  Procedure appointments are limited to planned procedures: No Prescription Refills. No disability issues will be discussed. No medication changes will be discussed.  Instructions: Oral  Intake: Do not eat or drink anything for at least 8 hours prior to your procedure. (Exception: Blood Pressure Medication. See below.) Transportation: A driver is required. You may not drive yourself after the procedure. Blood Pressure Medicine: Do not forget to take your blood pressure medicine with a sip of water the morning of the procedure. If your Diastolic (lower reading) is above 100 mmHg, elective cases will be cancelled/rescheduled. Blood thinners: These will need to be stopped for procedures. Notify our staff if you are taking any blood thinners. Depending on which one you take, there will be specific instructions on how and when to stop it. Diabetics on insulin: Notify the staff so that you can be scheduled 1st case in the morning. If your diabetes requires high dose insulin, take only  of your normal insulin dose the morning of the procedure and notify the staff that you have done so. Preventing infections: Shower with an antibacterial soap the morning of your procedure. Build-up your immune system: Take 1000 mg of Vitamin C with every meal (3 times a day) the day prior to your procedure. Antibiotics: Inform the staff if you have a condition or reason that requires you to take antibiotics before dental procedures. Pregnancy: If you are pregnant, call and cancel the procedure. Sickness: If you have a cold, fever, or any active infections, call and cancel the procedure. Arrival: You must be in the facility at least 30 minutes prior to your scheduled procedure. Children: Do not bring children with you. Dress appropriately: There is always the possibility that your clothing may get soiled. Valuables: Do not bring any jewelry or valuables.  Reasons to call and reschedule or cancel your procedure: (Following these recommendations will minimize the risk of a serious complication.) Surgeries: Avoid having procedures within 2 weeks of any surgery. (Avoid for 2 weeks before or  after any  surgery). Flu Shots: Avoid having procedures within 2 weeks of a flu shots. (Avoid for 2 weeks before or after immunizations). Barium: Avoid having a procedure within 7-10 days after having had a radiological study involving the use of radiological contrast. (Myelograms, Barium swallow or enema study). Heart attacks: Avoid any elective procedures or surgeries for the initial 6 months after a "Myocardial Infarction" (Heart Attack). Blood thinners: It is imperative that you stop these medications before procedures. Let us know if you if you take any blood thinner.  Infection: Avoid procedures during or within two weeks of an infection (including chest colds or gastrointestinal problems). Symptoms associated with infections include: Localized redness, fever, chills, night sweats or profuse sweating, burning sensation when voiding, cough, congestion, stuffiness, runny nose, sore throat, diarrhea, nausea, vomiting, cold or Flu symptoms, recent or current infections. It is specially important if the infection is over the area that we intend to treat. Heart and lung problems: Symptoms that may suggest an active cardiopulmonary problem include: cough, chest pain, breathing difficulties or shortness of breath, dizziness, ankle swelling, uncontrolled high or unusually low blood pressure, and/or palpitations. If you are experiencing any of these symptoms, cancel your procedure and contact your primary care physician for an evaluation.  Remember:  Regular Business hours are:  Monday to Thursday 8:00 AM to 4:00 PM  Provider's Schedule: Milinda Pointer, MD:  Procedure days: Tuesday and Thursday 7:30 AM to 4:00 PM  Gillis Santa, MD:  Procedure days: Monday and Wednesday 7:30 AM to 4:00 PM ______________________________________________________________________  Radiofrequency Ablation Radiofrequency ablation is a procedure that is performed to relieve pain. The procedure is often used for back, neck, or arm  pain. Radiofrequency ablation involves the use of a machine that creates radio waves to make heat. During the procedure, the heat is applied to the nerve that carries the pain signal. The heat damages the nerve and interferes with the pain signal. Pain relief usually starts about 2 weeks after the procedure and lasts for 6 months to 1 year. Tell a health care provider about: Any allergies you have. All medicines you are taking, including vitamins, herbs, eye drops, creams, and over-the-counter medicines. Any problems you or family members have had with anesthetic medicines. Any bleeding problems you have. Any surgeries you have had. Any medical conditions you have. Whether you are pregnant or may be pregnant. What are the risks? Generally, this is a safe procedure. However, problems may occur, including: Pain or soreness at the injection site. Allergic reaction to medicines given during the procedure. Bleeding. Infection at the injection site. Damage to nerves or blood vessels. What happens before the procedure? When to stop eating and drinking Follow instructions from your health care provider about what you may eat and drink before your procedure. These may include: 8 hours before the procedure Stop eating most foods. Do not eat meat, fried foods, or fatty foods. Eat only light foods, such as toast or crackers. All liquids are okay except energy drinks and alcohol. 6 hours before the procedure Stop eating. Drink only clear liquids, such as water, clear fruit juice, black coffee, plain tea, and sports drinks. Do not drink energy drinks or alcohol. 2 hours before the procedure Stop drinking all liquids. You may be allowed to take medicine with small sips of water. If you do not follow your health care provider's instructions, your procedure may be delayed or canceled. Medicines Ask your health care provider about: Changing or stopping your regular medicines. This  is especially  important if you are taking diabetes medicines or blood thinners. Taking medicines such as aspirin and ibuprofen. These medicines can thin your blood. Do not take these medicines unless your health care provider tells you to take them. Taking over-the-counter medicines, vitamins, herbs, and supplements. General instructions Ask your health care provider what steps will be taken to help prevent infection. These steps may include: Removing hair at the procedure site. Washing skin with a germ-killing soap. Taking antibiotic medicine. If you will be going home right after the procedure, plan to have a responsible adult: Take you home from the hospital or clinic. You will not be allowed to drive. Care for you for the time you are told. What happens during the procedure?  You will be awake during the procedure. You will need to be able to talk with the health care provider during the procedure. An IV will be inserted into one of your veins. You will be given one or more of the following: A medicine to help you relax (sedative). A medicine to numb the area (local anesthetic). Your health care provider will insert a radiofrequency needle into the area to be treated. This is done with the help of fluoroscopy. A wire that carries the radio waves (electrode) will be put through the radiofrequency needle. An electrical pulse will be sent through the electrode to verify the correct nerve that is causing your pain. You will feel a tingling sensation, and you may have muscle twitching. The tissue around the needle tip will be heated by an electric current that comes from the radiofrequency machine. This will numb the nerves. The needle will be removed. A bandage (dressing) will be put on the insertion area. The procedure may vary among health care providers and hospitals. What happens after the procedure? Your blood pressure, heart rate, breathing rate, and blood oxygen level will be monitored until you  leave the hospital or clinic. Return to your normal activities as told by your health care provider. Ask your health care provider what activities are safe for you. If you were given a sedative during the procedure, it can affect you for several hours. Do not drive or operate machinery until your health care provider says that it is safe. Summary Radiofrequency ablation is a procedure that is performed to relieve pain. The procedure is often used for back, neck, or arm pain. Radiofrequency ablation involves the use of a machine that creates radio waves to make heat. Plan to have a responsible adult take you home from the hospital or clinic. Do not drive or operate machinery until your health care provider says that it is safe. Return to your normal activities as told by your health care provider. Ask your health care provider what activities are safe for you. This information is not intended to replace advice given to you by your health care provider. Make sure you discuss any questions you have with your health care provider. Document Revised: 07/18/2020 Document Reviewed: 07/18/2020 Elsevier Patient Education  [place].

## 2021-09-27 NOTE — Progress Notes (Signed)
Nursing Pain Medication Assessment:  Safety precautions to be maintained throughout the outpatient stay will include: orient to surroundings, keep bed in low position, maintain call bell within reach at all times, provide assistance with transfer out of bed and ambulation.  Medication Inspection Compliance: Pill count conducted under aseptic conditions, in front of the patient. Neither the pills nor the bottle was removed from the patient's sight at any time. Once count was completed pills were immediately returned to the patient in their original bottle.  Medication: Oxycodone IR Pill/Patch Count:  38 of 60 pills remain Pill/Patch Appearance: Markings consistent with prescribed medication Bottle Appearance: Standard pharmacy container. Clearly labeled. Filled Date: 07 / 22 / 2023 Last Medication intake:  Today

## 2021-10-02 ENCOUNTER — Telehealth: Payer: Self-pay

## 2021-10-02 NOTE — Chronic Care Management (AMB) (Signed)
  Chronic Care Management Note  10/02/2021 Name: Charles Ewing MRN: [id number] DOB: May 29, 1940  Charles Ewing is a 81 y.o. year old male who is a primary care patient of Olin Hauser, DO and is actively engaged with the care management team. I reached out to Liana Gerold by phone today to assist with re-scheduling a follow up visit with the Pharmacist  Follow up plan: Unsuccessful telephone outreach attempt made. A HIPAA compliant phone message was left for the patient providing contact information and requesting a return call.  The care management team will reach out to the patient again over the next 7 days.  If patient returns call to provider office, please advise to call [facility]  at [address], [place], [place] [address] Direct Dial: [phone] [email or web address]

## 2021-10-05 ENCOUNTER — Other Ambulatory Visit: Payer: Self-pay

## 2021-10-05 ENCOUNTER — Telehealth: Payer: Self-pay | Admitting: Family Medicine

## 2021-10-05 ENCOUNTER — Other Ambulatory Visit: Payer: Self-pay | Admitting: Family Medicine

## 2021-10-05 DIAGNOSIS — E785 Hyperlipidemia, unspecified: Secondary | ICD-10-CM

## 2021-10-05 MED ORDER — SIMVASTATIN 20 MG PO TABS: 20.0000 mg | ORAL_TABLET | Freq: Every day | ORAL | 0 refills | Status: DC

## 2021-10-05 NOTE — Telephone Encounter (Signed)
Refilled 08/27/2021 #90 0 refills - confirmed by same pharmacy. Requested Prescriptions  Pending Prescriptions Disp Refills  . simvastatin (ZOCOR) 20 MG tablet [Pharmacy Med Name: SIMVASTATIN 20 MG TAB] 90 tablet 0    Sig: TAKE 1 TABLET BY MOUTH AT BEDTIME     Cardiovascular:  Antilipid - Statins Failed - 10/05/2021 11:25 AM      Failed - Lipid Panel in normal range within the last 12 months    Cholesterol  Date Value Ref Range Status  10/30/2020 96 <200 mg/dL Final   LDL Cholesterol (Calc)  Date Value Ref Range Status  10/30/2020 39 mg/dL (calc) Final    Comment:    Reference range: <100 . Desirable range <100 mg/dL for primary prevention;   <70 mg/dL for patients with CHD or diabetic patients  with > or = 2 CHD risk factors. Marland Kitchen LDL-C is now calculated using the Martin-Hopkins  calculation, which is a validated novel method providing  better accuracy than the Friedewald equation in the  estimation of LDL-C.  Cresenciano Genre et al. Annamaria Helling. 9485;462(70): 2061-2068  (http://education.[employer].com/faq/FAQ164)    HDL  Date Value Ref Range Status  10/30/2020 40 > OR = 40 mg/dL Final   Triglycerides  Date Value Ref Range Status  10/30/2020 87 <150 mg/dL Final         Passed - Patient is not pregnant      Passed - Valid encounter within last 12 months    Recent Outpatient Visits          2 months ago Benign hypertension with CKD (chronic kidney disease) stage III [facility])   [facility] Olin Hauser, DO   7 months ago Chronic right-sided low back pain with bilateral sciatica   [facility], DO   9 months ago Acute intractable headache, unspecified headache type   [facility] [place], Coralie Keens, NP   11 months ago Annual physical exam   [facility], DO   1 year ago Bilateral lower extremity edema   [place], Devonne Doughty, DO

## 2021-10-09 NOTE — Chronic Care Management (AMB) (Unsigned)
  Chronic Care Management Note  10/09/2021 Name: Charles Ewing MRN: [id number] DOB: 08/10/1940  Charles Ewing is a 81 y.o. year old male who is a primary care patient of Olin Hauser, DO and is actively engaged with the care management team. I reached out to Liana Gerold by phone today to assist with re-scheduling a follow up visit with the Pharmacist  Follow up plan: Unsuccessful telephone outreach attempt made. A HIPAA compliant phone message was left for the patient providing contact information and requesting a return call.  The care management team will reach out to the patient again over the next 7 days.  If patient returns call to provider office, please advise to call [facility]  at [place] [address], [place], [place] [address] Direct Dial: [phone] [email or web address]

## 2021-10-11 DIAGNOSIS — I2510 Atherosclerotic heart disease of native coronary artery without angina pectoris: Secondary | ICD-10-CM | POA: Diagnosis not present

## 2021-10-11 DIAGNOSIS — F32A Depression, unspecified: Secondary | ICD-10-CM

## 2021-10-11 DIAGNOSIS — E785 Hyperlipidemia, unspecified: Secondary | ICD-10-CM | POA: Diagnosis not present

## 2021-10-11 DIAGNOSIS — J449 Chronic obstructive pulmonary disease, unspecified: Secondary | ICD-10-CM

## 2021-10-11 NOTE — Chronic Care Management (AMB) (Signed)
  Chronic Care Management Note  10/11/2021 Name: Charles Ewing MRN: [id number] DOB: [birth year]  Charles Ewing is a 81 y.o. year old male who is a primary care patient of Olin Hauser, DO and is actively engaged with the care management team. I reached out to Liana Gerold by phone today to assist with re-scheduling a follow up visit with the Pharmacist  Follow up plan: Telephone appointment with care management team member scheduled for:10/19/2021  Noreene Larsson, [facility]  [place], [place] [address] Direct Dial: [phone] [email or web address]

## 2021-10-16 NOTE — Progress Notes (Signed)
PROVIDER NOTE: Interpretation of information contained herein should be left to medically-trained personnel. Specific patient instructions are provided elsewhere under "Patient Instructions" section of medical record. This document was created in part using STT-dictation technology, any transcriptional errors that may result from this process are unintentional.  Patient: Charles Ewing Type: Established DOB: 06/04/1940 MRN: [id number] PCP: Olin Hauser, DO  Service: Procedure DOS: 10/18/2021 Setting: Ambulatory Location: Ambulatory outpatient facility Delivery: Face-to-face Provider: Gaspar Cola, MD Specialty: Interventional Pain Management Specialty designation: 09 Location: Outpatient facility Ref. Prov.: Milinda Pointer, MD    Procedure:           Type: Lumbar Facet, Medial Branch Radiofrequency Ablation (RFA) #1  Laterality: Right (-RT)  Level: L2, L3, L4, L5, & S1 Medial Branch Level(s). These levels will denervate the L3-4, L4-5 and L5-S1 lumbar facet joints.  Imaging: Fluoroscopy-guided         Anesthesia: Local anesthesia (1-2% Lidocaine) Anxiolysis: IV Versed 1.5 mg Sedation: Moderate Sedation  Fentanyl 25 mcg .  DOS: 10/18/2021  Performed by: Gaspar Cola, MD  Purpose: Therapeutic/Palliative Indications: Low back pain severe enough to impact quality of life or function. Indications: 1. Lumbar facet syndrome   2. Spondylosis without myelopathy or radiculopathy, lumbosacral region   3. DDD (degenerative disc disease), lumbosacral   4. Chronic low back pain (1ry area of Pain) (Bilateral) (R>L) w/o sciatica    Charles Ewing has been dealing with the above chronic pain for longer than three months and has either failed to respond, was unable to tolerate, or simply did not get enough benefit from other more conservative therapies including, but not limited to: 1. Over-the-counter medications 2. Anti-inflammatory medications 3. Muscle relaxants 4.  Membrane stabilizers 5. Opioids 6. Physical therapy and/or chiropractic manipulation 7. Modalities (Heat, ice, etc.) 8. Invasive techniques such as nerve blocks. Charles Ewing has attained more than 50% relief of the pain from a series of diagnostic injections conducted in separate occasions.  Pain Score: Pre-procedure: 6 /10 Post-procedure: 0-No pain/10     Position / Prep / Materials:  Position: Prone  Prep solution: DuraPrep (Iodine Povacrylex [0.7% available iodine] and Isopropyl Alcohol, 74% w/w) Prep Area: Entire Lumbosacral Region (Lower back from mid-thoracic region to end of tailbone and from flank to flank.) Materials:  Tray: RFA (Radiofrequency) tray Needle(s):  Type: RFA (Teflon-coated radiofrequency ablation needles) Gauge (G): 22  Length: Regular (10cm) Qty: 5  Pre-op H&P Assessment:  Charles Ewing is a 81 y.o. (year old), male patient, seen today for interventional treatment. He  has a past surgical history that includes Skin cancer excision; Colon polyp surgery; Transurethral resection of prostate (2010); Cataract extraction w/PHACO (Left, 05/09/2020); and Cataract extraction w/PHACO (Right, 05/23/2020). Charles Ewing has a current medication list which includes the following prescription(s): albuterol, amlodipine, aspirin ec, vitamin d3, fluoxetine, fluticasone, furosemide, gabapentin, hydrocodone-acetaminophen, [START ON 10/24/2021] hydrocodone-acetaminophen, lorazepam, losartan, meloxicam, oxycodone, [START ON 11/01/2021] oxycodone, pantoprazole, potassium chloride, simvastatin, tamsulosin, spiriva respimat, and oxycodone, and the following Facility-Administered Medications: fentanyl and pentafluoroprop-tetrafluoroeth. His primarily concern today is the Back Pain (lower)  Initial Vital Signs:  Pulse/HCG Rate: 78ECG Heart Rate: 77 (NSR) Temp: (!) 97.2 F (36.2 C) Resp: 16 BP: 135/72 SpO2: 99 %  BMI: Estimated body mass index is 26.5 kg/m as calculated from the following:    Height as of this encounter: '5\' 11"'$  (1.803 m).   Weight as of this encounter: 190 lb (86.2 kg).  Risk Assessment: Allergies: Reviewed. He is allergic to nexium [esomeprazole magnesium].  Allergy Precautions: None required Coagulopathies: Reviewed. None identified.  Blood-thinner therapy: None at this time Active Infection(s): Reviewed. None identified. Charles Ewing is afebrile  Site Confirmation: Charles Ewing was asked to confirm the procedure and laterality before marking the site Procedure checklist: Completed Consent: Before the procedure and under the influence of no sedative(s), amnesic(s), or anxiolytics, the patient was informed of the treatment options, risks and possible complications. To fulfill our ethical and legal obligations, as recommended by the American Medical Association's Code of Ethics, I have informed the patient of my clinical impression; the nature and purpose of the treatment or procedure; the risks, benefits, and possible complications of the intervention; the alternatives, including doing nothing; the risk(s) and benefit(s) of the alternative treatment(s) or procedure(s); and the risk(s) and benefit(s) of doing nothing. The patient was provided information about the general risks and possible complications associated with the procedure. These may include, but are not limited to: failure to achieve desired goals, infection, bleeding, organ or nerve damage, allergic reactions, paralysis, and death. In addition, the patient was informed of those risks and complications associated to Spine-related procedures, such as failure to decrease pain; infection (i.e.: Meningitis, epidural or intraspinal abscess); bleeding (i.e.: epidural hematoma, subarachnoid hemorrhage, or any other type of intraspinal or peri-dural bleeding); organ or nerve damage (i.e.: Any type of peripheral nerve, nerve root, or spinal cord injury) with subsequent damage to sensory, motor, and/or autonomic systems,  resulting in permanent pain, numbness, and/or weakness of one or several areas of the body; allergic reactions; (i.e.: anaphylactic reaction); and/or death. Furthermore, the patient was informed of those risks and complications associated with the medications. These include, but are not limited to: allergic reactions (i.e.: anaphylactic or anaphylactoid reaction(s)); adrenal axis suppression; blood sugar elevation that in diabetics may result in ketoacidosis or comma; water retention that in patients with history of congestive heart failure may result in shortness of breath, pulmonary edema, and decompensation with resultant heart failure; weight gain; swelling or edema; medication-induced neural toxicity; particulate matter embolism and blood vessel occlusion with resultant organ, and/or nervous system infarction; and/or aseptic necrosis of one or more joints. Finally, the patient was informed that Medicine is not an exact science; therefore, there is also the possibility of unforeseen or unpredictable risks and/or possible complications that may result in a catastrophic outcome. The patient indicated having understood very clearly. We have given the patient no guarantees and we have made no promises. Enough time was given to the patient to ask questions, all of which were answered to the patient's satisfaction. Mr. Deboard has indicated that he wanted to continue with the procedure. Attestation: I, the ordering provider, attest that I have discussed with the patient the benefits, risks, side-effects, alternatives, likelihood of achieving goals, and potential problems during recovery for the procedure that I have provided informed consent. Date  Time: 10/18/2021  7:49 AM  Pre-Procedure Preparation:  Monitoring: As per clinic protocol. Respiration, ETCO2, SpO2, BP, heart rate and rhythm monitor placed and checked for adequate function Safety Precautions: Patient was assessed for positional comfort and pressure  points before starting the procedure. Time-out: I initiated and conducted the "Time-out" before starting the procedure, as per protocol. The patient was asked to participate by confirming the accuracy of the "Time Out" information. Verification of the correct person, site, and procedure were performed and confirmed by me, the nursing staff, and the patient. "Time-out" conducted as per Joint Commission's Universal Protocol (UP.01.01.01). Time: 8588  Description of Procedure:  Laterality: Right Levels:  L2, L3, L4, L5, & S1 Medial Branch Level(s). Safety Precautions: Aspiration looking for blood return was conducted prior to all injections. At no point did we inject any substances, as a needle was being advanced. Before injecting, the patient was told to immediately notify me if he was experiencing any new onset of "ringing in the ears, or metallic taste in the mouth". No attempts were made at seeking any paresthesias. Safe injection practices and needle disposal techniques used. Medications properly checked for expiration dates. SDV (single dose vial) medications used. After the completion of the procedure, all disposable equipment used was discarded in the proper designated medical waste containers. Local Anesthesia: Protocol guidelines were followed. The patient was positioned over the fluoroscopy table. The area was prepped in the usual manner. The time-out was completed. The target area was identified using fluoroscopy. A 12-in long, straight, sterile hemostat was used with fluoroscopic guidance to locate the targets for each level blocked. Once located, the skin was marked with an approved surgical skin marker. Once all sites were marked, the skin (epidermis, dermis, and hypodermis), as well as deeper tissues (fat, connective tissue and muscle) were infiltrated with a small amount of a short-acting local anesthetic, loaded on a 10cc syringe with a 25G, 1.5-in  Needle. An appropriate amount of  time was allowed for local anesthetics to take effect before proceeding to the next step. Technical description of process:  Radiofrequency Ablation (RFA) L2 Medial Branch Nerve RFA: The target area for the L2 medial branch is at the junction of the postero-lateral aspect of the superior articular process and the superior, posterior, and medial edge of the transverse process of L3. Under fluoroscopic guidance, a Radiofrequency needle was inserted until contact was made with os over the superior postero-lateral aspect of the pedicular shadow (target area). Sensory and motor testing was conducted to properly adjust the position of the needle. Once satisfactory placement of the needle was achieved, the numbing solution was slowly injected after negative aspiration for blood. 2.0 mL of the nerve block solution was injected without difficulty or complication. After waiting for at least 3 minutes, the ablation was performed. Once completed, the needle was removed intact. L3 Medial Branch Nerve RFA: The target area for the L3 medial branch is at the junction of the postero-lateral aspect of the superior articular process and the superior, posterior, and medial edge of the transverse process of L4. Under fluoroscopic guidance, a Radiofrequency needle was inserted until contact was made with os over the superior postero-lateral aspect of the pedicular shadow (target area). Sensory and motor testing was conducted to properly adjust the position of the needle. Once satisfactory placement of the needle was achieved, the numbing solution was slowly injected after negative aspiration for blood. 2.0 mL of the nerve block solution was injected without difficulty or complication. After waiting for at least 3 minutes, the ablation was performed. Once completed, the needle was removed intact. L4 Medial Branch Nerve RFA: The target area for the L4 medial branch is at the junction of the postero-lateral aspect of the superior  articular process and the superior, posterior, and medial edge of the transverse process of L5. Under fluoroscopic guidance, a Radiofrequency needle was inserted until contact was made with os over the superior postero-lateral aspect of the pedicular shadow (target area). Sensory and motor testing was conducted to properly adjust the position of the needle. Once satisfactory placement of the needle was achieved, the numbing solution was slowly injected after  negative aspiration for blood. 2.0 mL of the nerve block solution was injected without difficulty or complication. After waiting for at least 3 minutes, the ablation was performed. Once completed, the needle was removed intact. L5 Medial Branch Nerve RFA: The target area for the L5 medial branch is at the junction of the postero-lateral aspect of the superior articular process of S1 and the superior, posterior, and medial edge of the sacral ala. Under fluoroscopic guidance, a Radiofrequency needle was inserted until contact was made with os over the superior postero-lateral aspect of the pedicular shadow (target area). Sensory and motor testing was conducted to properly adjust the position of the needle. Once satisfactory placement of the needle was achieved, the numbing solution was slowly injected after negative aspiration for blood. 2.0 mL of the nerve block solution was injected without difficulty or complication. After waiting for at least 3 minutes, the ablation was performed. Once completed, the needle was removed intact. S1 Medial Branch Nerve RFA: The target area for the S1 medial branch is located inferior to the junction of the S1 superior articular process and the L5 inferior articular process, posterior, inferior, and lateral to the 6 o'clock position of the L5-S1 facet joint, just superior to the S1 posterior foramen. Under fluoroscopic guidance, the Radiofrequency needle was advanced until contact was made with os over the Target area. Sensory  and motor testing was conducted to properly adjust the position of the needle. Once satisfactory placement of the needle was achieved, the numbing solution was slowly injected after negative aspiration for blood. 2.0 mL of the nerve block solution was injected without difficulty or complication. After waiting for at least 3 minutes, the ablation was performed. Once completed, the needle was removed intact. Radiofrequency lesioning (ablation):  Radiofrequency Generator: Medtronic AccurianTM AG 1000 RF Generator Sensory Stimulation Parameters: 50 Hz was used to locate & identify the nerve, making sure that the needle was positioned such that there was no sensory stimulation below 0.3 V or above 0.7 V. Motor Stimulation Parameters: 2 Hz was used to evaluate the motor component. Care was taken not to lesion any nerves that demonstrated motor stimulation of the lower extremities at an output of less than 2.5 times that of the sensory threshold, or a maximum of 2.0 V. Lesioning Technique Parameters: Standard Radiofrequency settings. (Not bipolar or pulsed.) Temperature Settings: 80 degrees C Lesioning time: 60 seconds Intra-operative Compliance: Compliant  Once the entire procedure was completed, the treated area was cleaned, making sure to leave some of the prepping solution back to take advantage of its long term bactericidal properties.    Illustration of the posterior view of the lumbar spine and the posterior neural structures. Laminae of L2 through S1 are labeled. DPRL5, dorsal primary ramus of L5; DPRS1, dorsal primary ramus of S1; DPR3, dorsal primary ramus of L3; FJ, facet (zygapophyseal) joint L3-L4; I, inferior articular process of L4; LB1, lateral branch of dorsal primary ramus of L1; IAB, inferior articular branches from L3 medial branch (supplies L4-L5 facet joint); IBP, intermediate branch plexus; MB3, medial branch of dorsal primary ramus of L3; NR3, third lumbar nerve root; S, superior  articular process of L5; SAB, superior articular branches from L4 (supplies L4-5 facet joint also); TP3, transverse process of L3.  Vitals:   10/18/21 0909 10/18/21 0915 10/18/21 0925 10/18/21 0937  BP: (!) 170/88 (!) 172/86 (!) 169/82 (!) 170/80  Pulse:      Resp: '16 16 15 16  '$ Temp:  (!) 97.2 F (36.2  C)  (!) 97.3 F (36.3 C)  SpO2: 100% 100% 100% 100%  Weight:      Height:        Start Time: 0836 hrs. End Time: 0909 hrs.  Imaging Guidance (Spinal):          Type of Imaging Technique: Fluoroscopy Guidance (Spinal) Indication(s): Assistance in needle guidance and placement for procedures requiring needle placement in or near specific anatomical locations not easily accessible without such assistance. Exposure Time: Please see nurses notes. Contrast: None used. Fluoroscopic Guidance: I was personally present during the use of fluoroscopy. "Tunnel Vision Technique" used to obtain the best possible view of the target area. Parallax error corrected before commencing the procedure. "Direction-depth-direction" technique used to introduce the needle under continuous pulsed fluoroscopy. Once target was reached, antero-posterior, oblique, and lateral fluoroscopic projection used confirm needle placement in all planes. Images permanently stored in EMR. Interpretation: No contrast injected. I personally interpreted the imaging intraoperatively. Adequate needle placement confirmed in multiple planes. Permanent images saved into the patient's record.  Antibiotic Prophylaxis:   Anti-infectives (From admission, onward)    None      Indication(s): None identified  Post-operative Assessment:  Post-procedure Vital Signs:  Pulse/HCG Rate: 7880 Temp: (!) 97.3 F (36.3 C) Resp: 16 BP: (!) 170/80 SpO2: 100 %  EBL: None  Complications: No immediate post-treatment complications observed by team, or reported by patient.  Note: The patient tolerated the entire procedure well. A repeat set of  vitals were taken after the procedure and the patient was kept under observation following institutional policy, for this type of procedure. Post-procedural neurological assessment was performed, showing return to baseline, prior to discharge. The patient was provided with post-procedure discharge instructions, including a section on how to identify potential problems. Should any problems arise concerning this procedure, the patient was given instructions to immediately contact us, at any time, without hesitation. In any case, we plan to contact the patient by telephone for a follow-up status report regarding this interventional procedure.  Comments:  No additional relevant information.  Plan of Care  Orders:  Orders Placed This Encounter  Procedures   Radiofrequency,Lumbar    Scheduling Instructions:     Side(s): Right-sided     Level: L3-4, L4-5, & L5-S1 Facets (L2, L3, L4, L5, & S1 Medial Branch Nerves)     Sedation: With Sedation.     Timeframe: Today    Order Specific Question:   Where will this procedure be performed?    Answer:   [facility]   [facility] C-ARM 1-60 MIN NO REPORT    Intraoperative interpretation by procedural physician at [place].    Standing Status:   Standing    Number of Occurrences:   1    Order Specific Question:   Reason for exam:    Answer:   Assistance in needle guidance and placement for procedures requiring needle placement in or near specific anatomical locations not easily accessible without such assistance.   Informed Consent Details: Physician/Practitioner Attestation; Transcribe to consent form and obtain patient signature    Nursing Order: Transcribe to consent form and obtain patient signature. Note: Always confirm laterality of pain with Mr. Edelen, before procedure.    Order Specific Question:   Physician/Practitioner attestation of informed consent for procedure/surgical case    Answer:   I, the physician/practitioner,  attest that I have discussed with the patient the benefits, risks, side effects, alternatives, likelihood of achieving goals and potential problems during  recovery for the procedure that I have provided informed consent.    Order Specific Question:   Procedure    Answer:   Lumbar Facet Radiofrequency Ablation    Order Specific Question:   Physician/Practitioner performing the procedure    Answer:   Coraima Tibbs A. Dossie Arbour, MD    Order Specific Question:   Indication/Reason    Answer:   Low Back Pain, with our without leg pain, due to Facet Joint Arthralgia (Joint Pain) known as Lumbar Facet Syndrome, secondary to Lumbar, and/or Lumbosacral Spondylosis (Arthritis of the Spine), without myelopathy or radiculopathy (Nerve Damage).   Provide equipment / supplies at bedside    "Radiofrequency Tray"; Large hemostat (x1); Small hemostat (x1); Towels (x8); 4x4 sterile sponge pack (x1) Needle type: Teflon-coated Radiofrequency Needle (Disposable  single use) Size: Regular Quantity: 5    Standing Status:   Standing    Number of Occurrences:   1    Order Specific Question:   Specify    Answer:   Radiofrequency Tray   Chronic Opioid Analgesic:  Oxycodone IR 5 mg tablet, 1 tab p.o. 3 times daily (# 60) (last filled on 07/04/2021) MME/day: 22.5 mg/day   Medications ordered for procedure: Meds ordered this encounter  Medications   lidocaine (XYLOCAINE) 2 % (with pres) injection 400 mg   pentafluoroprop-tetrafluoroeth (GEBAUERS) aerosol   lactated ringers infusion   midazolam (VERSED) 5 MG/5ML injection 0.5-2 mg    Make sure Flumazenil is available in the pyxis when using this medication. If oversedation occurs, administer 0.2 mg IV over 15 sec. If after 45 sec no response, administer 0.2 mg again over 1 min; may repeat at 1 min intervals; not to exceed 4 doses (1 mg)   fentaNYL (SUBLIMAZE) injection 25-50 mcg    Make sure Narcan is available in the pyxis when using this medication. In the event of  respiratory depression (RR< 8/min): Titrate NARCAN (naloxone) in increments of 0.1 to 0.2 mg IV at 2-3 minute intervals, until desired degree of reversal.   ropivacaine (PF) 2 mg/mL (0.2%) (NAROPIN) injection 9 mL   triamcinolone acetonide (KENALOG-40) injection 40 mg   HYDROcodone-acetaminophen (NORCO/VICODIN) 5-325 MG tablet    Sig: Take 1 tablet by mouth every 8 (eight) hours as needed for up to 7 days for severe pain. Must last 7 days.    Dispense:  21 tablet    Refill:  0    For acute post-operative pain. Not to be refilled. Must last 7 days.   HYDROcodone-acetaminophen (NORCO/VICODIN) 5-325 MG tablet    Sig: Take 1 tablet by mouth every 8 (eight) hours as needed for up to 7 days for severe pain. Must last for 7 days.    Dispense:  21 tablet    Refill:  0    For acute post-operative pain. Not to be refilled. Must last 7 days.   Medications administered: We administered lidocaine, lactated ringers, midazolam, fentaNYL, ropivacaine (PF) 2 mg/mL (0.2%), and triamcinolone acetonide.  See the medical record for exact dosing, route, and time of administration.  Follow-up plan:   Return in about 6 weeks (around 11/29/2021) for Proc-day (T,Th), (F2F), (PPE).       Interventional Therapies  Risk  Complexity Considerations:   Estimated body mass index is 28.29 kg/m as calculated from the following:   Height as of this encounter: 5' 10.5" (1.791 m).   Weight as of this encounter: 200 lb (90.7 kg). History of: abdominal aortic aneurysm (AAA); hypertension; cardiomyopathy; coronary artery disease; dissection of  abdominal aorta; orthostatic hypotension; irregular heart rhythm; chronic kidney disease; shortness of breath; tobacco abuse; COPD; GERD   Planned  Pending:      Under consideration:      Completed:   Therapeutic right lumbar facet RFA x1 (10/18/2021)  Diagnostic right lumbar facet MBB x2 (09/13/2021) (100/100/50/50)    Completed by other providers:   Diagnostic/therapeutic  bilateral IA steroid knee inj. (03/15/2016) by Carlynn Spry, PA Rosanne Gutting)    Therapeutic  Palliative (PRN) options:   Therapeutic right lumbar facet MBB     Recent Visits Date Type Provider Dept  09/27/21 Office Visit Milinda Pointer, MD [facility]  09/13/21 Procedure visit Milinda Pointer, MD [facility]  08/27/21 Office Visit Milinda Pointer, MD [facility]  08/09/21 Procedure visit Milinda Pointer, MD [facility]  07/23/21 Office Visit Milinda Pointer, MD [facility]  Showing recent visits within past 90 days and meeting all other requirements Today's Visits Date Type Provider Dept  10/18/21 Procedure visit Milinda Pointer, MD [facility]  Showing today's visits and meeting all other requirements Future Appointments Date Type Provider Dept  11/28/21 Appointment Milinda Pointer, MD [facility]  11/29/21 Appointment Milinda Pointer, MD [facility]  Showing future appointments within next 90 days and meeting all other requirements  Disposition: Discharge home  Discharge (Date  Time): 10/18/2021; 0943 hrs.   Primary Care Physician: Olin Hauser, DO Location: [facility] Note by: Gaspar Cola, MD Date: 10/18/2021; Time: 12:20 PM  Disclaimer:  Medicine is not an Chief Strategy Officer. The only guarantee in medicine is that nothing is guaranteed. It is important to note that the decision to proceed with this intervention was based on the information collected from the patient. The Data and conclusions were drawn from the patient's questionnaire, the interview, and the physical examination. Because the information was provided in large part by the patient, it cannot be guaranteed that it has not been purposely or unconsciously manipulated. Every effort has been made to obtain as much relevant data as possible for this evaluation. It is  important to note that the conclusions that lead to this procedure are derived in large part from the available data. Always take into account that the treatment will also be dependent on availability of resources and existing treatment guidelines, considered by other Pain Management Practitioners as being common knowledge and practice, at the time of the intervention. For Medico-Legal purposes, it is also important to point out that variation in procedural techniques and pharmacological choices are the acceptable norm. The indications, contraindications, technique, and results of the above procedure should only be interpreted and judged by a Board-Certified Interventional Pain Specialist with extensive familiarity and expertise in the same exact procedure and technique.

## 2021-10-18 ENCOUNTER — Ambulatory Visit
Admission: RE | Admit: 2021-10-18 | Discharge: 2021-10-18 | Disposition: A | Discharge status: Home / Self Care | Payer: Medicare Other | Source: Ambulatory Visit | Attending: Pain Medicine | Admitting: Pain Medicine

## 2021-10-18 ENCOUNTER — Ambulatory Visit: Payer: Medicare Other | Attending: Pain Medicine | Admitting: Pain Medicine

## 2021-10-18 ENCOUNTER — Ambulatory Visit (INDEPENDENT_AMBULATORY_CARE_PROVIDER_SITE_OTHER): Payer: Medicare Other

## 2021-10-18 ENCOUNTER — Encounter: Payer: Self-pay | Admitting: Pain Medicine

## 2021-10-18 ENCOUNTER — Other Ambulatory Visit: Payer: Self-pay | Admitting: Family Medicine

## 2021-10-18 VITALS — BP 170/80 | HR 78 | Temp 97.3°F | Resp 16 | Ht 71.0 in | Wt 190.0 lb

## 2021-10-18 DIAGNOSIS — J432 Centrilobular emphysema: Secondary | ICD-10-CM

## 2021-10-18 DIAGNOSIS — G8929 Other chronic pain: Secondary | ICD-10-CM

## 2021-10-18 DIAGNOSIS — I2510 Atherosclerotic heart disease of native coronary artery without angina pectoris: Secondary | ICD-10-CM

## 2021-10-18 DIAGNOSIS — M5137 Other intervertebral disc degeneration, lumbosacral region: Secondary | ICD-10-CM | POA: Diagnosis not present

## 2021-10-18 DIAGNOSIS — M47817 Spondylosis without myelopathy or radiculopathy, lumbosacral region: Secondary | ICD-10-CM

## 2021-10-18 DIAGNOSIS — M545 Low back pain, unspecified: Secondary | ICD-10-CM

## 2021-10-18 DIAGNOSIS — G8918 Other acute postprocedural pain: Secondary | ICD-10-CM | POA: Diagnosis not present

## 2021-10-18 DIAGNOSIS — M47816 Spondylosis without myelopathy or radiculopathy, lumbar region: Secondary | ICD-10-CM

## 2021-10-18 DIAGNOSIS — G894 Chronic pain syndrome: Secondary | ICD-10-CM

## 2021-10-18 DIAGNOSIS — E785 Hyperlipidemia, unspecified: Secondary | ICD-10-CM

## 2021-10-18 MED ORDER — ROPIVACAINE HCL 2 MG/ML IJ SOLN
INTRAMUSCULAR | Status: AC
  Filled 2021-10-18: qty 20

## 2021-10-18 MED ORDER — HYDROCODONE-ACETAMINOPHEN 5-325 MG PO TABS: 1.0000 | ORAL_TABLET | Freq: Three times a day (TID) | ORAL | 0 refills | Status: AC | PRN

## 2021-10-18 MED ORDER — FENTANYL CITRATE (PF) 100 MCG/2ML IJ SOLN
INTRAMUSCULAR | Status: AC
  Filled 2021-10-18: qty 2

## 2021-10-18 MED ORDER — MIDAZOLAM HCL 5 MG/5ML IJ SOLN
0.5000 mg | Freq: Once | INTRAMUSCULAR | Status: AC
  Administered 2021-10-18: 1.5 mg via INTRAVENOUS

## 2021-10-18 MED ORDER — FENTANYL CITRATE (PF) 100 MCG/2ML IJ SOLN
25.0000 ug | INTRAMUSCULAR | Status: DC | PRN
  Administered 2021-10-18: 25 ug via INTRAVENOUS

## 2021-10-18 MED ORDER — LACTATED RINGERS IV SOLN: Freq: Once | INTRAVENOUS | Status: AC

## 2021-10-18 MED ORDER — MIDAZOLAM HCL 5 MG/5ML IJ SOLN
INTRAMUSCULAR | Status: AC
  Filled 2021-10-18: qty 5

## 2021-10-18 MED ORDER — TRIAMCINOLONE ACETONIDE 40 MG/ML IJ SUSP
INTRAMUSCULAR | Status: AC
  Filled 2021-10-18: qty 1

## 2021-10-18 MED ORDER — PENTAFLUOROPROP-TETRAFLUOROETH EX AERO
INHALATION_SPRAY | Freq: Once | CUTANEOUS | Status: DC
  Filled 2021-10-18: qty 116

## 2021-10-18 MED ORDER — LIDOCAINE HCL 2 % IJ SOLN
INTRAMUSCULAR | Status: AC
  Filled 2021-10-18: qty 20

## 2021-10-18 MED ORDER — ROPIVACAINE HCL 2 MG/ML IJ SOLN
9.0000 mL | Freq: Once | INTRAMUSCULAR | Status: AC
  Administered 2021-10-18: 9 mL via PERINEURAL

## 2021-10-18 MED ORDER — TRIAMCINOLONE ACETONIDE 40 MG/ML IJ SUSP
40.0000 mg | Freq: Once | INTRAMUSCULAR | Status: AC
  Administered 2021-10-18: 40 mg

## 2021-10-18 MED ORDER — LIDOCAINE HCL 2 % IJ SOLN
20.0000 mL | Freq: Once | INTRAMUSCULAR | Status: AC
  Administered 2021-10-18: 400 mg

## 2021-10-18 NOTE — Progress Notes (Signed)
Safety precautions to be maintained throughout the outpatient stay will include: orient to surroundings, keep bed in low position, maintain call bell within reach at all times, provide assistance with transfer out of bed and ambulation.  

## 2021-10-18 NOTE — Chronic Care Management (AMB) (Addendum)
Chronic Care Management   CCM RN Visit Note  10/18/2021 Name: Charles Ewing MRN: [id number] DOB: [birth year]  Subjective: Charles Ewing is a 81 y.o. year old male who is a primary care patient of Olin Hauser, DO. The care management team was consulted for assistance with disease management and care coordination needs.    Engaged with patient by telephone for follow up visit in response to provider referral for case management and/or care coordination services.   Consent to Services:  The patient was given information about Chronic Care Management services, agreed to services, and gave verbal consent prior to initiation of services.  Please see initial visit note for detailed documentation.   Patient agreed to services and verbal consent obtained.   Assessment: Review of patient past medical history, allergies, medications, health status, including review of consultants reports, laboratory and other test data, was performed as part of comprehensive evaluation and provision of chronic care management services.   SDOH (Social Determinants of Health) assessments and interventions performed:  SDOH Interventions    Flowsheet Row Chronic Care Management from 10/18/2021 in [facility] from 08/06/2021 in [facility]  SDOH Interventions    Food Insecurity Interventions -- Intervention Not Indicated  Housing Interventions Intervention Not Indicated Intervention Not Indicated  Transportation Interventions -- Intervention Not Indicated  Utilities Interventions Intervention Not Indicated --  Financial Strain Interventions -- Intervention Not Indicated  Physical Activity Interventions -- Intervention Not Indicated  Stress Interventions -- Intervention Not Indicated  Social Connections Interventions -- Intervention Not Indicated        CCM Care Plan  Allergies  Allergen Reactions   Nexium [Esomeprazole Magnesium]     Outpatient  Encounter Medications as of 10/18/2021  Medication Sig Note   albuterol (VENTOLIN HFA) 108 (90 Base) MCG/ACT inhaler Inhale 2 puffs into the lungs every 6 (six) hours as needed for wheezing or shortness of breath.    amLODipine (NORVASC) 5 MG tablet TAKE 1 TABLET BY MOUTH DAILY    aspirin EC 81 MG tablet Take 162 mg by mouth every evening.    Cholecalciferol (VITAMIN D3) 125 MCG (5000 UT) CAPS Take 1 capsule (5,000 Units total) by mouth daily with breakfast. Take along with calcium and magnesium.    FLUoxetine (PROZAC) 20 MG capsule Take 1 capsule (20 mg total) by mouth 2 (two) times daily.    fluticasone (FLONASE) 50 MCG/ACT nasal spray PLACE 2 SPRAYS INTO BOTH NOSTRILS DAILY (Patient taking differently: Place 2 sprays into both nostrils daily as needed.)    furosemide (LASIX) 20 MG tablet Take 1 tablet (20 mg total) by mouth every other day.    gabapentin (NEURONTIN) 100 MG capsule START 1 CAPSULE BY MOUTH DAILY, INCREASEBY 1 CAPSULE EVERY 2 TO 3 DAYS AS TOLERATED UP TO 3 TIMES A DAY, OR MAY TAKE 3 AT ONCE IN THE EVENING 08/27/2021: Taking 1 capsule three times daily   HYDROcodone-acetaminophen (NORCO/VICODIN) 5-325 MG tablet Take 1 tablet by mouth every 8 (eight) hours as needed for up to 7 days for severe pain. Must last 7 days.    [START ON 10/24/2021] HYDROcodone-acetaminophen (NORCO/VICODIN) 5-325 MG tablet Take 1 tablet by mouth every 8 (eight) hours as needed for up to 7 days for severe pain. Must last for 7 days. 10/18/2021: WARNING: Not a Duplicate. Future prescription. DO NOT DELETE during hospital medication reconciliation or at discharge. [facility] Chronic Pain Management Patient    LORazepam (ATIVAN) 0.5 MG tablet TAKE 1  TABLET BY MOUTH AT BEDTIME AS NEEDED FOR ANXIETY    losartan (COZAAR) 25 MG tablet TAKE 1 TABLET BY MOUTH DAILY    meloxicam (MOBIC) 15 MG tablet TAKE 1 TABLET BY MOUTH DAILY AS NEEDED FOR PAIN    oxyCODONE (OXY IR/ROXICODONE) 5 MG immediate release tablet Take 1 tablet (5 mg  total) by mouth 2 (two) times daily as needed for severe pain. Each refill must last 30 days.    oxyCODONE (OXY IR/ROXICODONE) 5 MG immediate release tablet Take 1 tablet (5 mg total) by mouth 2 (two) times daily as needed for severe pain. Each refill must last 30 days.    [START ON 11/01/2021] oxyCODONE (OXY IR/ROXICODONE) 5 MG immediate release tablet Take 1 tablet (5 mg total) by mouth 2 (two) times daily as needed for severe pain. Each refill must last 30 days. 08/27/2021: WARNING: Not a Duplicate. Future prescription. DO NOT DELETE during hospital medication reconciliation or at discharge. [facility] Chronic Pain Management Patient    pantoprazole (PROTONIX) 20 MG tablet Take 1 tablet by mouth daily.    potassium chloride (MICRO-K) 10 MEQ CR capsule Take 1 capsule (10 mEq total) by mouth every other day.    simvastatin (ZOCOR) 20 MG tablet Take 1 tablet (20 mg total) by mouth at bedtime.    tamsulosin (FLOMAX) 0.4 MG CAPS capsule TAKE 1 CAPSULE BY MOUTH DAILY AFTER BREAKFAST    Tiotropium Bromide Monohydrate (SPIRIVA RESPIMAT) 2.5 MCG/ACT AERS Inhale 2 each into the lungs daily.    [EXPIRED] lactated ringers infusion     [DISCONTINUED] fentaNYL (SUBLIMAZE) injection 25-50 mcg     [DISCONTINUED] pentafluoroprop-tetrafluoroeth (GEBAUERS) aerosol     No facility-administered encounter medications on file as of 10/18/2021.    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) 07/23/2021   Osteoarthritis of  hips (Bilateral) 07/23/2021   Vitamin D deficiency 07/23/2021   Vitamin B12 deficiency 07/23/2021   Long term prescription benzodiazepine use 06/13/2021   Chronic use of opiate for therapeutic purpose 06/13/2021   Long term current use of non-steroidal anti-inflammatories (NSAID) 06/13/2021   Chronic hip pain (2ry area of Pain) (Right) 06/13/2021   Chronic lower extremity pain (3ry area of Pain) (Right) 06/13/2021   Chronic cardiopulmonary disease (HCC) 06/13/2021   Chronic low back pain (1ry area of Pain) (Bilateral) (R>L) w/o sciatica 06/13/2021   Lumbar facet syndrome 06/13/2021   Depressive disorder 06/12/2021   Chronic pain syndrome 06/12/2021   Pharmacologic therapy 06/12/2021   Disorder of skeletal system 06/12/2021   Problems influencing health status 06/12/2021   Seborrheic keratosis 05/24/2021   Actinic keratosis 05/24/2021   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[place]) 05/24/2021   Basal cell carcinoma of nose 05/24/2021   Disorder of eustachian tube 05/24/2021   GERD (gastroesophageal reflux disease) 05/24/2021   Personal history of colonic polyps 05/24/2021   History of malignant neoplasm of skin 05/24/2021   Hypertensive heart disease without congestive heart failure 05/24/2021   Neoplasm of uncertain behavior of skin 05/24/2021   Other ill-defined and unknown causes of morbidity and mortality 05/24/2021   Other  specified counseling 05/24/2021   Healthcare maintenance 05/24/2021   Tobacco use disorder 05/24/2021   Verruca vulgaris 05/24/2021   Atypical chest pain 05/24/2021   Panic attack 05/24/2021   Panic disorder 05/24/2021   Depression    HTN (hypertension)    Nicotine dependence    Cardiomyopathy ([place])    PTSD (post-traumatic stress disorder) 11/06/2020   Venous insufficiency of both lower extremities 09/04/2020   Bilateral lower extremity edema 09/04/2020   Irregular heart rhythm 08/01/2016   Slow transit constipation 04/17/2016   Anxiety 04/17/2016   Insomnia  04/17/2016   Osteoarthritis of knees (Bilateral) 03/22/2016   SOB (shortness of breath) on exertion 12/13/2015   Left sided chest pain 12/11/2015   Coronary artery disease    Benign hypertension with CKD (chronic kidney disease) stage III ([place]) 11/17/2015   Abdominal aortic aneurysm (AAA) ([place]) 06/12/2015   Prediabetes 05/15/2015   Hyperlipidemia 05/15/2015   Centrilobular emphysema ([place]) 05/15/2015   Orthostatic hypotension 02/28/2015   Elevated PSA 04/14/2012   Benign prostatic hyperplasia with urinary obstruction 04/14/2012   False passage of urethra 04/14/2012    Conditions to be addressed/monitored:CAD, HLD, COPD, Osteoarthritis, and chronic pain  Care Plan : RNCM: General Plan of Care (Adult) for Chronic Disease Management and Care Coordination Needs  Updates made by Dimitri Ped, RN since 10/18/2021 12:00 AM     Problem: RNCM: Development of plan of care for Chronic Disease Management (HLD, CAD, COPD, Anxiety, Depression, Chronic Pain)   Priority: High     Long-Range Goal: RNCM: Effective Management of plan of care for Chronic Disease Management (HLD, CAD, COPD, Anxiety, Depression, Chronic Pain)   Start Date: 08/16/2021  Expected End Date: 08/17/2022  Priority: High  Note:   Current Barriers:  Knowledge Deficits related to plan of care for management of CAD, HLD, COPD, Chronic Pain, and anxiety and depression  Care Coordination needs related to Mental Health Concerns   Chronic Disease Management support and education needs related to CAD, HLD, COPD, Chronic Pain, and Anxiety and depression Lacks caregiver support.        Pt states he had a procedure for his back pain today and his pain is already better.  Denies any increase in SOB, chest pains or swelling.    RNCM Clinical Goal(s):  Patient will verbalize basic understanding of CAD, HLD, COPD, Anxiety, Depression, and Chronic pain  disease process and self health management plan as evidenced by routine visits with pcp  and specialist, following plan of care, working with the CCM team to effectively manage health and well being, VS stable, and compliance with dietary restrictions and medications management. take all medications exactly as prescribed and will call provider for medication related questions as evidenced by compliance with medications and calling for refills before running out of medications    attend all scheduled medical appointments: with pcp and specialist as evidenced by keeping appointments and calling for schedule change needs        work with pharmacist to address medication barriers and how to make sure he is effectively taking medications as prescribed related to CAD, HLD, COPD, Anxiety, Depression, and Chronic pain  as evidenced by review of EMR and patient or pharmacist report    demonstrate ongoing self health care management ability for effective management of Chronic conditions as evidenced by working with the CCM team through collaboration with Consulting civil engineer, provider, and care team.   Interventions: 1:1 collaboration with primary care provider regarding development and update of comprehensive  plan of care as evidenced by provider attestation and co-signature Inter-disciplinary care team collaboration (see longitudinal plan of care) Evaluation of current treatment plan related to  self management and patient's adherence to plan as established by provider   CAD Interventions: (Status:  Goal on track:  Yes.) Long Term Goal Assessed understanding of CAD diagnosis Medications reviewed including medications utilized in CAD treatment plan Provided education on importance of blood pressure control in management of CAD Provided education on Importance of limiting foods high in cholesterol Counseled on the importance of exercise goals with target of 150 minutes per week Reviewed Importance of taking all medications as prescribed   COPD Interventions:  (Status:  Goal on track:  Yes.) Long  Term Goal Advised patient to track and manage COPD triggers Provided instruction about proper use of medications used for management of COPD including inhalers Advised patient to self assesses COPD action plan zone and make appointment with provider if in the yellow zone for 48 hours without improvement Provided education about and advised patient to utilize infection prevention strategies to reduce risk of respiratory infection Assessed social determinant of health barriers   Hyperlipidemia Interventions:  (Status:  Goal on track:  Yes.) Long Term Goal Medication review performed; medication list updated in electronic medical record.  Provider established cholesterol goals reviewed Reviewed importance of limiting foods high in cholesterol Reviewed exercise goals and target of 150 minutes per week  Pain Interventions:  (Status:  Goal on track:  Yes.) Long Term Goal Pain assessment performed Medications reviewed Reviewed provider established plan for pain management Discussed importance of adherence to all scheduled medical appointments Counseled on the importance of reporting any/all new or changed pain symptoms or management strategies to pain management provider Reviewed with patient prescribed pharmacological and nonpharmacological pain relief strategies Reviewed post procedure instructions with pt   Anxiety and Depression  (Status: Goal on Track (progressing): YES.) Long Term Goal  Evaluation of current treatment plan related to Anxiety and Depression, Mental Health Concerns  self-management and patient's adherence to plan as established by provider. 09-20-2021: The patient is optimistic today. Feels like his depression and anxiety is stable. Does get concerned over medication needs and sharing his vehicle with his son. He is thankful for the help from the CCM team.  Discussed plans with patient for ongoing care management follow up and provided patient with direct contact information for  care management team Advised patient to call the office for changes in his mood, anxiety, depression, or mental health. The patient states since his pain is better managed his attitude is better; Provided education to patient re: support system and working with the CCM team to optimize health and well being and be a support to the patient with management of his chronic conditions and mental health needs; Reviewed medications with patient and discussed compliance. The patient admits that he has not always taken his medications correctly but he is open to recommendations and help from the CCM team; Provided patient with number to reach [facility], services available, support system and resources available educational materials related to effective management of mental health and well being- depression and anxiety; Discussed plans with patient for ongoing care management follow up and provided patient with direct contact information for care management team; Advised patient to discuss changes in his mental health and well being  with provider;   Patient Goals/Self-Care Activities: Take medications as prescribed   Attend all scheduled provider appointments Call pharmacy for medication refills 3-7 days in advance  of running out of medications Perform all self care activities independently  Call provider office for new concerns or questions  identify and remove indoor air pollutants limit outdoor activity during cold weather develop a rescue plan eliminate symptom triggers at home follow rescue plan if symptoms flare-up don't eat or exercise right before bedtime eat healthy/prescribed diet: Heart healthy get at least 7 to 8 hours of sleep at night - call for medicine refill 2 or 3 days before it runs out - take all medications exactly as prescribed - call doctor with any symptoms you believe are related to your medicine - adhere to prescribed diet: Heart healthy - develop an exercise  routine   Follow Up Plan:  Telephone follow up appointment with care management team member scheduled for:  11/20/21 at [place] RN, BSN,CCM, [place] Management Coordinator [facility] [phone]         Plan:Telephone follow up appointment with care management team member scheduled for:  10/1/023 The patient has been provided with contact information for the care management team and has been advised to call with any health related questions or concerns.  Peter Garter RN, [place], [facility] [phone]

## 2021-10-18 NOTE — Patient Instructions (Signed)
Visit Information  Thank you for taking time to visit with me today. Please don't hesitate to contact me if I can be of assistance to you before our next scheduled telephone appointment.  Following are the goals we discussed today:  Take medications as prescribed   Attend all scheduled provider appointments Call pharmacy for medication refills 3-7 days in advance of running out of medications Perform all self care activities independently  Call provider office for new concerns or questions  identify and remove indoor air pollutants limit outdoor activity during cold weather develop a rescue plan eliminate symptom triggers at home follow rescue plan if symptoms flare-up don't eat or exercise right before bedtime eat healthy/prescribed diet: Heart healthy get at least 7 to 8 hours of sleep at night - call for medicine refill 2 or 3 days before it runs out - take all medications exactly as prescribed - call doctor with any symptoms you believe are related to your medicine - adhere to prescribed diet: Heart healthy - develop an exercise routine  Our next appointment is by telephone on 11/20/21  at 10 AM  Please call the care guide team at [phone] if you need to cancel or reschedule your appointment.   If you are experiencing a Mental Health or [place] or need someone to talk to, please call the Suicide and Crisis Lifeline: 988 call the [place] National Suicide Prevention Lifeline: (947)482-7113 or TTY: 228 859 1488 TTY 680-028-8464) to talk to a trained counselor call 1-800-273-TALK (toll free, 24 hour hotline) call 911   The patient verbalized understanding of instructions, educational materials, and care plan provided today and DECLINED offer to receive copy of patient instructions, educational materials, and care plan.   Peter Garter RN, [place]uline [place], [facility] [phone]

## 2021-10-18 NOTE — Patient Instructions (Signed)

## 2021-10-19 ENCOUNTER — Telehealth: Payer: Self-pay

## 2021-10-19 ENCOUNTER — Ambulatory Visit: Payer: Medicare Other | Admitting: Pharmacist

## 2021-10-19 DIAGNOSIS — I129 Hypertensive chronic kidney disease with stage 1 through stage 4 chronic kidney disease, or unspecified chronic kidney disease: Secondary | ICD-10-CM

## 2021-10-19 DIAGNOSIS — J432 Centrilobular emphysema: Secondary | ICD-10-CM

## 2021-10-19 DIAGNOSIS — E538 Deficiency of other specified B group vitamins: Secondary | ICD-10-CM

## 2021-10-19 NOTE — Telephone Encounter (Signed)
Requested Prescriptions  Pending Prescriptions Disp Refills  . [place] 2.5 MCG/ACT AERS [Pharmacy Med Name: SPIRIVA RESPIMAT 2.5 MCG/ACT INH AE] 4 g 0    Sig: INHALE 2 PUFFS BY MOUTH INTO THE LUNGS DAILY.     Pulmonology:  Anticholinergic Agents Passed - 10/18/2021  2:40 PM      Passed - Valid encounter within last 12 months    Recent Outpatient Visits          2 months ago Benign hypertension with CKD (chronic kidney disease) stage III [facility])   [facility], Devonne Doughty, DO   8 months ago Chronic right-sided low back pain with bilateral sciatica   [place], DO   10 months ago Acute intractable headache, unspecified headache type   [facility], Coralie Keens, NP   11 months ago Annual physical exam   [place], DO   1 year ago Bilateral lower extremity edema   [place], Devonne Doughty, [place]

## 2021-10-19 NOTE — Telephone Encounter (Signed)
Post procedure phone call.  Voicemail has not been set up.  Unable to leave a message.  

## 2021-10-19 NOTE — Patient Instructions (Signed)
Visit Information  Thank you for taking time to visit with me today. Please don't hesitate to contact me if I can be of assistance to you before our next scheduled telephone appointment.  Following are the goals we discussed today:   Goals Addressed             This Visit's Progress    Pharmacy Goals       Please check your home blood pressure, keep a log of the results and bring this with you to your medical appointments.  Our goal bad cholesterol, or LDL, is less than 70 . This is why it is important to continue taking your simvastatin 20 mg daily.  Thank you!  Wallace Cullens, PharmD, [place], CPP Clinical Pharmacist [facility] [phone]          Our next appointment is by telephone on 11/17 at 12 pm  Please call the care guide team at [phone] if you need to cancel or reschedule your appointment.    The patient verbalized understanding of instructions, educational materials, and care plan provided today and DECLINED offer to receive copy of patient instructions, educational materials, and care plan.

## 2021-10-19 NOTE — Chronic Care Management (AMB) (Signed)
Chronic Care Management CCM Pharmacy Note  10/19/2021 Name:  Charles Ewing MRN:  [id number] DOB:  Aug 10, 1940   Subjective: Charles Ewing is an 81 y.o. year old male who is a primary patient of Olin Hauser, DO.  The CCM team was consulted for assistance with disease management and care coordination needs.    Engaged with patient by telephone for follow up visit for pharmacy case management and/or care coordination services.   Objective:  Medications Reviewed Today     Reviewed by Dimitri Ped, RN (Registered Nurse) on 10/18/21 at 1406  Med List Status: <None>   Medication Order Taking? Sig Documenting Provider Last Dose Status Informant  albuterol (VENTOLIN HFA) 108 (90 Base) MCG/ACT inhaler [id number] No Inhale 2 puffs into the lungs every 6 (six) hours as needed for wheezing or shortness of breath. Olin Hauser, DO Taking Active   amLODipine (NORVASC) 5 MG tablet [id number] No TAKE 1 TABLET BY MOUTH DAILY Olin Hauser, DO Taking Active   aspirin EC 81 MG tablet [id number] No Take 162 mg by mouth every evening. [provider] Taking Active Pharmacy Records           Med Note Curley Spice, [facility] A   Mon Aug 27, 2021  2:19 PM)    Cholecalciferol (VITAMIN D3) 125 MCG (5000 UT) CAPS [id number] No Take 1 capsule (5,000 Units total) by mouth daily with breakfast. Take along with calcium and magnesium. Milinda Pointer, MD Taking Active      Discontinued 10/18/21 1404 (Patient Discharge)   FLUoxetine (PROZAC) 20 MG capsule [id number] No Take 1 capsule (20 mg total) by mouth 2 (two) times daily. Olin Hauser, DO Taking Active Pharmacy Records           Med Note [facility], Marvis Repress Dec 27, 2020  1:52 PM)    fluticasone [facility]) 50 MCG/ACT nasal spray [id number] No PLACE 2 SPRAYS INTO BOTH NOSTRILS DAILY  Patient taking differently: Place 2 sprays into both nostrils daily as needed.   Olin Hauser, DO Taking Active  Pharmacy Records  furosemide (LASIX) 20 MG tablet [id number] No Take 1 tablet (20 mg total) by mouth every other day. Olin Hauser, DO Taking Active   gabapentin (NEURONTIN) 100 MG capsule [id number] No START 1 CAPSULE BY MOUTH DAILY, INCREASEBY 1 CAPSULE EVERY 2 TO 3 DAYS AS TOLERATED UP TO 3 TIMES A DAY, OR MAY TAKE 3 AT ONCE IN THE EVENING [place], Devonne Doughty, DO Taking Active            Med Note Curley Spice, Nayra Coury A   Mon Aug 27, 2021  2:32 PM) Taking 1 capsule three times daily  HYDROcodone-acetaminophen (NORCO/VICODIN) 5-325 MG tablet [id number] No Take 1 tablet by mouth every 8 (eight) hours as needed for up to 7 days for severe pain. Must last 7 days. Milinda Pointer, MD Taking Active   HYDROcodone-acetaminophen (NORCO/VICODIN) 5-325 MG tablet [id number] No Take 1 tablet by mouth every 8 (eight) hours as needed for up to 7 days for severe pain. Must last for 7 days. Milinda Pointer, MD Taking Active            Med Note Dossie Arbour, FRANCISCO A   Thu Oct 18, 2021  8:23 AM) WARNING: Not a Duplicate. Future prescription. DO NOT DELETE during hospital medication reconciliation or at discharge. [facility] Chronic Pain Management Patient   lactated ringers infusion [id number]   Milinda Pointer, MD  Expired 10/18/21 [phone]  LORazepam (ATIVAN) 0.5 MG tablet [id number] No TAKE 1 TABLET BY MOUTH AT BEDTIME AS NEEDED FOR ANXIETY [place], Devonne Doughty, DO Taking Active   losartan (COZAAR) 25 MG tablet [id number] No TAKE 1 TABLET BY MOUTH DAILY Olin Hauser, DO Taking Active   meloxicam (MOBIC) 15 MG tablet [id number] No TAKE 1 TABLET BY MOUTH DAILY AS NEEDED FOR PAIN Olin Hauser, DO Taking Active   oxyCODONE (OXY IR/ROXICODONE) 5 MG immediate release tablet [id number] No Take 1 tablet (5 mg total) by mouth 2 (two) times daily as needed for severe pain. Each refill must last 30 days. Milinda Pointer, MD Taking Expired 10/02/21 2359   oxyCODONE (OXY IR/ROXICODONE) 5 MG  immediate release tablet [id number] No Take 1 tablet (5 mg total) by mouth 2 (two) times daily as needed for severe pain. Each refill must last 30 days. Milinda Pointer, MD Taking Active   oxyCODONE (OXY IR/ROXICODONE) 5 MG immediate release tablet [id number] No Take 1 tablet (5 mg total) by mouth 2 (two) times daily as needed for severe pain. Each refill must last 30 days. Milinda Pointer, MD Taking Active            Med Note Dossie Arbour, [place] A   Mon Aug 27, 2021 10:55 AM) WARNING: Not a Duplicate. Future prescription. DO NOT DELETE during hospital medication reconciliation or at discharge. [facility] Chronic Pain Management Patient   pantoprazole (PROTONIX) 20 MG tablet [id number] No Take 1 tablet by mouth daily. [provider] Taking Active      Discontinued 10/18/21 1404 (Patient Discharge)   potassium chloride (MICRO-K) 10 MEQ CR capsule [id number] No Take 1 capsule (10 mEq total) by mouth every other day. Olin Hauser, DO Taking Active   simvastatin (ZOCOR) 20 MG tablet [id number] No Take 1 tablet (20 mg total) by mouth at bedtime. Olin Hauser, DO Taking Active   tamsulosin (FLOMAX) 0.4 MG CAPS capsule [id number] No TAKE 1 CAPSULE BY MOUTH DAILY AFTER BREAKFAST [place]os, Devonne Doughty, DO Taking Active   Tiotropium Bromide Monohydrate (SPIRIVA RESPIMAT) 2.5 MCG/ACT AERS [id number] No Inhale 2 each into the lungs daily. Olin Hauser, DO Taking Active   Med List Note Dewayne Shorter, RN 08/27/21 1110): Mr 12-01-2021            Pertinent Labs:  Lab Results  Component Value Date   HGBA1C 5.8 (H) 10/30/2020   Lab Results  Component Value Date   CHOL 96 10/30/2020   HDL 40 10/30/2020   LDLCALC 39 10/30/2020   TRIG 87 10/30/2020   CHOLHDL 2.4 10/30/2020   Lab Results  Component Value Date   CREATININE 1.18 06/13/2021   BUN 18 06/13/2021   NA 138 06/13/2021   K 4.6 06/13/2021   CL 102 06/13/2021   CO2 26 12/23/2020   BP Readings from  Last 3 Encounters:  10/18/21 (!) 170/80  09/27/21 135/75  09/13/21 (!) 150/81   Pulse Readings from Last 3 Encounters:  10/18/21 78  09/27/21 95  09/13/21 (!) 58    SDOH:  (Social Determinants of Health) assessments and interventions performed:  SDOH Interventions    Flowsheet Row Chronic Care Management from 10/18/2021 in [place] from 08/06/2021 in [place]  SDOH Interventions    Food Insecurity Interventions -- Intervention Not Indicated  Housing Interventions Intervention Not Indicated Intervention Not Indicated  Transportation Interventions -- Intervention Not Indicated  Utilities Interventions Intervention Not Indicated --  Financial Strain Interventions -- Intervention  Not Indicated  Physical Activity Interventions -- Intervention Not Indicated  Stress Interventions -- Intervention Not Indicated  Social Connections Interventions -- Intervention Not Indicated       CCM Care Plan  Review of patient past medical history, allergies, medications, health status, including review of consultants reports, laboratory and other test data, was performed as part of comprehensive evaluation and provision of chronic care management services.   Care Plan : PharmD - Medication Adherence/Management  Updates made by Rennis Petty, RPH-CPP since 10/19/2021 12:00 AM     Problem: Disease Progression      Long-Range Goal: Disease Progression Prevented or Minimized   Start Date: 08/27/2021  Expected End Date: 11/25/2021  Priority: High  Note:   Current Barriers:  Chronic Disease Management support and education needs related to HTN, HLD, COPD, Chronic Pain and GERD  Pharmacist Clinical Goal(s):  patient will achieve adherence to monitoring guidelines and medication adherence to achieve therapeutic efficacy through collaboration with PharmD and provider.    Interventions: 1:1 collaboration with Olin Hauser, DO  regarding development and update of comprehensive plan of care as evidenced by provider attestation and co-signature Inter-disciplinary care team collaboration (see longitudinal plan of care) Perform chart review Patient seen for Procedure Visit with [facility] yesterday for Lumbar Facet, Medial Branch Radiofrequency Ablation (RFA) Today patient reports having a significant improvement in his pain since procedure yesterday. States that he was advised by provider to wait an see if pain remains controlled over the next few weeks Reports not needing today, but has to take hydrocodone/acetaminophen 5-325 mg up to every 8 hours as needed for pain Reports not currently taking oxycodone while on hydrocodone/acetaminophen  Reports aware that hydrocodone can make him drowsy/dizzy and so that he takes this with caution to avoid risk of falls and only when he is at home Per 7/21 lab result note. Provider advised patient B12 up to 1535, normal range now. He may reduce dose of Vitamin B12 down to 1,033mg one dose daily for a maintenance or just take a Multivitamin that has B12 included Patient reports now taking Vitamin B12 1,0073m daily as directed Reports his son lives with him and provides support Reports appointment with Cardiologist, previously scheduled for 8/2 is now rescheduled for 10/13 Remind patient to contact office to schedule 3 month follow up appointment with PCP  Medication Adherence: Reports uses weekly pillbox to organize his medications Have encouraged patient to obtain and start using a 2nd weekly pillbox (through health plan over the counter benefit) for evening medications Reports [facility]  COPD: Current treatment: Spiriva Respimat 2.5 mcg - 2 puffs daily Albuterol inhaler as needed Reports uses Spiriva maintenance inhaler daily as directed Reported also uses a daily mouth rinse from [facility] daily to prevent dry  mouth   Hypertension: Current treatment: Amlodipine 5 mg daily Losartan 25 mg daily Furosemide 20 mg every other day Potassium chloride 10 meq every other day Reports has home upper arm blood pressure monitor and checks home BP, but does not have log of readings to review today Denies symptoms of hypotension Denies any s/s of swelling  Tobacco use: Currently smokes ~1 pack/day Have counseled on benefits of smoking cessation Denied interest in quitting at this time  Patient Goals/Self-Care Activities patient will:  - take medications as prescribed as evidenced by patient report and record review - focus on medication adherence by using AM and PM weekly pillboxes - check blood pressure,  document, and provide at future appointments - attend medical appointments as scheduled  [facility] visit with [facility] now scheduled for 10/13       Plan: Telephone follow up appointment with care management team member scheduled for:  11/17 at 68 pm  Wallace Cullens, PharmD, [place], [facility] [place] [phone]

## 2021-10-29 ENCOUNTER — Other Ambulatory Visit: Payer: Self-pay | Admitting: Family Medicine

## 2021-10-29 ENCOUNTER — Other Ambulatory Visit: Payer: Self-pay | Admitting: Pain Medicine

## 2021-10-29 DIAGNOSIS — I10 Essential (primary) hypertension: Secondary | ICD-10-CM

## 2021-10-29 DIAGNOSIS — E559 Vitamin D deficiency, unspecified: Secondary | ICD-10-CM

## 2021-10-29 DIAGNOSIS — N138 Other obstructive and reflux uropathy: Secondary | ICD-10-CM

## 2021-10-29 DIAGNOSIS — R3911 Hesitancy of micturition: Secondary | ICD-10-CM

## 2021-10-29 DIAGNOSIS — R809 Proteinuria, unspecified: Secondary | ICD-10-CM

## 2021-10-29 DIAGNOSIS — R350 Frequency of micturition: Secondary | ICD-10-CM

## 2021-10-30 ENCOUNTER — Other Ambulatory Visit: Payer: Self-pay | Admitting: Family Medicine

## 2021-10-30 DIAGNOSIS — R350 Frequency of micturition: Secondary | ICD-10-CM

## 2021-10-30 DIAGNOSIS — N138 Other obstructive and reflux uropathy: Secondary | ICD-10-CM

## 2021-10-30 DIAGNOSIS — R809 Proteinuria, unspecified: Secondary | ICD-10-CM

## 2021-10-30 DIAGNOSIS — I10 Essential (primary) hypertension: Secondary | ICD-10-CM

## 2021-10-30 DIAGNOSIS — R3911 Hesitancy of micturition: Secondary | ICD-10-CM

## 2021-10-30 NOTE — Telephone Encounter (Signed)
Requested Prescriptions  Pending Prescriptions Disp Refills  . losartan (COZAAR) 25 MG tablet [Pharmacy Med Name: LOSARTAN POTASSIUM 25 MG TAB] 90 tablet 0    Sig: TAKE 1 TABLET BY MOUTH DAILY     Cardiovascular:  Angiotensin Receptor Blockers Failed - 10/29/2021 10:23 AM      Failed - Last BP in normal range    BP Readings from Last 1 Encounters:  10/18/21 (!) 170/80         Passed - Cr in normal range and within 180 days    Creat  Date Value Ref Range Status  10/30/2020 0.96 0.70 - 1.22 mg/dL Final   Creatinine, Ser  Date Value Ref Range Status  06/13/2021 1.18 0.76 - 1.27 mg/dL Final         Passed - K in normal range and within 180 days    Potassium  Date Value Ref Range Status  06/13/2021 4.6 3.5 - 5.2 mmol/L Final  10/16/2013 3.8 3.5 - 5.1 mmol/L Final         Passed - Patient is not pregnant      Passed - Valid encounter within last 6 months    Recent Outpatient Visits          2 months ago Benign hypertension with CKD (chronic kidney disease) stage III [facility])   [facility] Olin Hauser, DO   8 months ago Chronic right-sided low back pain with bilateral sciatica   [place], DO   10 months ago Acute intractable headache, unspecified headache type   [facility] [place], Coralie Keens, NP   11 months ago Annual physical exam   [facility] Olin Hauser, DO   1 year ago Bilateral lower extremity edema   [facility] [place], Devonne Doughty, DO             . tamsulosin (FLOMAX) 0.4 MG CAPS capsule [Pharmacy Med Name: TAMSULOSIN HCL 0.4 MG CAP] 90 capsule 0    Sig: TAKE 1 CAPSULE BY MOUTH DAILY AFTER BREAKFAST     Urology: Alpha-Adrenergic Blocker Failed - 10/29/2021 10:23 AM      Failed - PSA in normal range and within 360 days    PSA  Date Value Ref Range Status  10/30/2020 8.29 (H) < OR = 4.00 ng/mL Final    Comment:    The total PSA  value from this assay system is  standardized against the WHO standard. The test  result will be approximately 20% lower when compared  to the equimolar-standardized total PSA (Beckman  Coulter). Comparison of serial PSA results should be  interpreted with this fact in mind. . This test was performed using the Siemens  chemiluminescent method. Values obtained from  different assay methods cannot be used interchangeably. PSA levels, regardless of value, should not be interpreted as absolute evidence of the presence or absence of disease.    Prostate Specific Ag, Serum  Date Value Ref Range Status  12/18/2015 5.6 (H) 0.0 - 4.0 ng/mL Final    Comment:    Roche ECLIA methodology. According to the American Urological Association, Serum PSA should decrease and remain at undetectable levels after radical prostatectomy. The AUA defines biochemical recurrence as an initial PSA value 0.2 ng/mL or greater followed by a subsequent confirmatory PSA value 0.2 ng/mL or greater. Values obtained with different assay methods or kits cannot be used interchangeably. Results cannot be interpreted as absolute evidence of  the presence or absence of malignant disease.          Failed - Last BP in normal range    BP Readings from Last 1 Encounters:  10/18/21 (!) 170/80         Passed - Valid encounter within last 12 months    Recent Outpatient Visits          2 months ago Benign hypertension with CKD (chronic kidney disease) stage III [facility])   [facility] [place], Devonne Doughty, DO   8 months ago Chronic right-sided low back pain with bilateral sciatica   [place], DO   10 months ago Acute intractable headache, unspecified headache type   [facility] [place], Coralie Keens, NP   11 months ago Annual physical exam   [place], DO   1 year ago Bilateral lower extremity edema   [place], Devonne Doughty, [place]

## 2021-10-30 NOTE — Telephone Encounter (Signed)
Both refilled 10/30/2021 #90 0 rf - confirmed by same pharmacy. Requested Prescriptions  Pending Prescriptions Disp Refills  . losartan (COZAAR) 25 MG tablet [Pharmacy Med Name: LOSARTAN POTASSIUM 25 MG TAB] 90 tablet 0    Sig: TAKE 1 TABLET BY MOUTH DAILY     Cardiovascular:  Angiotensin Receptor Blockers Failed - 10/30/2021 10:16 AM      Failed - Last BP in normal range    BP Readings from Last 1 Encounters:  10/18/21 (!) 170/80         Passed - Cr in normal range and within 180 days    Creat  Date Value Ref Range Status  10/30/2020 0.96 0.70 - 1.22 mg/dL Final   Creatinine, Ser  Date Value Ref Range Status  06/13/2021 1.18 0.76 - 1.27 mg/dL Final         Passed - K in normal range and within 180 days    Potassium  Date Value Ref Range Status  06/13/2021 4.6 3.5 - 5.2 mmol/L Final  10/16/2013 3.8 3.5 - 5.1 mmol/L Final         Passed - Patient is not pregnant      Passed - Valid encounter within last 6 months    Recent Outpatient Visits          2 months ago Benign hypertension with CKD (chronic kidney disease) stage III [facility])   [facility] Olin Hauser, DO   8 months ago Chronic right-sided low back pain with bilateral sciatica   [place], DO   10 months ago Acute intractable headache, unspecified headache type   [facility] [place], Coralie Keens, NP   11 months ago Annual physical exam   [facility] Olin Hauser, DO   1 year ago Bilateral lower extremity edema   [facility] [place], Devonne Doughty, DO             . tamsulosin (FLOMAX) 0.4 MG CAPS capsule [Pharmacy Med Name: TAMSULOSIN HCL 0.4 MG CAP] 90 capsule 0    Sig: TAKE 1 CAPSULE BY MOUTH DAILY AFTER BREAKFAST     Urology: Alpha-Adrenergic Blocker Failed - 10/30/2021 10:16 AM      Failed - PSA in normal range and within 360 days    PSA  Date Value Ref Range Status  10/30/2020  8.29 (H) < OR = 4.00 ng/mL Final    Comment:    The total PSA value from this assay system is  standardized against the WHO standard. The test  result will be approximately 20% lower when compared  to the equimolar-standardized total PSA (Beckman  Coulter). Comparison of serial PSA results should be  interpreted with this fact in mind. . This test was performed using the Siemens  chemiluminescent method. Values obtained from  different assay methods cannot be used interchangeably. PSA levels, regardless of value, should not be interpreted as absolute evidence of the presence or absence of disease.    Prostate Specific Ag, Serum  Date Value Ref Range Status  12/18/2015 5.6 (H) 0.0 - 4.0 ng/mL Final    Comment:    Roche ECLIA methodology. According to the American Urological Association, Serum PSA should decrease and remain at undetectable levels after radical prostatectomy. The AUA defines biochemical recurrence as an initial PSA value 0.2 ng/mL or greater followed by a subsequent confirmatory PSA value 0.2 ng/mL or greater. Values obtained with different assay methods or kits cannot  be used interchangeably. Results cannot be interpreted as absolute evidence of the presence or absence of malignant disease.          Failed - Last BP in normal range    BP Readings from Last 1 Encounters:  10/18/21 (!) 170/80         Passed - Valid encounter within last 12 months    Recent Outpatient Visits          2 months ago Benign hypertension with CKD (chronic kidney disease) stage III [facility])   [facility] [place], Devonne Doughty, DO   8 months ago Chronic right-sided low back pain with bilateral sciatica   [place], DO   10 months ago Acute intractable headache, unspecified headache type   [facility] [place], Coralie Keens, NP   11 months ago Annual physical exam   [place], DO   1 year ago Bilateral lower extremity edema   [place], Devonne Doughty, [place]

## 2021-11-09 ENCOUNTER — Other Ambulatory Visit: Payer: Self-pay | Admitting: Family Medicine

## 2021-11-09 DIAGNOSIS — F4310 Post-traumatic stress disorder, unspecified: Secondary | ICD-10-CM

## 2021-11-09 DIAGNOSIS — G8929 Other chronic pain: Secondary | ICD-10-CM

## 2021-11-09 DIAGNOSIS — F419 Anxiety disorder, unspecified: Secondary | ICD-10-CM

## 2021-11-09 NOTE — Telephone Encounter (Signed)
Requested medication (s) are due for refill today:   Yes for both  Requested medication (s) are on the active medication list:   Yes for both  Future visit scheduled:   No   Seen 3 mo. ago   Last ordered: Mobic 08/07/2021 #30, 2 refills;  Prozac 11/06/2020 #1180, 3 refills   Returned because lab due for Mobic.  Liver enzyme due per protocol  Requested Prescriptions  Pending Prescriptions Disp Refills   meloxicam (MOBIC) 15 MG tablet [Pharmacy Med Name: MELOXICAM 15 MG TAB] 30 tablet 2    Sig: TAKE 1 TABLET BY MOUTH DAILY AS NEEDED FOR PAIN     Analgesics:  COX2 Inhibitors Failed - 11/09/2021 12:50 PM      Failed - Manual Review: Labs are only required if the patient has taken medication for more than 8 weeks.      Failed - ALT in normal range and within 360 days    ALT  Date Value Ref Range Status  10/30/2020 11 9 - 46 U/L Final   SGPT (ALT)  Date Value Ref Range Status  10/15/2013 27 U/L Final    Comment:    14-63 NOTE: New Reference Range 08/31/13          Passed - HGB in normal range and within 360 days    Hemoglobin  Date Value Ref Range Status  12/23/2020 13.5 13.0 - 17.0 g/dL Final  05/19/2015 14.6 12.6 - 17.7 g/dL Final         Passed - Cr in normal range and within 360 days    Creat  Date Value Ref Range Status  10/30/2020 0.96 0.70 - 1.22 mg/dL Final   Creatinine, Ser  Date Value Ref Range Status  06/13/2021 1.18 0.76 - 1.27 mg/dL Final         Passed - HCT in normal range and within 360 days    HCT  Date Value Ref Range Status  12/23/2020 40.6 39.0 - 52.0 % Final   Hematocrit  Date Value Ref Range Status  05/19/2015 44.5 37.5 - 51.0 % Final         Passed - AST in normal range and within 360 days    AST  Date Value Ref Range Status  06/13/2021 13 0 - 40 IU/L Final   SGOT(AST)  Date Value Ref Range Status  10/15/2013 15 15 - 37 Unit/L Final         Passed - eGFR is 30 or above and within 360 days    GFR, Est African American  Date Value  Ref Range Status  01/11/2019 64 > OR = 60 mL/min/1.102m Final   GFR, Est Non African American  Date Value Ref Range Status  01/11/2019 55 (L) > OR = 60 mL/min/1.722mFinal   GFR, Estimated  Date Value Ref Range Status  12/23/2020 >60 >60 mL/min Final    Comment:    (NOTE) Calculated using the CKD-EPI Creatinine Equation (2021)    eGFR  Date Value Ref Range Status  06/13/2021 62 >59 mL/min/1.73 Final         Passed - Patient is not pregnant      Passed - Valid encounter within last 12 months    Recent Outpatient Visits           3 months ago [facility]  [place]   8 months ago Chronic right-sided low back pain  with bilateral sciatica   [place], DO   11 months ago Acute intractable headache, unspecified headache type   [facility] [place], Coralie Keens, NP   1 year ago Annual physical exam   [facility] Olin Hauser, DO   1 year ago Bilateral lower extremity edema   [place], DO               FLUoxetine (PROZAC) 20 MG capsule [Pharmacy Med Name: FLUOXETINE HCL 20 MG CAP] 180 capsule 3    Sig: TAKE ONE CAPSULE BY MOUTH TWICE A DAY     Psychiatry:  Antidepressants - SSRI Passed - 11/09/2021 12:50 PM      Passed - Completed PHQ-2 or PHQ-9 in the last 360 days      Passed - Valid encounter within last 6 months    Recent Outpatient Visits           3 months ago Benign hypertension with CKD (chronic kidney disease) stage III [facility])   [place], DO   8 months ago Chronic right-sided low back pain with bilateral sciatica   [place], DO   11 months ago Acute intractable headache, unspecified headache type   [facility] [place], Coralie Keens, NP    1 year ago Annual physical exam   [facility] Olin Hauser, DO   1 year ago Bilateral lower extremity edema   [place], Devonne Doughty, DO

## 2021-11-10 DIAGNOSIS — I119 Hypertensive heart disease without heart failure: Secondary | ICD-10-CM | POA: Diagnosis not present

## 2021-11-10 DIAGNOSIS — F17210 Nicotine dependence, cigarettes, uncomplicated: Secondary | ICD-10-CM | POA: Diagnosis not present

## 2021-11-10 DIAGNOSIS — J449 Chronic obstructive pulmonary disease, unspecified: Secondary | ICD-10-CM | POA: Diagnosis not present

## 2021-11-10 DIAGNOSIS — E785 Hyperlipidemia, unspecified: Secondary | ICD-10-CM

## 2021-11-10 DIAGNOSIS — I2510 Atherosclerotic heart disease of native coronary artery without angina pectoris: Secondary | ICD-10-CM

## 2021-11-19 ENCOUNTER — Other Ambulatory Visit: Payer: Self-pay | Admitting: Family Medicine

## 2021-11-19 ENCOUNTER — Telehealth: Payer: Self-pay | Admitting: Pharmacist

## 2021-11-19 DIAGNOSIS — J432 Centrilobular emphysema: Secondary | ICD-10-CM

## 2021-11-19 DIAGNOSIS — G8929 Other chronic pain: Secondary | ICD-10-CM

## 2021-11-19 DIAGNOSIS — M5136 Other intervertebral disc degeneration, lumbar region: Secondary | ICD-10-CM

## 2021-11-19 NOTE — Telephone Encounter (Signed)
   Outreach Note  11/19/2021 Name: Charles Ewing MRN: [id number] DOB: 24-Dec-1940  Referred by: Olin Hauser, DO Reason for referral : No chief complaint on file.   Outreach to patient today regarding cholesterol medication adherence as requested by patient's health plan.  From review of dispensing history in chart, note patient last had simvastatin Rx filled on 10/05/2021 for 30 day supply.  Was unable to reach patient via telephone today and unable to leave a message as patient's voicemail is not setup.   Follow Up Plan: Will attempt to reach patient by telephone again within the next 7 days.  Wallace Cullens, PharmD, [facility] Care Management [phone]

## 2021-11-20 ENCOUNTER — Other Ambulatory Visit: Payer: Self-pay | Admitting: Family Medicine

## 2021-11-20 ENCOUNTER — Telehealth: Payer: Self-pay | Admitting: *Deleted

## 2021-11-20 ENCOUNTER — Telehealth: Payer: Medicare Other

## 2021-11-20 DIAGNOSIS — G8929 Other chronic pain: Secondary | ICD-10-CM

## 2021-11-20 DIAGNOSIS — M5136 Other intervertebral disc degeneration, lumbar region: Secondary | ICD-10-CM

## 2021-11-20 NOTE — Telephone Encounter (Signed)
Requested Prescriptions  Pending Prescriptions Disp Refills  . gabapentin (NEURONTIN) 100 MG capsule [Pharmacy Med Name: GABAPENTIN 100 MG CAP] 90 capsule 2    Sig: START 1 CAPSULE BY MOUTH DAILY, INCREASEBY 1 CAPSULE EVERY 2 TO 3 DAYS AS TOLERATED UP TO 3 TIMES A DAY, OR MAY TAKE 3 AT ONCE IN THE EVENING     Neurology: Anticonvulsants - gabapentin Passed - 11/19/2021  3:31 PM      Passed - Cr in normal range and within 360 days    Creat  Date Value Ref Range Status  10/30/2020 0.96 0.70 - 1.22 mg/dL Final   Creatinine, Ser  Date Value Ref Range Status  06/13/2021 1.18 0.76 - 1.27 mg/dL Final         Passed - Completed PHQ-2 or PHQ-9 in the last 360 days      Passed - Valid encounter within last 12 months    Recent Outpatient Visits          3 months ago Benign hypertension with CKD (chronic kidney disease) stage III [facility])   [facility] [place], Devonne Doughty, DO   9 months ago Chronic right-sided low back pain with bilateral sciatica   [place], DO   11 months ago Acute intractable headache, unspecified headache type   [facility] [place], Coralie Keens, NP   1 year ago Annual physical exam   [facility] Olin Hauser, DO   1 year ago Bilateral lower extremity edema   [place], Devonne Doughty, DO      Future Appointments            In 6 days [place], Devonne Doughty, [facility], [place]           . [place] 2.5 MCG/ACT AERS [Pharmacy Med Name: SPIRIVA RESPIMAT 2.5 MCG/ACT INH AE] 4 g 0    Sig: INHALE 2 PUFFS BY MOUTH INTO THE LUNGS DAILY.     Pulmonology:  Anticholinergic Agents Passed - 11/19/2021  3:31 PM      Passed - Valid encounter within last 12 months    Recent Outpatient Visits          3 months ago Benign hypertension with CKD (chronic kidney disease) stage III [facility])   [facility],  Devonne Doughty, DO   9 months ago Chronic right-sided low back pain with bilateral sciatica   [place], DO   11 months ago Acute intractable headache, unspecified headache type   [facility] [place], Coralie Keens, NP   1 year ago Annual physical exam   [facility] Olin Hauser, DO   1 year ago Bilateral lower extremity edema   [place], Devonne Doughty, DO      Future Appointments            In 6 days [place], Devonne Doughty, [facility], [facility]

## 2021-11-20 NOTE — Telephone Encounter (Signed)
  Care Management   Follow Up Note   11/20/2021 Name: SHEDRIC FREDERICKS MRN: [id number] DOB: 10-Sep-1940   Referred by: Olin Hauser, DO Reason for referral : Chronic Care Management   An unsuccessful telephone outreach was attempted today. The patient was referred to the case management team for assistance with care management and care coordination.   Follow Up Plan: Telephone follow up appointment with care management team member scheduled for: upon care guide rescheduling.  Jacqlyn Larsen RNC, BSN RN Case Manager [facility] [phone]

## 2021-11-20 NOTE — Telephone Encounter (Signed)
Unable to refill per protocol, last refill by  Provider 11/20/21. Will refuse duplicate request.  Requested Prescriptions  Pending Prescriptions Disp Refills  . gabapentin (NEURONTIN) 100 MG capsule [Pharmacy Med Name: GABAPENTIN 100 MG CAP] 90 capsule 2    Sig: START 1 CAPSULE BY MOUTH DAILY, INCREASEBY 1 CAPSULE EVERY 2 TO 3 DAYS AS TOLERATED UP TO 3 TIMES A DAY, OR MAY TAKE 3 AT ONCE IN THE EVENING     Neurology: Anticonvulsants - gabapentin Passed - 11/20/2021  9:38 AM      Passed - Cr in normal range and within 360 days    Creat  Date Value Ref Range Status  10/30/2020 0.96 0.70 - 1.22 mg/dL Final   Creatinine, Ser  Date Value Ref Range Status  06/13/2021 1.18 0.76 - 1.27 mg/dL Final         Passed - Completed PHQ-2 or PHQ-9 in the last 360 days      Passed - Valid encounter within last 12 months    Recent Outpatient Visits          3 months ago Benign hypertension with CKD (chronic kidney disease) stage III [facility])   [facility], Devonne Doughty, DO   9 months ago Chronic right-sided low back pain with bilateral sciatica   [place], DO   11 months ago Acute intractable headache, unspecified headache type   [facility] [place], Coralie Keens, NP   1 year ago Annual physical exam   [facility] Olin Hauser, DO   1 year ago Bilateral lower extremity edema   [place], Devonne Doughty, DO      Future Appointments            In 6 days [place], Devonne Doughty, [facility], [facility]

## 2021-11-21 ENCOUNTER — Telehealth: Payer: Medicare Other

## 2021-11-21 ENCOUNTER — Telehealth: Payer: Self-pay | Admitting: Pharmacist

## 2021-11-21 NOTE — Telephone Encounter (Signed)
   Outreach Note  11/21/2021 Name: BALTHAZAR DOOLY MRN: [id number] DOB: Dec 22, 1940  Referred by: Olin Hauser, DO Reason for referral : No chief complaint on file.   Outreach to patient today regarding cholesterol medication adherence as requested by patient's health plan.  Was unable to reach patient via telephone again today and unable to leave a message as patient's voicemail is not setup. Outreach attempt #2.  Note previously sent Rx for 90 day supply of simvastatin 20 mg daily to pharmacy on 08/27/2021. From review of chart, note patient received a 30 day supply from 7/17 and then again on 8/25.  Speak with RPh at [facility], who confirms another refill from 7/17 Rx available for patient. States that patient has other medications ready for pick up and she will fill simvastatin Rx for patient and put a note to confirm if he wants to pick it up comes to pick up the other medications   Follow Up Plan: Will attempt to reach patient by telephone again within the next 7 days.  Wallace Cullens, PharmD, [facility] Care Management [phone]

## 2021-11-23 ENCOUNTER — Telehealth: Payer: Medicare Other

## 2021-11-24 NOTE — Progress Notes (Unsigned)
Patient double booked for 11/28/2021 and 11/29/2021.  11/29/2021 appointment canceled.

## 2021-11-24 NOTE — Progress Notes (Unsigned)
PROVIDER NOTE: Information contained herein reflects review and annotations entered in association with encounter. Interpretation of such information and data should be left to medically-trained personnel. Information provided to patient can be located elsewhere in the medical record under "Patient Instructions". Document created using STT-dictation technology, any transcriptional errors that may result from process are unintentional.    Patient: Charles Ewing  Service Category: E/M  Provider: Gaspar Cola, MD  DOB: [birth year]  DOS: 11/28/2021  Referring Provider: Nobie Ewing *  MRN: [id number]  Specialty: Interventional Pain Management  PCP: Charles Hauser, DO  Type: Established Patient  Setting: Ambulatory outpatient    Location: Office  Delivery: Face-to-face     HPI  [name], a 81 y.o. year old male, is here today because of his No primary diagnosis found.. Charles Ewing primary complain today is No chief complaint on file. Last encounter: My last encounter with him was on 10/29/2021. Pertinent problems: Charles Ewing has Osteoarthritis of knees (Bilateral); Bilateral lower extremity edema; Chronic pain syndrome; Chronic hip pain (2ry area of Pain) (Right); Chronic lower extremity pain (3ry area of Pain) (Right); Chronic low back pain (1ry area of Pain) (Bilateral) (R>L) w/o sciatica; Lumbar facet syndrome; Dextroscoliosis of lumbar spine; DDD (degenerative disc disease), lumbosacral; Osteopenia of lumbar spine; Osteopenia determined by x-ray; Osteoarthritis of sacroiliac joints (HCC) (Bilateral); Osteoarthritis of hips (Bilateral); Chronic sacroiliac joint pain (Bilateral); Sacroiliac joint dysfunction (Bilateral); Spondylosis without myelopathy or radiculopathy, lumbosacral region; and Other spondylosis, sacral and sacrococcygeal region on their pertinent problem list. Pain Assessment: Severity of   is reported as a  /10. Location:    / . Onset:  . Quality:  .  Timing:  . Modifying factor(s):  Marland Kitchen Vitals:  vitals were not taken for this visit.   Reason for encounter:  *** . ***  Pharmacotherapy Assessment  Analgesic: Oxycodone IR 5 mg tablet, 1 tab p.o. 3 times daily (# 60) (last filled on 07/04/2021) MME/day: 22.5 mg/day   Monitoring: [place] PMP: PDMP reviewed during this encounter.       Pharmacotherapy: No side-effects or adverse reactions reported. Compliance: No problems identified. Effectiveness: Clinically acceptable.  No notes on file  No results found for: "CBDTHCR" No results found for: "D8THCCBX" No results found for: "D9THCCBX"  UDS:  Summary  Date Value Ref Range Status  06/14/2021 Note  Final    Comment:    ==================================================================== Compliance Drug Analysis, Ur ==================================================================== Test                             Result       Flag       Units  Drug Present and Declared for Prescription Verification   Lorazepam                      200          EXPECTED   ng/mg creat    Source of lorazepam is a scheduled prescription medication.    Oxycodone                      1671         EXPECTED   ng/mg creat   Noroxycodone                   2522         EXPECTED   ng/mg creat    Sources of  oxycodone include scheduled prescription medications.    Noroxycodone is an expected metabolite of oxycodone.    Gabapentin                     PRESENT      EXPECTED   Fluoxetine                     PRESENT      EXPECTED   Norfluoxetine                  PRESENT      EXPECTED    Norfluoxetine is an expected metabolite of fluoxetine.  Drug Absent but Declared for Prescription Verification   Salicylate                     Not Detected UNEXPECTED    Aspirin, as indicated in the declared medication list, is not always    detected even when used as directed.    Metoprolol                     Not Detected  UNEXPECTED ==================================================================== Test                      Result    Flag   Units      Ref Range   Creatinine              41               mg/dL      >=20 ==================================================================== Declared Medications:  The flagging and interpretation on this report are based on the  following declared medications.  Unexpected results may arise from  inaccuracies in the declared medications.   **Note: The testing scope of this panel includes these medications:   Fluoxetine (Prozac)  Gabapentin (Neurontin)  Lorazepam (Ativan)  Metoprolol (Toprol)  Oxycodone (Roxicodone)   **Note: The testing scope of this panel does not include small to  moderate amounts of these reported medications:   Aspirin   **Note: The testing scope of this panel does not include the  following reported medications:   Albuterol (Ventolin HFA)  Amlodipine (Norvasc)  Fluticasone (Flonase)  Furosemide (Lasix)  Losartan (Cozaar)  Meloxicam (Mobic)  Mupirocin (Bactroban)  Nystatin  Pantoprazole (Protonix)  Potassium Chloride  Simvastatin (Zocor)  Sucralfate (Carafate)  Tamsulosin (Flomax) ==================================================================== For clinical consultation, please call [phone]. ====================================================================       ROS  Constitutional: Denies any fever or chills Gastrointestinal: No reported hemesis, hematochezia, vomiting, or acute GI distress Musculoskeletal: Denies any acute onset joint swelling, redness, loss of ROM, or weakness Neurological: No reported episodes of acute onset apraxia, aphasia, dysarthria, agnosia, amnesia, paralysis, loss of coordination, or loss of consciousness  Medication Review  FLUoxetine, LORazepam, Tiotropium Bromide Monohydrate, albuterol, amLODipine, aspirin EC, cyanocobalamin, fluticasone, furosemide, gabapentin, losartan,  meloxicam, oxyCODONE, pantoprazole, potassium chloride, simvastatin, and tamsulosin  History Review  Allergy: Charles Ewing is allergic to nexium [esomeprazole magnesium]. Drug: Charles Ewing  reports no history of drug use. Alcohol:  reports no history of alcohol use. Tobacco:  reports that he has been smoking cigarettes. He has a 70.00 pack-year smoking history. He has never used smokeless tobacco. Social: Charles Ewing  reports that he has been smoking cigarettes. He has a 70.00 pack-year smoking history. He has never used smokeless tobacco. He reports that he does not drink alcohol and does not use drugs. Medical:  has  a past medical history of Abdominal aortic aneurysm (AAA) ([place]) (2013), Anxiety, Arrhythmia, Arthritis, Colon polyp, COPD (chronic obstructive pulmonary disease) ([place]), Coronary artery disease, Depression, GERD (gastroesophageal reflux disease), Hyperlipidemia, Hypertension, Skin cancer, and Thrush. Surgical: Charles Ewing  has a past surgical history that includes Skin cancer excision; Colon polyp surgery; Transurethral resection of prostate (2010); Cataract extraction w/PHACO (Left, 05/09/2020); and Cataract extraction w/PHACO (Right, 05/23/2020). Family: family history includes Kidney cancer in his mother.  Laboratory Chemistry Profile   Renal Lab Results  Component Value Date   BUN 18 06/13/2021   CREATININE 1.18 06/13/2021   BCR 15 06/13/2021   GFRAA 64 01/11/2019   GFRNONAA >60 12/23/2020    Hepatic Lab Results  Component Value Date   AST 13 06/13/2021   ALT 11 10/30/2020   ALBUMIN 3.9 06/13/2021   ALKPHOS 70 06/13/2021    Electrolytes Lab Results  Component Value Date   NA 138 06/13/2021   K 4.6 06/13/2021   CL 102 06/13/2021   CALCIUM 8.8 06/13/2021   MG 2.3 06/13/2021    Bone Lab Results  Component Value Date   25OHVITD1 14 (L) 06/13/2021   25OHVITD2 1.8 06/13/2021   25OHVITD3 12 06/13/2021    Inflammation (CRP: Acute Phase) (ESR: Chronic Phase) Lab Results   Component Value Date   CRP 1 06/13/2021   ESRSEDRATE 20 06/13/2021         Note: Above Lab results reviewed.  Recent Imaging Review  DG PAIN CLINIC C-ARM 1-60 MIN NO REPORT Fluoro was used, but no Radiologist interpretation will be provided.  Please refer to "NOTES" tab for provider progress note. Note: Reviewed        Physical Exam  General appearance: Well nourished, well developed, and well hydrated. In no apparent acute distress Mental status: Alert, oriented x 3 (person, place, & time)       Respiratory: No evidence of acute respiratory distress Eyes: PERLA Vitals: There were no vitals taken for this visit. BMI: Estimated body mass index is 26.5 kg/m as calculated from the following:   Height as of 10/18/21: '5\' 11"'  (1.803 m).   Weight as of 10/18/21: 190 lb (86.2 kg). Ideal: Patient weight not recorded  Assessment   Diagnosis Status  No diagnosis found. Controlled Controlled Controlled   Updated Problems: No problems updated.   Plan of Care  Problem-specific:  No problem-specific Assessment & Plan notes found for this encounter.  Charles Ewing has a current medication list which includes the following long-term medication(s): albuterol, amlodipine, fluoxetine, fluticasone, furosemide, gabapentin, losartan, oxycodone, oxycodone, oxycodone, potassium chloride, simvastatin, and spiriva respimat.  Pharmacotherapy (Medications Ordered): No orders of the defined types were placed in this encounter.  Orders:  No orders of the defined types were placed in this encounter.  Follow-up plan:   No follow-ups on file.     Interventional Therapies  Risk  Complexity Considerations:   Estimated body mass index is 28.29 kg/m as calculated from the following:   Height as of this encounter: 5' 10.5" (1.791 m).   Weight as of this encounter: 200 lb (90.7 kg). History of: abdominal aortic aneurysm (AAA); hypertension; cardiomyopathy; coronary artery disease; dissection of  abdominal aorta; orthostatic hypotension; irregular heart rhythm; chronic kidney disease; shortness of breath; tobacco abuse; COPD; GERD   Planned  Pending:      Under consideration:      Completed:   Therapeutic right lumbar facet RFA x1 (10/18/2021)  Diagnostic right lumbar facet MBB x2 (09/13/2021) (100/100/50/50)  Completed by other providers:   Diagnostic/therapeutic bilateral IA steroid knee inj. (03/15/2016) by Carlynn Spry, PA Rosanne Gutting)    Therapeutic  Palliative (PRN) options:   Therapeutic right lumbar facet MBB      Recent Visits Date Type Provider Dept  10/18/21 Procedure visit Milinda Pointer, MD [facility]  09/27/21 Office Visit Milinda Pointer, MD [facility]  09/13/21 Procedure visit Milinda Pointer, MD [facility]  08/27/21 Office Visit Milinda Pointer, MD [facility]  Showing recent visits within past 90 days and meeting all other requirements Future Appointments Date Type Provider Dept  11/28/21 Appointment Milinda Pointer, MD [facility]  11/29/21 Appointment Milinda Pointer, MD [facility]  Showing future appointments within next 90 days and meeting all other requirements  I discussed the assessment and treatment plan with the patient. The patient was provided an opportunity to ask questions and all were answered. The patient agreed with the plan and demonstrated an understanding of the instructions.  Patient advised to call back or seek an in-person evaluation if the symptoms or condition worsens.  Duration of encounter: *** minutes.  Total time on encounter, as per AMA guidelines included both the face-to-face and non-face-to-face time personally spent by the physician and/or other qualified health care professional(s) on the day of the encounter (includes time in activities that require the physician or other qualified health care professional and does not include time in  activities normally performed by clinical staff). Physician's time may include the following activities when performed: preparing to see the patient (eg, review of tests, pre-charting review of records) obtaining and/or reviewing separately obtained history performing a medically appropriate examination and/or evaluation counseling and educating the patient/family/caregiver ordering medications, tests, or procedures referring and communicating with other health care professionals (when not separately reported) documenting clinical information in the electronic or other health record independently interpreting results (not separately reported) and communicating results to the patient/ family/caregiver care coordination (not separately reported)  Note by: Charles Cola, MD Date: 11/28/2021; Time: 9:59 AM

## 2021-11-26 ENCOUNTER — Encounter: Payer: Self-pay | Admitting: Family Medicine

## 2021-11-26 ENCOUNTER — Ambulatory Visit (INDEPENDENT_AMBULATORY_CARE_PROVIDER_SITE_OTHER): Payer: Medicare Other | Admitting: Family Medicine

## 2021-11-26 VITALS — BP 110/64 | HR 74 | Ht 71.0 in | Wt 199.0 lb

## 2021-11-26 DIAGNOSIS — B353 Tinea pedis: Secondary | ICD-10-CM

## 2021-11-26 DIAGNOSIS — L03115 Cellulitis of right lower limb: Secondary | ICD-10-CM

## 2021-11-26 DIAGNOSIS — H6122 Impacted cerumen, left ear: Secondary | ICD-10-CM | POA: Diagnosis not present

## 2021-11-26 DIAGNOSIS — Z23 Encounter for immunization: Secondary | ICD-10-CM | POA: Diagnosis not present

## 2021-11-26 DIAGNOSIS — H60339 Swimmer's ear, unspecified ear: Secondary | ICD-10-CM | POA: Diagnosis not present

## 2021-11-26 MED ORDER — TERBINAFINE HCL 250 MG PO TABS: 250.0000 mg | ORAL_TABLET | Freq: Every day | ORAL | 0 refills | Status: DC

## 2021-11-26 MED ORDER — CLOTRIMAZOLE-BETAMETHASONE 1-0.05 % EX CREA: TOPICAL_CREAM | CUTANEOUS | 2 refills | Status: DC

## 2021-11-26 MED ORDER — AMOXICILLIN-POT CLAVULANATE 875-125 MG PO TABS: 1.0000 | ORAL_TABLET | Freq: Two times a day (BID) | ORAL | 0 refills | Status: DC

## 2021-11-26 NOTE — Progress Notes (Signed)
Subjective:    Patient ID: Charles Ewing, male    DOB: 25-Feb-1940, 81 y.o.   MRN: [id number]  Charles Ewing is a 81 y.o. male presenting on 11/26/2021 for Rash   HPI  Right Foot Rash / Dermatitis 1 yr ago, fungal and then some recent spreading, red flaky rash Has seen Dermatology before  Chronic Low Back Pain, with bilateral sciatica Describes chronic low back pain, with radiating pain into both legs with sciatica, seems to be fairly persistent. Now followed by [facility] Pain [facility] - He admits difficulty with ambulation and has less functional, limited for ADL function. He has son that helps him now. Followed by Pain Management on medication therapy including opiate w Oxycodone. Improved with injection therapy for spine Previously w Chiropractor  He admits issue with pain medication, cause dizziness side effect  Health Maintenance: Flu Shot today     08/27/2021   10:52 AM 08/06/2021    3:38 PM 06/13/2021   11:15 AM  Depression screen PHQ 2/9  Decreased Interest 0 0 1  Down, Depressed, Hopeless 0 0 1  PHQ - 2 Score 0 0 2  Altered sleeping   1  Tired, decreased energy   1  Change in appetite   0  Feeling bad or failure about yourself    0  Trouble concentrating   0  Moving slowly or fidgety/restless   0  Suicidal thoughts   0  PHQ-9 Score   4    Social History   Tobacco Use   Smoking status: Every Day    Packs/day: 1.00    Years: 70.00    Total pack years: 70.00    Types: Cigarettes   Smokeless tobacco: Never  Vaping Use   Vaping Use: Never used  Substance Use Topics   Alcohol use: No   Drug use: No    Review of Systems Per HPI unless specifically indicated above     Objective:    BP 110/64   Pulse 74   Ht '5\' 11"'$  (1.803 m)   Wt 199 lb (90.3 kg)   SpO2 99%   BMI 27.75 kg/m   Wt Readings from Last 3 Encounters:  11/26/21 199 lb (90.3 kg)  10/18/21 190 lb (86.2 kg)  09/27/21 195 lb (88.5 kg)    Physical Exam Vitals and nursing note reviewed.   Constitutional:      General: He is not in acute distress.    Appearance: Normal appearance. He is well-developed. He is not diaphoretic.     Comments: Well-appearing, comfortable, cooperative  HENT:     Head: Normocephalic and atraumatic.  Eyes:     General:        Right eye: No discharge.        Left eye: No discharge.     Conjunctiva/sclera: Conjunctivae normal.  Cardiovascular:     Rate and Rhythm: Normal rate.  Pulmonary:     Effort: Pulmonary effort is normal.  Musculoskeletal:     Right lower leg: No edema.     Left lower leg: No edema.  Skin:    General: Skin is warm and dry.     Findings: No erythema or rash.  Neurological:     Mental Status: He is alert and oriented to person, place, and time.  Psychiatric:        Mood and Affect: Mood normal.        Behavior: Behavior normal.        Thought Content: Thought  content normal.     Comments: Well groomed, good eye contact, normal speech and thoughts       Results for orders placed or performed in visit on 08/31/21  B12  Result Value Ref Range   Vitamin B-12 1,535 (H) 200 - 1,100 pg/mL      Assessment & Plan:   Problem List Items Addressed This Visit   None Visit Diagnoses     Tinea pedis of right foot    -  Primary   Relevant Medications   terbinafine (LAMISIL) 250 MG tablet   clotrimazole-betamethasone (LOTRISONE) cream   Needs flu shot       Relevant Orders   Flu Vaccine QUAD High Dose(Fluad) (Completed)   Cellulitis of right foot       Relevant Medications   amoxicillin-clavulanate (AUGMENTIN) 875-125 MG tablet       R Foot with area appears to be some chronic skin dermatitis vs fungal rash with tinea pedis Now more distinct redness in this area extending to forefoot, given the callus and dry cracking skin and flaking, cannot rule out secondary bacterial superinfection.  Will cover with both anti fungal course Terbinafine '250mg'$  daily x 2 weeks and Augmentin for possible skin infection BID x 10  days  Add topical Lotrisone for dermatitis of foot  Flu Shot today  Meds ordered this encounter  Medications   terbinafine (LAMISIL) 250 MG tablet    Sig: Take 1 tablet (250 mg total) by mouth daily.    Dispense:  14 tablet    Refill:  0   amoxicillin-clavulanate (AUGMENTIN) 875-125 MG tablet    Sig: Take 1 tablet by mouth 2 (two) times daily.    Dispense:  20 tablet    Refill:  0   clotrimazole-betamethasone (LOTRISONE) cream    Sig: Apply 1-2 times a day for worsening flare dry skin dermatitis of toes/feet, may re-use daily up to 1 week as needed.    Dispense:  30 g    Refill:  2    Follow up plan: Return in about 4 months (around 03/29/2022) for 4 month follow-up.   [name], [facility] 11/26/2021, 11:29 AM

## 2021-11-26 NOTE — Patient Instructions (Addendum)
Thank you for coming to the office today.  Start anti fungal pill for 2 weeks Start anti bacterial treatment as well for foot for 10 days  Use the topical Lotrisone cream as needed for 1-2 weeks at a time, then can pause, use for flare up if red skin flaky if not improving  Please schedule a Follow-up Appointment to: Return in about 4 months (around 03/29/2022) for 4 month follow-up.  If you have any other questions or concerns, please feel free to call the office or send a message through [facility]. You may also schedule an earlier appointment if necessary.  Additionally, you may be receiving a survey about your experience at our office within a few days to 1 week by e-mail or mail. We value your feedback.  [name], DO [place]

## 2021-11-27 NOTE — Patient Instructions (Signed)
Naloxone Nasal Spray What is this medication? NALOXONE (nal OX one) treats opioid overdose, which causes slow or shallow breathing, severe drowsiness, or trouble staying awake. Call emergency services after using this medication. You may need additional treatment. Naloxone works by reversing the effects of opioids. It belongs to a group of medications called opioid blockers. This medicine may be used for other purposes; ask your health care provider or pharmacist if you have questions. COMMON BRAND NAME(S): Kloxxado, Narcan What should I tell my care team before I take this medication? They need to know if you have any of these conditions: Heart disease Substance use disorder An unusual or allergic reaction to naloxone, other medications, foods, dyes, or preservatives Pregnant or trying to get pregnant Breast-feeding How should I use this medication? This medication is for use in the nose. Lay the person on their back. Support their neck with your hand and allow the head to tilt back before giving the medication. The nasal spray should be given into 1 nostril. After giving the medication, move the person onto their side. Do not remove or test the nasal spray until ready to use. Get emergency medical help right away after giving the first dose of this medication, even if the person wakes up. You should be familiar with how to recognize the signs and symptoms of a narcotic overdose. If more doses are needed, give the additional dose in the other nostril. Talk to your care team about the use of this medication in children. While this medication may be prescribed for children as young as newborns for selected conditions, precautions do apply. Overdosage: If you think you have taken too much of this medicine contact a poison control center or emergency room at once. NOTE: This medicine is only for you. Do not share this medicine with others. What if I miss a dose? This does not apply. What may  interact with this medication? This is only used during an emergency. No interactions are expected during emergency use. This list may not describe all possible interactions. Give your health care provider a list of all the medicines, herbs, non-prescription drugs, or dietary supplements you use. Also tell them if you smoke, drink alcohol, or use illegal drugs. Some items may interact with your medicine. What should I watch for while using this medication? Keep this medication ready for use in the case of an opioid overdose. Make sure that you have the phone number of your care team and local hospital ready. You may need to have additional doses of this medication. Each nasal spray contains a single dose. Some emergencies may require additional doses. After use, bring the treated person to the nearest hospital or call 911. Make sure the treating care team knows that the person has received a dose of this medication. You will receive additional instructions on what to do during and after use of this medication before an emergency occurs. What side effects may I notice from receiving this medication? Side effects that you should report to your care team as soon as possible: Allergic reactions--skin rash, itching, hives, swelling of the face, lips, tongue, or throat Side effects that usually do not require medical attention (report these to your care team if they continue or are bothersome): Constipation Dryness or irritation inside the nose Headache Increase in blood pressure Muscle spasms Stuffy nose Toothache This list may not describe all possible side effects. Call your doctor for medical advice about side effects. You may report side effects to FDA  at 1-800-FDA-1088. Where should I keep my medication? Keep out of the reach of children and pets. Store between 20 and 25 degrees C (68 and 77 degrees F). Do not freeze. Throw away any unused medication after the expiration date. Keep in original  box until ready to use. NOTE: This sheet is a summary. It may not cover all possible information. If you have questions about this medicine, talk to your doctor, pharmacist, or health care provider.  2023 Elsevier/Gold Standard (2020-12-25 00:00:00) ____________________________________________________________________________________________  Medication Rules  Purpose: To inform patients, and their family members, of our rules and regulations.  Applies to: All patients receiving prescriptions (written or electronic).  Pharmacy of record: Pharmacy where electronic prescriptions will be sent. If written prescriptions are taken to a different pharmacy, please inform the nursing staff. The pharmacy listed in the electronic medical record should be the one where you would like electronic prescriptions to be sent.  Electronic prescriptions: In compliance with the [place] (STOP) Act of 2017 (Session Lanny Cramp [phone]), effective February 11, 2018, all controlled substances must be electronically prescribed. Calling prescriptions to the pharmacy will cease to exist.  Prescription refills: Only during scheduled appointments. Applies to all prescriptions.  NOTE: The following applies primarily to controlled substances (Opioid* Pain Medications).   Type of encounter (visit): For patients receiving controlled substances, face-to-face visits are required. (Not an option or up to the patient.)  Patient's responsibilities: Pain Pills: Bring all pain pills to every appointment (except for procedure appointments). Pill Bottles: Bring pills in original pharmacy bottle. Always bring the newest bottle. Bring bottle, even if empty. Medication refills: You are responsible for knowing and keeping track of what medications you take and those you need refilled. The day before your appointment: write a list of all prescriptions that need to be refilled. The day of the  appointment: give the list to the admitting nurse. Prescriptions will be written only during appointments. No prescriptions will be written on procedure days. If you forget a medication: it will not be "Called in", "Faxed", or "electronically sent". You will need to get another appointment to get these prescribed. No early refills. Do not call asking to have your prescription filled early. Prescription Accuracy: You are responsible for carefully inspecting your prescriptions before leaving our office. Have the discharge nurse carefully go over each prescription with you, before taking them home. Make sure that your name is accurately spelled, that your address is correct. Check the name and dose of your medication to make sure it is accurate. Check the number of pills, and the written instructions to make sure they are clear and accurate. Make sure that you are given enough medication to last until your next medication refill appointment. Taking Medication: Take medication as prescribed. When it comes to controlled substances, taking less pills or less frequently than prescribed is permitted and encouraged. Never take more pills than instructed. Never take medication more frequently than prescribed.  Inform other Doctors: Always inform, all of your healthcare providers, of all the medications you take. Pain Medication from other Providers: You are not allowed to accept any additional pain medication from any other Doctor or Healthcare provider. There are two exceptions to this rule. (see below) In the event that you require additional pain medication, you are responsible for notifying us, as stated below. Cough Medicine: Often these contain an opioid, such as codeine or hydrocodone. Never accept or take cough medicine containing these opioids if you are already taking  an opioid* medication. The combination may cause respiratory failure and death. Medication Agreement: You are responsible for carefully  reading and following our Medication Agreement. This must be signed before receiving any prescriptions from our practice. Safely store a copy of your signed Agreement. Violations to the Agreement will result in no further prescriptions. (Additional copies of our Medication Agreement are available upon request.) Laws, Rules, & Regulations: All patients are expected to follow all Federal and [employer], [employer], Rules, [employer] [facility]. Ignorance of the Laws does not constitute a valid excuse.  Illegal drugs and Controlled Substances: The use of illegal substances (including, but not limited to marijuana and its derivatives) and/or the illegal use of any controlled substances is strictly prohibited. Violation of this rule may result in the immediate and permanent discontinuation of any and all prescriptions being written by our practice. The use of any illegal substances is prohibited. Adopted CDC guidelines & recommendations: Target dosing levels will be at or below 60 MME/day. Use of benzodiazepines** is not recommended.  Exceptions: There are only two exceptions to the rule of not receiving pain medications from other Healthcare Providers. Exception #1 (Emergencies): In the event of an emergency (i.e.: accident requiring emergency care), you are allowed to receive additional pain medication. However, you are responsible for: As soon as you are able, call our office [phone], at any time of the day or night, and leave a message stating your name, the date and nature of the emergency, and the name and dose of the medication prescribed. In the event that your call is answered by a member of our staff, make sure to document and save the date, time, and the name of the person that took your information.  Exception #2 (Planned Surgery): In the event that you are scheduled by another doctor or dentist to have any type of surgery or procedure, you are allowed (for a period no longer than 30 days), to receive  additional pain medication, for the acute post-op pain. However, in this case, you are responsible for picking up a copy of our "Post-op Pain Management for Surgeons" handout, and giving it to your surgeon or dentist. This document is available at our office, and does not require an appointment to obtain it. Simply go to our office during business hours (Monday-Thursday from 8:00 AM to 4:00 PM) (Friday 8:00 AM to 12:00 Noon) or if you have a scheduled appointment with [place], prior to your surgery, and ask for it by name. In addition, you are responsible for: calling our office ([phone], at any time of the day or night, and leaving a message stating your name, name of your surgeon, type of surgery, and date of procedure or surgery. Failure to comply with your responsibilities may result in termination of therapy involving the controlled substances. Medication Agreement Violation. Following the above rules, including your responsibilities will help you in avoiding a Medication Agreement Violation ("Breaking your Pain Medication Contract").  *Opioid medications include: morphine, codeine, oxycodone, oxymorphone, hydrocodone, hydromorphone, meperidine, tramadol, tapentadol, buprenorphine, fentanyl, methadone. **Benzodiazepine medications include: diazepam (Valium), alprazolam (Xanax), clonazepam (Klonopine), lorazepam (Ativan), clorazepate (Tranxene), chlordiazepoxide (Librium), estazolam (Prosom), oxazepam (Serax), temazepam (Restoril), triazolam (Halcion) (Last updated: 11/08/2020) ____________________________________________________________________________________________  ____________________________________________________________________________________________  Medication Recommendations and Reminders  Applies to: All patients receiving prescriptions (written and/or electronic).  Medication Rules & Regulations: These rules and regulations exist for your safety and that of others. They are not  flexible and neither are we. Dismissing or ignoring them will be considered "non-compliance"  with medication therapy, resulting in complete and irreversible termination of such therapy. (See document titled "Medication Rules" for more details.) In all conscience, because of safety reasons, we cannot continue providing a therapy where the patient does not follow instructions.  Pharmacy of record:  Definition: This is the pharmacy where your electronic prescriptions will be sent.  We do not endorse any particular pharmacy, however, we have experienced problems with [facility] not securing enough medication supply for the community. We do not restrict you in your choice of pharmacy. However, once we write for your prescriptions, we will NOT be re-sending more prescriptions to fix restricted supply problems created by your pharmacy, or your insurance.  The pharmacy listed in the electronic medical record should be the one where you want electronic prescriptions to be sent. If you choose to change pharmacy, simply notify our nursing staff.  Recommendations: Keep all of your pain medications in a safe place, under lock and key, even if you live alone. We will NOT replace lost, stolen, or damaged medication. After you fill your prescription, take 1 week's worth of pills and put them away in a safe place. You should keep a separate, properly labeled bottle for this purpose. The remainder should be kept in the original bottle. Use this as your primary supply, until it runs out. Once it's gone, then you know that you have 1 week's worth of medicine, and it is time to come in for a prescription refill. If you do this correctly, it is unlikely that you will ever run out of medicine. To make sure that the above recommendation works, it is very important that you make sure your medication refill appointments are scheduled at least 1 week before you run out of medicine. To do this in an effective manner, make sure that  you do not leave the office without scheduling your next medication management appointment. Always ask the nursing staff to show you in your prescription , when your medication will be running out. Then arrange for the receptionist to get you a return appointment, at least 7 days before you run out of medicine. Do not wait until you have 1 or 2 pills left, to come in. This is very poor planning and does not take into consideration that we may need to cancel appointments due to bad weather, sickness, or emergencies affecting our staff. DO NOT ACCEPT A "Partial Fill": If for any reason your pharmacy does not have enough pills/tablets to completely fill or refill your prescription, do not allow for a "partial fill". The law allows the pharmacy to complete that prescription within 72 hours, without requiring a new prescription. If they do not fill the rest of your prescription within those 72 hours, you will need a separate prescription to fill the remaining amount, which we will NOT provide. If the reason for the partial fill is your insurance, you will need to talk to the pharmacist about payment alternatives for the remaining tablets, but again, DO NOT ACCEPT A PARTIAL FILL, unless you can trust your pharmacist to obtain the remainder of the pills within 72 hours.  Prescription refills and/or changes in medication(s):  Prescription refills, and/or changes in dose or medication, will be conducted only during scheduled medication management appointments. (Applies to both, written and electronic prescriptions.) No refills on procedure days. No medication will be changed or started on procedure days. No changes, adjustments, and/or refills will be conducted on a procedure day. Doing so will interfere with the diagnostic portion  of the procedure. No phone refills. No medications will be "called into the pharmacy". No Fax refills. No weekend refills. No Holliday refills. No after hours refills.  Remember:   Business hours are:  Monday to Thursday 8:00 AM to 4:00 PM Provider's Schedule: Milinda Pointer, MD - Appointments are:  Medication management: Monday and Wednesday 8:00 AM to 4:00 PM Procedure day: Tuesday and Thursday 7:30 AM to 4:00 PM Gillis Santa, MD - Appointments are:  Medication management: Tuesday and Thursday 8:00 AM to 4:00 PM Procedure day: Monday and Wednesday 7:30 AM to 4:00 PM (Last update: 09/01/2019) ____________________________________________________________________________________________  ____________________________________________________________________________________________  Pharmacy Shortages of Pain Medication   Introduction Shockingly as it may seem, .  "No U.[place] Supreme Court decision has ever interpreted the Constitution as guaranteeing a right to health care for all Americans." - https://huff.com/  "With respect to human rights, the [place] has no formally codified right to health, nor does it participate in a human rights treaty that specifies a right to health." - Scott J. Schweikart, JD, MBE  Situation By now, most of our patients have had the experience of being told by their pharmacist that they do not have enough medication to cover their prescription. If you have not had this experience, just know that you soon will.  Problem There appears to be a shortage of these medications, either at the national level or locally. This is happening with all pharmacies. When there is not enough medication, patients are offered a partial fill and they are told that they will try to get the rest of the medicine for them at a later time. If they do not have enough for even a partial fill, the pharmacists are telling the patients to call us (the prescribing physicians) to request that we send another prescription to another pharmacy to get the medicine.   This reordering of a controlled  substance creates documentation problems where additional paperwork needs to be created to explain why two prescriptions for the same period of time and the same medicine are being prescribed to the same patient. It also creates situations where the last appointment note does not accurately reflect when and what prescriptions were given to a patient. This leads to prescribing errors down the line, in subsequent follow-up visits.   [employer] of Pharmacy ([employer]) Research revealed that Surveyor, quantity .1806 (21 NCAC 46.1806) authorizes pharmacists to the transfer of prescriptions among pharmacies, and it sets forth procedural and recordkeeping requirements for doing so. However, this requires the pharmacist to complete the previously mentioned procedural paperwork to accomplish the transfer. As it turns out, it is much easier for them to have the prescribing physicians do the work.   Possible solutions 1. You can ask your physician to assist you in weaning yourself off these medications. 2. Ask your pharmacy if the medication is in stock, 3 days prior to your refill. 3. If you need a pharmacy change, let us know at your medication management visit. Prescriptions that have already been electronically sent to a pharmacy will not be re-sent to a different pharmacy if your pharmacy of record does not have it in stock. Proper stocking of medication is a pharmacy problem, not a prescriber problem. Work with your pharmacist to solve the problem. 4. Have the [facility] Assembly add a provision to the "STOP ACT" (the law that mandates how controlled substances are prescribed) where there is an exception to the electronic prescribing rule that states that in  the event there are shortages of medications the physicians are allowed to use written prescriptions as opposed to electronic ones. This would allow patients to take their prescriptions to a different pharmacy that may have enough  medication available to fill the prescription. The problem is that currently there is a law that does not allow for written prescriptions, with the exception of instances where the electronic medical record is down due to technical issues.  5. Have [place] Congress ease the pressure on pharmaceutical companies, allowing them to produce enough quantities of the medication to adequately supply the population. 6. Have pharmacies keep enough stocks of these medications to cover their client base.  7. Have the [facility] Assembly add a provision to the "STOP ACT" where they ease the regulations surrounding the transfer of controlled substances between pharmacies, so as to simplify the transfer of supplies. As an alternative, develop a system to allow patients to obtain the remainder of their prescription at another one of their pharmacies or at an associate pharmacy.   How this shortage will affect you.  Understand that this is a pharmacy supply problem, not a prescriber problem. Work with your pharmacy to solve it. The job of the prescriber is to evaluate and monitor the patient for the appropriate indications and use of these medicines. It is not the job of the prescriber to supply the medication or to solve problems with that supply. The responsibility and the choice to obtain the medication resides on the patient. By law, supplying the medication is the job of the pharmacy. It is certainly not the job of the prescriber to solve supply problems.   Due to the above problems we are no longer taking patients to write for their pain medication. Future discussions with your physician may include potentially weaning medications or transitioning to alternatives.  We will be focusing primarily on interventional based pain management. We will continue to evaluate for appropriate indications and we may provide recommendations regarding medication, dose, and schedule, as well as monitoring recommendations, however,  we will not be taking over the actual prescribing of these substances. On those patients where we are treating their chronic pain with interventional therapies, exceptions will be considered on a case by case basis. At this time, we will try to continue providing this supplemental service to those patients we have been managing in the past. However, as of August 1st, 2023, we no longer will be sending additional prescriptions to other pharmacies for the purpose of solving their supply problems. Once we send a prescription to a pharmacy, we will not be resending it again to another pharmacy to cover for their shortages.   What to do. Write as many letters as you can. Recruit the help of family members in writing these letters. Below are some of the places where you can write to make your voice heard. Let them know what the problem is and push them to look for solutions.   Search internet for: "[employer] find your legislators" NoseSwap.is  Search internet for: "[employer] commissioner complaints" Starlas.fi  Search internet for: "[place] complaints" https://www.hernandez-brewer.com/.htm  Search internet for: "[facility] pharmacy complaints" Email [facility] Customer Relations woondaal.com.jsp?callType=store  Search internet for: Programme researcher, broadcasting/film/video customer service complaints" https://www.[facility].com/topic/marketing/contactus/contactus_customerservice.jsp  ____________________________________________________________________________________________  ____________________________________________________________________________________________  Drug Holidays (Slow)  What is a "Drug Holiday"? Drug Holiday: is the name given to the period of time during which a patient stops taking a medication(s) for the purpose of eliminating  tolerance  to the drug.  Benefits Improved effectiveness of opioids. Decreased opioid dose needed to achieve benefits. Improved pain with lesser dose.  What is tolerance? Tolerance: is the progressive decreased in effectiveness of a drug due to its repetitive use. With repetitive use, the body gets use to the medication and as a consequence, it loses its effectiveness. This is a common problem seen with opioid pain medications. As a result, a larger dose of the drug is needed to achieve the same effect that used to be obtained with a smaller dose.  How long should a "Drug Holiday" last? You should stay off of the pain medicine for at least 14 consecutive days. (2 weeks)  Should I stop the medicine "cold [place]"? No. You should always coordinate with your Pain Specialist so that he/she can provide you with the correct medication dose to make the transition as smoothly as possible.  How do I stop the medicine? Slowly. You will be instructed to decrease the daily amount of pills that you take by one (1) pill every seven (7) days. This is called a "slow downward taper" of your dose. For example: if you normally take four (4) pills per day, you will be asked to drop this dose to three (3) pills per day for seven (7) days, then to two (2) pills per day for seven (7) days, then to one (1) per day for seven (7) days, and at the end of those last seven (7) days, this is when the "Drug Holiday" would start.   Will I have withdrawals? By doing a "slow downward taper" like this one, it is unlikely that you will experience any significant withdrawal symptoms. Typically, what triggers withdrawals is the sudden stop of a high dose opioid therapy. Withdrawals can usually be avoided by slowly decreasing the dose over a prolonged period of time. If you do not follow these instructions and decide to stop your medication abruptly, withdrawals may be possible.  What are withdrawals? Withdrawals: refers to the wide range of  symptoms that occur after stopping or dramatically reducing opiate drugs after heavy and prolonged use. Withdrawal symptoms do not occur to patients that use low dose opioids, or those who take the medication sporadically. Contrary to benzodiazepine (example: Valium, Xanax, etc.) or alcohol withdrawals ("Delirium Tremens"), opioid withdrawals are not lethal. Withdrawals are the physical manifestation of the body getting rid of the excess receptors.  Expected Symptoms Early symptoms of withdrawal may include: Agitation Anxiety Muscle aches Increased tearing Insomnia Runny nose Sweating Yawning  Late symptoms of withdrawal may include: Abdominal cramping Diarrhea Dilated pupils Goose bumps Nausea Vomiting  Will I experience withdrawals? Due to the slow nature of the taper, it is very unlikely that you will experience any.  What is a slow taper? Taper: refers to the gradual decrease in dose.  (Last update: 09/01/2019) ____________________________________________________________________________________________   ____________________________________________________________________________________________  CBD (cannabidiol) & Delta-8 (Delta-8 tetrahydrocannabinol) WARNING  Intro: Cannabidiol (CBD) and tetrahydrocannabinol (THC), are two natural compounds found in plants of the Cannabis genus. They can both be extracted from hemp or cannabis. Hemp and cannabis come from the Cannabis sativa plant. Both compounds interact with your body's endocannabinoid system, but they have very different effects. CBD does not produce the high sensation associated with cannabis. Delta-8 tetrahydrocannabinol, also known as delta-8 THC, is a psychoactive substance found in the Cannabis sativa plant, of which marijuana and hemp are two varieties. THC is responsible for the high associated with the illicit use of marijuana.  Applicable to: All individuals currently taking or considering taking CBD  (cannabidiol) and, more important, all patients taking opioid analgesic controlled substances (pain medication). (Example: oxycodone; oxymorphone; hydrocodone; hydromorphone; morphine; methadone; tramadol; tapentadol; fentanyl; buprenorphine; butorphanol; dextromethorphan; meperidine; codeine; etc.)  Legal status: CBD remains a Schedule I drug prohibited for any use. CBD is illegal with one exception. In the [place], CBD has a limited Transport planner (FDA) approval for the treatment of two specific types of epilepsy disorders. Only one CBD product has been approved by the FDA for this purpose: "Epidiolex". FDA is aware that some companies are marketing products containing cannabis and cannabis-derived compounds in ways that violate the [employer], Drug and Cosmetic Act [employer] Act) and that may put the health and safety of consumers at risk. The FDA, a Federal agency, has not enforced the CBD status since 2018. UPDATE: (03/30/2021) The Drug Enforcement Agency ([place]) issued a letter stating that "delta" cannabinoids, including Delta-8-THCO and Delta-9-THCO, synthetically derived from hemp do not qualify as hemp and will be viewed as Schedule I drugs. (Schedule I drugs, substances, or chemicals are defined as drugs with no currently accepted medical use and a high potential for abuse. Some examples of Schedule I drugs are: heroin, lysergic acid diethylamide (LSD), marijuana (cannabis), 3,4-methylenedioxymethamphetamine (ecstasy), methaqualone, and peyote.) (https://jennings.com/)  Legality: Some manufacturers ship CBD products nationally, which is illegal. Often such products are sold online and are therefore available throughout the country. CBD is openly sold in head shops and health food stores in some states where such sales have not been explicitly legalized. Selling unapproved products with unsubstantiated therapeutic claims is not only a violation of the law, but also can put patients at  risk, as these products have not been proven to be safe or effective. Federal illegality makes it difficult to conduct research on CBD.  Reference: "FDA Regulation of Cannabis and Cannabis-Derived Products, Including Cannabidiol (CBD)" - [employer].pt  Warning: CBD is not FDA approved and has not undergo the same manufacturing controls as prescription drugs.  This means that the purity and safety of available CBD may be questionable. Most of the time, despite manufacturer's claims, it is contaminated with THC (delta-9-tetrahydrocannabinol - the chemical in marijuana responsible for the "HIGH").  When this is the case, the [facility] contaminant will trigger a positive urine drug screen (UDS) test for Marijuana (carboxy-THC). Because a positive UDS for any illicit substance is a violation of our medication agreement, your opioid analgesics (pain medicine) may be permanently discontinued. The FDA recently put out a warning about 5 things that everyone should be aware of regarding Delta-8 THC: Delta-8 THC products have not been evaluated or approved by the FDA for safe use and may be marketed in ways that put the public health at risk. The FDA has received adverse event reports involving delta-8 THC-containing products. Delta-8 THC has psychoactive and intoxicating effects. Delta-8 THC manufacturing often involve use of potentially harmful chemicals to create the concentrations of delta-8 THC claimed in the marketplace. The final delta-8 THC product may have potentially harmful by-products (contaminants) due to the chemicals used in the process. Manufacturing of delta-8 THC products may occur in uncontrolled or unsanitary settings, which may lead to the presence of unsafe contaminants or other potentially harmful substances. Delta-8 THC products should be kept out of the reach of children and  pets.  MORE ABOUT CBD  General Information: CBD was discovered in [birth year] and it is a derivative of the cannabis sativa genus plants (Marijuana  and Hemp). It is one of the 113 identified substances found in Marijuana. It accounts for up to 40% of the plant's extract. As of 2018, preliminary clinical studies on CBD included research for the treatment of anxiety, movement disorders, and pain. CBD is available and consumed in multiple forms, including inhalation of smoke or vapor, as an aerosol spray, and by mouth. It may be supplied as an oil containing CBD, capsules, dried cannabis, or as a liquid solution. CBD is thought not to be as psychoactive as THC (delta-9-tetrahydrocannabinol - the chemical in marijuana responsible for the "HIGH"). Studies suggest that CBD may interact with different biological target receptors in the body, including cannabinoid and other neurotransmitter receptors. As of 2018 the mechanism of action for its biological effects has not been determined.  Side-effects  Adverse reactions: Dry mouth, diarrhea, decreased appetite, fatigue, drowsiness, malaise, weakness, sleep disturbances, and others.  Drug interactions: CBC may interact with other medications such as blood-thinners. Because CBD causes drowsiness on its own, it also increases the drowsiness caused by other medications, including antihistamines (such as Benadryl), benzodiazepines (Xanax, Ativan, Valium), antipsychotics, antidepressants and opioids, as well as alcohol and supplements such as kava, melatonin and St. John's Wort. Be cautious with the following combinations:   Brivaracetam (Briviact) Brivaracetam is changed and broken down by the body. CBD might decrease how quickly the body breaks down brivaracetam. This might increase levels of brivaracetam in the body.  Caffeine Caffeine is changed and broken down by the body. CBD might decrease how quickly the body breaks down caffeine. This might increase levels of  caffeine in the body.  Carbamazepine (Tegretol) Carbamazepine is changed and broken down by the body. CBD might decrease how quickly the body breaks down carbamazepine. This might increase levels of carbamazepine in the body and increase its side effects.  Citalopram (Celexa) Citalopram is changed and broken down by the body. CBD might decrease how quickly the body breaks down citalopram. This might increase levels of citalopram in the body and increase its side effects.  Clobazam (Onfi) Clobazam is changed and broken down by the liver. CBD might decrease how quickly the liver breaks down clobazam. This might increase the effects and side effects of clobazam.  Eslicarbazepine (Aptiom) Eslicarbazepine is changed and broken down by the body. CBD might decrease how quickly the body breaks down eslicarbazepine. This might increase levels of eslicarbazepine in the body by a small amount.  Everolimus (Zostress) Everolimus is changed and broken down by the body. CBD might decrease how quickly the body breaks down everolimus. This might increase levels of everolimus in the body.  Lithium Taking higher doses of CBD might increase levels of lithium. This can increase the risk of lithium toxicity.  Medications changed by the liver (Cytochrome P450 1A1 (CYP1A1) substrates) Some medications are changed and broken down by the liver. CBD might change how quickly the liver breaks down these medications. This could change the effects and side effects of these medications.  Medications changed by the liver (Cytochrome P450 1A2 (CYP1A2) substrates) Some medications are changed and broken down by the liver. CBD might change how quickly the liver breaks down these medications. This could change the effects and side effects of these medications.  Medications changed by the liver (Cytochrome P450 1B1 (CYP1B1) substrates) Some medications are changed and broken down by the liver. CBD might change how quickly the  liver breaks down these medications. This could change the effects and side effects of these medications.  Medications changed  by the liver (Cytochrome P450 2A6 (CYP2A6) substrates) Some medications are changed and broken down by the liver. CBD might change how quickly the liver breaks down these medications. This could change the effects and side effects of these medications.  Medications changed by the liver (Cytochrome P450 2B6 (CYP2B6) substrates) Some medications are changed and broken down by the liver. CBD might change how quickly the liver breaks down these medications. This could change the effects and side effects of these medications.  Medications changed by the liver (Cytochrome P450 2C19 (CYP2C19) substrates) Some medications are changed and broken down by the liver. CBD might change how quickly the liver breaks down these medications. This could change the effects and side effects of these medications.  Medications changed by the liver (Cytochrome P450 2C8 (CYP2C8) substrates) Some medications are changed and broken down by the liver. CBD might change how quickly the liver breaks down these medications. This could change the effects and side effects of these medications.  Medications changed by the liver (Cytochrome P450 2C9 (CYP2C9) substrates) Some medications are changed and broken down by the liver. CBD might change how quickly the liver breaks down these medications. This could change the effects and side effects of these medications.  Medications changed by the liver (Cytochrome P450 2D6 (CYP2D6) substrates) Some medications are changed and broken down by the liver. CBD might change how quickly the liver breaks down these medications. This could change the effects and side effects of these medications.  Medications changed by the liver (Cytochrome P450 2E1 (CYP2E1) substrates) Some medications are changed and broken down by the liver. CBD might change how quickly the liver  breaks down these medications. This could change the effects and side effects of these medications.  Medications changed by the liver (Cytochrome P450 3A4 (CYP3A4) substrates) Some medications are changed and broken down by the liver. CBD might change how quickly the liver breaks down these medications. This could change the effects and side effects of these medications.  Medications changed by the liver (Glucuronidated drugs) Some medications are changed and broken down by the liver. CBD might change how quickly the liver breaks down these medications. This could change the effects and side effects of these medications.  Medications that decrease the breakdown of other medications by the liver (Cytochrome P450 2C19 (CYP2C19) inhibitors) CBD is changed and broken down by the liver. Some drugs decrease how quickly the liver changes and breaks down CBD. This could change the effects and side effects of CBD.  Medications that decrease the breakdown of other medications in the liver (Cytochrome P450 3A4 (CYP3A4) inhibitors) CBD is changed and broken down by the liver. Some drugs decrease how quickly the liver changes and breaks down CBD. This could change the effects and side effects of CBD.  Medications that increase breakdown of other medications by the liver (Cytochrome P450 3A4 (CYP3A4) inducers) CBD is changed and broken down by the liver. Some drugs increase how quickly the liver changes and breaks down CBD. This could change the effects and side effects of CBD.  Medications that increase the breakdown of other medications by the liver (Cytochrome P450 2C19 (CYP2C19) inducers) CBD is changed and broken down by the liver. Some drugs increase how quickly the liver changes and breaks down CBD. This could change the effects and side effects of CBD.  Methadone (Dolophine) Methadone is broken down by the liver. CBD might decrease how quickly the liver breaks down methadone. Taking cannabidiol along  with methadone might  increase the effects and side effects of methadone.  Rufinamide (Banzel) Rufinamide is changed and broken down by the body. CBD might decrease how quickly the body breaks down rufinamide. This might increase levels of rufinamide in the body by a small amount.  Sedative medications (CNS depressants) CBD might cause sleepiness and slowed breathing. Some medications, called sedatives, can also cause sleepiness and slowed breathing. Taking CBD with sedative medications might cause breathing problems and/or too much sleepiness.  Sirolimus (Rapamune) Sirolimus is changed and broken down by the body. CBD might decrease how quickly the body breaks down sirolimus. This might increase levels of sirolimus in the body.  Stiripentol (Diacomit) Stiripentol is changed and broken down by the body. CBD might decrease how quickly the body breaks down stiripentol. This might increase levels of stiripentol in the body and increase its side effects.  Tacrolimus (Prograf) Tacrolimus is changed and broken down by the body. CBD might decrease how quickly the body breaks down tacrolimus. This might increase levels of tacrolimus in the body.  Tamoxifen (Soltamox) Tamoxifen is changed and broken down by the body. CBD might affect how quickly the body breaks down tamoxifen. This might affect levels of tamoxifen in the body.  Topiramate (Topamax) Topiramate is changed and broken down by the body. CBD might decrease how quickly the body breaks down topiramate. This might increase levels of topiramate in the body by a small amount.  Valproate Valproic acid can cause liver injury. Taking cannabidiol with valproic acid might increase the chance of liver injury. CBD and/or valproic acid might need to be stopped, or the dose might need to be reduced.  Warfarin (Coumadin) CBD might increase levels of warfarin, which can increase the risk for bleeding. CBD and/or warfarin might need to be stopped, or the  dose might need to be reduced.  Zonisamide Zonisamide is changed and broken down by the body. CBD might decrease how quickly the body breaks down zonisamide. This might increase levels of zonisamide in the body by a small amount. (Last update: 04/11/2021) ____________________________________________________________________________________________

## 2021-11-28 ENCOUNTER — Ambulatory Visit (HOSPITAL_BASED_OUTPATIENT_CLINIC_OR_DEPARTMENT_OTHER): Payer: Medicare Other | Admitting: Pain Medicine

## 2021-11-28 DIAGNOSIS — M545 Low back pain, unspecified: Secondary | ICD-10-CM

## 2021-11-28 DIAGNOSIS — M160 Bilateral primary osteoarthritis of hip: Secondary | ICD-10-CM

## 2021-11-28 DIAGNOSIS — G8929 Other chronic pain: Secondary | ICD-10-CM

## 2021-11-28 DIAGNOSIS — M47816 Spondylosis without myelopathy or radiculopathy, lumbar region: Secondary | ICD-10-CM

## 2021-11-28 DIAGNOSIS — Z79891 Long term (current) use of opiate analgesic: Secondary | ICD-10-CM

## 2021-11-28 DIAGNOSIS — G894 Chronic pain syndrome: Secondary | ICD-10-CM

## 2021-11-28 DIAGNOSIS — M5137 Other intervertebral disc degeneration, lumbosacral region: Secondary | ICD-10-CM

## 2021-11-28 DIAGNOSIS — M461 Sacroiliitis, not elsewhere classified: Secondary | ICD-10-CM

## 2021-11-28 DIAGNOSIS — Z79899 Other long term (current) drug therapy: Secondary | ICD-10-CM

## 2021-11-28 DIAGNOSIS — Z91199 Patient's noncompliance with other medical treatment and regimen due to unspecified reason: Secondary | ICD-10-CM

## 2021-11-29 ENCOUNTER — Ambulatory Visit: Payer: Medicare Other | Admitting: Pain Medicine

## 2021-11-29 ENCOUNTER — Encounter: Payer: Self-pay | Admitting: Pain Medicine

## 2021-11-29 ENCOUNTER — Ambulatory Visit: Payer: Medicare Other | Attending: Pain Medicine | Admitting: Pain Medicine

## 2021-11-29 VITALS — BP 137/79 | HR 69 | Temp 97.4°F | Resp 18 | Ht 71.5 in | Wt 199.0 lb

## 2021-11-29 DIAGNOSIS — M5137 Other intervertebral disc degeneration, lumbosacral region: Secondary | ICD-10-CM | POA: Diagnosis not present

## 2021-11-29 DIAGNOSIS — Z79899 Other long term (current) drug therapy: Secondary | ICD-10-CM | POA: Diagnosis not present

## 2021-11-29 DIAGNOSIS — G894 Chronic pain syndrome: Secondary | ICD-10-CM | POA: Diagnosis not present

## 2021-11-29 DIAGNOSIS — M160 Bilateral primary osteoarthritis of hip: Secondary | ICD-10-CM

## 2021-11-29 DIAGNOSIS — M4186 Other forms of scoliosis, lumbar region: Secondary | ICD-10-CM

## 2021-11-29 DIAGNOSIS — M25551 Pain in right hip: Secondary | ICD-10-CM

## 2021-11-29 DIAGNOSIS — M461 Sacroiliitis, not elsewhere classified: Secondary | ICD-10-CM | POA: Diagnosis not present

## 2021-11-29 DIAGNOSIS — M47816 Spondylosis without myelopathy or radiculopathy, lumbar region: Secondary | ICD-10-CM | POA: Diagnosis not present

## 2021-11-29 DIAGNOSIS — Z79891 Long term (current) use of opiate analgesic: Secondary | ICD-10-CM

## 2021-11-29 DIAGNOSIS — M545 Low back pain, unspecified: Secondary | ICD-10-CM

## 2021-11-29 DIAGNOSIS — G8929 Other chronic pain: Secondary | ICD-10-CM

## 2021-11-29 DIAGNOSIS — M51379 Other intervertebral disc degeneration, lumbosacral region without mention of lumbar back pain or lower extremity pain: Secondary | ICD-10-CM

## 2021-11-29 DIAGNOSIS — M79604 Pain in right leg: Secondary | ICD-10-CM

## 2021-11-29 MED ORDER — OXYCODONE HCL 5 MG PO TABS: 5.0000 mg | ORAL_TABLET | Freq: Two times a day (BID) | ORAL | 0 refills | Status: DC | PRN

## 2021-11-29 MED ORDER — NALOXONE HCL 4 MG/0.1ML NA LIQD: 1.0000 | NASAL | 0 refills | Status: DC | PRN

## 2021-11-29 NOTE — Patient Instructions (Signed)
Naloxone Nasal Spray What is this medication? NALOXONE (nal OX one) treats opioid overdose, which causes slow or shallow breathing, severe drowsiness, or trouble staying awake. Call emergency services after using this medication. You may need additional treatment. Naloxone works by reversing the effects of opioids. It belongs to a group of medications called opioid blockers. This medicine may be used for other purposes; ask your health care provider or pharmacist if you have questions. COMMON BRAND NAME(S): Kloxxado, Narcan What should I tell my care team before I take this medication? They need to know if you have any of these conditions: Heart disease Substance use disorder An unusual or allergic reaction to naloxone, other medications, foods, dyes, or preservatives Pregnant or trying to get pregnant Breast-feeding How should I use this medication? This medication is for use in the nose. Lay the person on their back. Support their neck with your hand and allow the head to tilt back before giving the medication. The nasal spray should be given into 1 nostril. After giving the medication, move the person onto their side. Do not remove or test the nasal spray until ready to use. Get emergency medical help right away after giving the first dose of this medication, even if the person wakes up. You should be familiar with how to recognize the signs and symptoms of a narcotic overdose. If more doses are needed, give the additional dose in the other nostril. Talk to your care team about the use of this medication in children. While this medication may be prescribed for children as young as newborns for selected conditions, precautions do apply. Overdosage: If you think you have taken too much of this medicine contact a poison control center or emergency room at once. NOTE: This medicine is only for you. Do not share this medicine with others. What if I miss a dose? This does not apply. What may  interact with this medication? This is only used during an emergency. No interactions are expected during emergency use. This list may not describe all possible interactions. Give your health care provider a list of all the medicines, herbs, non-prescription drugs, or dietary supplements you use. Also tell them if you smoke, drink alcohol, or use illegal drugs. Some items may interact with your medicine. What should I watch for while using this medication? Keep this medication ready for use in the case of an opioid overdose. Make sure that you have the phone number of your care team and local hospital ready. You may need to have additional doses of this medication. Each nasal spray contains a single dose. Some emergencies may require additional doses. After use, bring the treated person to the nearest hospital or call 911. Make sure the treating care team knows that the person has received a dose of this medication. You will receive additional instructions on what to do during and after use of this medication before an emergency occurs. What side effects may I notice from receiving this medication? Side effects that you should report to your care team as soon as possible: Allergic reactions--skin rash, itching, hives, swelling of the face, lips, tongue, or throat Side effects that usually do not require medical attention (report these to your care team if they continue or are bothersome): Constipation Dryness or irritation inside the nose Headache Increase in blood pressure Muscle spasms Stuffy nose Toothache This list may not describe all possible side effects. Call your doctor for medical advice about side effects. You may report side effects to FDA  at 1-800-FDA-1088. Where should I keep my medication? Keep out of the reach of children and pets. Store between 20 and 25 degrees C (68 and 77 degrees F). Do not freeze. Throw away any unused medication after the expiration date. Keep in original  box until ready to use. NOTE: This sheet is a summary. It may not cover all possible information. If you have questions about this medicine, talk to your doctor, pharmacist, or health care provider.  2023 Elsevier/Gold Standard (2020-12-25 00:00:00) ____________________________________________________________________________________________  Medication Rules  Purpose: To inform patients, and their family members, of our rules and regulations.  Applies to: All patients receiving prescriptions (written or electronic).  Pharmacy of record: Pharmacy where electronic prescriptions will be sent. If written prescriptions are taken to a different pharmacy, please inform the nursing staff. The pharmacy listed in the electronic medical record should be the one where you would like electronic prescriptions to be sent.  Electronic prescriptions: In compliance with the [place] (STOP) Act of 2017 (Session Lanny Cramp [phone]), effective February 11, 2018, all controlled substances must be electronically prescribed. Calling prescriptions to the pharmacy will cease to exist.  Prescription refills: Only during scheduled appointments. Applies to all prescriptions.  NOTE: The following applies primarily to controlled substances (Opioid* Pain Medications).   Type of encounter (visit): For patients receiving controlled substances, face-to-face visits are required. (Not an option or up to the patient.)  Patient's responsibilities: Pain Pills: Bring all pain pills to every appointment (except for procedure appointments). Pill Bottles: Bring pills in original pharmacy bottle. Always bring the newest bottle. Bring bottle, even if empty. Medication refills: You are responsible for knowing and keeping track of what medications you take and those you need refilled. The day before your appointment: write a list of all prescriptions that need to be refilled. The day of the  appointment: give the list to the admitting nurse. Prescriptions will be written only during appointments. No prescriptions will be written on procedure days. If you forget a medication: it will not be "Called in", "Faxed", or "electronically sent". You will need to get another appointment to get these prescribed. No early refills. Do not call asking to have your prescription filled early. Prescription Accuracy: You are responsible for carefully inspecting your prescriptions before leaving our office. Have the discharge nurse carefully go over each prescription with you, before taking them home. Make sure that your name is accurately spelled, that your address is correct. Check the name and dose of your medication to make sure it is accurate. Check the number of pills, and the written instructions to make sure they are clear and accurate. Make sure that you are given enough medication to last until your next medication refill appointment. Taking Medication: Take medication as prescribed. When it comes to controlled substances, taking less pills or less frequently than prescribed is permitted and encouraged. Never take more pills than instructed. Never take medication more frequently than prescribed.  Inform other Doctors: Always inform, all of your healthcare providers, of all the medications you take. Pain Medication from other Providers: You are not allowed to accept any additional pain medication from any other Doctor or Healthcare provider. There are two exceptions to this rule. (see below) In the event that you require additional pain medication, you are responsible for notifying us, as stated below. Cough Medicine: Often these contain an opioid, such as codeine or hydrocodone. Never accept or take cough medicine containing these opioids if you are already taking  an opioid* medication. The combination may cause respiratory failure and death. Medication Agreement: You are responsible for carefully  reading and following our Medication Agreement. This must be signed before receiving any prescriptions from our practice. Safely store a copy of your signed Agreement. Violations to the Agreement will result in no further prescriptions. (Additional copies of our Medication Agreement are available upon request.) Laws, Rules, & Regulations: All patients are expected to follow all Federal and [employer], [employer], Rules, [employer] [facility]. Ignorance of the Laws does not constitute a valid excuse.  Illegal drugs and Controlled Substances: The use of illegal substances (including, but not limited to marijuana and its derivatives) and/or the illegal use of any controlled substances is strictly prohibited. Violation of this rule may result in the immediate and permanent discontinuation of any and all prescriptions being written by our practice. The use of any illegal substances is prohibited. Adopted CDC guidelines & recommendations: Target dosing levels will be at or below 60 MME/day. Use of benzodiazepines** is not recommended.  Exceptions: There are only two exceptions to the rule of not receiving pain medications from other Healthcare Providers. Exception #1 (Emergencies): In the event of an emergency (i.e.: accident requiring emergency care), you are allowed to receive additional pain medication. However, you are responsible for: As soon as you are able, call our office [phone], at any time of the day or night, and leave a message stating your name, the date and nature of the emergency, and the name and dose of the medication prescribed. In the event that your call is answered by a member of our staff, make sure to document and save the date, time, and the name of the person that took your information.  Exception #2 (Planned Surgery): In the event that you are scheduled by another doctor or dentist to have any type of surgery or procedure, you are allowed (for a period no longer than 30 days), to receive  additional pain medication, for the acute post-op pain. However, in this case, you are responsible for picking up a copy of our "Post-op Pain Management for Surgeons" handout, and giving it to your surgeon or dentist. This document is available at our office, and does not require an appointment to obtain it. Simply go to our office during business hours (Monday-Thursday from 8:00 AM to 4:00 PM) (Friday 8:00 AM to 12:00 Noon) or if you have a scheduled appointment with [place], prior to your surgery, and ask for it by name. In addition, you are responsible for: calling our office [phone], at any time of the day or night, and leaving a message stating your name, name of your surgeon, type of surgery, and date of procedure or surgery. Failure to comply with your responsibilities may result in termination of therapy involving the controlled substances. Medication Agreement Violation. Following the above rules, including your responsibilities will help you in avoiding a Medication Agreement Violation ("Breaking your Pain Medication Contract").  *Opioid medications include: morphine, codeine, oxycodone, oxymorphone, hydrocodone, hydromorphone, meperidine, tramadol, tapentadol, buprenorphine, fentanyl, methadone. **Benzodiazepine medications include: diazepam (Valium), alprazolam (Xanax), clonazepam (Klonopine), lorazepam (Ativan), clorazepate (Tranxene), chlordiazepoxide (Librium), estazolam (Prosom), oxazepam (Serax), temazepam (Restoril), triazolam (Halcion) (Last updated: 11/08/2020) ____________________________________________________________________________________________  ____________________________________________________________________________________________  Medication Recommendations and Reminders  Applies to: All patients receiving prescriptions (written and/or electronic).  Medication Rules & Regulations: These rules and regulations exist for your safety and that of others. They are not  flexible and neither are we. Dismissing or ignoring them will be considered "non-compliance"  with medication therapy, resulting in complete and irreversible termination of such therapy. (See document titled "Medication Rules" for more details.) In all conscience, because of safety reasons, we cannot continue providing a therapy where the patient does not follow instructions.  Pharmacy of record:  Definition: This is the pharmacy where your electronic prescriptions will be sent.  We do not endorse any particular pharmacy, however, we have experienced problems with [facility] not securing enough medication supply for the community. We do not restrict you in your choice of pharmacy. However, once we write for your prescriptions, we will NOT be re-sending more prescriptions to fix restricted supply problems created by your pharmacy, or your insurance.  The pharmacy listed in the electronic medical record should be the one where you want electronic prescriptions to be sent. If you choose to change pharmacy, simply notify our nursing staff.  Recommendations: Keep all of your pain medications in a safe place, under lock and key, even if you live alone. We will NOT replace lost, stolen, or damaged medication. After you fill your prescription, take 1 week's worth of pills and put them away in a safe place. You should keep a separate, properly labeled bottle for this purpose. The remainder should be kept in the original bottle. Use this as your primary supply, until it runs out. Once it's gone, then you know that you have 1 week's worth of medicine, and it is time to come in for a prescription refill. If you do this correctly, it is unlikely that you will ever run out of medicine. To make sure that the above recommendation works, it is very important that you make sure your medication refill appointments are scheduled at least 1 week before you run out of medicine. To do this in an effective manner, make sure that  you do not leave the office without scheduling your next medication management appointment. Always ask the nursing staff to show you in your prescription , when your medication will be running out. Then arrange for the receptionist to get you a return appointment, at least 7 days before you run out of medicine. Do not wait until you have 1 or 2 pills left, to come in. This is very poor planning and does not take into consideration that we may need to cancel appointments due to bad weather, sickness, or emergencies affecting our staff. DO NOT ACCEPT A "Partial Fill": If for any reason your pharmacy does not have enough pills/tablets to completely fill or refill your prescription, do not allow for a "partial fill". The law allows the pharmacy to complete that prescription within 72 hours, without requiring a new prescription. If they do not fill the rest of your prescription within those 72 hours, you will need a separate prescription to fill the remaining amount, which we will NOT provide. If the reason for the partial fill is your insurance, you will need to talk to the pharmacist about payment alternatives for the remaining tablets, but again, DO NOT ACCEPT A PARTIAL FILL, unless you can trust your pharmacist to obtain the remainder of the pills within 72 hours.  Prescription refills and/or changes in medication(s):  Prescription refills, and/or changes in dose or medication, will be conducted only during scheduled medication management appointments. (Applies to both, written and electronic prescriptions.) No refills on procedure days. No medication will be changed or started on procedure days. No changes, adjustments, and/or refills will be conducted on a procedure day. Doing so will interfere with the diagnostic portion  of the procedure. No phone refills. No medications will be "called into the pharmacy". No Fax refills. No weekend refills. No Holliday refills. No after hours refills.  Remember:   Business hours are:  Monday to Thursday 8:00 AM to 4:00 PM Provider's Schedule: Milinda Pointer, MD - Appointments are:  Medication management: Monday and Wednesday 8:00 AM to 4:00 PM Procedure day: Tuesday and Thursday 7:30 AM to 4:00 PM Gillis Santa, MD - Appointments are:  Medication management: Tuesday and Thursday 8:00 AM to 4:00 PM Procedure day: Monday and Wednesday 7:30 AM to 4:00 PM (Last update: 09/01/2019) ____________________________________________________________________________________________  ____________________________________________________________________________________________  Pharmacy Shortages of Pain Medication   Introduction Shockingly as it may seem, .  "No U.[place]. Supreme Court decision has ever interpreted the Constitution as guaranteeing a right to health care for all Americans." - https://huff.com/  "With respect to human rights, the [place] has no formally codified right to health, nor does it participate in a human rights treaty that specifies a right to health." - Scott J. Schweikart, JD, MBE  Situation By now, most of our patients have had the experience of being told by their pharmacist that they do not have enough medication to cover their prescription. If you have not had this experience, just know that you soon will.  Problem There appears to be a shortage of these medications, either at the national level or locally. This is happening with all pharmacies. When there is not enough medication, patients are offered a partial fill and they are told that they will try to get the rest of the medicine for them at a later time. If they do not have enough for even a partial fill, the pharmacists are telling the patients to call us (the prescribing physicians) to request that we send another prescription to another pharmacy to get the medicine.   This reordering of a controlled  substance creates documentation problems where additional paperwork needs to be created to explain why two prescriptions for the same period of time and the same medicine are being prescribed to the same patient. It also creates situations where the last appointment note does not accurately reflect when and what prescriptions were given to a patient. This leads to prescribing errors down the line, in subsequent follow-up visits.   [employer] of Pharmacy ([employer]) Research revealed that Surveyor, quantity .1806 (21 NCAC 46.1806) authorizes pharmacists to the transfer of prescriptions among pharmacies, and it sets forth procedural and recordkeeping requirements for doing so. However, this requires the pharmacist to complete the previously mentioned procedural paperwork to accomplish the transfer. As it turns out, it is much easier for them to have the prescribing physicians do the work.   Possible solutions 1. You can ask your physician to assist you in weaning yourself off these medications. 2. Ask your pharmacy if the medication is in stock, 3 days prior to your refill. 3. If you need a pharmacy change, let us know at your medication management visit. Prescriptions that have already been electronically sent to a pharmacy will not be re-sent to a different pharmacy if your pharmacy of record does not have it in stock. Proper stocking of medication is a pharmacy problem, not a prescriber problem. Work with your pharmacist to solve the problem. 4. Have the [facility] Assembly add a provision to the "STOP ACT" (the law that mandates how controlled substances are prescribed) where there is an exception to the electronic prescribing rule that states that in  the event there are shortages of medications the physicians are allowed to use written prescriptions as opposed to electronic ones. This would allow patients to take their prescriptions to a different pharmacy that may have enough  medication available to fill the prescription. The problem is that currently there is a law that does not allow for written prescriptions, with the exception of instances where the electronic medical record is down due to technical issues.  5. Have [place] Congress ease the pressure on pharmaceutical companies, allowing them to produce enough quantities of the medication to adequately supply the population. 6. Have pharmacies keep enough stocks of these medications to cover their client base.  7. Have the [facility] Assembly add a provision to the "STOP ACT" where they ease the regulations surrounding the transfer of controlled substances between pharmacies, so as to simplify the transfer of supplies. As an alternative, develop a system to allow patients to obtain the remainder of their prescription at another one of their pharmacies or at an associate pharmacy.   How this shortage will affect you.  Understand that this is a pharmacy supply problem, not a prescriber problem. Work with your pharmacy to solve it. The job of the prescriber is to evaluate and monitor the patient for the appropriate indications and use of these medicines. It is not the job of the prescriber to supply the medication or to solve problems with that supply. The responsibility and the choice to obtain the medication resides on the patient. By law, supplying the medication is the job of the pharmacy. It is certainly not the job of the prescriber to solve supply problems.   Due to the above problems we are no longer taking patients to write for their pain medication. Future discussions with your physician may include potentially weaning medications or transitioning to alternatives.  We will be focusing primarily on interventional based pain management. We will continue to evaluate for appropriate indications and we may provide recommendations regarding medication, dose, and schedule, as well as monitoring recommendations, however,  we will not be taking over the actual prescribing of these substances. On those patients where we are treating their chronic pain with interventional therapies, exceptions will be considered on a case by case basis. At this time, we will try to continue providing this supplemental service to those patients we have been managing in the past. However, as of August 1st, 2023, we no longer will be sending additional prescriptions to other pharmacies for the purpose of solving their supply problems. Once we send a prescription to a pharmacy, we will not be resending it again to another pharmacy to cover for their shortages.   What to do. Write as many letters as you can. Recruit the help of family members in writing these letters. Below are some of the places where you can write to make your voice heard. Let them know what the problem is and push them to look for solutions.   Search internet for: "[employer] find your legislators" NoseSwap.is  Search internet for: "[employer] commissioner complaints" Starlas.fi  Search internet for: "[place] complaints" https://www.hernandez-brewer.com/.htm  Search internet for: "[facility] pharmacy complaints" Email [facility] Customer Relations woondaal.com.jsp?callType=store  Search internet for: Programme researcher, broadcasting/film/video customer service complaints" https://www.[facility].com/topic/marketing/contactus/contactus_customerservice.jsp  ____________________________________________________________________________________________  ____________________________________________________________________________________________  Drug Holidays (Slow)  What is a "Drug Holiday"? Drug Holiday: is the name given to the period of time during which a patient stops taking a medication(s) for the purpose of eliminating  tolerance  to the drug.  Benefits Improved effectiveness of opioids. Decreased opioid dose needed to achieve benefits. Improved pain with lesser dose.  What is tolerance? Tolerance: is the progressive decreased in effectiveness of a drug due to its repetitive use. With repetitive use, the body gets use to the medication and as a consequence, it loses its effectiveness. This is a common problem seen with opioid pain medications. As a result, a larger dose of the drug is needed to achieve the same effect that used to be obtained with a smaller dose.  How long should a "Drug Holiday" last? You should stay off of the pain medicine for at least 14 consecutive days. (2 weeks)  Should I stop the medicine "cold [place]"? No. You should always coordinate with your Pain Specialist so that he/she can provide you with the correct medication dose to make the transition as smoothly as possible.  How do I stop the medicine? Slowly. You will be instructed to decrease the daily amount of pills that you take by one (1) pill every seven (7) days. This is called a "slow downward taper" of your dose. For example: if you normally take four (4) pills per day, you will be asked to drop this dose to three (3) pills per day for seven (7) days, then to two (2) pills per day for seven (7) days, then to one (1) per day for seven (7) days, and at the end of those last seven (7) days, this is when the "Drug Holiday" would start.   Will I have withdrawals? By doing a "slow downward taper" like this one, it is unlikely that you will experience any significant withdrawal symptoms. Typically, what triggers withdrawals is the sudden stop of a high dose opioid therapy. Withdrawals can usually be avoided by slowly decreasing the dose over a prolonged period of time. If you do not follow these instructions and decide to stop your medication abruptly, withdrawals may be possible.  What are withdrawals? Withdrawals: refers to the wide range of  symptoms that occur after stopping or dramatically reducing opiate drugs after heavy and prolonged use. Withdrawal symptoms do not occur to patients that use low dose opioids, or those who take the medication sporadically. Contrary to benzodiazepine (example: Valium, Xanax, etc.) or alcohol withdrawals ("Delirium Tremens"), opioid withdrawals are not lethal. Withdrawals are the physical manifestation of the body getting rid of the excess receptors.  Expected Symptoms Early symptoms of withdrawal may include: Agitation Anxiety Muscle aches Increased tearing Insomnia Runny nose Sweating Yawning  Late symptoms of withdrawal may include: Abdominal cramping Diarrhea Dilated pupils Goose bumps Nausea Vomiting  Will I experience withdrawals? Due to the slow nature of the taper, it is very unlikely that you will experience any.  What is a slow taper? Taper: refers to the gradual decrease in dose.  (Last update: 09/01/2019) ____________________________________________________________________________________________   ____________________________________________________________________________________________  CBD (cannabidiol) & Delta-8 (Delta-8 tetrahydrocannabinol) WARNING  Intro: Cannabidiol (CBD) and tetrahydrocannabinol (THC), are two natural compounds found in plants of the Cannabis genus. They can both be extracted from hemp or cannabis. Hemp and cannabis come from the Cannabis sativa plant. Both compounds interact with your body's endocannabinoid system, but they have very different effects. CBD does not produce the high sensation associated with cannabis. Delta-8 tetrahydrocannabinol, also known as delta-8 THC, is a psychoactive substance found in the Cannabis sativa plant, of which marijuana and hemp are two varieties. THC is responsible for the high associated with the illicit use of marijuana.  Applicable to: All individuals currently taking or considering taking CBD  (cannabidiol) and, more important, all patients taking opioid analgesic controlled substances (pain medication). (Example: oxycodone; oxymorphone; hydrocodone; hydromorphone; morphine; methadone; tramadol; tapentadol; fentanyl; buprenorphine; butorphanol; dextromethorphan; meperidine; codeine; etc.)  Legal status: CBD remains a Schedule I drug prohibited for any use. CBD is illegal with one exception. In the [place], CBD has a limited Transport planner (FDA) approval for the treatment of two specific types of epilepsy disorders. Only one CBD product has been approved by the FDA for this purpose: "Epidiolex". FDA is aware that some companies are marketing products containing cannabis and cannabis-derived compounds in ways that violate the [employer], Drug and Cosmetic Act Gov [facility] Act) and that may put the health and safety of consumers at risk. The FDA, a Federal agency, has not enforced the CBD status since 2018. UPDATE: (03/30/2021) The Drug Enforcement Agency ([place]) issued a letter stating that "delta" cannabinoids, including Delta-8-THCO and Delta-9-THCO, synthetically derived from hemp do not qualify as hemp and will be viewed as Schedule I drugs. (Schedule I drugs, substances, or chemicals are defined as drugs with no currently accepted medical use and a high potential for abuse. Some examples of Schedule I drugs are: heroin, lysergic acid diethylamide (LSD), marijuana (cannabis), 3,4-methylenedioxymethamphetamine (ecstasy), methaqualone, and peyote.) (https://jennings.com/)  Legality: Some manufacturers ship CBD products nationally, which is illegal. Often such products are sold online and are therefore available throughout the country. CBD is openly sold in head shops and health food stores in some states where such sales have not been explicitly legalized. Selling unapproved products with unsubstantiated therapeutic claims is not only a violation of the law, but also can put patients at  risk, as these products have not been proven to be safe or effective. Federal illegality makes it difficult to conduct research on CBD.  Reference: "FDA Regulation of Cannabis and Cannabis-Derived Products, Including Cannabidiol (CBD)" - [employer].pt  Warning: CBD is not FDA approved and has not undergo the same manufacturing controls as prescription drugs.  This means that the purity and safety of available CBD may be questionable. Most of the time, despite manufacturer's claims, it is contaminated with THC (delta-9-tetrahydrocannabinol - the chemical in marijuana responsible for the "HIGH").  When this is the case, the [facility] contaminant will trigger a positive urine drug screen (UDS) test for Marijuana (carboxy-THC). Because a positive UDS for any illicit substance is a violation of our medication agreement, your opioid analgesics (pain medicine) may be permanently discontinued. The FDA recently put out a warning about 5 things that everyone should be aware of regarding Delta-8 THC: Delta-8 THC products have not been evaluated or approved by the FDA for safe use and may be marketed in ways that put the public health at risk. The FDA has received adverse event reports involving delta-8 THC-containing products. Delta-8 THC has psychoactive and intoxicating effects. Delta-8 THC manufacturing often involve use of potentially harmful chemicals to create the concentrations of delta-8 THC claimed in the marketplace. The final delta-8 THC product may have potentially harmful by-products (contaminants) due to the chemicals used in the process. Manufacturing of delta-8 THC products may occur in uncontrolled or unsanitary settings, which may lead to the presence of unsafe contaminants or other potentially harmful substances. Delta-8 THC products should be kept out of the reach of children and  pets.  MORE ABOUT CBD  General Information: CBD was discovered in 18 and it is a derivative of the cannabis sativa genus plants (Marijuana  and Hemp). It is one of the 113 identified substances found in Marijuana. It accounts for up to 40% of the plant's extract. As of 2018, preliminary clinical studies on CBD included research for the treatment of anxiety, movement disorders, and pain. CBD is available and consumed in multiple forms, including inhalation of smoke or vapor, as an aerosol spray, and by mouth. It may be supplied as an oil containing CBD, capsules, dried cannabis, or as a liquid solution. CBD is thought not to be as psychoactive as THC (delta-9-tetrahydrocannabinol - the chemical in marijuana responsible for the "HIGH"). Studies suggest that CBD may interact with different biological target receptors in the body, including cannabinoid and other neurotransmitter receptors. As of 2018 the mechanism of action for its biological effects has not been determined.  Side-effects  Adverse reactions: Dry mouth, diarrhea, decreased appetite, fatigue, drowsiness, malaise, weakness, sleep disturbances, and others.  Drug interactions: CBC may interact with other medications such as blood-thinners. Because CBD causes drowsiness on its own, it also increases the drowsiness caused by other medications, including antihistamines (such as Benadryl), benzodiazepines (Xanax, Ativan, Valium), antipsychotics, antidepressants and opioids, as well as alcohol and supplements such as kava, melatonin and St. John's Wort. Be cautious with the following combinations:   Brivaracetam (Briviact) Brivaracetam is changed and broken down by the body. CBD might decrease how quickly the body breaks down brivaracetam. This might increase levels of brivaracetam in the body.  Caffeine Caffeine is changed and broken down by the body. CBD might decrease how quickly the body breaks down caffeine. This might increase levels of  caffeine in the body.  Carbamazepine (Tegretol) Carbamazepine is changed and broken down by the body. CBD might decrease how quickly the body breaks down carbamazepine. This might increase levels of carbamazepine in the body and increase its side effects.  Citalopram (Celexa) Citalopram is changed and broken down by the body. CBD might decrease how quickly the body breaks down citalopram. This might increase levels of citalopram in the body and increase its side effects.  Clobazam (Onfi) Clobazam is changed and broken down by the liver. CBD might decrease how quickly the liver breaks down clobazam. This might increase the effects and side effects of clobazam.  Eslicarbazepine (Aptiom) Eslicarbazepine is changed and broken down by the body. CBD might decrease how quickly the body breaks down eslicarbazepine. This might increase levels of eslicarbazepine in the body by a small amount.  Everolimus (Zostress) Everolimus is changed and broken down by the body. CBD might decrease how quickly the body breaks down everolimus. This might increase levels of everolimus in the body.  Lithium Taking higher doses of CBD might increase levels of lithium. This can increase the risk of lithium toxicity.  Medications changed by the liver (Cytochrome P450 1A1 (CYP1A1) substrates) Some medications are changed and broken down by the liver. CBD might change how quickly the liver breaks down these medications. This could change the effects and side effects of these medications.  Medications changed by the liver (Cytochrome P450 1A2 (CYP1A2) substrates) Some medications are changed and broken down by the liver. CBD might change how quickly the liver breaks down these medications. This could change the effects and side effects of these medications.  Medications changed by the liver (Cytochrome P450 1B1 (CYP1B1) substrates) Some medications are changed and broken down by the liver. CBD might change how quickly the  liver breaks down these medications. This could change the effects and side effects of these medications.  Medications changed  by the liver (Cytochrome P450 2A6 (CYP2A6) substrates) Some medications are changed and broken down by the liver. CBD might change how quickly the liver breaks down these medications. This could change the effects and side effects of these medications.  Medications changed by the liver (Cytochrome P450 2B6 (CYP2B6) substrates) Some medications are changed and broken down by the liver. CBD might change how quickly the liver breaks down these medications. This could change the effects and side effects of these medications.  Medications changed by the liver (Cytochrome P450 2C19 (CYP2C19) substrates) Some medications are changed and broken down by the liver. CBD might change how quickly the liver breaks down these medications. This could change the effects and side effects of these medications.  Medications changed by the liver (Cytochrome P450 2C8 (CYP2C8) substrates) Some medications are changed and broken down by the liver. CBD might change how quickly the liver breaks down these medications. This could change the effects and side effects of these medications.  Medications changed by the liver (Cytochrome P450 2C9 (CYP2C9) substrates) Some medications are changed and broken down by the liver. CBD might change how quickly the liver breaks down these medications. This could change the effects and side effects of these medications.  Medications changed by the liver (Cytochrome P450 2D6 (CYP2D6) substrates) Some medications are changed and broken down by the liver. CBD might change how quickly the liver breaks down these medications. This could change the effects and side effects of these medications.  Medications changed by the liver (Cytochrome P450 2E1 (CYP2E1) substrates) Some medications are changed and broken down by the liver. CBD might change how quickly the liver  breaks down these medications. This could change the effects and side effects of these medications.  Medications changed by the liver (Cytochrome P450 3A4 (CYP3A4) substrates) Some medications are changed and broken down by the liver. CBD might change how quickly the liver breaks down these medications. This could change the effects and side effects of these medications.  Medications changed by the liver (Glucuronidated drugs) Some medications are changed and broken down by the liver. CBD might change how quickly the liver breaks down these medications. This could change the effects and side effects of these medications.  Medications that decrease the breakdown of other medications by the liver (Cytochrome P450 2C19 (CYP2C19) inhibitors) CBD is changed and broken down by the liver. Some drugs decrease how quickly the liver changes and breaks down CBD. This could change the effects and side effects of CBD.  Medications that decrease the breakdown of other medications in the liver (Cytochrome P450 3A4 (CYP3A4) inhibitors) CBD is changed and broken down by the liver. Some drugs decrease how quickly the liver changes and breaks down CBD. This could change the effects and side effects of CBD.  Medications that increase breakdown of other medications by the liver (Cytochrome P450 3A4 (CYP3A4) inducers) CBD is changed and broken down by the liver. Some drugs increase how quickly the liver changes and breaks down CBD. This could change the effects and side effects of CBD.  Medications that increase the breakdown of other medications by the liver (Cytochrome P450 2C19 (CYP2C19) inducers) CBD is changed and broken down by the liver. Some drugs increase how quickly the liver changes and breaks down CBD. This could change the effects and side effects of CBD.  Methadone (Dolophine) Methadone is broken down by the liver. CBD might decrease how quickly the liver breaks down methadone. Taking cannabidiol along  with methadone might  increase the effects and side effects of methadone.  Rufinamide (Banzel) Rufinamide is changed and broken down by the body. CBD might decrease how quickly the body breaks down rufinamide. This might increase levels of rufinamide in the body by a small amount.  Sedative medications (CNS depressants) CBD might cause sleepiness and slowed breathing. Some medications, called sedatives, can also cause sleepiness and slowed breathing. Taking CBD with sedative medications might cause breathing problems and/or too much sleepiness.  Sirolimus (Rapamune) Sirolimus is changed and broken down by the body. CBD might decrease how quickly the body breaks down sirolimus. This might increase levels of sirolimus in the body.  Stiripentol (Diacomit) Stiripentol is changed and broken down by the body. CBD might decrease how quickly the body breaks down stiripentol. This might increase levels of stiripentol in the body and increase its side effects.  Tacrolimus (Prograf) Tacrolimus is changed and broken down by the body. CBD might decrease how quickly the body breaks down tacrolimus. This might increase levels of tacrolimus in the body.  Tamoxifen (Soltamox) Tamoxifen is changed and broken down by the body. CBD might affect how quickly the body breaks down tamoxifen. This might affect levels of tamoxifen in the body.  Topiramate (Topamax) Topiramate is changed and broken down by the body. CBD might decrease how quickly the body breaks down topiramate. This might increase levels of topiramate in the body by a small amount.  Valproate Valproic acid can cause liver injury. Taking cannabidiol with valproic acid might increase the chance of liver injury. CBD and/or valproic acid might need to be stopped, or the dose might need to be reduced.  Warfarin (Coumadin) CBD might increase levels of warfarin, which can increase the risk for bleeding. CBD and/or warfarin might need to be stopped, or the  dose might need to be reduced.  Zonisamide Zonisamide is changed and broken down by the body. CBD might decrease how quickly the body breaks down zonisamide. This might increase levels of zonisamide in the body by a small amount. (Last update: 04/11/2021) ____________________________________________________________________________________________

## 2021-11-29 NOTE — Progress Notes (Signed)
Safety precautions to be maintained throughout the outpatient stay will include: orient to surroundings, keep bed in low position, maintain call bell within reach at all times, provide assistance with transfer out of bed and ambulation.   Nursing Pain Medication Assessment:  Safety precautions to be maintained throughout the outpatient stay will include: orient to surroundings, keep bed in low position, maintain call bell within reach at all times, provide assistance with transfer out of bed and ambulation.  Medication Inspection Compliance: Pill count conducted under aseptic conditions, in front of the patient. Neither the pills nor the bottle was removed from the patient's sight at any time. Once count was completed pills were immediately returned to the patient in their original bottle.  Medication: Oxycodone Pill/Patch Count:  19 of 30 pills remain Pill/Patch Appearance: Markings consistent with prescribed medication Bottle Appearance: Standard pharmacy container. Clearly labeled. Filled Date: 09 / 21 / 2023 Last Medication intake:  Yesterday

## 2021-11-29 NOTE — Progress Notes (Signed)
PROVIDER NOTE: Information contained herein reflects review and annotations entered in association with encounter. Interpretation of such information and data should be left to medically-trained personnel. Information provided to patient can be located elsewhere in the medical record under "Patient Instructions". Document created using STT-dictation technology, any transcriptional errors that may result from process are unintentional.    Patient: Charles Ewing  Service Category: E/M  Provider: Gaspar Cola, MD  DOB: [birth year]  DOS: 11/29/2021  Referring Provider: [name] *  MRN: [id number]  Specialty: Interventional Pain Management  PCP: Olin Hauser, DO  Type: Established Patient  Setting: Ambulatory outpatient    Location: Office  Delivery: Face-to-face     HPI  [name], a 81 y.o. year old male, is here today because of his Chronic pain syndrome [G89.4]. Mr. Sofia primary complain today is Back Pain Last encounter: My last encounter with him was on 11/28/2021. Pertinent problems: Mr. Kuang has Osteoarthritis of knees (Bilateral); Bilateral lower extremity edema; Chronic pain syndrome; Chronic hip pain (2ry area of Pain) (Right); Chronic lower extremity pain (3ry area of Pain) (Right); Chronic low back pain (1ry area of Pain) (Bilateral) (R>L) w/o sciatica; Lumbar facet syndrome; Dextroscoliosis of lumbar spine; DDD (degenerative disc disease), lumbosacral; Osteopenia of lumbar spine; Osteopenia determined by x-ray; Osteoarthritis of sacroiliac joints (HCC) (Bilateral); Osteoarthritis of hips (Bilateral); Chronic sacroiliac joint pain (Bilateral); Sacroiliac joint dysfunction (Bilateral); Spondylosis without myelopathy or radiculopathy, lumbosacral region; and Other spondylosis, sacral and sacrococcygeal region on their pertinent problem list. Pain Assessment: Severity of Chronic pain is reported as a 2 /10. Location: Back Lower/From lower right basck into  the hip and kee. Onset: More than a month ago. Quality: Aching, Constant. Timing: Constant. Modifying factor(s): Sit, rest, and medication. Vitals:  height is 5' 11.5" (1.816 m) and weight is 199 lb (90.3 kg). His temporal temperature is 97.4 F (36.3 C) (abnormal). His blood pressure is 137/79 and his pulse is 69. His respiration is 18 and oxygen saturation is 100%.   Reason for encounter: both, medication management and post-procedure evaluation and assessment.  The patient indicates having attained 100% relief of the pain for the duration of local anesthetic with an ongoing more than 50% improvement in his low back pain.  RTCB: 03/01/2022  Post-procedure evaluation   Type: Lumbar Facet, Medial Branch Radiofrequency Ablation (RFA) #1  Laterality: Right (-RT)  Level: L2, L3, L4, L5, & S1 Medial Branch Level(s). These levels will denervate the L3-4, L4-5 and L5-S1 lumbar facet joints.  Imaging: Fluoroscopy-guided         Anesthesia: Local anesthesia (1-2% Lidocaine) Anxiolysis: IV Versed 1.5 mg Sedation: Moderate Sedation  Fentanyl 25 mcg .  DOS: 10/18/2021  Performed by: Gaspar Cola, MD  Purpose: Therapeutic/Palliative Indications: Low back pain severe enough to impact quality of life or function. Indications: 1. Lumbar facet syndrome   2. Spondylosis without myelopathy or radiculopathy, lumbosacral region   3. DDD (degenerative disc disease), lumbosacral   4. Chronic low back pain (1ry area of Pain) (Bilateral) (R>L) w/o sciatica    Pain Score: Pre-procedure: 6 /10 Post-procedure: 0-No pain/10     Effectiveness:  Initial hour after procedure: 100 %. Subsequent 4-6 hours post-procedure: 100 %. Analgesia past initial 6 hours: 50 %. Ongoing improvement:  Analgesic: The patient refers having attained an ongoing 50, if not more relief of his pain.  He refers that he is now able to walk around the house without necessarily using the cane or the  walker.  He also refers being  able to go out on his own. Function: Mr. Nguyenthi reports improvement in function ROM: Mr. Mccoin reports improvement in ROM  Pharmacotherapy Assessment  Analgesic: Oxycodone IR 5 mg tablet, 1 tab p.o. 3 times daily (# 60) (last filled on 07/04/2021) MME/day: 22.5 mg/day   Monitoring: [place] PMP: PDMP not reviewed this encounter.       Pharmacotherapy: No side-effects or adverse reactions reported. Compliance: No problems identified. Effectiveness: Clinically acceptable.  Al Decant, RN  11/29/2021  2:14 PM  Sign when Signing Visit Safety precautions to be maintained throughout the outpatient stay will include: orient to surroundings, keep bed in low position, maintain call bell within reach at all times, provide assistance with transfer out of bed and ambulation.   Nursing Pain Medication Assessment:  Safety precautions to be maintained throughout the outpatient stay will include: orient to surroundings, keep bed in low position, maintain call bell within reach at all times, provide assistance with transfer out of bed and ambulation.  Medication Inspection Compliance: Pill count conducted under aseptic conditions, in front of the patient. Neither the pills nor the bottle was removed from the patient's sight at any time. Once count was completed pills were immediately returned to the patient in their original bottle.  Medication: Oxycodone Pill/Patch Count:  19 of 30 pills remain Pill/Patch Appearance: Markings consistent with prescribed medication Bottle Appearance: Standard pharmacy container. Clearly labeled. Filled Date: 09 / 21 / 2023 Last Medication intake:  Yesterday    No results found for: "CBDTHCR" No results found for: "D8THCCBX" No results found for: "D9THCCBX"  UDS:  Summary  Date Value Ref Range Status  06/14/2021 Note  Final    Comment:    ==================================================================== Compliance Drug Analysis,  Ur ==================================================================== Test                             Result       Flag       Units  Drug Present and Declared for Prescription Verification   Lorazepam                      200          EXPECTED   ng/mg creat    Source of lorazepam is a scheduled prescription medication.    Oxycodone                      1671         EXPECTED   ng/mg creat   Noroxycodone                   2522         EXPECTED   ng/mg creat    Sources of oxycodone include scheduled prescription medications.    Noroxycodone is an expected metabolite of oxycodone.    Gabapentin                     PRESENT      EXPECTED   Fluoxetine                     PRESENT      EXPECTED   Norfluoxetine                  PRESENT      EXPECTED    Norfluoxetine is an expected metabolite of fluoxetine.  Drug Absent but Declared for Prescription Verification   Salicylate                     Not Detected UNEXPECTED    Aspirin, as indicated in the declared medication list, is not always    detected even when used as directed.    Metoprolol                     Not Detected UNEXPECTED ==================================================================== Test                      Result    Flag   Units      Ref Range   Creatinine              41               mg/dL      >=20 ==================================================================== Declared Medications:  The flagging and interpretation on this report are based on the  following declared medications.  Unexpected results may arise from  inaccuracies in the declared medications.   **Note: The testing scope of this panel includes these medications:   Fluoxetine (Prozac)  Gabapentin (Neurontin)  Lorazepam (Ativan)  Metoprolol (Toprol)  Oxycodone (Roxicodone)   **Note: The testing scope of this panel does not include small to  moderate amounts of these reported medications:   Aspirin   **Note: The testing scope of this panel  does not include the  following reported medications:   Albuterol (Ventolin HFA)  Amlodipine (Norvasc)  Fluticasone (Flonase)  Furosemide (Lasix)  Losartan (Cozaar)  Meloxicam (Mobic)  Mupirocin (Bactroban)  Nystatin  Pantoprazole (Protonix)  Potassium Chloride  Simvastatin (Zocor)  Sucralfate (Carafate)  Tamsulosin (Flomax) ==================================================================== For clinical consultation, please call [phone]. ====================================================================       ROS  Constitutional: Denies any fever or chills Gastrointestinal: No reported hemesis, hematochezia, vomiting, or acute GI distress Musculoskeletal: Denies any acute onset joint swelling, redness, loss of ROM, or weakness Neurological: No reported episodes of acute onset apraxia, aphasia, dysarthria, agnosia, amnesia, paralysis, loss of coordination, or loss of consciousness  Medication Review  FLUoxetine, LORazepam, Tiotropium Bromide Monohydrate, albuterol, amLODipine, amoxicillin-clavulanate, aspirin EC, ciprofloxacin-dexamethasone, clotrimazole-betamethasone, cyanocobalamin, fluticasone, furosemide, gabapentin, losartan, meloxicam, naloxone, oxyCODONE, pantoprazole, potassium chloride, simvastatin, tamsulosin, and terbinafine  History Review  Allergy: Mr. Mcginley is allergic to nexium [esomeprazole magnesium]. Drug: Mr. Martelli  reports no history of drug use. Alcohol:  reports no history of alcohol use. Tobacco:  reports that he has been smoking cigarettes. He has a 70.00 pack-year smoking history. He has never used smokeless tobacco. Social: Mr. Helle  reports that he has been smoking cigarettes. He has a 70.00 pack-year smoking history. He has never used smokeless tobacco. He reports that he does not drink alcohol and does not use drugs. Medical:  has a past medical history of Abdominal aortic aneurysm (AAA) ([place]) (2013), Anxiety, Arrhythmia, Arthritis, Colon  polyp, COPD (chronic obstructive pulmonary disease) ([place]), Coronary artery disease, Depression, GERD (gastroesophageal reflux disease), Hyperlipidemia, Hypertension, Skin cancer, and Thrush. Surgical: Mr. Tuite  has a past surgical history that includes Skin cancer excision; Colon polyp surgery; Transurethral resection of prostate (2010); Cataract extraction w/PHACO (Left, 05/09/2020); and Cataract extraction w/PHACO (Right, 05/23/2020). Family: family history includes Kidney cancer in his mother.  Laboratory Chemistry Profile   Renal Lab Results  Component Value Date   BUN 18 06/13/2021   CREATININE 1.18 06/13/2021   BCR 15 06/13/2021   GFRAA  64 01/11/2019   GFRNONAA >60 12/23/2020    Hepatic Lab Results  Component Value Date   AST 13 06/13/2021   ALT 11 10/30/2020   ALBUMIN 3.9 06/13/2021   ALKPHOS 70 06/13/2021    Electrolytes Lab Results  Component Value Date   NA 138 06/13/2021   K 4.6 06/13/2021   CL 102 06/13/2021   CALCIUM 8.8 06/13/2021   MG 2.3 06/13/2021    Bone Lab Results  Component Value Date   25OHVITD1 14 (L) 06/13/2021   25OHVITD2 1.8 06/13/2021   25OHVITD3 12 06/13/2021    Inflammation (CRP: Acute Phase) (ESR: Chronic Phase) Lab Results  Component Value Date   CRP 1 06/13/2021   ESRSEDRATE 20 06/13/2021         Note: Above Lab results reviewed.  Recent Imaging Review  DG PAIN CLINIC C-ARM 1-60 MIN NO REPORT Fluoro was used, but no Radiologist interpretation will be provided.  Please refer to "NOTES" tab for provider progress note. Note: Reviewed        Physical Exam  General appearance: Well nourished, well developed, and well hydrated. In no apparent acute distress Mental status: Alert, oriented x 3 (person, place, & time)       Respiratory: No evidence of acute respiratory distress Eyes: PERLA Vitals: BP 137/79   Pulse 69   Temp (!) 97.4 F (36.3 C) (Temporal)   Resp 18   Ht 5' 11.5" (1.816 m)   Wt 199 lb (90.3 kg)   SpO2 100%   BMI  27.37 kg/m  BMI: Estimated body mass index is 27.37 kg/m as calculated from the following:   Height as of this encounter: 5' 11.5" (1.816 m).   Weight as of this encounter: 199 lb (90.3 kg). Ideal: Ideal body weight: 76.5 kg (168 lb 8.7 oz) Adjusted ideal body weight: 82 kg (180 lb 11.6 oz)  Assessment   Diagnosis Status  1. Chronic pain syndrome   2. Dextroscoliosis of lumbar spine   3. DDD (degenerative disc disease), lumbosacral   4. Osteoarthritis of sacroiliac joints (HCC) (Bilateral)   5. Osteoarthritis of hips (Bilateral)   6. Chronic low back pain (1ry area of Pain) (Bilateral) (R>L) w/o sciatica   7. Chronic hip pain (2ry area of Pain) (Right)   8. Chronic lower extremity pain (3ry area of Pain) (Right)   9. Lumbar facet syndrome (Right)   10. Pharmacologic therapy   11. Chronic use of opiate for therapeutic purpose   12. Encounter for medication management   13. Encounter for chronic pain management    Controlled Controlled Controlled   Updated Problems: No problems updated.   Plan of Care  Problem-specific:  No problem-specific Assessment & Plan notes found for this encounter.  Mr. HANNA AULTMAN has a current medication list which includes the following long-term medication(s): albuterol, amlodipine, fluoxetine, fluticasone, furosemide, gabapentin, losartan, naloxone, [START ON 12/01/2021] oxycodone, [START ON 12/31/2021] oxycodone, [START ON 01/30/2022] oxycodone, potassium chloride, simvastatin, and spiriva respimat.  Pharmacotherapy (Medications Ordered): Meds ordered this encounter  Medications   oxyCODONE (OXY IR/ROXICODONE) 5 MG immediate release tablet    Sig: Take 1 tablet (5 mg total) by mouth 2 (two) times daily as needed for severe pain. Each refill must last 30 days.    Dispense:  60 tablet    Refill:  0    DO NOT: delete (not duplicate); no partial-fill (will deny script to complete), no refill request (F/U required). DISPENSE: 1 day early if  closed on fill  date. WARN: No CNS-depressants within 8 hrs of med.   oxyCODONE (OXY IR/ROXICODONE) 5 MG immediate release tablet    Sig: Take 1 tablet (5 mg total) by mouth 2 (two) times daily as needed for severe pain. Each refill must last 30 days.    Dispense:  60 tablet    Refill:  0    DO NOT: delete (not duplicate); no partial-fill (will deny script to complete), no refill request (F/U required). DISPENSE: 1 day early if closed on fill date. WARN: No CNS-depressants within 8 hrs of med.   oxyCODONE (OXY IR/ROXICODONE) 5 MG immediate release tablet    Sig: Take 1 tablet (5 mg total) by mouth 2 (two) times daily as needed for severe pain. Each refill must last 30 days.    Dispense:  60 tablet    Refill:  0    DO NOT: delete (not duplicate); no partial-fill (will deny script to complete), no refill request (F/U required). DISPENSE: 1 day early if closed on fill date. WARN: No CNS-depressants within 8 hrs of med.   naloxone (NARCAN) nasal spray 4 mg/0.1 mL    Sig: Place 1 spray into the nose as needed for up to 365 doses (for opioid-induced respiratory depresssion). In case of emergency (overdose), spray once into each nostril. If no response within 3 minutes, repeat application and call [phone].    Dispense:  1 each    Refill:  0    Instruct patient in proper use of device.   Orders:  No orders of the defined types were placed in this encounter.  Follow-up plan:   Return in about 3 months (around 03/01/2022) for Eval-day (M,W), (F2F), (MM).     Interventional Therapies  Risk  Complexity Considerations:   Estimated body mass index is 28.29 kg/m as calculated from the following:   Height as of this encounter: 5' 10.5" (1.791 m).   Weight as of this encounter: 200 lb (90.7 kg). History of: abdominal aortic aneurysm (AAA); hypertension; cardiomyopathy; coronary artery disease; dissection of abdominal aorta; orthostatic hypotension; irregular heart rhythm; chronic kidney disease; shortness of  breath; tobacco abuse; COPD; GERD   Planned  Pending:      Under consideration:      Completed:   Therapeutic right lumbar facet RFA x1 (10/18/2021)  Diagnostic right lumbar facet MBB x2 (09/13/2021) (100/100/50/50)    Completed by other providers:   Diagnostic/therapeutic bilateral IA steroid knee inj. (03/15/2016) by Carlynn Spry, PA Rosanne Gutting)    Therapeutic  Palliative (PRN) options:   Therapeutic right lumbar facet MBB      Recent Visits Date Type Provider Dept  11/28/21 Office Visit Milinda Pointer, MD [facility]  10/18/21 Procedure visit Milinda Pointer, MD [facility]  09/27/21 Office Visit Milinda Pointer, MD [facility]  09/13/21 Procedure visit Milinda Pointer, MD [facility]  Showing recent visits within past 90 days and meeting all other requirements Today's Visits Date Type Provider Dept  11/29/21 Office Visit Milinda Pointer, MD [facility]  Showing today's visits and meeting all other requirements Future Appointments Date Type Provider Dept  02/27/22 Appointment Milinda Pointer, MD [facility]  Showing future appointments within next 90 days and meeting all other requirements  I discussed the assessment and treatment plan with the patient. The patient was provided an opportunity to ask questions and all were answered. The patient agreed with the plan and demonstrated an understanding of the instructions.  Patient advised to call back or  seek an in-person evaluation if the symptoms or condition worsens.  Duration of encounter: 30 minutes.  Total time on encounter, as per AMA guidelines included both the face-to-face and non-face-to-face time personally spent by the physician and/or other qualified health care professional(s) on the day of the encounter (includes time in activities that require the physician or other qualified health care professional and does not include time in  activities normally performed by clinical staff). Physician's time may include the following activities when performed: preparing to see the patient (eg, review of tests, pre-charting review of records) obtaining and/or reviewing separately obtained history performing a medically appropriate examination and/or evaluation counseling and educating the patient/family/caregiver ordering medications, tests, or procedures referring and communicating with other health care professionals (when not separately reported) documenting clinical information in the electronic or other health record independently interpreting results (not separately reported) and communicating results to the patient/ family/caregiver care coordination (not separately reported)  Note by: Gaspar Cola, MD Date: 11/29/2021; Time: 3:26 PM

## 2021-12-03 ENCOUNTER — Other Ambulatory Visit: Payer: Self-pay | Admitting: Family Medicine

## 2021-12-03 DIAGNOSIS — F5105 Insomnia due to other mental disorder: Secondary | ICD-10-CM

## 2021-12-03 DIAGNOSIS — F419 Anxiety disorder, unspecified: Secondary | ICD-10-CM

## 2021-12-04 NOTE — Telephone Encounter (Signed)
Requested medication (s) are due for refill today - yes  Requested medication (s) are on the active medication list -yes  Future visit scheduled -no  Last refill: 07/24/21 #30 2RF  Notes to clinic: non delegated Rx  Requested Prescriptions  Pending Prescriptions Disp Refills   LORazepam (ATIVAN) 0.5 MG tablet [Pharmacy Med Name: LORAZEPAM 0.5 MG TAB] 30 tablet     Sig: TAKE 1 TABLET BY MOUTH AT BEDTIME AS NEEDED FOR ANXIETY     Not Delegated - Psychiatry: Anxiolytics/Hypnotics 2 Failed - 12/03/2021  1:58 PM      Failed - This refill cannot be delegated      Failed - Urine Drug Screen completed in last 360 days      Passed - Patient is not pregnant      Passed - Valid encounter within last 6 months    Recent Outpatient Visits           1 week ago Tinea pedis of right foot   [place], DO   4 months ago Benign hypertension with CKD (chronic kidney disease) stage III [facility])   [facility], Devonne Doughty, DO   9 months ago Chronic right-sided low back pain with bilateral sciatica   [facility] Olin Hauser, DO   11 months ago Acute intractable headache, unspecified headache type   [place], Coralie Keens, NP   1 year ago Annual physical exam   [place], DO                 Requested Prescriptions  Pending Prescriptions Disp Refills   LORazepam (ATIVAN) 0.5 MG tablet [Pharmacy Med Name: LORAZEPAM 0.5 MG TAB] 30 tablet     Sig: TAKE 1 TABLET BY MOUTH AT BEDTIME AS NEEDED FOR ANXIETY     Not Delegated - Psychiatry: Anxiolytics/Hypnotics 2 Failed - 12/03/2021  1:58 PM      Failed - This refill cannot be delegated      Failed - Urine Drug Screen completed in last 360 days      Passed - Patient is not pregnant      Passed - Valid encounter within last 6 months    Recent Outpatient Visits           1 week ago  Tinea pedis of right foot   [place], DO   4 months ago Benign hypertension with CKD (chronic kidney disease) stage III [facility])   [facility], Devonne Doughty, DO   9 months ago Chronic right-sided low back pain with bilateral sciatica   [place], DO   11 months ago Acute intractable headache, unspecified headache type   [facility] [place], Coralie Keens, NP   1 year ago Annual physical exam   [place], Devonne Doughty, DO

## 2021-12-28 ENCOUNTER — Telehealth: Payer: Medicare Other

## 2021-12-28 ENCOUNTER — Telehealth: Payer: Self-pay | Admitting: Pharmacist

## 2021-12-28 NOTE — Telephone Encounter (Signed)
   Outreach Note  12/28/2021 Name: Charles Ewing MRN: [id number] DOB: [birth year]  Referred by: Olin Hauser, DO Reason for referral : No chief complaint on file.   Outreach to patient today regarding cholesterol medication adherence as requested by patient's health plan.  Was unable to reach patient via telephone again today and unable to leave a message as patient's voicemail is not setup.    Follow Up Plan: Will attempt to reach patient by telephone again within the next 14 days.  Wallace Cullens, PharmD, [place], CPP Clinical Pharmacist [facility] [place] [phone]

## 2022-01-02 DIAGNOSIS — Z720 Tobacco use: Secondary | ICD-10-CM | POA: Insufficient documentation

## 2022-01-07 ENCOUNTER — Telehealth: Payer: Medicare Other

## 2022-01-09 ENCOUNTER — Telehealth: Payer: Self-pay | Admitting: Pharmacist

## 2022-01-09 ENCOUNTER — Telehealth: Payer: Medicare Other

## 2022-01-09 NOTE — Telephone Encounter (Signed)
   Outreach Note  01/09/2022 Name: Charles Ewing MRN: [id number] DOB: 1940-07-04  Referred by: Olin Hauser, DO Reason for referral : No chief complaint on file.   Was unable to reach patient via telephone today and unable to leave a message as no voicemail is setup.   Follow Up Plan: Will attempt to reach patient by telephone again within the next 30 days  Wallace Cullens, PharmD, [facility] [phone]

## 2022-01-21 ENCOUNTER — Other Ambulatory Visit: Payer: Self-pay | Admitting: Family Medicine

## 2022-01-21 DIAGNOSIS — N401 Enlarged prostate with lower urinary tract symptoms: Secondary | ICD-10-CM

## 2022-01-21 DIAGNOSIS — R3911 Hesitancy of micturition: Secondary | ICD-10-CM

## 2022-01-21 DIAGNOSIS — R350 Frequency of micturition: Secondary | ICD-10-CM

## 2022-01-21 DIAGNOSIS — I129 Hypertensive chronic kidney disease with stage 1 through stage 4 chronic kidney disease, or unspecified chronic kidney disease: Secondary | ICD-10-CM

## 2022-01-22 NOTE — Telephone Encounter (Signed)
Requested medications are due for refill today.  yes  Requested medications are on the active medications list.  yes  Last refill. 10/30/2021 #90 0 rf  Future visit scheduled.   no  Notes to clinic.  Labs are expired.    Requested Prescriptions  Pending Prescriptions Disp Refills   tamsulosin (FLOMAX) 0.4 MG CAPS capsule [Pharmacy Med Name: TAMSULOSIN HCL 0.4 MG CAP] 90 capsule 0    Sig: TAKE 1 CAPSULE BY MOUTH DAILY AFTER BREAKFAST     Urology: Alpha-Adrenergic Blocker Failed - 01/21/2022 10:01 AM      Failed - PSA in normal range and within 360 days    PSA  Date Value Ref Range Status  10/30/2020 8.29 (H) < OR = 4.00 ng/mL Final    Comment:    The total PSA value from this assay system is  standardized against the WHO standard. The test  result will be approximately 20% lower when compared  to the equimolar-standardized total PSA (Beckman  Coulter). Comparison of serial PSA results should be  interpreted with this fact in mind. . This test was performed using the Siemens  chemiluminescent method. Values obtained from  different assay methods cannot be used interchangeably. PSA levels, regardless of value, should not be interpreted as absolute evidence of the presence or absence of disease.    Prostate Specific Ag, Serum  Date Value Ref Range Status  12/18/2015 5.6 (H) 0.0 - 4.0 ng/mL Final    Comment:    Roche ECLIA methodology. According to the American Urological Association, Serum PSA should decrease and remain at undetectable levels after radical prostatectomy. The AUA defines biochemical recurrence as an initial PSA value 0.2 ng/mL or greater followed by a subsequent confirmatory PSA value 0.2 ng/mL or greater. Values obtained with different assay methods or kits cannot be used interchangeably. Results cannot be interpreted as absolute evidence of the presence or absence of malignant disease.          Passed - Last BP in normal range    BP Readings from  Last 1 Encounters:  11/29/21 137/79         Passed - Valid encounter within last 12 months    Recent Outpatient Visits           1 month ago Tinea pedis of right foot   [place], DO   5 months ago Benign hypertension with CKD (chronic kidney disease) stage III [facility])   [facility], Devonne Doughty, DO   11 months ago Chronic right-sided low back pain with bilateral sciatica   [place], DO   1 year ago Acute intractable headache, unspecified headache type   [facility], Coralie Keens, NP   1 year ago Annual physical exam   [facility] Olin Hauser, DO              Signed Prescriptions Disp Refills   potassium chloride (MICRO-K) 10 MEQ CR capsule 45 capsule 1    Sig: TAKE 1 CAPSULE BY MOUTH EVERY OTHER DAY     Endocrinology:  Minerals - Potassium Supplementation Passed - 01/21/2022 10:01 AM      Passed - K in normal range and within 360 days    Potassium  Date Value Ref Range Status  06/13/2021 4.6 3.5 - 5.2 mmol/L Final  10/16/2013 3.8 3.5 - 5.1 mmol/L Final  Passed - Cr in normal range and within 360 days    Creat  Date Value Ref Range Status  10/30/2020 0.96 0.70 - 1.22 mg/dL Final   Creatinine, Ser  Date Value Ref Range Status  06/13/2021 1.18 0.76 - 1.27 mg/dL Final         Passed - Valid encounter within last 12 months    Recent Outpatient Visits           1 month ago Tinea pedis of right foot   [place], DO   5 months ago Benign hypertension with CKD (chronic kidney disease) stage III [facility])   [facility], Devonne Doughty, DO   11 months ago Chronic right-sided low back pain with bilateral sciatica   [place], DO   1 year ago Acute intractable headache, unspecified headache type    [facility] [place], Coralie Keens, NP   1 year ago Annual physical exam   [place], Devonne Doughty, DO

## 2022-01-22 NOTE — Telephone Encounter (Signed)
Requested Prescriptions  Pending Prescriptions Disp Refills   potassium chloride (MICRO-K) 10 MEQ CR capsule [Pharmacy Med Name: POTASSIUM CHLORIDE ER 10 MEQ CAP] 45 capsule 1    Sig: TAKE 1 CAPSULE BY MOUTH EVERY OTHER DAY     Endocrinology:  Minerals - Potassium Supplementation Passed - 01/21/2022 10:01 AM      Passed - K in normal range and within 360 days    Potassium  Date Value Ref Range Status  06/13/2021 4.6 3.5 - 5.2 mmol/L Final  10/16/2013 3.8 3.5 - 5.1 mmol/L Final         Passed - Cr in normal range and within 360 days    Creat  Date Value Ref Range Status  10/30/2020 0.96 0.70 - 1.22 mg/dL Final   Creatinine, Ser  Date Value Ref Range Status  06/13/2021 1.18 0.76 - 1.27 mg/dL Final         Passed - Valid encounter within last 12 months    Recent Outpatient Visits           1 month ago Tinea pedis of right foot   [place], DO   5 months ago Benign hypertension with CKD (chronic kidney disease) stage III [facility])   [facility], Devonne Doughty, DO   11 months ago Chronic right-sided low back pain with bilateral sciatica   [place], DO   1 year ago Acute intractable headache, unspecified headache type   [facility] [place], Coralie Keens, NP   1 year ago Annual physical exam   [place], DO               tamsulosin (FLOMAX) 0.4 MG CAPS capsule [Pharmacy Med Name: TAMSULOSIN HCL 0.4 MG CAP] 90 capsule 0    Sig: TAKE 1 CAPSULE BY MOUTH DAILY AFTER BREAKFAST     Urology: Alpha-Adrenergic Blocker Failed - 01/21/2022 10:01 AM      Failed - PSA in normal range and within 360 days    PSA  Date Value Ref Range Status  10/30/2020 8.29 (H) < OR = 4.00 ng/mL Final    Comment:    The total PSA value from this assay system is  standardized against the WHO standard. The test  result will be approximately  20% lower when compared  to the equimolar-standardized total PSA (Beckman  Coulter). Comparison of serial PSA results should be  interpreted with this fact in mind. . This test was performed using the Siemens  chemiluminescent method. Values obtained from  different assay methods cannot be used interchangeably. PSA levels, regardless of value, should not be interpreted as absolute evidence of the presence or absence of disease.    Prostate Specific Ag, Serum  Date Value Ref Range Status  12/18/2015 5.6 (H) 0.0 - 4.0 ng/mL Final    Comment:    Roche ECLIA methodology. According to the American Urological Association, Serum PSA should decrease and remain at undetectable levels after radical prostatectomy. The AUA defines biochemical recurrence as an initial PSA value 0.2 ng/mL or greater followed by a subsequent confirmatory PSA value 0.2 ng/mL or greater. Values obtained with different assay methods or kits cannot be used interchangeably. Results cannot be interpreted as absolute evidence of the presence or absence of malignant disease.          Passed - Last BP in normal range    BP Readings from Last  1 Encounters:  11/29/21 137/79         Passed - Valid encounter within last 12 months    Recent Outpatient Visits           1 month ago Tinea pedis of right foot   [place], DO   5 months ago Benign hypertension with CKD (chronic kidney disease) stage III [facility])   [facility] Olin Hauser, DO   11 months ago Chronic right-sided low back pain with bilateral sciatica   [place], DO   1 year ago Acute intractable headache, unspecified headache type   [facility] [place], Coralie Keens, NP   1 year ago Annual physical exam   [place], Devonne Doughty, DO

## 2022-01-28 ENCOUNTER — Other Ambulatory Visit: Payer: Self-pay | Admitting: Family Medicine

## 2022-01-28 DIAGNOSIS — R600 Localized edema: Secondary | ICD-10-CM

## 2022-01-28 DIAGNOSIS — I10 Essential (primary) hypertension: Secondary | ICD-10-CM

## 2022-01-28 DIAGNOSIS — R809 Proteinuria, unspecified: Secondary | ICD-10-CM

## 2022-01-28 DIAGNOSIS — N183 Chronic kidney disease, stage 3 unspecified: Secondary | ICD-10-CM

## 2022-01-29 NOTE — Telephone Encounter (Signed)
Requested Prescriptions  Pending Prescriptions Disp Refills   losartan (COZAAR) 25 MG tablet [Pharmacy Med Name: LOSARTAN POTASSIUM 25 MG TAB] 90 tablet 0    Sig: TAKE 1 TABLET BY MOUTH DAILY     Cardiovascular:  Angiotensin Receptor Blockers Failed - 01/28/2022 11:05 AM      Failed - Cr in normal range and within 180 days    Creat  Date Value Ref Range Status  10/30/2020 0.96 0.70 - 1.22 mg/dL Final   Creatinine, Ser  Date Value Ref Range Status  06/13/2021 1.18 0.76 - 1.27 mg/dL Final         Failed - K in normal range and within 180 days    Potassium  Date Value Ref Range Status  06/13/2021 4.6 3.5 - 5.2 mmol/L Final  10/16/2013 3.8 3.5 - 5.1 mmol/L Final         Passed - Patient is not pregnant      Passed - Last BP in normal range    BP Readings from Last 1 Encounters:  11/29/21 137/79         Passed - Valid encounter within last 6 months    Recent Outpatient Visits           2 months ago Tinea pedis of right foot   [place], DO   5 months ago Benign hypertension with CKD (chronic kidney disease) stage III ([place])   [facility] Olin Hauser, DO   11 months ago Chronic right-sided low back pain with bilateral sciatica   [place], DO   1 year ago Acute intractable headache, unspecified headache type   [place], Coralie Keens, NP   1 year ago Annual physical exam   [place], DO               furosemide (LASIX) 20 MG tablet [Pharmacy Med Name: FUROSEMIDE 20 MG TAB] 45 tablet 1    Sig: TAKE 1 TABLET BY MOUTH EVERY OTHER DAY     Cardiovascular:  Diuretics - Loop Failed - 01/28/2022 11:05 AM      Failed - K in normal range and within 180 days    Potassium  Date Value Ref Range Status  06/13/2021 4.6 3.5 - 5.2 mmol/L Final  10/16/2013 3.8 3.5 - 5.1 mmol/L Final         Failed - Ca  in normal range and within 180 days    Calcium  Date Value Ref Range Status  06/13/2021 8.8 8.6 - 10.2 mg/dL Final   Calcium, Total  Date Value Ref Range Status  10/16/2013 8.0 (L) 8.5 - 10.1 mg/dL Final         Failed - Na in normal range and within 180 days    Sodium  Date Value Ref Range Status  06/13/2021 138 134 - 144 mmol/L Final  10/16/2013 141 136 - 145 mmol/L Final         Failed - Cr in normal range and within 180 days    Creat  Date Value Ref Range Status  10/30/2020 0.96 0.70 - 1.22 mg/dL Final   Creatinine, Ser  Date Value Ref Range Status  06/13/2021 1.18 0.76 - 1.27 mg/dL Final         Failed - Cl in normal range and within 180 days    Chloride  Date Value Ref Range Status  06/13/2021 102  96 - 106 mmol/L Final  10/16/2013 107 98 - 107 mmol/L Final         Failed - Mg Level in normal range and within 180 days    Magnesium  Date Value Ref Range Status  06/13/2021 2.3 1.6 - 2.3 mg/dL Final  04/22/2012 1.8 mg/dL Final    Comment:    1.8-2.4 THERAPEUTIC RANGE: 4-7 mg/dL TOXIC: > 10 mg/dL  -----------------------          Passed - Last BP in normal range    BP Readings from Last 1 Encounters:  11/29/21 137/79         Passed - Valid encounter within last 6 months    Recent Outpatient Visits           2 months ago Tinea pedis of right foot   [place], DO   5 months ago Benign hypertension with CKD (chronic kidney disease) stage III [facility])   [facility], Devonne Doughty, DO   11 months ago Chronic right-sided low back pain with bilateral sciatica   [place], DO   1 year ago Acute intractable headache, unspecified headache type   [facility], Coralie Keens, NP   1 year ago Annual physical exam   [place], Devonne Doughty, DO

## 2022-02-01 ENCOUNTER — Telehealth: Payer: Self-pay | Admitting: Pharmacist

## 2022-02-01 ENCOUNTER — Telehealth: Payer: Medicare Other

## 2022-02-01 NOTE — Telephone Encounter (Signed)
   Outreach Note  01/09/2022 Name: Charles Ewing MRN: [id number] DOB: 1940/03/08   Outreach to patient today regarding cholesterol medication adherence as requested by patient's health plan.   Was again unable to reach patient via telephone today and unable to leave a message as no voicemail is setup.   Wallace Cullens, PharmD, [place] [phone]

## 2022-02-12 ENCOUNTER — Other Ambulatory Visit: Payer: Self-pay | Admitting: Family Medicine

## 2022-02-12 DIAGNOSIS — G8929 Other chronic pain: Secondary | ICD-10-CM

## 2022-02-13 NOTE — Telephone Encounter (Signed)
Requested Prescriptions  Pending Prescriptions Disp Refills   meloxicam (MOBIC) 15 MG tablet [Pharmacy Med Name: MELOXICAM 15 MG TAB] 90 tablet 0    Sig: TAKE 1 TABLET BY MOUTH DAILY AS NEEDED FOR PAIN     Analgesics:  COX2 Inhibitors Failed - 02/12/2022  2:21 PM      Failed - Manual Review: Labs are only required if the patient has taken medication for more than 8 weeks.      Failed - HGB in normal range and within 360 days    Hemoglobin  Date Value Ref Range Status  12/23/2020 13.5 13.0 - 17.0 g/dL Final  05/19/2015 14.6 12.6 - 17.7 g/dL Final         Failed - HCT in normal range and within 360 days    HCT  Date Value Ref Range Status  12/23/2020 40.6 39.0 - 52.0 % Final   Hematocrit  Date Value Ref Range Status  05/19/2015 44.5 37.5 - 51.0 % Final         Failed - ALT in normal range and within 360 days    ALT  Date Value Ref Range Status  10/30/2020 11 9 - 46 U/L Final   SGPT (ALT)  Date Value Ref Range Status  10/15/2013 27 U/L Final    Comment:    14-63 NOTE: New Reference Range 08/31/13          Passed - Cr in normal range and within 360 days    Creat  Date Value Ref Range Status  10/30/2020 0.96 0.70 - 1.22 mg/dL Final   Creatinine, Ser  Date Value Ref Range Status  06/13/2021 1.18 0.76 - 1.27 mg/dL Final         Passed - AST in normal range and within 360 days    AST  Date Value Ref Range Status  06/13/2021 13 0 - 40 IU/L Final   SGOT(AST)  Date Value Ref Range Status  10/15/2013 15 15 - 37 Unit/L Final         Passed - eGFR is 30 or above and within 360 days    GFR, Est African American  Date Value Ref Range Status  01/11/2019 64 > OR = 60 mL/min/1.49m Final   GFR, Est Non African American  Date Value Ref Range Status  01/11/2019 55 (L) > OR = 60 mL/min/1.740mFinal   GFR, Estimated  Date Value Ref Range Status  12/23/2020 >60 >60 mL/min Final    Comment:    (NOTE) Calculated using the CKD-EPI Creatinine Equation (2021)    eGFR   Date Value Ref Range Status  06/13/2021 62 >59 mL/min/1.73 Final         Passed - Patient is not pregnant      Passed - Valid encounter within last 12 months    Recent Outpatient Visits           2 months ago [facility] ped[facility] of right foot   [facility]   6 months ago [facility]ign [facility] with CKD [facility]ronic kidney [facility]  [facility]   12 months ago Chronic right-sided low back pain with bilateral sciatica   [place]   1 year ago Acute intractable headache, unspecified headache type   [facility]   1 year ago Annual physical exam   [facility]

## 2022-02-25 NOTE — Patient Instructions (Addendum)
______________________________________________________________________  Preparing for your procedure  During your procedure appointment there will be: No Prescription Refills. No disability issues to discussed. No medication changes or discussions.  Instructions: Food intake: Avoid eating anything solid for at least 8 hours prior to your procedure. Clear liquid intake: You may take clear liquids such as water up to 2 hours prior to your procedure. (No carbonated drinks. No soda.) Transportation: Unless otherwise stated by your physician, bring a driver. Morning Medicines: Except for blood thinners, take all of your other morning medications with a sip of water. Make sure to take your heart and blood pressure medicines. If your blood pressure's lower number is above 100, the case will be rescheduled. Blood thinners: Make sure to stop your blood thinners as instructed.  If you take a blood thinner, but were not instructed to stop it, call our office [phone] and ask to talk to a nurse. Not stopping a blood thinner prior to certain procedures could lead to serious complications. Diabetics on insulin: Notify the staff so that you can be scheduled 1st case in the morning. If your diabetes requires high dose insulin, take only  of your normal insulin dose the morning of the procedure and notify the staff that you have done so. Preventing infections: Shower with an antibacterial soap the morning of your procedure.  Build-up your immune system: Take 1000 mg of Vitamin C with every meal (3 times a day) the day prior to your procedure. Antibiotics: Inform the nursing staff if you are taking any antibiotics or if you have any conditions that may require antibiotics prior to procedures. (Example: recent joint implants)   Pregnancy: If you are pregnant make sure to notify the nursing staff. Not doing so may result in injury to the fetus, including death.  Sickness: If you have a cold, fever, or any  active infections, call and cancel or reschedule your procedure. Receiving steroids while having an infection may result in complications. Arrival: You must be in the facility at least 30 minutes prior to your scheduled procedure. Tardiness: Your scheduled time is also the cutoff time. If you do not arrive at least 15 minutes prior to your procedure, you will be rescheduled.  Children: Do not bring any children with you. Make arrangements to keep them home. Dress appropriately: There is always a possibility that your clothing may get soiled. Avoid long dresses. Valuables: Do not bring any jewelry or valuables.  Reasons to call and reschedule or cancel your procedure: (Following these recommendations will minimize the risk of a serious complication.) Surgeries: Avoid having procedures within 2 weeks of any surgery. (Avoid for 2 weeks before or after any surgery). Flu Shots: Avoid having procedures within 2 weeks of a flu shots or . (Avoid for 2 weeks before or after immunizations). Barium: Avoid having a procedure within 7-10 days after having had a radiological study involving the use of radiological contrast. (Myelograms, Barium swallow or enema study). Heart attacks: Avoid any elective procedures or surgeries for the initial 6 months after a "Myocardial Infarction" (Heart Attack). Blood thinners: It is imperative that you stop these medications before procedures. Let us know if you if you take any blood thinner.  Infection: Avoid procedures during or within two weeks of an infection (including chest colds or gastrointestinal problems). Symptoms associated with infections include: Localized redness, fever, chills, night sweats or profuse sweating, burning sensation when voiding, cough, congestion, stuffiness, runny nose, sore throat, diarrhea, nausea, vomiting, cold or Flu symptoms, recent or   current infections. It is specially important if the infection is over the area that we intend to treat. Heart  and lung problems: Symptoms that may suggest an active cardiopulmonary problem include: cough, chest pain, breathing difficulties or shortness of breath, dizziness, ankle swelling, uncontrolled high or unusually low blood pressure, and/or palpitations. If you are experiencing any of these symptoms, cancel your procedure and contact your primary care physician for an evaluation.  Remember:  Regular Business hours are:  Monday to Thursday 8:00 AM to 4:00 PM  Provider's Schedule: Milinda Pointer, MD:  Procedure days: Tuesday and Thursday 7:30 AM to 4:00 PM  Gillis Santa, MD:  Procedure days: Monday and Wednesday 7:30 AM to 4:00 PM  ______________________________________________________________________    ____________________________________________________________________________________________  General Risks and Possible Complications  Patient Responsibilities: It is important that you read this as it is part of your informed consent. It is our duty to inform you of the risks and possible complications associated with treatments offered to you. It is your responsibility as a patient to read this and to ask questions about anything that is not clear or that you believe was not covered in this document.  Patient's Rights: You have the right to refuse treatment. You also have the right to change your mind, even after initially having agreed to have the treatment done. However, under this last option, if you wait until the last second to change your mind, you may be charged for the materials used up to that point.  Introduction: Medicine is not an Chief Strategy Officer. Everything in Medicine, including the lack of treatment(s), carries the potential for danger, harm, or loss (which is by definition: Risk). In Medicine, a complication is a secondary problem, condition, or disease that can aggravate an already existing one. All treatments carry the risk of possible complications. The fact that a side  effects or complications occurs, does not imply that the treatment was conducted incorrectly. It must be clearly understood that these can happen even when everything is done following the highest safety standards.  No treatment: You can choose not to proceed with the proposed treatment alternative. The "PRO(s)" would include: avoiding the risk of complications associated with the therapy. The "CON(s)" would include: not getting any of the treatment benefits. These benefits fall under one of three categories: diagnostic; therapeutic; and/or palliative. Diagnostic benefits include: getting information which can ultimately lead to improvement of the disease or symptom(s). Therapeutic benefits are those associated with the successful treatment of the disease. Finally, palliative benefits are those related to the decrease of the primary symptoms, without necessarily curing the condition (example: decreasing the pain from a flare-up of a chronic condition, such as incurable terminal cancer).  General Risks and Complications: These are associated to most interventional treatments. They can occur alone, or in combination. They fall under one of the following six (6) categories: no benefit or worsening of symptoms; bleeding; infection; nerve damage; allergic reactions; and/or death. No benefits or worsening of symptoms: In Medicine there are no guarantees, only probabilities. No healthcare provider can ever guarantee that a medical treatment will work, they can only state the probability that it may. Furthermore, there is always the possibility that the condition may worsen, either directly, or indirectly, as a consequence of the treatment. Bleeding: This is more common if the patient is taking a blood thinner, either prescription or over the counter (example: Goody Powders, Fish oil, Aspirin, Garlic, etc.), or if suffering a condition associated with impaired coagulation (example: Hemophilia, cirrhosis  of the liver,  low platelet counts, etc.). However, even if you do not have one on these, it can still happen. If you have any of these conditions, or take one of these drugs, make sure to notify your treating physician. Infection: This is more common in patients with a compromised immune system, either due to disease (example: diabetes, cancer, human immunodeficiency virus [HIV], etc.), or due to medications or treatments (example: therapies used to treat cancer and rheumatological diseases). However, even if you do not have one on these, it can still happen. If you have any of these conditions, or take one of these drugs, make sure to notify your treating physician. Nerve Damage: This is more common when the treatment is an invasive one, but it can also happen with the use of medications, such as those used in the treatment of cancer. The damage can occur to small secondary nerves, or to large primary ones, such as those in the spinal cord and brain. This damage may be temporary or permanent and it may lead to impairments that can range from temporary numbness to permanent paralysis and/or brain death. Allergic Reactions: Any time a substance or material comes in contact with our body, there is the possibility of an allergic reaction. These can range from a mild skin rash (contact dermatitis) to a severe systemic reaction (anaphylactic reaction), which can result in death. Death: In general, any medical intervention can result in death, most of the time due to an unforeseen complication. ____________________________________________________________________________________________    ____________________________________________________________________________________________  Patient Information update  To: All of our patients.  Re: Name change.  It has been made official that our current name, "[facility]"   will soon be changed to "[facility]".   The purpose of this change is to eliminate any confusion created by the concept of our practice being a "Medication Management Pain Clinic". In the past this has led to the misconception that we treat pain primarily by the use of prescription medications.  Nothing can be farther from the truth.   Understanding PAIN MANAGEMENT: To further understand what our practice does, you first have to understand that "Pain Management" is a subspecialty that requires additional training once a physician has completed their specialty training, which can be in either Anesthesia, Neurology, Psychiatry, or Physical Medicine and Rehabilitation (PMR). Each one of these contributes to the final approach taken by each physician to the management of their patient's pain. To be a "Pain Management Specialist" you must have first completed one of the specialty trainings below.  Anesthesiologists - trained in clinical pharmacology and interventional techniques such as nerve blockade and regional as well as central neuroanatomy. They are trained to block pain before, during, and after surgical interventions.  Neurologists - trained in the diagnosis and pharmacological treatment of complex neurological conditions, such as Multiple Sclerosis, Parkinson's, spinal cord injuries, and other systemic conditions that may be associated with symptoms that may include but are not limited to pain. They tend to rely primarily on the treatment of chronic pain using prescription medications.  Psychiatrist - trained in conditions affecting the psychosocial wellbeing of patients including but not limited to depression, anxiety, schizophrenia, personality disorders, addiction, and other substance use disorders that may be associated with chronic pain. They tend to rely primarily on the treatment of chronic pain using prescription medications.   Physical Medicine and Rehabilitation (PMR) physicians,  also known as physiatrists -  trained to treat a wide variety of medical conditions affecting the brain, spinal cord, nerves, bones, joints, ligaments, muscles, and tendons. Their training is primarily aimed at treating patients that have suffered injuries that have caused severe physical impairment. Their training is primarily aimed at the physical therapy and rehabilitation of those patients. They may also work alongside orthopedic surgeons or neurosurgeons using their expertise in assisting surgical patients to recover after their surgeries.  INTERVENTIONAL PAIN MANAGEMENT is sub-subspecialty of Pain Management.  Our physicians are Board-certified in Anesthesia, Pain Management, and Interventional Pain Management.  This meaning that not only have they been trained and Board-certified in their specialty of Anesthesia, and subspecialty of Pain Management, but they have also received further training in the sub-subspecialty of Interventional Pain Management, in order to become Board-certified as INTERVENTIONAL PAIN MANAGEMENT SPECIALIST.    Mission: Our goal is to use our skills in  [place] as alternatives to the chronic use of prescription opioid medications for the treatment of pain. To make this more clear, we have changed our name to reflect what we do and offer. We will continue to offer medication management assessment and recommendations, but we will not be taking over any patient's medication management.  ____________________________________________________________________________________________    ____________________________________________________________________________________________  Opioid Pain Medication Update  To: All patients taking opioid pain medications. (I.e.: hydrocodone, hydromorphone, oxycodone, oxymorphone, morphine, codeine, methadone, tapentadol, tramadol, buprenorphine, fentanyl, etc.)  Re: Review of side effects and adverse reactions of opioid  analgesics, as well as new information about long term effects of this class of medications.  Direct risks of long-term opioid therapy are not limited to opioid addiction and overdose. Potential medical risks include serious fractures, breathing problems during sleep, hyperalgesia, immunosuppression, chronic constipation, bowel obstruction, myocardial infarction, and tooth decay secondary to xerostomia.  Historically, the original case for using long-term opioid therapy to treat chronic noncancer pain was based on safety assumptions that subsequent experience has called into question. In 1996, the American Pain Society and the [place] [employer] issued a consensus statement supporting long-term opioid therapy. This statement acknowledged the dangers of opioid prescribing but concluded that the risk for addiction was low; respiratory depression induced by opioids was short-lived, occurred mainly in opioid-naive patients, and was antagonized by pain; tolerance was not a common problem; and efforts to control diversion should not constrain opioid prescribing. This has now proven to be wrong. Unfortunately, experience regarding the risks for opioid addiction, misuse, and overdose in community practice has failed to support these assumptions.  According to the Centers for Disease Control and Prevention, fatal overdoses involving opioid analgesics have increased sharply over the past decade. Currently, more than 96,700 people die from drug overdoses every year. Opioids are a factor in 7 out of every 10 overdose deaths. Deaths from drug overdose have surpassed motor vehicle accidents as the leading cause of death for individuals between the ages of 34 and 50.  Clinical data suggest that neuroendocrine dysfunction may be very common in both men and women, potentially causing hypogonadism, erectile dysfunction, infertility, decreased libido, osteoporosis, and depression. Recent studies linked higher  opioid dose to increased opioid-related mortality. Controlled observational studies reported that long-term opioid therapy may be associated with increased risk for cardiovascular events. Subsequent meta-analysis concluded that the safety of long-term opioid therapy in elderly patients has not been proven.   Side Effects and adverse reactions: Common side effects: Drowsiness (sedation). Dizziness. Nausea and vomiting. Constipation. Physical dependence -- Dependence often manifests with withdrawal  symptoms when opioids are discontinued or decreased. Tolerance -- As you take repeated doses of opioids, you require increased medication to experience the same effect of pain relief. Respiratory depression -- This can occur in healthy people, especially with higher doses. However, people with COPD, asthma or other lung conditions may be even more susceptible to fatal respiratory impairment.  Uncommon side effects: An increased sensitivity to feeling pain and extreme response to pain (hyperalgesia). Chronic use of opioids can lead to this. Delayed gastric emptying (the process by which the contents of your stomach are moved into your small intestine). Muscle rigidity. Immune system and hormonal dysfunction. Quick, involuntary muscle jerks (myoclonus). Arrhythmia. Itchy skin (pruritus). Dry mouth (xerostomia).  Long-term side effects: Chronic constipation. Sleep-disordered breathing (SDB). Increased risk of bone fractures. Hypothalamic-pituitary-adrenal dysregulation. Increased risk of overdose.  RISKS: Fractures and Falls:  Opioids increase the risk and incidence of falls. This is of particular importance in elderly patients.  Endocrine System:  Long-term administration is associated with endocrine abnormalities. Influences on both the hypothalamic-pituitary-adrenal axis?and the hypothalamic-pituitary-gonadal axis have been demonstrated with consequent hypogonadism and adrenal  insufficiency in both sexes. Hypogonadism and decreased levels of dehydroepiandrosterone sulfate have been reported in men and women. Endocrine effects can lead to: Amenorrhoea in women Reduced libido in both sexes Erectile dysfunction in men Infertility Depression and fatigue Patients (particularly women of childbearing age) should avoid opioids. There is insufficient evidence to recommend routine monitoring of asymptomatic patients taking opioids in the long-term for hormonal deficiencies.  Immune System: Human studies have demonstrated that opioids have an immunomodulating effect. These effects are mediated via opioid receptors both on immune effector cells and in the central nervous system. Opioids have been demonstrated to have adverse effects on antimicrobial response and anti-tumour surveillance. Buprenorphine has been demonstrated to have no impact on immune function.  Opioid Induced Hyperalgesia: Human studies have demonstrated that prolonged use of opioids can lead to a state of abnormal pain sensitivity, sometimes called opioid induced hyperalgesia (OIH). Opioid induced hyperalgesia is not usually seen in the absence of tolerance to opioid analgesia. Clinically, hyperalgesia may be diagnosed if the patient on long-term opioid therapy presents with increased pain. This might be qualitatively and anatomically distinct from pain related to disease progression or to breakthrough pain resulting from development of opioid tolerance. Pain associated with hyperalgesia tends to be more diffuse than the pre-existing pain and less defined in quality. Management of opioid induced hyperalgesia requires opioid dose reduction.  Cancer: Chronic opioid therapy has been associated with an increased risk of cancer among noncancer patients with chronic pain. This association was more evident in chronic strong opioid users. Chronic opioid consumption causes significant pathological changes in the small  intestine and colon. Epidemiological studies have found that there is a link between opium dependence and initiation of gastrointestinal cancers. Cancer is the second leading cause of death after cardiovascular disease. Chronic use of opioids can cause multiple conditions such as GERD, immunosuppression and renal damage as well as carcinogenic effects, which are associated with the incidence of cancers.   Mortality: Long-term opioid use has been associated with increased mortality among patients with chronic non-cancer pain (CNCP).  Prescription of long-acting opioids for chronic noncancer pain was associated with a significantly increased risk of all-cause mortality, including deaths from causes other than overdose.  Reference: Von Korff M, Kolodny A, Deyo RA, Chou R. Long-term opioid therapy reconsidered. Ann Intern Med. 2011 Sep 6;155(5):325-8. doi: 10.7326/[id number]. PMID: 01093235; PMCID: [id number]. Beverely Pace, [place]  Tobin Chad RA, Dunn KM, [place] KP. Risk of adverse events in patients prescribed long-term opioids: A cohort study in the [place] Clinical Practice Research [employer]. Eur J Pain. 2019 May;23(5):908-922. doi: 10.1002/ejp.1357. Epub 2019 Jan 31. PMID: 73220254. Colameco S, Coren JS, Ciervo CA. Continuous opioid treatment for chronic noncancer pain: a time for moderation in prescribing. Postgrad Med. 2009 Jul;121(4):61-6. doi: 10.3810/pgm.2009.07.2032. PMID: 27062376. Heywood Bene RN, [place] SD, Blazina I, Rosalio Loud, Bougatsos C, Deyo RA. The effectiveness and risks of long-term opioid therapy for chronic pain: a systematic review for a [employer] of Health Pathways to [employer]. Ann Intern Med. 2015 Feb 17;162(4):276-86. doi: 28.3151/V61-6073. PMID: 71062694. [facility], [place] DM. NCHS Data Brief No. 22. [place]: Centers for Disease Control and Prevention; 2009. Sep, Increase in Fatal Poisonings Involving Opioid  Analgesics in the [place], 1999-2006. Song IA, Choi HR, Oh TK. Long-term opioid use and mortality in patients with chronic non-cancer pain: Ten-year follow-up study in [place] from 2010 through 2019. EClinicalMedicine. 2022 Jul 18;51:101558. doi: 10.1016/j.eclinm.[id number]. PMID: 03500938; PMCID: [id number]. Huser, W., Schubert, T., Vogelmann, T. et al. All-cause mortality in patients with long-term opioid therapy compared with non-opioid analgesics for chronic non-cancer pain: a database study. [place] Med 18, 162 (2020). [email or web address] Rashidian H, Roxy Cedar, Malekzadeh R, Haghdoost AA. An Ecological Study of the Association between Opiate Use and Incidence of Cancers. Addict Health. 2016 Fall;8(4):252-260. PMID: 67893810; PMCID: [id number].  Our Goals and Recommendations: Our goal is to control your pain with means other than the use of opioid pain medications. Talk to your physician about coming off of these medications. We can assist you with the tapering down and stopping these medicines. Based on the information above, even if you cannot completely stop these medicines, even a decrease in the dose has been shown to be associated with a decreased risk.  ____________________________________________________________________________________________     ____________________________________________________________________________________________  National Pain Medication Shortage  The [place] is experiencing worsening drug shortages. These have had a negative widespread effect on patient care and treatment. Not expected to improve any time soon. Predicted to last past 2029.   Drug shortage list (generic names) Oxycodone IR Oxycodone/APAP Oxymorphone IR Hydromorphone Hydrocodone/APAP Morphine  Where is the problem?  Manufacturing and supply level.  Will this shortage affect you?  Only if you take any of the above pain  medications.  How? You may be unable to fill your prescription.  Your pharmacist may offer a "partial fill" of your prescription. (Warning: Do not accept partial fills.) Prescriptions partially filled cannot be transferred to another pharmacy. Read our Medication Rules and Regulation. Depending on how much medicine you are dependent on, you may experience withdrawals when unable to get the medication.  Recommendations: Consider ending your dependence on opioid pain medications. Ask your pain specialist to assist you with the process. Consider switching to a medication currently not in shortage, such as Buprenorphine. Talk to your pain specialist about this option. Consider decreasing your pain medication requirements by managing tolerance thru "Drug Holidays". This may help minimize withdrawals, should you run out of medicine. Control your pain thru the use of non-pharmacological interventional therapies.   Your prescriber: Prescribers cannot be blamed for shortages. Medication manufacturing and supply issues cannot be fixed by the prescriber.   NOTE: The prescriber is not responsible for supplying the medication, or solving supply issues. Work with your pharmacist to solve it. The patient is responsible for the decision to take or  continue taking the medication and for identifying and securing a legal supply source. By law, supplying the medication is the job and responsibility of the pharmacy. The prescriber is responsible for the evaluation, monitoring, and prescribing of these medications.   Prescribers will NOT: Re-issue prescriptions that have been partially filled. Re-issue prescriptions already sent to a pharmacy.  Re-send prescriptions to a different pharmacy because yours did not have your medication. Ask pharmacist to order more medicine or transfer the prescription to another pharmacy. (Read below.)  New 2023 regulation: "October 12, 2021 Revised Regulation Allows  DEA-Registered Pharmacies to Transfer Electronic Prescriptions at a Patient's Request [place] Patients now have the ability to request their electronic prescription be transferred to another pharmacy without having to go back to their practitioner to initiate the request. This revised regulation went into effect on Monday, October 08, 2021.     At a patient's request, a DEA-registered retail pharmacy can now transfer an electronic prescription for a controlled substance (schedules II-V) to another DEA-registered retail pharmacy. Prior to this change, patients would have to go through their practitioner to cancel their prescription and have it re-issued to a different pharmacy. The process was taxing and time consuming for both patients and practitioners.    The Drug Enforcement Administration [facility]) published its intent to revise the process for transferring electronic prescriptions on December 31, 2019.  The final rule was published in the federal register on September 06, 2021 and went into effect 30 days later.  Under the final rule, a prescription can only be transferred once between pharmacies, and only if allowed under existing state or other applicable law. The prescription must remain in its electronic form; may not be altered in any way; and the transfer must be communicated directly between two licensed pharmacists. It's important to note, any authorized refills transfer with the original prescription, which means the entire prescription will be filled at the same pharmacy".  Reference: [email or web address] [facility] website announcement)  [email or web address] ([place])   [employer] / Vol. 88, No. 143 / Thursday, September 06, 2021 / Rules and Regulations DEPARTMENT OF JUSTICE  Drug  Enforcement Administration  21 CFR Part 1306  [Docket No. [id number]]  RIN [id number] Transfer of Electronic Prescriptions for Schedules II-V Controlled Substances Between Pharmacies for Initial Filling  ____________________________________________________________________________________________     _______________________________________________________________________  Medication Rules  Purpose: To inform patients, and their family members, of our medication rules and regulations.  Applies to: All patients receiving prescriptions from our practice (written or electronic).  Pharmacy of record: This is the pharmacy where your electronic prescriptions will be sent. Make sure we have the correct one.  Electronic prescriptions: In compliance with the [place] (STOP) Act of 2017 (Session Lanny Cramp [phone]), effective February 11, 2018, all controlled substances must be electronically prescribed. Written prescriptions, faxing, or calling prescriptions to a pharmacy will no longer be done.  Prescription refills: These will be provided only during in-person appointments. No medications will be renewed without a "face-to-face" evaluation with your provider. Applies to all prescriptions.  NOTE: The following applies primarily to controlled substances (Opioid* Pain Medications).   Type of encounter (visit): For patients receiving controlled substances, face-to-face visits are required. (Not an option and not up to the patient.)  Patient's responsibilities: Pain Pills: Bring all pain pills to every appointment (except for procedure appointments). Pill Bottles: Bring pills in original pharmacy bottle. Bring bottle, even if empty.  Always bring the bottle of the most recent fill.  Medication refills: You are responsible for knowing and keeping track of what medications you are taking and when is it that you will need a refill. The day before your appointment:  write a list of all prescriptions that need to be refilled. The day of the appointment: give the list to the admitting nurse. Prescriptions will be written only during appointments. No prescriptions will be written on procedure days. If you forget a medication: it will not be "Called in", "Faxed", or "electronically sent". You will need to get another appointment to get these prescribed. No early refills. Do not call asking to have your prescription filled early. Partial  or short prescriptions: Occasionally your pharmacy may not have enough pills to fill your prescription.  NEVER ACCEPT a partial fill or a prescription that is short of the total amount of pills that you were prescribed.  With controlled substances the law allows 72 hours for the pharmacy to complete the prescription.  If the prescription is not completed within 72 hours, the pharmacist will require a new prescription to be written. This means that you will be short on your medicine and we WILL NOT send another prescription to complete your original prescription.  Instead, request the pharmacy to send a carrier to a nearby branch to get enough medication to provide you with your full prescription. Prescription Accuracy: You are responsible for carefully inspecting your prescriptions before leaving our office. Have the discharge nurse carefully go over each prescription with you, before taking them home. Make sure that your name is accurately spelled, that your address is correct. Check the name and dose of your medication to make sure it is accurate. Check the number of pills, and the written instructions to make sure they are clear and accurate. Make sure that you are given enough medication to last until your next medication refill appointment. Taking Medication: Take medication as prescribed. When it comes to controlled substances, taking less pills or less frequently than prescribed is permitted and encouraged. Never take more pills than  instructed. Never take the medication more frequently than prescribed.  Inform other Doctors: Always inform, all of your healthcare providers, of all the medications you take. Pain Medication from other Providers: You are not allowed to accept any additional pain medication from any other Doctor or Healthcare provider. There are two exceptions to this rule. (see below) In the event that you require additional pain medication, you are responsible for notifying us, as stated below. Cough Medicine: Often these contain an opioid, such as codeine or hydrocodone. Never accept or take cough medicine containing these opioids if you are already taking an opioid* medication. The combination may cause respiratory failure and death. Medication Agreement: You are responsible for carefully reading and following our Medication Agreement. This must be signed before receiving any prescriptions from our practice. Safely store a copy of your signed Agreement. Violations to the Agreement will result in no further prescriptions. (Additional copies of our Medication Agreement are available upon request.) Laws, Rules, & Regulations: All patients are expected to follow all Federal and [employer], Rules, [employer]. Ignorance of the Laws does not constitute a valid excuse.  Illegal drugs and Controlled Substances: The use of illegal substances (including, but not limited to marijuana and its derivatives) and/or the illegal use of any controlled substances is strictly prohibited. Violation of this rule may result in the immediate and permanent discontinuation of any and all prescriptions  being written by our practice. The use of any illegal substances is prohibited. Adopted CDC guidelines & recommendations: Target dosing levels will be at or below 60 MME/day. Use of benzodiazepines** is not recommended.  Exceptions: There are only two exceptions to the rule of not receiving pain medications from other Healthcare  Providers. Exception #1 (Emergencies): In the event of an emergency (i.e.: accident requiring emergency care), you are allowed to receive additional pain medication. However, you are responsible for: As soon as you are able, call our office [phone], at any time of the day or night, and leave a message stating your name, the date and nature of the emergency, and the name and dose of the medication prescribed. In the event that your call is answered by a member of our staff, make sure to document and save the date, time, and the name of the person that took your information.  Exception #2 (Planned Surgery): In the event that you are scheduled by another doctor or dentist to have any type of surgery or procedure, you are allowed (for a period no longer than 30 days), to receive additional pain medication, for the acute post-op pain. However, in this case, you are responsible for picking up a copy of our "Post-op Pain Management for Surgeons" handout, and giving it to your surgeon or dentist. This document is available at our office, and does not require an appointment to obtain it. Simply go to our office during business hours (Monday-Thursday from 8:00 AM to 4:00 PM) (Friday 8:00 AM to 12:00 Noon) or if you have a scheduled appointment with [place], prior to your surgery, and ask for it by name. In addition, you are responsible for: calling our office [phone], at any time of the day or night, and leaving a message stating your name, name of your surgeon, type of surgery, and date of procedure or surgery. Failure to comply with your responsibilities may result in termination of therapy involving the controlled substances. Medication Agreement Violation. Following the above rules, including your responsibilities will help you in avoiding a Medication Agreement Violation ("Breaking your Pain Medication Contract").  Consequences:  Not following the above rules may result in permanent discontinuation of  medication prescription therapy.  *Opioid medications include: morphine, codeine, oxycodone, oxymorphone, hydrocodone, hydromorphone, meperidine, tramadol, tapentadol, buprenorphine, fentanyl, methadone. **Benzodiazepine medications include: diazepam (Valium), alprazolam (Xanax), clonazepam (Klonopine), lorazepam (Ativan), clorazepate (Tranxene), chlordiazepoxide (Librium), estazolam (Prosom), oxazepam (Serax), temazepam (Restoril), triazolam (Halcion) (Last updated: 12/04/2021) ______________________________________________________________________    ______________________________________________________________________  Medication Recommendations and Reminders  Applies to: All patients receiving prescriptions (written and/or electronic).  Medication Rules & Regulations: You are responsible for reading, knowing, and following our "Medication Rules" document. These exist for your safety and that of others. They are not flexible and neither are we. Dismissing or ignoring them is an act of "non-compliance" that may result in complete and irreversible termination of such medication therapy. For safety reasons, "non-compliance" will not be tolerated. As with the [place] fundamental legal principle of "ignorance of the law is no defense", we will accept no excuses for not having read and knowing the content of documents provided to you by our practice.  Pharmacy of record:  Definition: This is the pharmacy where your electronic prescriptions will be sent.  We do not endorse any particular pharmacy. It is up to you and your insurance to decide what pharmacy to use.  We do not restrict you in your choice of pharmacy. However, once we write for your  prescriptions, we will NOT be re-sending more prescriptions to fix restricted supply problems created by your pharmacy, or your insurance.  The pharmacy listed in the electronic medical record should be the one where you want electronic prescriptions to be  sent. If you choose to change pharmacy, simply notify our nursing staff. Changes will be made only during your regular appointments and not over the phone.  Recommendations: Keep all of your pain medications in a safe place, under lock and key, even if you live alone. We will NOT replace lost, stolen, or damaged medication. We do not accept "Police Reports" as proof of medications having been stolen. After you fill your prescription, take 1 week's worth of pills and put them away in a safe place. You should keep a separate, properly labeled bottle for this purpose. The remainder should be kept in the original bottle. Use this as your primary supply, until it runs out. Once it's gone, then you know that you have 1 week's worth of medicine, and it is time to come in for a prescription refill. If you do this correctly, it is unlikely that you will ever run out of medicine. To make sure that the above recommendation works, it is very important that you make sure your medication refill appointments are scheduled at least 1 week before you run out of medicine. To do this in an effective manner, make sure that you do not leave the office without scheduling your next medication management appointment. Always ask the nursing staff to show you in your prescription , when your medication will be running out. Then arrange for the receptionist to get you a return appointment, at least 7 days before you run out of medicine. Do not wait until you have 1 or 2 pills left, to come in. This is very poor planning and does not take into consideration that we may need to cancel appointments due to bad weather, sickness, or emergencies affecting our staff. DO NOT ACCEPT A "Partial Fill": If for any reason your pharmacy does not have enough pills/tablets to completely fill or refill your prescription, do not allow for a "partial fill". The law allows the pharmacy to complete that prescription within 72 hours, without requiring a new  prescription. If they do not fill the rest of your prescription within those 72 hours, you will need a separate prescription to fill the remaining amount, which we will NOT provide. If the reason for the partial fill is your insurance, you will need to talk to the pharmacist about payment alternatives for the remaining tablets, but again, DO NOT ACCEPT A PARTIAL FILL, unless you can trust your pharmacist to obtain the remainder of the pills within 72 hours.  Prescription refills and/or changes in medication(s):  Prescription refills, and/or changes in dose or medication, will be conducted only during scheduled medication management appointments. (Applies to both, written and electronic prescriptions.) No refills on procedure days. No medication will be changed or started on procedure days. No changes, adjustments, and/or refills will be conducted on a procedure day. Doing so will interfere with the diagnostic portion of the procedure. No phone refills. No medications will be "called into the pharmacy". No Fax refills. No weekend refills. No Holliday refills. No after hours refills.  Remember:  Business hours are:  Monday to Thursday 8:00 AM to 4:00 PM Provider's Schedule: Milinda Pointer, MD - Appointments are:  Medication management: Monday and Wednesday 8:00 AM to 4:00 PM Procedure day: Tuesday and Thursday 7:30 AM  to 4:00 PM Gillis Santa, MD - Appointments are:  Medication management: Tuesday and Thursday 8:00 AM to 4:00 PM Procedure day: Monday and Wednesday 7:30 AM to 4:00 PM (Last update: 12/04/2021) ______________________________________________________________________    ____________________________________________________________________________________________  Drug Holidays  What is a "Drug Holiday"? Drug Holiday: is the name given to the process of slowly tapering down and temporarily stopping the pain medication for the purpose of decreasing or eliminating tolerance to  the drug.  Benefits Improved effectiveness Decreased required effective dose Improved pain control End dependence on high dose therapy Decrease cost of therapy Uncovering "opioid-induced hyperalgesia". (OIH)  What is "opioid hyperalgesia"? It is a paradoxical increase in pain caused by exposure to opioids. Stopping the opioid pain medication, contrary to the expected, it actually decreases or completely eliminates the pain. Ref.: "A comprehensive review of opioid-induced hyperalgesia". Brion Aliment, et.al. Pain Physician. 2011 Mar-Apr;14(2):145-61.  What is tolerance? Tolerance: the progressive loss of effectiveness of a pain medicine due to repetitive use. A common problem of opioid pain medications.  How long should a "Drug Holiday" last? Effectiveness depends on the patient staying off all opioid pain medicines for a minimum of 14 consecutive days. (2 weeks)  How about just taking less of the medicine? Does not work. Will not accomplish goal of eliminating the excess receptors.  How about switching to a different pain medicine? (AKA. "Opioid rotation") Does not work. Creates the illusion of effectiveness by taking advantage of inaccurate equivalent dose calculations between different opioids. -This "technique" was promoted by studies funded by [employer], such as [employer], creators of "OxyContin".  Can I stop the medicine "cold [place]"? Depends. You should always coordinate with your Pain Specialist to make the transition as smoothly as possible. Avoid stopping the medicine abruptly without consulting. We recommend a "slow taper".  What is a slow taper? Taper: refers to the gradual decrease in dose.   How do I stop/taper the dose? Slowly. Decrease the daily amount of pills that you take by one (1) pill every seven (7) days. This is called a "slow downward taper". Example: if you normally take four (4) pills per day, drop it to three (3) pills per day for seven  (7) days, then to two (2) pills per day for seven (7) days, then to one (1) per day for seven (7) days, and then stop the medicine. The 14 day "Drug Holiday" starts on the first day without medicine.   Will I experience withdrawals? Unlikely with a slow taper.  What triggers withdrawals? Withdrawals are triggered by the sudden/abrupt stop of high dose opioids. Withdrawals can be avoided by slowly decreasing the dose over a prolonged period of time.  What are withdrawals? Symptoms associated with sudden/abrupt reduction/stopping of high-dose, long-term use of pain medication. Withdrawal are seldom seen on low dose therapy, or patients rarely taking opioid medication.  Early Withdrawal Symptoms may include: Agitation Anxiety Muscle aches Increased tearing Insomnia Runny nose Sweating Yawning  Late symptoms may include: Abdominal cramping Diarrhea Dilated pupils Goose bumps Nausea Vomiting  (Last update: 01/20/2022) ____________________________________________________________________________________________    ____________________________________________________________________________________________  WARNING: CBD (cannabidiol) & Delta (Delta-8 tetrahydrocannabinol) products.   Applicable to:  All individuals currently taking or considering taking CBD (cannabidiol) and, more important, all patients taking opioid analgesic controlled substances (pain medication). (Example: oxycodone; oxymorphone; hydrocodone; hydromorphone; morphine; methadone; tramadol; tapentadol; fentanyl; buprenorphine; butorphanol; dextromethorphan; meperidine; codeine; etc.)  Introduction:  Recently there has been a drive towards the use of "natural" products for the treatment of different  conditions, including pain anxiety and sleep disorders. Marijuana and hemp are two varieties of the cannabis genus plants. Marijuana and its derivatives are illegal, while hemp and its derivatives are not. Cannabidiol  (CBD) and tetrahydrocannabinol (THC), are two natural compounds found in plants of the Cannabis genus. They can both be extracted from hemp or marijuana. Both compounds interact with your body's endocannabinoid system in very different ways. CBD is associated with pain relief (analgesia) while THC is associated with the psychoactive effects ("the high") obtained from the use of marijuana products. There are two main types of THC: Delta-9, which comes from the marijuana plant and it is illegal, and Delta-8, which comes from the hemp plant, and it is legal. (Both, Delta-9-THC and Delta-8-THC are psychoactive and give you "the high".)   Legality:  Marijuana and its derivatives: illegal Hemp and its derivatives: Legal (State dependent) UPDATE: (03/30/2021) The Drug Enforcement Agency ([place]) issued a letter stating that "delta" cannabinoids, including Delta-8-THCO and Delta-9-THCO, synthetically derived from hemp do not qualify as hemp and will be viewed as Schedule I drugs. (Schedule I drugs, substances, or chemicals are defined as drugs with no currently accepted medical use and a high potential for abuse. Some examples of Schedule I drugs are: heroin, lysergic acid diethylamide (LSD), marijuana (cannabis), 3,4-methylenedioxymethamphetamine (ecstasy), methaqualone, and peyote.) (https://jennings.com/)  Legal status of CBD in [place]:  "Conditionally Legal"  Reference: "FDA Regulation of Cannabis and Cannabis-Derived Products, Including Cannabidiol (CBD)" - [employer].com.pt  Warning:  CBD is not FDA approved and has not undergo the same manufacturing controls as prescription drugs.  This means that the purity and safety of available CBD may be questionable. Most of the time, despite manufacturer's claims, it is contaminated with THC (delta-9-tetrahydrocannabinol - the chemical in marijuana responsible  for the "HIGH").  When this is the case, the [facility] contaminant will trigger a positive urine drug screen (UDS) test for Marijuana (carboxy-THC).   The FDA recently put out a warning about 5 things that everyone should be aware of regarding Delta-8 THC: Delta-8 THC products have not been evaluated or approved by the FDA for safe use and may be marketed in ways that put the public health at risk. The FDA has received adverse event reports involving delta-8 THC-containing products. Delta-8 THC has psychoactive and intoxicating effects. Delta-8 THC manufacturing often involve use of potentially harmful chemicals to create the concentrations of delta-8 THC claimed in the marketplace. The final delta-8 THC product may have potentially harmful by-products (contaminants) due to the chemicals used in the process. Manufacturing of delta-8 THC products may occur in uncontrolled or unsanitary settings, which may lead to the presence of unsafe contaminants or other potentially harmful substances. Delta-8 THC products should be kept out of the reach of children and pets.  NOTE: Because a positive UDS for any illicit substance is a violation of our medication agreement, your opioid analgesics (pain medicine) may be permanently discontinued.  MORE ABOUT CBD  General Information: CBD was discovered in 19 and it is a derivative of the cannabis sativa genus plants (Marijuana and Hemp). It is one of the 113 identified substances found in Marijuana. It accounts for up to 40% of the plant's extract. As of 2018, preliminary clinical studies on CBD included research for the treatment of anxiety, movement disorders, and pain. CBD is available and consumed in multiple forms, including inhalation of smoke or vapor, as an aerosol spray, and by mouth. It may be supplied as an oil containing CBD, capsules, dried cannabis,  or as a liquid solution. CBD is thought not to be as psychoactive as THC (delta-9-tetrahydrocannabinol - the  chemical in marijuana responsible for the "HIGH"). Studies suggest that CBD may interact with different biological target receptors in the body, including cannabinoid and other neurotransmitter receptors. As of 2018 the mechanism of action for its biological effects has not been determined.  Side-effects  Adverse reactions: Dry mouth, diarrhea, decreased appetite, fatigue, drowsiness, malaise, weakness, sleep disturbances, and others.  Drug interactions:  CBD may interact with medications such as blood-thinners. CBD causes drowsiness on its own and it will increase drowsiness caused by other medications, including antihistamines (such as Benadryl), benzodiazepines (Xanax, Ativan, Valium), antipsychotics, antidepressants, opioids, alcohol and supplements such as kava, melatonin and St. John's Wort.  Other drug interactions: Brivaracetam (Briviact); Caffeine; Carbamazepine (Tegretol); Citalopram (Celexa); Clobazam (Onfi); Eslicarbazepine (Aptiom); Everolimus (Zostress); Lithium; Methadone (Dolophine); Rufinamide (Banzel); Sedative medications (CNS depressants); Sirolimus (Rapamune); Stiripentol (Diacomit); Tacrolimus (Prograf); Tamoxifen ; Soltamox); Topiramate (Topamax); Valproate; Warfarin (Coumadin); Zonisamide. (Last update: 01/21/2022) ____________________________________________________________________________________________   ____________________________________________________________________________________________  Naloxone Nasal Spray  Why am I receiving this medication? [place] STOP ACT requires that all patients taking high dose opioids or at risk of opioids respiratory depression, be prescribed an opioid reversal agent, such as Naloxone (AKA: Narcan).  What is this medication? NALOXONE (nal OX one) treats opioid overdose, which causes slow or shallow breathing, severe drowsiness, or trouble staying awake. Call emergency services after using this medication. You may need  additional treatment. Naloxone works by reversing the effects of opioids. It belongs to a group of medications called opioid blockers.  COMMON BRAND NAME(S): Kloxxado, Narcan  What should I tell my care team before I take this medication? They need to know if you have any of these conditions: Heart disease Substance use disorder An unusual or allergic reaction to naloxone, other medications, foods, dyes, or preservatives Pregnant or trying to get pregnant Breast-feeding  When to use this medication? This medication is to be used for the treatment of respiratory depression (less than 8 breaths per minute) secondary to opioid overdose.   How to use this medication? This medication is for use in the nose. Lay the person on their back. Support their neck with your hand and allow the head to tilt back before giving the medication. The nasal spray should be given into 1 nostril. After giving the medication, move the person onto their side. Do not remove or test the nasal spray until ready to use. Get emergency medical help right away after giving the first dose of this medication, even if the person wakes up. You should be familiar with how to recognize the signs and symptoms of a narcotic overdose. If more doses are needed, give the additional dose in the other nostril. Talk to your care team about the use of this medication in children. While this medication may be prescribed for children as young as newborns for selected conditions, precautions do apply.  Naloxone Overdosage: If you think you have taken too much of this medicine contact a poison control center or emergency room at once.  NOTE: This medicine is only for you. Do not share this medicine with others.  What if I miss a dose? This does not apply.  What may interact with this medication? This is only used during an emergency. No interactions are expected during emergency use. This list may not describe all possible interactions.  Give your health care provider a list of all the medicines, herbs, non-prescription drugs, or  dietary supplements you use. Also tell them if you smoke, drink alcohol, or use illegal drugs. Some items may interact with your medicine.  What should I watch for while using this medication? Keep this medication ready for use in the case of an opioid overdose. Make sure that you have the phone number of your care team and local hospital ready. You may need to have additional doses of this medication. Each nasal spray contains a single dose. Some emergencies may require additional doses. After use, bring the treated person to the nearest hospital or call 911. Make sure the treating care team knows that the person has received a dose of this medication. You will receive additional instructions on what to do during and after use of this medication before an emergency occurs.  What side effects may I notice from receiving this medication? Side effects that you should report to your care team as soon as possible: Allergic reactions--skin rash, itching, hives, swelling of the face, lips, tongue, or throat Side effects that usually do not require medical attention (report these to your care team if they continue or are bothersome): Constipation Dryness or irritation inside the nose Headache Increase in blood pressure Muscle spasms Stuffy nose Toothache This list may not describe all possible side effects. Call your doctor for medical advice about side effects. You may report side effects to FDA at 1-800-FDA-1088.  Where should I keep my medication? Because this is an emergency medication, you should keep it with you at all times.  Keep out of the reach of children and pets. Store between 20 and 25 degrees C (68 and 77 degrees F). Do not freeze. Throw away any unused medication after the expiration date. Keep in original box until ready to use.  NOTE: This sheet is a summary. It may not cover all possible  information. If you have questions about this medicine, talk to your doctor, pharmacist, or health care provider.   2023 [employer]/Gold Standard (2020-10-06 00:00:00)  ____________________________________________________________________________________________

## 2022-02-26 NOTE — Progress Notes (Signed)
PROVIDER NOTE: Information contained herein reflects review and annotations entered in association with encounter. Interpretation of such information and data should be left to medically-trained personnel. Information provided to patient can be located elsewhere in the medical record under "Patient Instructions". Document created using STT-dictation technology, any transcriptional errors that may result from process are unintentional.    Patient: Charles Ewing  Service Category: E/M  Provider: Gaspar Cola, MD  DOB: 01-May-1940  DOS: 02/27/2022  Referring Provider: [name] *  MRN: [id number]  Specialty: Interventional Pain Management  PCP: Olin Hauser, DO  Type: Established Patient  Setting: Ambulatory outpatient    Location: Office  Delivery: Face-to-face     HPI  Mr. AKEEL REFFNER, a 82 y.o. year old male, is here today because of his Chronic pain syndrome [G89.4]. Mr. Vanderwerf primary complain today is Back Pain (lower) Last encounter: My last encounter with him was on 11/29/2021. Pertinent problems: Mr. Minehart has Osteoarthritis of knees (Bilateral); Bilateral lower extremity edema; Chronic pain syndrome; Chronic hip pain (2ry area of Pain) (Right); Chronic lower extremity pain (3ry area of Pain) (Right); Chronic low back pain (1ry area of Pain) (Bilateral) (R>L) w/o sciatica; Lumbar facet syndrome; Dextroscoliosis of lumbar spine; DDD (degenerative disc disease), lumbosacral; Osteopenia of lumbar spine; Osteopenia determined by x-ray; Osteoarthritis of sacroiliac joints (HCC) (Bilateral); Osteoarthritis of hips (Bilateral); Chronic sacroiliac joint pain (Bilateral); Sacroiliac joint dysfunction (Bilateral); Spondylosis without myelopathy or radiculopathy, lumbosacral region; and Other spondylosis, sacral and sacrococcygeal region on their pertinent problem list. Pain Assessment: Severity of Chronic pain is reported as a 8 /10. Location: Back Right, Left, Upper/pain  radiaities down his right leg to his knee. Onset: More than a month ago. Quality: Aching, Burning, Constant, Throbbing. Timing: Constant. Modifying factor(s): Meds. Vitals:  height is 6' (1.829 m) and weight is 199 lb (90.3 kg). His temperature is 97 F (36.1 C) (abnormal). His blood pressure is 132/72 and his pulse is 77. His oxygen saturation is 100%.  BMI: Estimated body mass index is 26.99 kg/m as calculated from the following:   Height as of this encounter: 6' (1.829 m).   Weight as of this encounter: 199 lb (90.3 kg).  Reason for encounter: medication management.  The patient indicates doing well with the current medication regimen. No adverse reactions or side effects reported to the medications.   RTCB: 05/30/2022   The patient indicates currently having increased pain in the lower back which he describes as being in the midline and towards the right side.  He also refers pain in the hip area which he localizes to the posterior aspect of the hips (buttocks).  In addition to this, he also refers having pain going down the right leg through the posterior aspect of the leg down to the knee.  Today's physical exam was positive for decreased range of motion of the lumbar spine with pain on attempted hyperextension and rotation towards the right side.  Provocative Patrick maneuver was positive bilaterally for hip joint arthralgia and significant decreased range of motion on the left hip despite the fact that the one that hurts actually the right.  No evidence of referred pain towards the back on the maneuver indicating no current evidence of sacroiliac joint pain.  Last interventional therapy done on 10/18/2021 consisted of right lumbar facet RFA #1.  According to the patient he attained 100% relief of the pain for the duration of the local anesthetic followed by an ongoing 50% improvement of his low  back pain.  However, he stated that he had quite a bit of improvement on his range of motion and  function.  While in the past he used to come in on a wheelchair he refers that now he is able to walk at home without the use of a cane or walker.  Based on the above, I have scheduled the patient to return for a right-sided L4-5 interlaminar LESI #1 under fluoroscopic guidance.  In addition, I have ordered an MRI of the lumbar spine.  He is 82 years old and he is having difficulty standing up straight with increased low back pain and lower extremity pain upon attempting to do this which is pathopneumonic of Lumbar spinal stenosis.  Pharmacotherapy Assessment  Analgesic: Oxycodone IR 5 mg tablet, 1 tab p.o. 2 times daily (#60) MME/day: 15 mg/day   Monitoring: [place] PMP: PDMP reviewed during this encounter.       Pharmacotherapy: No side-effects or adverse reactions reported. Compliance: No problems identified. Effectiveness: Clinically acceptable.  Chauncey Fischer, RN  02/27/2022  1:47 PM  Sign when Signing Visit Nursing Pain Medication Assessment:  Safety precautions to be maintained throughout the outpatient stay will include: orient to surroundings, keep bed in low position, maintain call bell within reach at all times, provide assistance with transfer out of bed and ambulation.  Medication Inspection Compliance: Pill count conducted under aseptic conditions, in front of the patient. Neither the pills nor the bottle was removed from the patient's sight at any time. Once count was completed pills were immediately returned to the patient in their original bottle.  Medication: Oxycodone IR Pill/Patch Count:  17 of 60 pills remain Pill/Patch Appearance: Markings consistent with prescribed medication Bottle Appearance: Standard pharmacy container. Clearly labeled. Filled Date: 67 / 20 / 2023 Last Medication intake:  YesterdaySafety precautions to be maintained throughout the outpatient stay will include: orient to surroundings, keep bed in low position, maintain call bell within reach at all times,  provide assistance with transfer out of bed and ambulation.     No results found for: "CBDTHCR" No results found for: "D8THCCBX" No results found for: "D9THCCBX"  UDS:  Summary  Date Value Ref Range Status  06/14/2021 Note  Final    Comment:    ==================================================================== Compliance Drug Analysis, Ur ==================================================================== Test                             Result       Flag       Units  Drug Present and Declared for Prescription Verification   Lorazepam                      200          EXPECTED   ng/mg creat    Source of lorazepam is a scheduled prescription medication.    Oxycodone                      1671         EXPECTED   ng/mg creat   Noroxycodone                   2522         EXPECTED   ng/mg creat    Sources of oxycodone include scheduled prescription medications.    Noroxycodone is an expected metabolite of oxycodone.    Gabapentin  PRESENT      EXPECTED   Fluoxetine                     PRESENT      EXPECTED   Norfluoxetine                  PRESENT      EXPECTED    Norfluoxetine is an expected metabolite of fluoxetine.  Drug Absent but Declared for Prescription Verification   Salicylate                     Not Detected UNEXPECTED    Aspirin, as indicated in the declared medication list, is not always    detected even when used as directed.    Metoprolol                     Not Detected UNEXPECTED ==================================================================== Test                      Result    Flag   Units      Ref Range   Creatinine              41               mg/dL      >=20 ==================================================================== Declared Medications:  The flagging and interpretation on this report are based on the  following declared medications.  Unexpected results may arise from  inaccuracies in the declared medications.   **Note:  The testing scope of this panel includes these medications:   Fluoxetine (Prozac)  Gabapentin (Neurontin)  Lorazepam (Ativan)  Metoprolol (Toprol)  Oxycodone (Roxicodone)   **Note: The testing scope of this panel does not include small to  moderate amounts of these reported medications:   Aspirin   **Note: The testing scope of this panel does not include the  following reported medications:   Albuterol (Ventolin HFA)  Amlodipine (Norvasc)  Fluticasone (Flonase)  Furosemide (Lasix)  Losartan (Cozaar)  Meloxicam (Mobic)  Mupirocin (Bactroban)  Nystatin  Pantoprazole (Protonix)  Potassium Chloride  Simvastatin (Zocor)  Sucralfate (Carafate)  Tamsulosin (Flomax) ==================================================================== For clinical consultation, please call [phone]. ====================================================================       ROS  Constitutional: Denies any fever or chills Gastrointestinal: No reported hemesis, hematochezia, vomiting, or acute GI distress Musculoskeletal: Denies any acute onset joint swelling, redness, loss of ROM, or weakness Neurological: No reported episodes of acute onset apraxia, aphasia, dysarthria, agnosia, amnesia, paralysis, loss of coordination, or loss of consciousness  Medication Review  FLUoxetine, LORazepam, Tiotropium Bromide Monohydrate, albuterol, amLODipine, amoxicillin-clavulanate, aspirin EC, ciprofloxacin-dexamethasone, clotrimazole-betamethasone, cyanocobalamin, fluticasone, furosemide, gabapentin, losartan, meloxicam, naloxone, oxyCODONE, pantoprazole, potassium chloride, simvastatin, tamsulosin, and terbinafine  History Review  Allergy: Mr. Korber is allergic to nexium [esomeprazole magnesium]. Drug: Mr. Walla  reports no history of drug use. Alcohol:  reports no history of alcohol use. Tobacco:  reports that he has been smoking cigarettes. He has a 70.00 pack-year smoking history. He has never used  smokeless tobacco. Social: Mr. Canter  reports that he has been smoking cigarettes. He has a 70.00 pack-year smoking history. He has never used smokeless tobacco. He reports that he does not drink alcohol and does not use drugs. Medical:  has a past medical history of Abdominal aortic aneurysm (AAA) ([place]) (2013), Anxiety, Arrhythmia, Arthritis, Colon polyp, COPD (chronic obstructive pulmonary disease) ([place]), Coronary artery disease, Depression, GERD (gastroesophageal reflux disease), Hyperlipidemia, Hypertension, Skin cancer,  and Thrush. Surgical: Mr. Talton  has a past surgical history that includes Skin cancer excision; Colon polyp surgery; Transurethral resection of prostate (2010); Cataract extraction w/PHACO (Left, 05/09/2020); and Cataract extraction w/PHACO (Right, 05/23/2020). Family: family history includes Kidney cancer in his mother.  Laboratory Chemistry Profile   Renal Lab Results  Component Value Date   BUN 18 06/13/2021   CREATININE 1.18 06/13/2021   BCR 15 06/13/2021   GFRAA 64 01/11/2019   GFRNONAA >60 12/23/2020    Hepatic Lab Results  Component Value Date   AST 13 06/13/2021   ALT 11 10/30/2020   ALBUMIN 3.9 06/13/2021   ALKPHOS 70 06/13/2021    Electrolytes Lab Results  Component Value Date   NA 138 06/13/2021   K 4.6 06/13/2021   CL 102 06/13/2021   CALCIUM 8.8 06/13/2021   MG 2.3 06/13/2021    Bone Lab Results  Component Value Date   25OHVITD1 14 (L) 06/13/2021   25OHVITD2 1.8 06/13/2021   25OHVITD3 12 06/13/2021    Inflammation (CRP: Acute Phase) (ESR: Chronic Phase) Lab Results  Component Value Date   CRP 1 06/13/2021   ESRSEDRATE 20 06/13/2021         Note: Above Lab results reviewed.  Recent Imaging Review  DG PAIN CLINIC C-ARM 1-60 MIN NO REPORT Fluoro was used, but no Radiologist interpretation will be provided.  Please refer to "NOTES" tab for provider progress note. Note: Reviewed        Physical Exam  General appearance: Well  nourished, well developed, and well hydrated. In no apparent acute distress Mental status: Alert, oriented x 3 (person, place, & time)       Respiratory: No evidence of acute respiratory distress Eyes: PERLA Vitals: BP 132/72   Pulse 77   Temp (!) 97 F (36.1 C)   Ht 6' (1.829 m)   Wt 199 lb (90.3 kg)   SpO2 100%   BMI 26.99 kg/m  BMI: Estimated body mass index is 26.99 kg/m as calculated from the following:   Height as of this encounter: 6' (1.829 m).   Weight as of this encounter: 199 lb (90.3 kg). Ideal: Ideal body weight: 77.6 kg (171 lb 1.2 oz) Adjusted ideal body weight: 82.7 kg (182 lb 3.9 oz)  Assessment   Diagnosis Status  1. Chronic pain syndrome   2. Chronic low back pain (1ry area of Pain) (Bilateral) (R>L) w/o sciatica   3. Chronic hip pain (2ry area of Pain) (Right)   4. Chronic lower extremity pain (3ry area of Pain) (Right)   5. DDD (degenerative disc disease), lumbosacral   6. Osteoarthritis of hips (Bilateral)   7. Lumbar facet syndrome (Right)   8. Osteoarthritis of sacroiliac joints (HCC) (Bilateral)   9. Dextroscoliosis of lumbar spine   10. Pharmacologic therapy   11. Chronic use of opiate for therapeutic purpose   12. Encounter for medication management   13. Encounter for chronic pain management    Controlled Controlled Controlled   Updated Problems: No problems updated.  Plan of Care  Problem-specific:  No problem-specific Assessment & Plan notes found for this encounter.  Mr. ISMAR YABUT has a current medication list which includes the following long-term medication(s): albuterol, amlodipine, fluoxetine, fluticasone, furosemide, losartan, naloxone, potassium chloride, simvastatin, spiriva respimat, gabapentin, [START ON 03/01/2022] oxycodone, [START ON 03/31/2022] oxycodone, and [START ON 04/30/2022] oxycodone.  Pharmacotherapy (Medications Ordered): Meds ordered this encounter  Medications   oxyCODONE (OXY IR/ROXICODONE) 5 MG immediate  release tablet  Sig: Take 1 tablet (5 mg total) by mouth 2 (two) times daily as needed for severe pain. Each refill must last 30 days.    Dispense:  60 tablet    Refill:  0    DO NOT: delete (not duplicate); no partial-fill (will deny script to complete), no refill request (F/U required). DISPENSE: 1 day early if closed on fill date. WARN: No CNS-depressants within 8 hrs of med.   oxyCODONE (OXY IR/ROXICODONE) 5 MG immediate release tablet    Sig: Take 1 tablet (5 mg total) by mouth 2 (two) times daily as needed for severe pain. Each refill must last 30 days.    Dispense:  60 tablet    Refill:  0    DO NOT: delete (not duplicate); no partial-fill (will deny script to complete), no refill request (F/U required). DISPENSE: 1 day early if closed on fill date. WARN: No CNS-depressants within 8 hrs of med.   oxyCODONE (OXY IR/ROXICODONE) 5 MG immediate release tablet    Sig: Take 1 tablet (5 mg total) by mouth 2 (two) times daily as needed for severe pain. Each refill must last 30 days.    Dispense:  60 tablet    Refill:  0    DO NOT: delete (not duplicate); no partial-fill (will deny script to complete), no refill request (F/U required). DISPENSE: 1 day early if closed on fill date. WARN: No CNS-depressants within 8 hrs of med.   Orders:  Orders Placed This Encounter  Procedures   Lumbar Epidural Injection    Standing Status:   Future    Standing Expiration Date:   05/29/2022    Scheduling Instructions:     Procedure: Interlaminar Lumbar Epidural Steroid injection (LESI)  L4-5     Laterality: Right-sided     Sedation: Patient's choice.     Timeframe: ASAA    Order Specific Question:   Where will this procedure be performed?    Answer:   [facility]   MR LUMBAR SPINE WO CONTRAST    Patient presents with axial pain with possible radicular component. Please assist [place] in identifying specific level(s) and laterality of any additional findings such as: 1. Facet (Zygapophyseal) joint  DJD (Hypertrophy, space narrowing, subchondral sclerosis, and/or osteophyte formation) 2. DDD and/or IVDD (Loss of disc height, desiccation, gas patterns, osteophytes, endplate sclerosis, or "Black disc disease") 3. Pars defects 4. Spondylolisthesis, spondylosis, and/or spondyloarthropathies (include Degree/Grade of displacement in mm) (stability) 5. Vertebral body Fractures (acute/chronic) (state percentage of collapse) 6. Demineralization (osteopenia/osteoporotic) 7. Bone pathology 8. Foraminal narrowing  9. Surgical changes 10. Central, Lateral Recess, and/or Foraminal Stenosis (include AP diameter of stenosis in mm) 11. Surgical changes (hardware type, status, and presence of fibrosis) 12. Modic Type Changes (MRI only) 13. IVDD (Disc bulge, protrusion, herniation, extrusion) (Level, laterality, extent)    Standing Status:   Future    Standing Expiration Date:   03/30/2022    Scheduling Instructions:     Please make sure that the patient understands that this needs to be done as soon as possible. Never have the patient do the imaging "just before the next appointment". Inform patient that having the imaging done within the [facility] will expedite the availability of the results and will provide      imaging availability to the requesting physician. In addition inform the patient that the imaging order has an expiration date and will not be renewed if not done within the active period.    Order Specific  Question:   What is the patient's sedation requirement?    Answer:   No Sedation    Order Specific Question:   Does the patient have a pacemaker or implanted devices?    Answer:   No    Order Specific Question:   Preferred imaging location?    Answer:   [facility] (table limit-350lbs)    Order Specific Question:   Call Results- Best Contact Number?    Answer:   [phone] ([facility])    Order Specific Question:   Radiology Contrast Protocol - do NOT remove file path     Answer:   [email or web address]   Follow-up plan:   Return for [facility]): (R) L4-5 LESI #1.     Interventional Therapies  Risk  Complexity Considerations:   Estimated body mass index is 28.29 kg/m as calculated from the following:   Height as of this encounter: 5' 10.5" (1.791 m).   Weight as of this encounter: 200 lb (90.7 kg). History of: abdominal aortic aneurysm (AAA); hypertension; cardiomyopathy; coronary artery disease; dissection of abdominal aorta; orthostatic hypotension; irregular heart rhythm; chronic kidney disease; shortness of breath; tobacco abuse; COPD; GERD   Planned  Pending:   MRI of the lumbar spine Diagnostic/therapeutic right L4-5 LESI #1    Under consideration:   Diagnostic/therapeutic right L4-5 LESI #1  Diagnostic/therapeutic bilateral IA hip joint inj. #1    Completed:   Therapeutic right lumbar facet RFA x1 (10/18/2021) (100/100/50/50)  Diagnostic right lumbar facet MBB x2 (09/13/2021) (100/100/50/50)    Completed by other providers:   Diagnostic/therapeutic bilateral IA steroid knee inj. (03/15/2016) by Carlynn Spry, PA Rosanne Gutting)    Therapeutic  Palliative (PRN) options:   Therapeutic right lumbar facet MBB      Recent Visits Date Type Provider Dept  11/29/21 Office Visit Milinda Pointer, MD [facility]  Showing recent visits within past 90 days and meeting all other requirements Today's Visits Date Type Provider Dept  02/27/22 Office Visit Milinda Pointer, MD [facility]  Showing today's visits and meeting all other requirements Future Appointments Date Type Provider Dept  05/27/22 Appointment Milinda Pointer, MD [facility]  Showing future appointments within next 90 days and meeting all other requirements  I discussed the assessment and treatment plan with the patient. The patient was provided an opportunity to ask questions and all were answered. The patient agreed with  the plan and demonstrated an understanding of the instructions.  Patient advised to call back or seek an in-person evaluation if the symptoms or condition worsens.  Duration of encounter: 30 minutes.  Total time on encounter, as per AMA guidelines included both the face-to-face and non-face-to-face time personally spent by the physician and/or other qualified health care professional(s) on the day of the encounter (includes time in activities that require the physician or other qualified health care professional and does not include time in activities normally performed by clinical staff). Physician's time may include the following activities when performed: Preparing to see the patient (e.g., pre-charting review of records, searching for previously ordered imaging, lab work, and nerve conduction tests) Review of prior analgesic pharmacotherapies. Reviewing PMP Interpreting ordered tests (e.g., lab work, imaging, nerve conduction tests) Performing post-procedure evaluations, including interpretation of diagnostic procedures Obtaining and/or reviewing separately obtained history Performing a medically appropriate examination and/or evaluation Counseling and educating the patient/family/caregiver Ordering medications, tests, or procedures Referring and communicating with other health care professionals (when not separately reported) Documenting clinical information in the electronic or  other health record Independently interpreting results (not separately reported) and communicating results to the patient/ family/caregiver Care coordination (not separately reported)  Note by: Gaspar Cola, MD Date: 02/27/2022; Time: 2:27 PM

## 2022-02-27 ENCOUNTER — Encounter: Payer: Self-pay | Admitting: Pain Medicine

## 2022-02-27 ENCOUNTER — Ambulatory Visit: Payer: 59 | Attending: Pain Medicine | Admitting: Pain Medicine

## 2022-02-27 VITALS — BP 132/72 | HR 77 | Temp 97.0°F | Ht 72.0 in | Wt 199.0 lb

## 2022-02-27 DIAGNOSIS — M5137 Other intervertebral disc degeneration, lumbosacral region: Secondary | ICD-10-CM | POA: Diagnosis not present

## 2022-02-27 DIAGNOSIS — M47816 Spondylosis without myelopathy or radiculopathy, lumbar region: Secondary | ICD-10-CM

## 2022-02-27 DIAGNOSIS — M25551 Pain in right hip: Secondary | ICD-10-CM | POA: Insufficient documentation

## 2022-02-27 DIAGNOSIS — Z79891 Long term (current) use of opiate analgesic: Secondary | ICD-10-CM | POA: Insufficient documentation

## 2022-02-27 DIAGNOSIS — Z79899 Other long term (current) drug therapy: Secondary | ICD-10-CM

## 2022-02-27 DIAGNOSIS — M545 Low back pain, unspecified: Secondary | ICD-10-CM | POA: Diagnosis not present

## 2022-02-27 DIAGNOSIS — M4186 Other forms of scoliosis, lumbar region: Secondary | ICD-10-CM | POA: Diagnosis not present

## 2022-02-27 DIAGNOSIS — M79604 Pain in right leg: Secondary | ICD-10-CM | POA: Diagnosis not present

## 2022-02-27 DIAGNOSIS — G894 Chronic pain syndrome: Secondary | ICD-10-CM | POA: Diagnosis not present

## 2022-02-27 DIAGNOSIS — M461 Sacroiliitis, not elsewhere classified: Secondary | ICD-10-CM | POA: Diagnosis not present

## 2022-02-27 DIAGNOSIS — M160 Bilateral primary osteoarthritis of hip: Secondary | ICD-10-CM | POA: Diagnosis not present

## 2022-02-27 DIAGNOSIS — G8929 Other chronic pain: Secondary | ICD-10-CM | POA: Insufficient documentation

## 2022-02-27 MED ORDER — OXYCODONE HCL 5 MG PO TABS: 5.0000 mg | ORAL_TABLET | Freq: Two times a day (BID) | ORAL | 0 refills | Status: DC | PRN

## 2022-02-27 NOTE — Progress Notes (Signed)
Nursing Pain Medication Assessment:  Safety precautions to be maintained throughout the outpatient stay will include: orient to surroundings, keep bed in low position, maintain call bell within reach at all times, provide assistance with transfer out of bed and ambulation.  Medication Inspection Compliance: Pill count conducted under aseptic conditions, in front of the patient. Neither the pills nor the bottle was removed from the patient's sight at any time. Once count was completed pills were immediately returned to the patient in their original bottle.  Medication: Oxycodone IR Pill/Patch Count:  17 of 60 pills remain Pill/Patch Appearance: Markings consistent with prescribed medication Bottle Appearance: Standard pharmacy container. Clearly labeled. Filled Date: 63 / 20 / 2023 Last Medication intake:  YesterdaySafety precautions to be maintained throughout the outpatient stay will include: orient to surroundings, keep bed in low position, maintain call bell within reach at all times, provide assistance with transfer out of bed and ambulation.

## 2022-02-28 ENCOUNTER — Other Ambulatory Visit: Payer: Self-pay | Admitting: Family Medicine

## 2022-02-28 DIAGNOSIS — J432 Centrilobular emphysema: Secondary | ICD-10-CM

## 2022-02-28 DIAGNOSIS — E785 Hyperlipidemia, unspecified: Secondary | ICD-10-CM

## 2022-02-28 NOTE — Telephone Encounter (Signed)
Requested medication (s) are due for refill today: yes  Requested medication (s) are on the active medication list: yes    Last refill: 10/05/21 #30 0 refills  Future visit scheduled no  Notes to clinic:Failed due to labs, please review. Thank you.  Requested Prescriptions  Pending Prescriptions Disp Refills   simvastatin (ZOCOR) 20 MG tablet [Pharmacy Med Name: SIMVASTATIN 20 MG TAB] 30 tablet 0    Sig: TAKE ONE TABLET BY MOUTH AT BEDTIME     Cardiovascular:  Antilipid - Statins Failed - 02/28/2022  9:15 AM      Failed - Lipid Panel in normal range within the last 12 months    Cholesterol  Date Value Ref Range Status  10/30/2020 96 <200 mg/dL Final   LDL Cholesterol (Calc)  Date Value Ref Range Status  10/30/2020 39 mg/dL (calc) Final    Comment:    Reference range: <100 . Desirable range <100 mg/dL for primary prevention;   <70 mg/dL for patients with CHD or diabetic patients  with > or = 2 CHD risk factors. Marland Kitchen LDL-C is now calculated using the Martin-Hopkins  calculation, which is a validated novel method providing  better accuracy than the Friedewald equation in the  estimation of LDL-C.  Cresenciano Genre et al. Annamaria Helling. 1610;960(45): 2061-2068  (http://education.[facility].com/faq/FAQ164)    HDL  Date Value Ref Range Status  10/30/2020 40 > OR = 40 mg/dL Final   Triglycerides  Date Value Ref Range Status  10/30/2020 87 <150 mg/dL Final         Passed - Patient is not pregnant      Passed - Valid encounter within last 12 months    Recent Outpatient Visits           3 months ago Tinea pedis of right foot   [place], DO   6 months ago Benign hypertension with CKD (chronic kidney disease) stage III [facility])   [place], DO   1 year ago Chronic right-sided low back pain with bilateral sciatica   [place], DO   1 year ago Acute  intractable headache, unspecified headache type   [facility] [place], Coralie Keens, NP   1 year ago Annual physical exam   [place], Devonne Doughty, DO

## 2022-03-01 NOTE — Telephone Encounter (Signed)
Requested Prescriptions  Pending Prescriptions Disp Refills   [place] 2.5 MCG/ACT AERS [Pharmacy Med Name: SPIRIVA RESPIMAT 2.5 MCG/ACT INH AE] 4 g 2    Sig: INHALE 2 PUFFS BY MOUTH INTO THE LUNGS DAILY     Pulmonology:  Anticholinergic Agents Passed - 02/28/2022  5:50 PM      Passed - Valid encounter within last 12 months    Recent Outpatient Visits           3 months ago Tinea pedis of right foot   [place], DO   6 months ago Benign hypertension with CKD (chronic kidney disease) stage III [facility])   [place], DO   1 year ago Chronic right-sided low back pain with bilateral sciatica   [place], DO   1 year ago Acute intractable headache, unspecified headache type   [facility] [place], Coralie Keens, NP   1 year ago Annual physical exam   [place], Devonne Doughty, DO

## 2022-03-04 ENCOUNTER — Other Ambulatory Visit: Payer: Self-pay | Admitting: Family Medicine

## 2022-03-04 DIAGNOSIS — I129 Hypertensive chronic kidney disease with stage 1 through stage 4 chronic kidney disease, or unspecified chronic kidney disease: Secondary | ICD-10-CM

## 2022-03-05 NOTE — Telephone Encounter (Signed)
Requested Prescriptions  Pending Prescriptions Disp Refills   amLODipine (NORVASC) 5 MG tablet [Pharmacy Med Name: AMLODIPINE BESYLATE 5 MG TAB] 90 tablet 0    Sig: TAKE 1 TABLET BY MOUTH DAILY     Cardiovascular: Calcium Channel Blockers 2 Passed - 03/04/2022  4:20 PM      Passed - Last BP in normal range    BP Readings from Last 1 Encounters:  02/27/22 132/72         Passed - Last Heart Rate in normal range    Pulse Readings from Last 1 Encounters:  02/27/22 77         Passed - Valid encounter within last 6 months    Recent Outpatient Visits           3 months ago Tinea pedis of right foot   [place], DO   7 months ago Benign hypertension with CKD (chronic kidney disease) stage III [facility])   [place], DO   1 year ago Chronic right-sided low back pain with bilateral sciatica   [place], DO   1 year ago Acute intractable headache, unspecified headache type   [facility] [place], Coralie Keens, NP   1 year ago Annual physical exam   [facility] [place], Devonne Doughty, [place]

## 2022-03-08 ENCOUNTER — Ambulatory Visit
Admission: RE | Admit: 2022-03-08 | Discharge: 2022-03-08 | Disposition: A | Discharge status: Home / Self Care | Payer: 59 | Source: Ambulatory Visit | Attending: Pain Medicine | Admitting: Pain Medicine

## 2022-03-08 DIAGNOSIS — G894 Chronic pain syndrome: Secondary | ICD-10-CM | POA: Diagnosis not present

## 2022-03-08 DIAGNOSIS — M51379 Other intervertebral disc degeneration, lumbosacral region without mention of lumbar back pain or lower extremity pain: Secondary | ICD-10-CM

## 2022-03-08 DIAGNOSIS — M461 Sacroiliitis, not elsewhere classified: Secondary | ICD-10-CM

## 2022-03-08 DIAGNOSIS — M5416 Radiculopathy, lumbar region: Secondary | ICD-10-CM | POA: Diagnosis not present

## 2022-03-08 DIAGNOSIS — M5137 Other intervertebral disc degeneration, lumbosacral region: Secondary | ICD-10-CM

## 2022-03-08 DIAGNOSIS — G8929 Other chronic pain: Secondary | ICD-10-CM

## 2022-03-08 DIAGNOSIS — M79604 Pain in right leg: Secondary | ICD-10-CM | POA: Insufficient documentation

## 2022-03-08 DIAGNOSIS — M545 Low back pain, unspecified: Secondary | ICD-10-CM | POA: Diagnosis not present

## 2022-03-08 DIAGNOSIS — M4186 Other forms of scoliosis, lumbar region: Secondary | ICD-10-CM

## 2022-03-08 DIAGNOSIS — M47816 Spondylosis without myelopathy or radiculopathy, lumbar region: Secondary | ICD-10-CM | POA: Diagnosis not present

## 2022-03-08 DIAGNOSIS — M25551 Pain in right hip: Secondary | ICD-10-CM | POA: Insufficient documentation

## 2022-03-08 DIAGNOSIS — Z79899 Other long term (current) drug therapy: Secondary | ICD-10-CM

## 2022-03-08 DIAGNOSIS — Z79891 Long term (current) use of opiate analgesic: Secondary | ICD-10-CM

## 2022-03-08 DIAGNOSIS — M160 Bilateral primary osteoarthritis of hip: Secondary | ICD-10-CM | POA: Diagnosis not present

## 2022-03-11 NOTE — Progress Notes (Unsigned)
PROVIDER NOTE: Interpretation of information contained herein should be left to medically-trained personnel. Specific patient instructions are provided elsewhere under "Patient Instructions" section of medical record. This document was created in part using STT-dictation technology, any transcriptional errors that may result from this process are unintentional.  Patient: Charles Ewing Type: Established DOB: 1940/03/19 MRN: [id number] PCP: Olin Hauser, DO  Service: Procedure DOS: 03/12/2022 Setting: Ambulatory Location: Ambulatory outpatient facility Delivery: Face-to-face Provider: Gaspar Cola, MD Specialty: Interventional Pain Management Specialty designation: 09 Location: Outpatient facility Ref. Prov.: Milinda Pointer, MD    Primary Reason for Visit: Interventional Pain Management Treatment. CC: No chief complaint on file.   Procedure:           Type: Lumbar epidural steroid injection (LESI) (interlaminar) #1    Laterality: Right   Level:  L4-5 Level.  Imaging: Fluoroscopic guidance         Anesthesia: Local anesthesia (1-2% Lidocaine) Anxiolysis: None                 Sedation:                         DOS: 03/12/2022  Performed by: Gaspar Cola, MD  Purpose: Diagnostic/Therapeutic Indications: Lumbar radicular pain of intraspinal etiology of more than 4 weeks that has failed to respond to conservative therapy and is severe enough to impact quality of life or function. No diagnosis found. NAS-11 Pain score:   Pre-procedure:  /10   Post-procedure:  /10      Position / Prep / Materials:  Position: Prone w/ head of the table raised (slight reverse trendelenburg) to facilitate breathing.  Prep solution: DuraPrep (Iodine Povacrylex [0.7% available iodine] and Isopropyl Alcohol, 74% w/w) Prep Area: Entire Posterior Lumbar Region from lower scapular tip down to mid buttocks area and from flank to flank. Materials:  Tray: Epidural tray Needle(s):   Type: Epidural needle (Tuohy) Gauge (G):  17 Length: Regular (3.5-in) Qty: 1  Pre-op H&P Assessment:  Charles Ewing is a 82 y.o. (year old), male patient, seen today for interventional treatment. He  has a past surgical history that includes Skin cancer excision; Colon polyp surgery; Transurethral resection of prostate (2010); Cataract extraction w/PHACO (Left, 05/09/2020); and Cataract extraction w/PHACO (Right, 05/23/2020). Charles Ewing has a current medication list which includes the following prescription(s): albuterol, amlodipine, amoxicillin-clavulanate, aspirin ec, ciprofloxacin-dexamethasone, clotrimazole-betamethasone, cyanocobalamin, fluoxetine, fluticasone, furosemide, gabapentin, lorazepam, losartan, meloxicam, naloxone, oxycodone, [START ON 03/31/2022] oxycodone, [START ON 04/30/2022] oxycodone, pantoprazole, potassium chloride, simvastatin, spiriva respimat, tamsulosin, and terbinafine. His primarily concern today is the No chief complaint on file.  Initial Vital Signs:  Pulse/HCG Rate:    Temp:   Resp:   BP:   SpO2:    BMI: Estimated body mass index is 26.99 kg/m as calculated from the following:   Height as of 02/27/22: 6' (1.829 m).   Weight as of 02/27/22: 199 lb (90.3 kg).  Risk Assessment: Allergies: Reviewed. He is allergic to nexium [esomeprazole magnesium].  Allergy Precautions: None required Coagulopathies: Reviewed. None identified.  Blood-thinner therapy: None at this time Active Infection(s): Reviewed. None identified. Charles Ewing is afebrile  Site Confirmation: Charles Ewing was asked to confirm the procedure and laterality before marking the site Procedure checklist: Completed Consent: Before the procedure and under the influence of no sedative(s), amnesic(s), or anxiolytics, the patient was informed of the treatment options, risks and possible complications. To fulfill our ethical and legal obligations, as recommended by the  American Medical Association's Code of Ethics, I  have informed the patient of my clinical impression; the nature and purpose of the treatment or procedure; the risks, benefits, and possible complications of the intervention; the alternatives, including doing nothing; the risk(s) and benefit(s) of the alternative treatment(s) or procedure(s); and the risk(s) and benefit(s) of doing nothing. The patient was provided information about the general risks and possible complications associated with the procedure. These may include, but are not limited to: failure to achieve desired goals, infection, bleeding, organ or nerve damage, allergic reactions, paralysis, and death. In addition, the patient was informed of those risks and complications associated to Spine-related procedures, such as failure to decrease pain; infection (i.e.: Meningitis, epidural or intraspinal abscess); bleeding (i.e.: epidural hematoma, subarachnoid hemorrhage, or any other type of intraspinal or peri-dural bleeding); organ or nerve damage (i.e.: Any type of peripheral nerve, nerve root, or spinal cord injury) with subsequent damage to sensory, motor, and/or autonomic systems, resulting in permanent pain, numbness, and/or weakness of one or several areas of the body; allergic reactions; (i.e.: anaphylactic reaction); and/or death. Furthermore, the patient was informed of those risks and complications associated with the medications. These include, but are not limited to: allergic reactions (i.e.: anaphylactic or anaphylactoid reaction(s)); adrenal axis suppression; blood sugar elevation that in diabetics may result in ketoacidosis or comma; water retention that in patients with history of congestive heart failure may result in shortness of breath, pulmonary edema, and decompensation with resultant heart failure; weight gain; swelling or edema; medication-induced neural toxicity; particulate matter embolism and blood vessel occlusion with resultant organ, and/or nervous system infarction;  and/or aseptic necrosis of one or more joints. Finally, the patient was informed that Medicine is not an exact science; therefore, there is also the possibility of unforeseen or unpredictable risks and/or possible complications that may result in a catastrophic outcome. The patient indicated having understood very clearly. We have given the patient no guarantees and we have made no promises. Enough time was given to the patient to ask questions, all of which were answered to the patient's satisfaction. Mr. Schwinn has indicated that he wanted to continue with the procedure. Attestation: I, the ordering provider, attest that I have discussed with the patient the benefits, risks, side-effects, alternatives, likelihood of achieving goals, and potential problems during recovery for the procedure that I have provided informed consent. Date  Time: {CHL [facility]-PAIN TIME CHOICES:[id number]}  Pre-Procedure Preparation:  Monitoring: As per clinic protocol. Respiration, ETCO2, SpO2, BP, heart rate and rhythm monitor placed and checked for adequate function Safety Precautions: Patient was assessed for positional comfort and pressure points before starting the procedure. Time-out: I initiated and conducted the "Time-out" before starting the procedure, as per protocol. The patient was asked to participate by confirming the accuracy of the "Time Out" information. Verification of the correct person, site, and procedure were performed and confirmed by me, the nursing staff, and the patient. "Time-out" conducted as per Joint Commission's Universal Protocol (UP.01.01.01). Time:    Description/Narrative of Procedure:          Target: Epidural space via interlaminar opening, initially targeting the lower laminar border of the superior vertebral body. Region: Lumbar Approach: Percutaneous paravertebral  Rationale (medical necessity): procedure needed and proper for the diagnosis and/or treatment of the patient's medical  symptoms and needs. Procedural Technique Safety Precautions: Aspiration looking for blood return was conducted prior to all injections. At no point did we inject any substances, as a needle was being advanced. No attempts  were made at seeking any paresthesias. Safe injection practices and needle disposal techniques used. Medications properly checked for expiration dates. SDV (single dose vial) medications used. Description of the Procedure: Protocol guidelines were followed. The procedure needle was introduced through the skin, ipsilateral to the reported pain, and advanced to the target area. Bone was contacted and the needle walked caudad, until the lamina was cleared. The epidural space was identified using "loss-of-resistance technique" with 2-3 ml of PF-NaCl (0.9% NSS), in a 5cc LOR glass syringe.  There were no vitals filed for this visit.  Start Time:   hrs. End Time:   hrs.  Imaging Guidance (Spinal):          Type of Imaging Technique: Fluoroscopy Guidance (Spinal) Indication(s): Assistance in needle guidance and placement for procedures requiring needle placement in or near specific anatomical locations not easily accessible without such assistance. Exposure Time: Please see nurses notes. Contrast: Before injecting any contrast, we confirmed that the patient did not have an allergy to iodine, shellfish, or radiological contrast. Once satisfactory needle placement was completed at the desired level, radiological contrast was injected. Contrast injected under live fluoroscopy. No contrast complications. See chart for type and volume of contrast used. Fluoroscopic Guidance: I was personally present during the use of fluoroscopy. "Tunnel Vision Technique" used to obtain the best possible view of the target area. Parallax error corrected before commencing the procedure. "Direction-depth-direction" technique used to introduce the needle under continuous pulsed fluoroscopy. Once target was reached,  antero-posterior, oblique, and lateral fluoroscopic projection used confirm needle placement in all planes. Images permanently stored in EMR. Interpretation: I personally interpreted the imaging intraoperatively. Adequate needle placement confirmed in multiple planes. Appropriate spread of contrast into desired area was observed. No evidence of afferent or efferent intravascular uptake. No intrathecal or subarachnoid spread observed. Permanent images saved into the patient's record.  Antibiotic Prophylaxis:   Anti-infectives (From admission, onward)    None      Indication(s): None identified  Post-operative Assessment:  Post-procedure Vital Signs:  Pulse/HCG Rate:    Temp:   Resp:   BP:   SpO2:    EBL: None  Complications: No immediate post-treatment complications observed by team, or reported by patient.  Note: The patient tolerated the entire procedure well. A repeat set of vitals were taken after the procedure and the patient was kept under observation following institutional policy, for this type of procedure. Post-procedural neurological assessment was performed, showing return to baseline, prior to discharge. The patient was provided with post-procedure discharge instructions, including a section on how to identify potential problems. Should any problems arise concerning this procedure, the patient was given instructions to immediately contact us, at any time, without hesitation. In any case, we plan to contact the patient by telephone for a follow-up status report regarding this interventional procedure.  Comments:  No additional relevant information.  Plan of Care  Orders:  No orders of the defined types were placed in this encounter.  Chronic Opioid Analgesic:  Oxycodone IR 5 mg tablet, 1 tab p.o. 2 times daily (#60) MME/day: 15 mg/day   Medications ordered for procedure: No orders of the defined types were placed in this encounter.  Medications administered: Wynell Balloon. Hockey "Bud" had no medications administered during this visit.  See the medical record for exact dosing, route, and time of administration.  Follow-up plan:   No follow-ups on file.       Interventional Therapies  Risk  Complexity Considerations:   Estimated  body mass index is 28.29 kg/m as calculated from the following:   Height as of this encounter: 5' 10.5" (1.791 m).   Weight as of this encounter: 200 lb (90.7 kg). History of: abdominal aortic aneurysm (AAA); hypertension; cardiomyopathy; coronary artery disease; dissection of abdominal aorta; orthostatic hypotension; irregular heart rhythm; chronic kidney disease; shortness of breath; tobacco abuse; COPD; GERD   Planned  Pending:   MRI of the lumbar spine Diagnostic/therapeutic right L4-5 LESI #1    Under consideration:   Diagnostic/therapeutic right L4-5 LESI #1  Diagnostic/therapeutic bilateral IA hip joint inj. #1    Completed:   Therapeutic right lumbar facet RFA x1 (10/18/2021) (100/100/50/50)  Diagnostic right lumbar facet MBB x2 (09/13/2021) (100/100/50/50)    Completed by other providers:   Diagnostic/therapeutic bilateral IA steroid knee inj. (03/15/2016) by Carlynn Spry, PA Rosanne Gutting)    Therapeutic  Palliative (PRN) options:   Therapeutic right lumbar facet MBB       Recent Visits Date Type Provider Dept  02/27/22 Office Visit Milinda Pointer, MD [facility]  Showing recent visits within past 90 days and meeting all other requirements Future Appointments Date Type Provider Dept  03/12/22 Appointment Milinda Pointer, [facility]  05/27/22 Appointment Milinda Pointer, MD [facility]  Showing future appointments within next 90 days and meeting all other requirements  Disposition: Discharge home  Discharge (Date  Time): 03/12/2022;   hrs.   Primary Care Physician: Olin Hauser, DO Location: [facility] Note by:  Gaspar Cola, MD Date: 03/12/2022; Time: 11:59 AM  Disclaimer:  Medicine is not an Chief Strategy Officer. The only guarantee in medicine is that nothing is guaranteed. It is important to note that the decision to proceed with this intervention was based on the information collected from the patient. The Data and conclusions were drawn from the patient's questionnaire, the interview, and the physical examination. Because the information was provided in large part by the patient, it cannot be guaranteed that it has not been purposely or unconsciously manipulated. Every effort has been made to obtain as much relevant data as possible for this evaluation. It is important to note that the conclusions that lead to this procedure are derived in large part from the available data. Always take into account that the treatment will also be dependent on availability of resources and existing treatment guidelines, considered by other Pain Management Practitioners as being common knowledge and practice, at the time of the intervention. For Medico-Legal purposes, it is also important to point out that variation in procedural techniques and pharmacological choices are the acceptable norm. The indications, contraindications, technique, and results of the above procedure should only be interpreted and judged by a Board-Certified Interventional Pain Specialist with extensive familiarity and expertise in the same exact procedure and technique.

## 2022-03-12 ENCOUNTER — Ambulatory Visit
Admission: RE | Admit: 2022-03-12 | Discharge: 2022-03-12 | Disposition: A | Discharge status: Home / Self Care | Payer: 59 | Source: Ambulatory Visit | Attending: Pain Medicine | Admitting: Pain Medicine

## 2022-03-12 ENCOUNTER — Ambulatory Visit: Payer: 59 | Attending: Pain Medicine | Admitting: Pain Medicine

## 2022-03-12 ENCOUNTER — Encounter: Payer: Self-pay | Admitting: Pain Medicine

## 2022-03-12 VITALS — BP 168/101 | HR 72 | Temp 97.9°F | Resp 18 | Ht 71.5 in | Wt 199.0 lb

## 2022-03-12 DIAGNOSIS — M545 Low back pain, unspecified: Secondary | ICD-10-CM | POA: Insufficient documentation

## 2022-03-12 DIAGNOSIS — M25551 Pain in right hip: Secondary | ICD-10-CM | POA: Diagnosis not present

## 2022-03-12 DIAGNOSIS — M5126 Other intervertebral disc displacement, lumbar region: Secondary | ICD-10-CM | POA: Insufficient documentation

## 2022-03-12 DIAGNOSIS — M5136 Other intervertebral disc degeneration, lumbar region: Secondary | ICD-10-CM | POA: Diagnosis not present

## 2022-03-12 DIAGNOSIS — M5441 Lumbago with sciatica, right side: Secondary | ICD-10-CM | POA: Insufficient documentation

## 2022-03-12 DIAGNOSIS — M4316 Spondylolisthesis, lumbar region: Secondary | ICD-10-CM | POA: Diagnosis not present

## 2022-03-12 DIAGNOSIS — R937 Abnormal findings on diagnostic imaging of other parts of musculoskeletal system: Secondary | ICD-10-CM | POA: Insufficient documentation

## 2022-03-12 DIAGNOSIS — G8929 Other chronic pain: Secondary | ICD-10-CM | POA: Insufficient documentation

## 2022-03-12 DIAGNOSIS — M5417 Radiculopathy, lumbosacral region: Secondary | ICD-10-CM | POA: Diagnosis not present

## 2022-03-12 DIAGNOSIS — M5137 Other intervertebral disc degeneration, lumbosacral region: Secondary | ICD-10-CM | POA: Insufficient documentation

## 2022-03-12 DIAGNOSIS — M79604 Pain in right leg: Secondary | ICD-10-CM | POA: Diagnosis not present

## 2022-03-12 DIAGNOSIS — M5410 Radiculopathy, site unspecified: Secondary | ICD-10-CM | POA: Diagnosis not present

## 2022-03-12 MED ORDER — TRIAMCINOLONE ACETONIDE 40 MG/ML IJ SUSP: 40.0000 mg | Freq: Once | INTRAMUSCULAR | Status: DC

## 2022-03-12 MED ORDER — LACTATED RINGERS IV SOLN: Freq: Once | INTRAVENOUS | Status: DC

## 2022-03-12 MED ORDER — IOHEXOL 180 MG/ML  SOLN
10.0000 mL | Freq: Once | INTRAMUSCULAR | Status: DC
  Filled 2022-03-12: qty 20

## 2022-03-12 MED ORDER — TRIAMCINOLONE ACETONIDE 40 MG/ML IJ SUSP
INTRAMUSCULAR | Status: AC
  Filled 2022-03-12: qty 1

## 2022-03-12 MED ORDER — PENTAFLUOROPROP-TETRAFLUOROETH EX AERO
INHALATION_SPRAY | Freq: Once | CUTANEOUS | Status: AC
  Administered 2022-03-12: 30 via TOPICAL
  Filled 2022-03-12: qty 116

## 2022-03-12 MED ORDER — TRIAMCINOLONE ACETONIDE 40 MG/ML IJ SUSP
40.0000 mg | Freq: Once | INTRAMUSCULAR | Status: AC
  Administered 2022-03-12: 40 mg

## 2022-03-12 MED ORDER — ROPIVACAINE HCL 2 MG/ML IJ SOLN
2.0000 mL | Freq: Once | INTRAMUSCULAR | Status: AC
  Administered 2022-03-12: 2 mL via EPIDURAL
  Filled 2022-03-12: qty 20

## 2022-03-12 MED ORDER — SODIUM CHLORIDE 0.9% FLUSH
2.0000 mL | Freq: Once | INTRAVENOUS | Status: AC
  Administered 2022-03-12: 2 mL

## 2022-03-12 MED ORDER — LIDOCAINE HCL 2 % IJ SOLN
20.0000 mL | Freq: Once | INTRAMUSCULAR | Status: AC
  Administered 2022-03-12: 100 mg

## 2022-03-12 MED ORDER — MIDAZOLAM HCL 2 MG/2ML IJ SOLN: 0.5000 mg | Freq: Once | INTRAMUSCULAR | Status: DC

## 2022-03-12 NOTE — Patient Instructions (Addendum)

## 2022-03-12 NOTE — Progress Notes (Signed)
Safety precautions to be maintained throughout the outpatient stay will include: orient to surroundings, keep bed in low position, maintain call bell within reach at all times, provide assistance with transfer out of bed and ambulation.  

## 2022-03-13 ENCOUNTER — Telehealth: Payer: Self-pay

## 2022-03-13 NOTE — Telephone Encounter (Signed)
Post procedure follow up.  Unable to leave message.  Voicemail has not been set up.

## 2022-03-22 NOTE — Progress Notes (Unsigned)
PROVIDER NOTE: Information contained herein reflects review and annotations entered in association with encounter. Interpretation of such information and data should be left to medically-trained personnel. Information provided to patient can be located elsewhere in the medical record under "Patient Instructions". Document created using STT-dictation technology, any transcriptional errors that may result from process are unintentional.    Patient: Charles Ewing  Service Category: E/M  Provider: Gaspar Cola, MD  DOB: 02-10-1941  DOS: 03/26/2022  Referring Provider: [name] *  MRN: [id number]  Specialty: Interventional Pain Management  PCP: Olin Hauser, DO  Type: Established Patient  Setting: Ambulatory outpatient    Location: Office  Delivery: Face-to-face     HPI  [name], a 82 y.o. year old male, is here today because of his No primary diagnosis found.. Charles Ewing primary complain today is No chief complaint on file. Last encounter: My last encounter with him was on 03/12/2022. Pertinent problems: Charles Ewing has Osteoarthritis of knees (Bilateral); Bilateral lower extremity edema; Chronic pain syndrome; Chronic hip pain (2ry area of Pain) (Right); Chronic lower extremity pain (3ry area of Pain) (Right); Chronic low back pain (1ry area of Pain) (Bilateral) (R>L) w/o sciatica; Lumbar facet syndrome; Dextroscoliosis of lumbar spine; DDD (degenerative disc disease), lumbosacral; Osteopenia of lumbar spine; Osteopenia determined by x-ray; Osteoarthritis of sacroiliac joints (HCC) (Bilateral); Osteoarthritis of hips (Bilateral); Chronic sacroiliac joint pain (Bilateral); Sacroiliac joint dysfunction (Bilateral); Spondylosis without myelopathy or radiculopathy, lumbosacral region; Other spondylosis, sacral and sacrococcygeal region; Abnormal MRI, lumbar spine (03/11/2022); Chronic low back pain (Bilateral) w/ sciatica (Right); Herniated nucleus pulposus, L4-5 (Right);  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 Pain Assessment: Severity of   is reported as a  /10. Location:    / . Onset:  . Quality:  . Timing:  . Modifying factor(s):  Marland Kitchen Vitals:  vitals were not taken for this visit.  BMI: Estimated body mass index is 27.37 kg/m as calculated from the following:   Height as of 03/12/22: 5' 11.5" (1.816 m).   Weight as of 03/12/22: 199 lb (90.3 kg).  Reason for encounter: post-procedure evaluation and assessment. ***  Post-procedure evaluation   Type: Lumbar epidural steroid injection (LESI) (interlaminar) #1    Laterality: Right   Level:  L4-5 Level.  Imaging: Fluoroscopic guidance         Anesthesia: Local anesthesia (1-2% Lidocaine) Anxiolysis: None                 Sedation:                         DOS: 03/12/2022  Performed by: Gaspar Cola, MD  Purpose: Diagnostic/Therapeutic Indications: Lumbar radicular pain of intraspinal etiology of more than 4 weeks that has failed to respond to conservative therapy and is severe enough to impact quality of life or function. 1. Lumbosacral radiculopathy at L4 (Right)   2. Chronic radicular pain of lower extremity (Right)   3. Chronic lower extremity pain (3ry area of Pain) (Right)   4. Herniated nucleus pulposus, L4-5 (Right)   5. L4-5 disc bulge (Right)   6. Spondylolisthesis at L4-L5 level   7. 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 Chronic low back pain (Bilateral) w/ sciatica (Right)   9. Abnormal MRI, lumbar spine (03/11/2022)  10. DDD (degenerative disc disease), lumbosacral   11. Chronic hip pain (2ry area of Pain) (Right)   12. Chronic low back pain (1ry area of Pain) (Bilateral) (R>L) w/o sciatica    NAS-11 Pain score:   Pre-procedure: 8 /10   Post-procedure: 8 /10       Effectiveness:  Initial hour after procedure:   ***. Subsequent 4-6 hours post-procedure:   ***. Analgesia past initial 6 hours:   ***. Ongoing improvement:  Analgesic:  *** Function:    ***    ROM:    ***     Pharmacotherapy Assessment  Analgesic: Oxycodone IR 5 mg tablet, 1 tab p.o. 2 times daily (#60) MME/day: 15 mg/day   Monitoring: [place] PMP: PDMP reviewed during this encounter.       Pharmacotherapy: No side-effects or adverse reactions reported. Compliance: No problems identified. Effectiveness: Clinically acceptable.  No notes on file  No results found for: "CBDTHCR" No results found for: "D8THCCBX" No results found for: "D9THCCBX"  UDS:  Summary  Date Value Ref Range Status  06/14/2021 Note  Final    Comment:    ==================================================================== Compliance Drug Analysis, Ur ==================================================================== Test                             Result       Flag       Units  Drug Present and Declared for Prescription Verification   Lorazepam                      200          EXPECTED   ng/mg creat    Source of lorazepam is a scheduled prescription medication.    Oxycodone                      1671         EXPECTED   ng/mg creat   Noroxycodone                   2522         EXPECTED   ng/mg creat    Sources of oxycodone include scheduled prescription medications.    Noroxycodone is an expected metabolite of oxycodone.    Gabapentin                     PRESENT      EXPECTED   Fluoxetine                     PRESENT      EXPECTED   Norfluoxetine                  PRESENT      EXPECTED    Norfluoxetine is an expected metabolite of fluoxetine.  Drug Absent but Declared for Prescription Verification   Salicylate                     Not Detected UNEXPECTED    Aspirin, as indicated in the declared medication list, is not always    detected even when used as directed.    Metoprolol                      Not Detected UNEXPECTED ==================================================================== Test  Result    Flag   Units      Ref Range   Creatinine              41               mg/dL      >=20 ==================================================================== Declared Medications:  The flagging and interpretation on this report are based on the  following declared medications.  Unexpected results may arise from  inaccuracies in the declared medications.   **Note: The testing scope of this panel includes these medications:   Fluoxetine (Prozac)  Gabapentin (Neurontin)  Lorazepam (Ativan)  Metoprolol (Toprol)  Oxycodone (Roxicodone)   **Note: The testing scope of this panel does not include small to  moderate amounts of these reported medications:   Aspirin   **Note: The testing scope of this panel does not include the  following reported medications:   Albuterol (Ventolin HFA)  Amlodipine (Norvasc)  Fluticasone (Flonase)  Furosemide (Lasix)  Losartan (Cozaar)  Meloxicam (Mobic)  Mupirocin (Bactroban)  Nystatin  Pantoprazole (Protonix)  Potassium Chloride  Simvastatin (Zocor)  Sucralfate (Carafate)  Tamsulosin (Flomax) ==================================================================== For clinical consultation, please call [phone]. ====================================================================       ROS  Constitutional: Denies any fever or chills Gastrointestinal: No reported hemesis, hematochezia, vomiting, or acute GI distress Musculoskeletal: Denies any acute onset joint swelling, redness, loss of ROM, or weakness Neurological: No reported episodes of acute onset apraxia, aphasia, dysarthria, agnosia, amnesia, paralysis, loss of coordination, or loss of consciousness  Medication Review  FLUoxetine, LORazepam, Tiotropium Bromide Monohydrate, albuterol, amLODipine, amoxicillin-clavulanate, aspirin EC,  ciprofloxacin-dexamethasone, clotrimazole-betamethasone, cyanocobalamin, fluticasone, furosemide, gabapentin, losartan, meloxicam, naloxone, oxyCODONE, pantoprazole, potassium chloride, simvastatin, tamsulosin, and terbinafine  History Review  Allergy: Charles Ewing is allergic to nexium [esomeprazole magnesium]. Drug: Charles Ewing  reports no history of drug use. Alcohol:  reports no history of alcohol use. Tobacco:  reports that he has been smoking cigarettes. He has a 70.00 pack-year smoking history. He has never used smokeless tobacco. Social: Charles Ewing  reports that he has been smoking cigarettes. He has a 70.00 pack-year smoking history. He has never used smokeless tobacco. He reports that he does not drink alcohol and does not use drugs. Medical:  has a past medical history of Abdominal aortic aneurysm (AAA) ([place]) (2013), Anxiety, Arrhythmia, Arthritis, Colon polyp, COPD (chronic obstructive pulmonary disease) ([place]), Coronary artery disease, Depression, GERD (gastroesophageal reflux disease), Hyperlipidemia, Hypertension, Skin cancer, and Thrush. Surgical: Charles Ewing  has a past surgical history that includes Skin cancer excision; Colon polyp surgery; Transurethral resection of prostate (2010); Cataract extraction w/PHACO (Left, 05/09/2020); and Cataract extraction w/PHACO (Right, 05/23/2020). Family: family history includes Kidney cancer in his mother.  Laboratory Chemistry Profile   Renal Lab Results  Component Value Date   BUN 18 06/13/2021   CREATININE 1.18 06/13/2021   BCR 15 06/13/2021   GFRAA 64 01/11/2019   GFRNONAA >60 12/23/2020    Hepatic Lab Results  Component Value Date   AST 13 06/13/2021   ALT 11 10/30/2020   ALBUMIN 3.9 06/13/2021   ALKPHOS 70 06/13/2021    Electrolytes Lab Results  Component Value Date   NA 138 06/13/2021   K 4.6 06/13/2021   CL 102 06/13/2021   CALCIUM 8.8 06/13/2021   MG 2.3 06/13/2021    Bone Lab Results  Component Value Date   25OHVITD1 14  (L) 06/13/2021   25OHVITD2 1.8 06/13/2021   25OHVITD3 12 06/13/2021    Inflammation (CRP: Acute Phase) (ESR:  Chronic Phase) Lab Results  Component Value Date   CRP 1 06/13/2021   ESRSEDRATE 20 06/13/2021         Note: Above Lab results reviewed.  Recent Imaging Review  DG PAIN CLINIC C-ARM 1-60 MIN NO REPORT Fluoro was used, but no Radiologist interpretation will be provided.  Please refer to "NOTES" tab for provider progress note. Note: Reviewed        Physical Exam  General appearance: Well nourished, well developed, and well hydrated. In no apparent acute distress Mental status: Alert, oriented x 3 (person, place, & time)       Respiratory: No evidence of acute respiratory distress Eyes: PERLA Vitals: There were no vitals taken for this visit. BMI: Estimated body mass index is 27.37 kg/m as calculated from the following:   Height as of 03/12/22: 5' 11.5" (1.816 m).   Weight as of 03/12/22: 199 lb (90.3 kg). Ideal: Ideal body weight: 76.5 kg (168 lb 8.7 oz) Adjusted ideal body weight: 82 kg (180 lb 11.6 oz)  Assessment   Diagnosis Status  No diagnosis found. Controlled Controlled Controlled   Updated Problems: No problems updated.  Plan of Care  Problem-specific:  No problem-specific Assessment & Plan notes found for this encounter.  Charles Ewing has a current medication list which includes the following long-term medication(s): albuterol, amlodipine, fluoxetine, fluticasone, furosemide, gabapentin, losartan, naloxone, oxycodone, [START ON 03/31/2022] oxycodone, [START ON 04/30/2022] oxycodone, potassium chloride, simvastatin, and spiriva respimat.  Pharmacotherapy (Medications Ordered): No orders of the defined types were placed in this encounter.  Orders:  No orders of the defined types were placed in this encounter.  Follow-up plan:   No follow-ups on file.     Interventional Therapies  Risk Factors  Considerations:   Abdominal aortic aneurysm (AAA)   HTN  Cardiomyopathy  CAD  orthostatic hypotension  Irregular heart rhythm CKD  SOB  Tobacco abuse  COPD  GERD   Planned  Pending:      Under consideration:   Diagnostic/therapeutic right L4-5 LESI #2  Diagnostic/therapeutic bilateral IA hip joint inj. #1    Completed:   Diagnostic/therapeutic right L4-5 LESI x1 (03/12/2022)  Therapeutic right lumbar facet RFA x1 (10/18/2021) (100/100/50/50)  Diagnostic right lumbar facet MBB x2 (09/13/2021) (100/100/50/50)    Completed by other providers:   Diagnostic/therapeutic bilateral IA steroid knee inj. (03/15/2016) by Carlynn Spry, PA ([email or web address])    Therapeutic  Palliative (PRN) options:   Therapeutic right lumbar facet MBB       Recent Visits Date Type Provider Dept  03/12/22 Procedure visit Milinda Pointer, MD [facility]  02/27/22 Office Visit Milinda Pointer, MD [facility]  Showing recent visits within past 90 days and meeting all other requirements Future Appointments Date Type Provider Dept  03/26/22 Appointment Milinda Pointer, [facility]  05/27/22 Appointment Milinda Pointer, MD [facility]  Showing future appointments within next 90 days and meeting all other requirements  I discussed the assessment and treatment plan with the patient. The patient was provided an opportunity to ask questions and all were answered. The patient agreed with the plan and demonstrated an understanding of the instructions.  Patient advised to call back or seek an in-person evaluation if the symptoms or condition worsens.  Duration of encounter: *** minutes.  Total time on encounter, as per AMA guidelines included both the face-to-face and non-face-to-face time personally spent by the physician and/or other qualified health care professional(s) on the day of the encounter (  includes time in activities that require the physician or other qualified health care professional and does not  include time in activities normally performed by clinical staff). Physician's time may include the following activities when performed: Preparing to see the patient (e.g., pre-charting review of records, searching for previously ordered imaging, lab work, and nerve conduction tests) Review of prior analgesic pharmacotherapies. Reviewing PMP Interpreting ordered tests (e.g., lab work, imaging, nerve conduction tests) Performing post-procedure evaluations, including interpretation of diagnostic procedures Obtaining and/or reviewing separately obtained history Performing a medically appropriate examination and/or evaluation Counseling and educating the patient/family/caregiver Ordering medications, tests, or procedures Referring and communicating with other health care professionals (when not separately reported) Documenting clinical information in the electronic or other health record Independently interpreting results (not separately reported) and communicating results to the patient/ family/caregiver Care coordination (not separately reported)  Note by: Gaspar Cola, MD Date: 03/26/2022; Time: 11:24 AM

## 2022-03-26 ENCOUNTER — Encounter: Payer: Self-pay | Admitting: Pain Medicine

## 2022-03-26 ENCOUNTER — Ambulatory Visit: Payer: 59 | Attending: Pain Medicine | Admitting: Pain Medicine

## 2022-03-26 VITALS — BP 119/76 | HR 67 | Temp 97.6°F | Resp 16 | Ht 71.0 in | Wt 190.0 lb

## 2022-03-26 DIAGNOSIS — M5410 Radiculopathy, site unspecified: Secondary | ICD-10-CM | POA: Diagnosis not present

## 2022-03-26 DIAGNOSIS — G8929 Other chronic pain: Secondary | ICD-10-CM | POA: Diagnosis not present

## 2022-03-26 DIAGNOSIS — R937 Abnormal findings on diagnostic imaging of other parts of musculoskeletal system: Secondary | ICD-10-CM | POA: Insufficient documentation

## 2022-03-26 DIAGNOSIS — M25551 Pain in right hip: Secondary | ICD-10-CM | POA: Diagnosis not present

## 2022-03-26 DIAGNOSIS — M5417 Radiculopathy, lumbosacral region: Secondary | ICD-10-CM | POA: Insufficient documentation

## 2022-03-26 DIAGNOSIS — M5441 Lumbago with sciatica, right side: Secondary | ICD-10-CM | POA: Insufficient documentation

## 2022-03-26 DIAGNOSIS — M79604 Pain in right leg: Secondary | ICD-10-CM | POA: Insufficient documentation

## 2022-04-08 NOTE — Progress Notes (Signed)
Referring Physician:  Milinda Pointer, MD [facility] [address],  [place] [address]  Primary Physician:  Charles Hauser, DO  History of Present Illness: 04/09/2022 Charles Ewing is here today with a chief complaint of back and leg pain.  This has been ongoing for 2 years but worsening more recently.  His pain is as bad as 10 out of 10 and made worse by walking and standing.  He did have an injection recently which has helped.  He has tried medication management which has helped a small amount.   Bowel/Bladder Dysfunction: none  Conservative measures:  Physical therapy:  has not participated  Multimodal medical therapy including regular antiinflammatories:  gabapentin, meloxicam, oxycodone  Injections:  has received epidural steroid injections by Dr. Dossie Ewing  03/12/22: Right L4-5 IL ESI 10/18/21: Right L2, L3, L4, L5, S1 Radiofrequency Ablation 09/13/21: Right L2, L3, L4, L5, & S1 Medial Branch Block 08/09/21: Bilateral L2, L3, L4, L5, & S1 Medial Branch Block and a Bilateral SI joint injection  Past Surgery: denies  Charles Ewing has no symptoms of cervical myelopathy.  The symptoms are causing a significant impact on the patient's life.   I have utilized the care everywhere function in epic to review the outside records available from external health systems.  Review of Systems:  A 10 point review of systems is negative, except for the pertinent positives and negatives detailed in the HPI.  Past Medical History: Past Medical History:  Diagnosis Date   Abdominal aortic aneurysm (AAA) ([place]) 2013   found at [facility]-   Anxiety    Arrhythmia    Arthritis    Colon polyp    COPD (chronic obstructive pulmonary disease) ([place])    Coronary artery disease    Depression    GERD (gastroesophageal reflux disease)    Hyperlipidemia    Hypertension    Skin cancer    Thrush     Past Surgical History: Past Surgical History:  Procedure Laterality Date    CATARACT EXTRACTION W/PHACO Left 05/09/2020   Procedure: CATARACT EXTRACTION PHACO AND INTRAOCULAR LENS PLACEMENT (IOC) LEFT DIABETIC 9.09 01:02.0;  Surgeon: Birder Robson, MD;  Location: [facility];  Service: Ophthalmology;  Laterality: Left;   CATARACT EXTRACTION W/PHACO Right 05/23/2020   Procedure: CATARACT EXTRACTION PHACO AND INTRAOCULAR LENS PLACEMENT (IOC) RIGHT DIABETIC 8.13 01:00.3;  Surgeon: Birder Robson, MD;  Location: [facility];  Service: Ophthalmology;  Laterality: Right;   Colon polyp surgery     SKIN CANCER EXCISION     TRANSURETHRAL RESECTION OF PROSTATE  2010    Allergies: Allergies as of 04/09/2022 - Review Complete 04/09/2022  Allergen Reaction Noted   Nexium [esomeprazole magnesium]  12/22/2020    Medications: Current Meds  Medication Sig   albuterol (VENTOLIN HFA) 108 (90 Base) MCG/ACT inhaler Inhale 2 puffs into the lungs every 6 (six) hours as needed for wheezing or shortness of breath.   amLODipine (NORVASC) 5 MG tablet TAKE 1 TABLET BY MOUTH DAILY   aspirin EC 81 MG tablet Take 162 mg by mouth every evening.   cyanocobalamin (VITAMIN B12) 1000 MCG tablet Take 1,000 mcg by mouth daily.   FLUoxetine (PROZAC) 20 MG capsule TAKE ONE CAPSULE BY MOUTH TWICE A DAY   fluticasone (FLONASE) 50 MCG/ACT nasal spray PLACE 2 SPRAYS INTO BOTH NOSTRILS DAILY   furosemide (LASIX) 20 MG tablet TAKE 1 TABLET BY MOUTH EVERY OTHER DAY   LORazepam (ATIVAN) 0.5 MG tablet TAKE 1  TABLET BY MOUTH AT BEDTIME AS NEEDED FOR ANXIETY   losartan (COZAAR) 25 MG tablet TAKE 1 TABLET BY MOUTH DAILY   meloxicam (MOBIC) 15 MG tablet TAKE 1 TABLET BY MOUTH DAILY AS NEEDED FOR PAIN   oxyCODONE (OXY IR/ROXICODONE) 5 MG immediate release tablet Take 1 tablet (5 mg total) by mouth 2 (two) times daily as needed for severe pain. Each refill must last 30 days.   [START ON 04/30/2022] oxyCODONE (OXY IR/ROXICODONE) 5 MG immediate release tablet Take 1 tablet (5 mg total) by mouth 2  (two) times daily as needed for severe pain. Each refill must last 30 days.   pantoprazole (PROTONIX) 20 MG tablet Take 1 tablet by mouth daily.   potassium chloride (MICRO-K) 10 MEQ CR capsule TAKE 1 CAPSULE BY MOUTH EVERY OTHER DAY   simvastatin (ZOCOR) 20 MG tablet TAKE ONE TABLET BY MOUTH AT BEDTIME   SPIRIVA RESPIMAT 2.5 MCG/ACT AERS INHALE 2 PUFFS BY MOUTH INTO THE LUNGS DAILY   tamsulosin (FLOMAX) 0.4 MG CAPS capsule TAKE 1 CAPSULE BY MOUTH DAILY AFTER BREAKFAST    Social History: Social History   Tobacco Use   Smoking status: Every Day    Packs/day: 1.00    Years: 70.00    Total pack years: 70.00    Types: Cigarettes   Smokeless tobacco: Never  Vaping Use   Vaping Use: Never used  Substance Use Topics   Alcohol use: No   Drug use: No    Family Medical History: Family History  Problem Relation Age of Onset   Kidney cancer Mother    Prostate cancer Neg Hx    Bladder Cancer Neg Hx     Physical Examination: Vitals:   04/09/22 0850  BP: 110/62  Pulse: 75  SpO2: 97%    General: Patient is well developed, well nourished, calm, collected, and in no apparent distress. Attention to examination is appropriate.  Neck:   Supple.  Full range of motion.  Respiratory: Patient is breathing without any difficulty.   NEUROLOGICAL:     Awake, alert, oriented to person, place, and time.  Speech is clear and fluent.   Cranial Nerves: Pupils equal round and reactive to light.  Facial tone is symmetric.  Facial sensation is symmetric. Shoulder shrug is symmetric. Tongue protrusion is midline.  There is no pronator drift.  ROM of spine: full.    Strength: Side Biceps Triceps Deltoid Interossei Grip Wrist Ext. Wrist Flex.  R '5 5 5 5 5 5 5  '$ L '5 5 5 5 5 5 5   '$ Side Iliopsoas Quads Hamstring PF DF EHL  R '5 5 5 5 5 5  '$ L '5 5 5 5 5 5   '$ Reflexes are 1+ and symmetric at the biceps, triceps, brachioradialis, patella and achilles.   Hoffman's is absent.   Bilateral upper and  lower extremity sensation is intact to light touch.    No evidence of dysmetria noted.  Gait is abnormal-he walks with a cane.     Medical Decision Making  Imaging: MRI L spine 03/08/2022 IMPRESSION: Lumbar spine degeneration which is focally severe at L4-5 where there is degenerative cyst and superior migrating disc extrusion. Advanced spinal stenosis and right L4 impingement at this level.     Electronically Signed   By: Jorje Guild M.D.   On: 03/11/2022 06:38  I have personally reviewed the images and agree with the above interpretation.  Assessment and Plan: Charles Ewing is a pleasant 82 y.o. 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  He has back pain from this as well.  He has not tried physical therapy.  I think it is worthwhile for him to try some physical therapy to see if that will help alleviate some of his symptoms.  Injections helped him significantly, so he may be able to control his symptoms with physical therapy plus injections.  I will see him back in 6 to 8 weeks to reevaluate.  At that time, if he is not better, we will discuss L4-5 decompression versus a more extensive intervention.  I would likely favor an L4-5 decompression as a for start should he need surgical intervention.  I spent a total of 30 minutes in this patient's care today. This time was spent reviewing pertinent records including imaging studies, obtaining and confirming history, performing a directed evaluation, formulating and discussing my recommendations, and documenting the visit within the medical record.    Thank you for involving me in the care of this patient.      Charles Allen K. Izora Ribas MD, [facility] Neurosurgery

## 2022-04-09 ENCOUNTER — Ambulatory Visit (INDEPENDENT_AMBULATORY_CARE_PROVIDER_SITE_OTHER): Payer: 59 | Admitting: Neurosurgery

## 2022-04-09 ENCOUNTER — Encounter: Payer: Self-pay | Admitting: Neurosurgery

## 2022-04-09 VITALS — BP 110/62 | HR 75 | Ht 71.55 in | Wt 201.6 lb

## 2022-04-09 DIAGNOSIS — M4310 Spondylolisthesis, site unspecified: Secondary | ICD-10-CM | POA: Diagnosis not present

## 2022-04-09 DIAGNOSIS — M48062 Spinal stenosis, lumbar region with neurogenic claudication: Secondary | ICD-10-CM

## 2022-04-09 DIAGNOSIS — M5441 Lumbago with sciatica, right side: Secondary | ICD-10-CM

## 2022-04-09 DIAGNOSIS — G8929 Other chronic pain: Secondary | ICD-10-CM

## 2022-04-09 DIAGNOSIS — M4316 Spondylolisthesis, lumbar region: Secondary | ICD-10-CM

## 2022-04-24 DIAGNOSIS — M5416 Radiculopathy, lumbar region: Secondary | ICD-10-CM | POA: Diagnosis not present

## 2022-04-26 ENCOUNTER — Other Ambulatory Visit: Payer: Self-pay

## 2022-04-26 DIAGNOSIS — I129 Hypertensive chronic kidney disease with stage 1 through stage 4 chronic kidney disease, or unspecified chronic kidney disease: Secondary | ICD-10-CM

## 2022-04-26 DIAGNOSIS — R600 Localized edema: Secondary | ICD-10-CM

## 2022-04-26 MED ORDER — FUROSEMIDE 20 MG PO TABS: 20.0000 mg | ORAL_TABLET | ORAL | 0 refills | Status: DC

## 2022-04-29 DIAGNOSIS — M5416 Radiculopathy, lumbar region: Secondary | ICD-10-CM | POA: Diagnosis not present

## 2022-05-02 DIAGNOSIS — M5416 Radiculopathy, lumbar region: Secondary | ICD-10-CM | POA: Diagnosis not present

## 2022-05-03 ENCOUNTER — Other Ambulatory Visit: Payer: Self-pay | Admitting: Family Medicine

## 2022-05-03 DIAGNOSIS — I10 Essential (primary) hypertension: Secondary | ICD-10-CM

## 2022-05-03 DIAGNOSIS — R809 Proteinuria, unspecified: Secondary | ICD-10-CM

## 2022-05-06 DIAGNOSIS — M5416 Radiculopathy, lumbar region: Secondary | ICD-10-CM | POA: Diagnosis not present

## 2022-05-06 NOTE — Telephone Encounter (Signed)
Patient will need an office visit for further refills. Requested Prescriptions  Pending Prescriptions Disp Refills   losartan (COZAAR) 25 MG tablet [Pharmacy Med Name: LOSARTAN POTASSIUM 25 MG TAB] 90 tablet 0    Sig: TAKE 1 TABLET BY MOUTH DAILY     Cardiovascular:  Angiotensin Receptor Blockers Failed - 05/03/2022  1:22 PM      Failed - Cr in normal range and within 180 days    Creat  Date Value Ref Range Status  10/30/2020 0.96 0.70 - 1.22 mg/dL Final   Creatinine, Ser  Date Value Ref Range Status  06/13/2021 1.18 0.76 - 1.27 mg/dL Final         Failed - K in normal range and within 180 days    Potassium  Date Value Ref Range Status  06/13/2021 4.6 3.5 - 5.2 mmol/L Final  10/16/2013 3.8 3.5 - 5.1 mmol/L Final         Passed - Patient is not pregnant      Passed - Last BP in normal range    BP Readings from Last 1 Encounters:  04/09/22 110/62         Passed - Valid encounter within last 6 months    Recent Outpatient Visits           5 months ago Tinea pedis of right foot   [place], DO   9 months ago Benign hypertension with CKD (chronic kidney disease) stage III [facility])   [place], DO   1 year ago Chronic right-sided low back pain with bilateral sciatica   [place], DO   1 year ago Acute intractable headache, unspecified headache type   [facility] [place], Coralie Keens, NP   1 year ago Annual physical exam   [facility] [place], Devonne Doughty, [place]

## 2022-05-08 DIAGNOSIS — M5416 Radiculopathy, lumbar region: Secondary | ICD-10-CM | POA: Diagnosis not present

## 2022-05-13 DIAGNOSIS — M5416 Radiculopathy, lumbar region: Secondary | ICD-10-CM | POA: Diagnosis not present

## 2022-05-14 ENCOUNTER — Other Ambulatory Visit: Payer: Self-pay | Admitting: Family Medicine

## 2022-05-14 DIAGNOSIS — G8929 Other chronic pain: Secondary | ICD-10-CM

## 2022-05-15 DIAGNOSIS — M5416 Radiculopathy, lumbar region: Secondary | ICD-10-CM | POA: Diagnosis not present

## 2022-05-15 NOTE — Telephone Encounter (Signed)
Requested medication (s) are due for refill today: yes  Requested medication (s) are on the active medication list: yes  Last refill:  02/13/22  Future visit scheduled: yes  Notes to clinic:  Unable to refill per protocol due to failed labs, no updated results.      Requested Prescriptions  Pending Prescriptions Disp Refills   meloxicam (MOBIC) 15 MG tablet [Pharmacy Med Name: MELOXICAM 15 MG TAB] 90 tablet 0    Sig: TAKE 1 TABLET BY MOUTH DAILY AS NEEDED FOR PAIN     Analgesics:  COX2 Inhibitors Failed - 05/14/2022  6:07 PM      Failed - Manual Review: Labs are only required if the patient has taken medication for more than 8 weeks.      Failed - HGB in normal range and within 360 days    Hemoglobin  Date Value Ref Range Status  12/23/2020 13.5 13.0 - 17.0 g/dL Final  05/19/2015 14.6 12.6 - 17.7 g/dL Final         Failed - HCT in normal range and within 360 days    HCT  Date Value Ref Range Status  12/23/2020 40.6 39.0 - 52.0 % Final   Hematocrit  Date Value Ref Range Status  05/19/2015 44.5 37.5 - 51.0 % Final         Failed - ALT in normal range and within 360 days    ALT  Date Value Ref Range Status  10/30/2020 11 9 - 46 U/L Final   SGPT (ALT)  Date Value Ref Range Status  10/15/2013 27 U/L Final    Comment:    14-63 NOTE: New Reference Range 08/31/13          Passed - Cr in normal range and within 360 days    Creat  Date Value Ref Range Status  10/30/2020 0.96 0.70 - 1.22 mg/dL Final   Creatinine, Ser  Date Value Ref Range Status  06/13/2021 1.18 0.76 - 1.27 mg/dL Final         Passed - AST in normal range and within 360 days    AST  Date Value Ref Range Status  06/13/2021 13 0 - 40 IU/L Final   SGOT(AST)  Date Value Ref Range Status  10/15/2013 15 15 - 37 Unit/L Final         Passed - eGFR is 30 or above and within 360 days    GFR, Est African American  Date Value Ref Range Status  01/11/2019 64 > OR = 60 mL/min/1.40m2 Final   GFR, Est  Non African American  Date Value Ref Range Status  01/11/2019 55 (L) > OR = 60 mL/min/1.109m2 Final   GFR, Estimated  Date Value Ref Range Status  12/23/2020 >60 >60 mL/min Final    Comment:    (NOTE) Calculated using the CKD-EPI Creatinine Equation (2021)    eGFR  Date Value Ref Range Status  06/13/2021 62 >59 mL/min/1.73 Final         Passed - Patient is not pregnant      Passed - Valid encounter within last 12 months    Recent Outpatient Visits           5 months ago Tinea pedis of right foot   [place], DO   9 months ago Benign hypertension with CKD (chronic kidney disease) stage III [facility])   [facility] Olin Hauser, DO   1 year  ago Chronic right-sided low back pain with bilateral sciatica   [place], DO   1 year ago Acute intractable headache, unspecified headache type   [facility] [place], Coralie Keens, NP   1 year ago Annual physical exam   [facility] [place], Devonne Doughty, [place]

## 2022-05-17 ENCOUNTER — Telehealth: Payer: Self-pay | Admitting: Pharmacist

## 2022-05-17 NOTE — Telephone Encounter (Signed)
   Outreach Note  05/17/2022 Name: GAUTHAM ROOSEVELT MRN: [id number] DOB: Aug 17, 1940   Outreach to patient today regarding cholesterol medication adherence as requested by patient's health plan.  Was unable to reach patient via telephone today and unable to leave a message as patient does not have a voicemail setup.  No next appointment with PCP is currently scheduled.  From conversation with patient's pharmacy, [facility], patient last had simvastatin Rx filled on 04/15/2022 for 30 day supply, but reports pharmacy has just refilled this for patient to pick up - ready today for a 30 day supply.  Note current simvastatin 20 mg Rx is written for a 90 day supply, but is being refilled for 30 days supply each month. Discuss with [facility] who will put a note on the bag for patient to discuss changing to 90 day supply.   Estelle Grumbles, PharmD, [facility] Clinical Pharmacist [facility] [phone]

## 2022-05-22 DIAGNOSIS — M5416 Radiculopathy, lumbar region: Secondary | ICD-10-CM | POA: Diagnosis not present

## 2022-05-26 NOTE — Progress Notes (Signed)
PROVIDER NOTE: Information contained herein reflects review and annotations entered in association with encounter. Interpretation of such information and data should be left to medically-trained personnel. Information provided to patient can be located elsewhere in the medical record under "Patient Instructions". Document created using STT-dictation technology, any transcriptional errors that may result from process are unintentional.    Patient: Charles Ewing  Service Category: E/M  Provider: Oswaldo Done, MD  DOB: 1941/02/05  DOS: 05/27/2022  Referring Provider: Saralyn Pilar *  MRN: [id number]  Specialty: Interventional Pain Management  PCP: Smitty Cords, DO  Type: Established Patient  Setting: Ambulatory outpatient    Location: Office  Delivery: Face-to-face     HPI  [name], a 82 y.o. year old male, is here today because of his No primary diagnosis found.. Charles Ewing primary complain today is No chief complaint on file.  Pertinent problems: Charles Ewing has Osteoarthritis of knees (Bilateral); Bilateral lower extremity edema; Chronic pain syndrome; Chronic hip pain (2ry area of Pain) (Right); Chronic lower extremity pain (3ry area of Pain) (Right); Chronic low back pain (1ry area of Pain) (Bilateral) (R>L) w/o sciatica; Lumbar facet syndrome; Dextroscoliosis of lumbar spine; DDD (degenerative disc disease), lumbosacral; Osteopenia of lumbar spine; Osteopenia determined by x-ray; Osteoarthritis of sacroiliac joints (HCC) (Bilateral); Osteoarthritis of hips (Bilateral); Chronic sacroiliac joint pain (Bilateral); Sacroiliac joint dysfunction (Bilateral); Spondylosis without myelopathy or radiculopathy, lumbosacral region; Other spondylosis, sacral and sacrococcygeal region; Abnormal MRI, lumbar spine (03/11/2022); Chronic low back pain (Bilateral) w/ sciatica (Right); Herniated nucleus pulposus, L4-5 (Right); Lumbosacral radiculopathy at L4 (Right); L4-5 disc bulge  (Right); Spondylolisthesis at L4-L5 level; 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 Pain Assessment: Severity of   is reported as a  /10. Location:    / . Onset:  . Quality:  . Timing:  . Modifying factor(s):  Marland Kitchen Vitals:  vitals were not taken for this visit.  BMI: Estimated body mass index is 27.69 kg/m as calculated from the following:   Height as of 04/09/22: 5' 11.55" (1.817 m).   Weight as of 04/09/22: 201 lb 9.6 oz (91.4 kg). Last encounter: 03/26/2022. Last procedure: 03/12/2022.  Reason for encounter: medication management. ***  RTCB: 08/28/2022   Pharmacotherapy Assessment  Analgesic: Oxycodone IR 5 mg tablet, 1 tab p.o. 2 times daily (#60) MME/day: 15 mg/day   Monitoring: [place] PMP: PDMP reviewed during this encounter.       Pharmacotherapy: No side-effects or adverse reactions reported. Compliance: No problems identified. Effectiveness: Clinically acceptable.  No notes on file  No results found for: "CBDTHCR" No results found for: "D8THCCBX" No results found for: "D9THCCBX"  UDS:  Summary  Date Value Ref Range Status  06/14/2021 Note  Final    Comment:    ==================================================================== Compliance Drug Analysis, Ur ==================================================================== Test                             Result       Flag       Units  Drug Present and Declared for Prescription Verification   Lorazepam                      200          EXPECTED   ng/mg creat    Source of lorazepam is a scheduled prescription medication.    Oxycodone  1671         EXPECTED   ng/mg creat   Noroxycodone                   2522         EXPECTED   ng/mg creat    Sources of oxycodone include scheduled prescription medications.    Noroxycodone is an expected metabolite of oxycodone.    Gabapentin                     PRESENT       EXPECTED   Fluoxetine                     PRESENT      EXPECTED   Norfluoxetine                  PRESENT      EXPECTED    Norfluoxetine is an expected metabolite of fluoxetine.  Drug Absent but Declared for Prescription Verification   Salicylate                     Not Detected UNEXPECTED    Aspirin, as indicated in the declared medication list, is not always    detected even when used as directed.    Metoprolol                     Not Detected UNEXPECTED ==================================================================== Test                      Result    Flag   Units      Ref Range   Creatinine              41               mg/dL      >=16 ==================================================================== Declared Medications:  The flagging and interpretation on this report are based on the  following declared medications.  Unexpected results may arise from  inaccuracies in the declared medications.   **Note: The testing scope of this panel includes these medications:   Fluoxetine (Prozac)  Gabapentin (Neurontin)  Lorazepam (Ativan)  Metoprolol (Toprol)  Oxycodone (Roxicodone)   **Note: The testing scope of this panel does not include small to  moderate amounts of these reported medications:   Aspirin   **Note: The testing scope of this panel does not include the  following reported medications:   Albuterol (Ventolin HFA)  Amlodipine (Norvasc)  Fluticasone (Flonase)  Furosemide (Lasix)  Losartan (Cozaar)  Meloxicam (Mobic)  Mupirocin (Bactroban)  Nystatin  Pantoprazole (Protonix)  Potassium Chloride  Simvastatin (Zocor)  Sucralfate (Carafate)  Tamsulosin (Flomax) ==================================================================== For clinical consultation, please call [phone]. ====================================================================       ROS  Constitutional: Denies any fever or chills Gastrointestinal: No reported hemesis,  hematochezia, vomiting, or acute GI distress Musculoskeletal: Denies any acute onset joint swelling, redness, loss of ROM, or weakness Neurological: No reported episodes of acute onset apraxia, aphasia, dysarthria, agnosia, amnesia, paralysis, loss of coordination, or loss of consciousness  Medication Review  FLUoxetine, LORazepam, Tiotropium Bromide Monohydrate, albuterol, amLODipine, aspirin EC, cyanocobalamin, fluticasone, furosemide, losartan, meloxicam, oxyCODONE, pantoprazole, potassium chloride, simvastatin, and tamsulosin  History Review  Allergy: Charles Ewing is allergic to nexium [esomeprazole magnesium]. Drug: Charles Ewing  reports no history of drug use. Alcohol:  reports no history of alcohol use. Tobacco:  reports that he has been smoking cigarettes.  He has a 70.00 pack-year smoking history. He has never used smokeless tobacco. Social: Charles Ewing  reports that he has been smoking cigarettes. He has a 70.00 pack-year smoking history. He has never used smokeless tobacco. He reports that he does not drink alcohol and does not use drugs. Medical:  has a past medical history of Abdominal aortic aneurysm (AAA) (HCC) (2013), Anxiety, Arrhythmia, Arthritis, Colon polyp, COPD (chronic obstructive pulmonary disease) (HCC), Coronary artery disease, Depression, GERD (gastroesophageal reflux disease), Hyperlipidemia, Hypertension, Skin cancer, and Thrush. Surgical: Charles Ewing  has a past surgical history that includes Skin cancer excision; Colon polyp surgery; Transurethral resection of prostate (2010); Cataract extraction w/PHACO (Left, 05/09/2020); and Cataract extraction w/PHACO (Right, 05/23/2020). Family: family history includes Kidney cancer in his mother.  Laboratory Chemistry Profile   Renal Lab Results  Component Value Date   BUN 18 06/13/2021   CREATININE 1.18 06/13/2021   BCR 15 06/13/2021   GFRAA 64 01/11/2019   GFRNONAA >60 12/23/2020    Hepatic Lab Results  Component Value Date    AST 13 06/13/2021   ALT 11 10/30/2020   ALBUMIN 3.9 06/13/2021   ALKPHOS 70 06/13/2021    Electrolytes Lab Results  Component Value Date   NA 138 06/13/2021   K 4.6 06/13/2021   CL 102 06/13/2021   CALCIUM 8.8 06/13/2021   MG 2.3 06/13/2021    Bone Lab Results  Component Value Date   25OHVITD1 14 (L) 06/13/2021   25OHVITD2 1.8 06/13/2021   25OHVITD3 12 06/13/2021    Inflammation (CRP: Acute Phase) (ESR: Chronic Phase) Lab Results  Component Value Date   CRP 1 06/13/2021   ESRSEDRATE 20 06/13/2021         Note: Above Lab results reviewed.  Recent Imaging Review  DG PAIN CLINIC C-ARM 1-60 MIN NO REPORT Fluoro was used, but no Radiologist interpretation will be provided.  Please refer to "NOTES" tab for provider progress note. Note: Reviewed        Physical Exam  General appearance: Well nourished, well developed, and well hydrated. In no apparent acute distress Mental status: Alert, oriented x 3 (person, place, & time)       Respiratory: No evidence of acute respiratory distress Eyes: PERLA Vitals: There were no vitals taken for this visit. BMI: Estimated body mass index is 27.69 kg/m as calculated from the following:   Height as of 04/09/22: 5' 11.55" (1.817 m).   Weight as of 04/09/22: 201 lb 9.6 oz (91.4 kg). Ideal: Patient weight not recorded  Assessment   Diagnosis Status  1. Chronic pain syndrome   2. Pharmacologic therapy   3. DDD (degenerative disc disease), lumbosacral   4. Osteoarthritis of hips (Bilateral)   5. Encounter for medication management   6. Chronic hip pain (2ry area of Pain) (Right)   7. Encounter for chronic pain management   8. Chronic lower extremity pain (3ry area of Pain) (Right)   9. Chronic use of opiate for therapeutic purpose   10. Lumbar facet syndrome (Right)   11. Chronic low back pain (1ry area of Pain) (Bilateral) (R>L) w/o sciatica   12. Osteoarthritis of sacroiliac joints (HCC) (Bilateral)   13. Dextroscoliosis of  lumbar spine    Controlled Controlled Controlled   Updated Problems: No problems updated.  Plan of Care  Problem-specific:  No problem-specific Assessment & Plan notes found for this encounter.  Charles Ewing has a current medication list which includes the following long-term medication(s): albuterol, amlodipine, fluoxetine,  fluticasone, furosemide, losartan, oxycodone, oxycodone, potassium chloride, simvastatin, and spiriva respimat.  Pharmacotherapy (Medications Ordered): No orders of the defined types were placed in this encounter.  Orders:  No orders of the defined types were placed in this encounter.  Follow-up plan:   No follow-ups on file.      Interventional Therapies  Risk Factors  Considerations:   Abdominal aortic aneurysm (AAA)  HTN  Cardiomyopathy  CAD  orthostatic hypotension  Irregular heart rhythm CKD  SOB  Tobacco abuse  COPD  GERD   Planned  Pending:      Under consideration:   Diagnostic/therapeutic right L4-5 LESI #2  Diagnostic/therapeutic bilateral IA hip joint inj. #1    Completed:   Diagnostic/therapeutic right L4-5 LESI x1 (03/12/2022)  Therapeutic right lumbar facet RFA x1 (10/18/2021) (100/100/50/50)  Diagnostic right lumbar facet MBB x2 (09/13/2021) (100/100/50/50)    Completed by other providers:   Diagnostic/therapeutic bilateral IA steroid knee inj. (03/15/2016) by Altamese Cabal, PA ([email or web address])    Therapeutic  Palliative (PRN) options:   Therapeutic right lumbar facet MBB         Recent Visits Date Type Provider Dept  03/26/22 Office Visit Delano Metz, MD [facility]  03/12/22 Procedure visit Delano Metz, MD [facility]  02/27/22 Office Visit Delano Metz, MD [facility]  Showing recent visits within past 90 days and meeting all other requirements Future Appointments Date Type Provider Dept  05/27/22 Appointment Delano Metz, MD [facility]   Showing future appointments within next 90 days and meeting all other requirements  I discussed the assessment and treatment plan with the patient. The patient was provided an opportunity to ask questions and all were answered. The patient agreed with the plan and demonstrated an understanding of the instructions.  Patient advised to call back or seek an in-person evaluation if the symptoms or condition worsens.  Duration of encounter: *** minutes.  Total time on encounter, as per AMA guidelines included both the face-to-face and non-face-to-face time personally spent by the physician and/or other qualified health care professional(s) on the day of the encounter (includes time in activities that require the physician or other qualified health care professional and does not include time in activities normally performed by clinical staff). Physician's time may include the following activities when performed: Preparing to see the patient (e.g., pre-charting review of records, searching for previously ordered imaging, lab work, and nerve conduction tests) Review of prior analgesic pharmacotherapies. Reviewing PMP Interpreting ordered tests (e.g., lab work, imaging, nerve conduction tests) Performing post-procedure evaluations, including interpretation of diagnostic procedures Obtaining and/or reviewing separately obtained history Performing a medically appropriate examination and/or evaluation Counseling and educating the patient/family/caregiver Ordering medications, tests, or procedures Referring and communicating with other health care professionals (when not separately reported) Documenting clinical information in the electronic or other health record Independently interpreting results (not separately reported) and communicating results to the patient/ family/caregiver Care coordination (not separately reported)  Note by: Oswaldo Done, MD Date: 05/27/2022; Time: 10:23 AM

## 2022-05-27 ENCOUNTER — Encounter: Payer: Self-pay | Admitting: Pain Medicine

## 2022-05-27 ENCOUNTER — Ambulatory Visit: Payer: 59 | Attending: Pain Medicine | Admitting: Pain Medicine

## 2022-05-27 VITALS — BP 125/78 | HR 60 | Temp 97.2°F | Resp 18 | Ht 71.0 in | Wt 190.0 lb

## 2022-05-27 DIAGNOSIS — M79604 Pain in right leg: Secondary | ICD-10-CM | POA: Diagnosis not present

## 2022-05-27 DIAGNOSIS — M47816 Spondylosis without myelopathy or radiculopathy, lumbar region: Secondary | ICD-10-CM | POA: Insufficient documentation

## 2022-05-27 DIAGNOSIS — Z79899 Other long term (current) drug therapy: Secondary | ICD-10-CM | POA: Diagnosis not present

## 2022-05-27 DIAGNOSIS — M545 Low back pain, unspecified: Secondary | ICD-10-CM | POA: Diagnosis not present

## 2022-05-27 DIAGNOSIS — M160 Bilateral primary osteoarthritis of hip: Secondary | ICD-10-CM | POA: Insufficient documentation

## 2022-05-27 DIAGNOSIS — M461 Sacroiliitis, not elsewhere classified: Secondary | ICD-10-CM | POA: Insufficient documentation

## 2022-05-27 DIAGNOSIS — G8929 Other chronic pain: Secondary | ICD-10-CM | POA: Insufficient documentation

## 2022-05-27 DIAGNOSIS — G894 Chronic pain syndrome: Secondary | ICD-10-CM | POA: Diagnosis not present

## 2022-05-27 DIAGNOSIS — M5137 Other intervertebral disc degeneration, lumbosacral region: Secondary | ICD-10-CM | POA: Insufficient documentation

## 2022-05-27 DIAGNOSIS — M25551 Pain in right hip: Secondary | ICD-10-CM | POA: Diagnosis not present

## 2022-05-27 DIAGNOSIS — M4186 Other forms of scoliosis, lumbar region: Secondary | ICD-10-CM | POA: Insufficient documentation

## 2022-05-27 DIAGNOSIS — Z79891 Long term (current) use of opiate analgesic: Secondary | ICD-10-CM | POA: Diagnosis not present

## 2022-05-27 MED ORDER — OXYCODONE HCL 5 MG PO TABS: 5.0000 mg | ORAL_TABLET | Freq: Two times a day (BID) | ORAL | 0 refills | Status: DC | PRN

## 2022-05-27 NOTE — Progress Notes (Signed)
Safety precautions to be maintained throughout the outpatient stay will include: orient to surroundings, keep bed in low position, maintain call bell within reach at all times, provide assistance with transfer out of bed and ambulation.   Nursing Pain Medication Assessment:  Safety precautions to be maintained throughout the outpatient stay will include: orient to surroundings, keep bed in low position, maintain call bell within reach at all times, provide assistance with transfer out of bed and ambulation.  Medication Inspection Compliance: Pill count conducted under aseptic conditions, in front of the patient. Neither the pills nor the bottle was removed from the patient's sight at any time. Once count was completed pills were immediately returned to the patient in their original bottle.  Medication: Oxycodone IR Pill/Patch Count:  36 of 60 pills remain Pill/Patch Appearance: Markings consistent with prescribed medication Bottle Appearance: Standard pharmacy container. Clearly labeled. Filled Date: 03 / 19 / 2024 Last Medication intake:  Yesterday

## 2022-05-27 NOTE — Patient Instructions (Signed)
____________________________________________________________________________________________  Opioid Pain Medication Update  To: All patients taking opioid pain medications. (I.e.: hydrocodone, hydromorphone, oxycodone, oxymorphone, morphine, codeine, methadone, tapentadol, tramadol, buprenorphine, fentanyl, etc.)  Re: Updated review of side effects and adverse reactions of opioid analgesics, as well as new information about long term effects of this class of medications.  Direct risks of long-term opioid therapy are not limited to opioid addiction and overdose. Potential medical risks include serious fractures, breathing problems during sleep, hyperalgesia, immunosuppression, chronic constipation, bowel obstruction, myocardial infarction, and tooth decay secondary to xerostomia.  Unpredictable adverse effects that can occur even if you take your medication correctly: Cognitive impairment, respiratory depression, and death. Most people think that if they take their medication "correctly", and "as instructed", that they will be safe. Nothing could be farther from the truth. In reality, a significant amount of recorded deaths associated with the use of opioids has occurred in individuals that had taken the medication for a long time, and were taking their medication correctly. The following are examples of how this can happen: Patient taking his/her medication for a long time, as instructed, without any side effects, is given a certain antibiotic or another unrelated medication, which in turn triggers a "Drug-to-drug interaction" leading to disorientation, cognitive impairment, impaired reflexes, respiratory depression or an untoward event leading to serious bodily harm or injury, including death.  Patient taking his/her medication for a long time, as instructed, without any side effects, develops an acute impairment of liver and/or kidney function. This will lead to a rapid inability of the body to  breakdown and eliminate their pain medication, which will result in effects similar to an "overdose", but with the same medicine and dose that they had always taken. This again may lead to disorientation, cognitive impairment, impaired reflexes, respiratory depression or an untoward event leading to serious bodily harm or injury, including death.  A similar problem will occur with patients as they grow older and their liver and kidney function begins to decrease as part of the aging process.  Background information: Historically, the original case for using long-term opioid therapy to treat chronic noncancer pain was based on safety assumptions that subsequent experience has called into question. In 1996, the American Pain Society and the American Academy of Pain Medicine issued a consensus statement supporting long-term opioid therapy. This statement acknowledged the dangers of opioid prescribing but concluded that the risk for addiction was low; respiratory depression induced by opioids was short-lived, occurred mainly in opioid-naive patients, and was antagonized by pain; tolerance was not a common problem; and efforts to control diversion should not constrain opioid prescribing. This has now proven to be wrong. Experience regarding the risks for opioid addiction, misuse, and overdose in community practice has failed to support these assumptions.  According to the Centers for Disease Control and Prevention, fatal overdoses involving opioid analgesics have increased sharply over the past decade. Currently, more than 96,700 people die from drug overdoses every year. Opioids are a factor in 7 out of every 10 overdose deaths. Deaths from drug overdose have surpassed motor vehicle accidents as the leading cause of death for individuals between the ages of 35 and 54.  Clinical data suggest that neuroendocrine dysfunction may be very common in both men and women, potentially causing hypogonadism, erectile  dysfunction, infertility, decreased libido, osteoporosis, and depression. Recent studies linked higher opioid dose to increased opioid-related mortality. Controlled observational studies reported that long-term opioid therapy may be associated with increased risk for cardiovascular events. Subsequent meta-analysis concluded   that the safety of long-term opioid therapy in elderly patients has not been proven.   Side Effects and adverse reactions: Common side effects: Drowsiness (sedation). Dizziness. Nausea and vomiting. Constipation. Physical dependence -- Dependence often manifests with withdrawal symptoms when opioids are discontinued or decreased. Tolerance -- As you take repeated doses of opioids, you require increased medication to experience the same effect of pain relief. Respiratory depression -- This can occur in healthy people, especially with higher doses. However, people with COPD, asthma or other lung conditions may be even more susceptible to fatal respiratory impairment.  Uncommon side effects: An increased sensitivity to feeling pain and extreme response to pain (hyperalgesia). Chronic use of opioids can lead to this. Delayed gastric emptying (the process by which the contents of your stomach are moved into your small intestine). Muscle rigidity. Immune system and hormonal dysfunction. Quick, involuntary muscle jerks (myoclonus). Arrhythmia. Itchy skin (pruritus). Dry mouth (xerostomia).  Long-term side effects: Chronic constipation. Sleep-disordered breathing (SDB). Increased risk of bone fractures. Hypothalamic-pituitary-adrenal dysregulation. Increased risk of overdose.  RISKS: Fractures and Falls:  Opioids increase the risk and incidence of falls. This is of particular importance in elderly patients.  Endocrine System:  Long-term administration is associated with endocrine abnormalities (endocrinopathies). (Also known as Opioid-induced Endocrinopathy) Influences  on both the hypothalamic-pituitary-adrenal axis?and the hypothalamic-pituitary-gonadal axis have been demonstrated with consequent hypogonadism and adrenal insufficiency in both sexes. Hypogonadism and decreased levels of dehydroepiandrosterone sulfate have been reported in men and women. Endocrine effects include: Amenorrhoea in women (abnormal absence of menstruation) Reduced libido in both sexes Decreased sexual function Erectile dysfunction in men Hypogonadisms (decreased testicular function with shrinkage of testicles) Infertility Depression and fatigue Loss of muscle mass Anxiety Depression Immune suppression Hyperalgesia Weight gain Anemia Osteoporosis Patients (particularly women of childbearing age) should avoid opioids. There is insufficient evidence to recommend routine monitoring of asymptomatic patients taking opioids in the long-term for hormonal deficiencies.  Immune System: Human studies have demonstrated that opioids have an immunomodulating effect. These effects are mediated via opioid receptors both on immune effector cells and in the central nervous system. Opioids have been demonstrated to have adverse effects on antimicrobial response and anti-tumour surveillance. Buprenorphine has been demonstrated to have no impact on immune function.  Opioid Induced Hyperalgesia: Human studies have demonstrated that prolonged use of opioids can lead to a state of abnormal pain sensitivity, sometimes called opioid induced hyperalgesia (OIH). Opioid induced hyperalgesia is not usually seen in the absence of tolerance to opioid analgesia. Clinically, hyperalgesia may be diagnosed if the patient on long-term opioid therapy presents with increased pain. This might be qualitatively and anatomically distinct from pain related to disease progression or to breakthrough pain resulting from development of opioid tolerance. Pain associated with hyperalgesia tends to be more diffuse than the  pre-existing pain and less defined in quality. Management of opioid induced hyperalgesia requires opioid dose reduction.  Cancer: Chronic opioid therapy has been associated with an increased risk of cancer among noncancer patients with chronic pain. This association was more evident in chronic strong opioid users. Chronic opioid consumption causes significant pathological changes in the small intestine and colon. Epidemiological studies have found that there is a link between opium dependence and initiation of gastrointestinal cancers. Cancer is the second leading cause of death after cardiovascular disease. Chronic use of opioids can cause multiple conditions such as GERD, immunosuppression and renal damage as well as carcinogenic effects, which are associated with the incidence of cancers.   Mortality: Long-term opioid use   has been associated with increased mortality among patients with chronic non-cancer pain (CNCP).  Prescription of long-acting opioids for chronic noncancer pain was associated with a significantly increased risk of all-cause mortality, including deaths from causes other than overdose.  Reference: Von Korff M, Kolodny A, Deyo RA, Chou R. Long-term opioid therapy reconsidered. Ann Intern Med. 2011 Sep 6;155(5):325-8. doi: 10.7326/[id number]. PMID: 21893626; PMCID: PMC3280085. Bedson J, Chen Y, Ashworth J, Hayward RA, Dunn KM, Jordan KP. Risk of adverse events in patients prescribed long-term opioids: A cohort study in the [place] Clinical Practice Research Datalink. Eur J Pain. 2019 May;23(5):908-922. doi: 10.1002/ejp.1357. Epub 2019 Jan 31. PMID: 30620116. Colameco S, Coren JS, Ciervo CA. Continuous opioid treatment for chronic noncancer pain: a time for moderation in prescribing. Postgrad Med. 2009 Jul;121(4):61-6. doi: 10.3810/pgm.2009.07.2032. PMID: 19641271. Chou R, Turner JA, Devine EB, Hansen RN, Sullivan SD, Blazina I, Dana T, Bougatsos C, Deyo RA. The  effectiveness and risks of long-term opioid therapy for chronic pain: a systematic review for a National Institutes of Health Pathways to Prevention Workshop. Ann Intern Med. 2015 Feb 17;162(4):276-86. doi: 10.7326/M14-2559. PMID: 25581257. Warner M, Chen LH, Makuc DM. NCHS Data Brief No. 22. [place]: Centers for Disease Control and Prevention; 2009. Sep, Increase in Fatal Poisonings Involving Opioid Analgesics in the [place], 1999-2006. Song IA, Choi HR, Oh TK. Long-term opioid use and mortality in patients with chronic non-cancer pain: Ten-year follow-up study in [place] from 2010 through 2019. EClinicalMedicine. 2022 Jul 18;51:101558. doi: 10.1016/j.eclinm.2022.101558. PMID: 35875817; PMCID: PMC9304910. Huser, W., Schubert, T., Vogelmann, T. et al. All-cause mortality in patients with long-term opioid therapy compared with non-opioid analgesics for chronic non-cancer pain: a database study. BMC Med 18, 162 (2020). https://doi.org/10.1186/[email or web address] Rashidian H, Zendehdel K, Kamangar F, Malekzadeh R, Haghdoost AA. An Ecological Study of the Association between Opiate Use and Incidence of Cancers. Addict Health. 2016 Fall;8(4):252-260. PMID: 28819556; PMCID: PMC5554805.  Our Goal: Our goal is to control your pain with means other than the use of opioid pain medications.  Our Recommendation: Talk to your physician about coming off of these medications. We can assist you with the tapering down and stopping these medicines. Based on the new information, even if you cannot completely stop the medication, a decrease in the dose may be associated with a lesser risk. Ask for other means of controlling the pain. Decrease or eliminate those factors that significantly contribute to your pain such as smoking, obesity, and a diet heavily tilted towards "inflammatory" nutrients.  Last Updated: 04/10/2022    ____________________________________________________________________________________________     ____________________________________________________________________________________________  Patient Information update  To: All of our patients.  Re: Name change.  It has been made official that our current name, "[facility]"   will soon be changed to "[facility]".   The purpose of this change is to eliminate any confusion created by the concept of our practice being a "Medication Management Pain Clinic". In the past this has led to the misconception that we treat pain primarily by the use of prescription medications.  Nothing can be farther from the truth.   Understanding PAIN MANAGEMENT: To further understand what our practice does, you first have to understand that "Pain Management" is a subspecialty that requires additional training once a physician has completed their specialty training, which can be in either Anesthesia, Neurology, Psychiatry, or Physical Medicine and Rehabilitation (PMR). Each one of these contributes to the final approach taken by each physician to   the management of their patient's pain. To be a "Pain Management Specialist" you must have first completed one of the specialty trainings below.  Anesthesiologists - trained in clinical pharmacology and interventional techniques such as nerve blockade and regional as well as central neuroanatomy. They are trained to block pain before, during, and after surgical interventions.  Neurologists - trained in the diagnosis and pharmacological treatment of complex neurological conditions, such as Multiple Sclerosis, Parkinson's, spinal cord injuries, and other systemic conditions that may be associated with symptoms that may include but are not limited to pain. They tend to rely primarily on the treatment of chronic pain  using prescription medications.  Psychiatrist - trained in conditions affecting the psychosocial wellbeing of patients including but not limited to depression, anxiety, schizophrenia, personality disorders, addiction, and other substance use disorders that may be associated with chronic pain. They tend to rely primarily on the treatment of chronic pain using prescription medications.   Physical Medicine and Rehabilitation (PMR) physicians, also known as physiatrists - trained to treat a wide variety of medical conditions affecting the brain, spinal cord, nerves, bones, joints, ligaments, muscles, and tendons. Their training is primarily aimed at treating patients that have suffered injuries that have caused severe physical impairment. Their training is primarily aimed at the physical therapy and rehabilitation of those patients. They may also work alongside orthopedic surgeons or neurosurgeons using their expertise in assisting surgical patients to recover after their surgeries.  INTERVENTIONAL PAIN MANAGEMENT is sub-subspecialty of Pain Management.  Our physicians are Board-certified in Anesthesia, Pain Management, and Interventional Pain Management.  This meaning that not only have they been trained and Board-certified in their specialty of Anesthesia, and subspecialty of Pain Management, but they have also received further training in the sub-subspecialty of Interventional Pain Management, in order to become Board-certified as INTERVENTIONAL PAIN MANAGEMENT SPECIALIST.    Mission: Our goal is to use our skills in  INTERVENTIONAL PAIN MANAGEMENT as alternatives to the chronic use of prescription opioid medications for the treatment of pain. To make this more clear, we have changed our name to reflect what we do and offer. We will continue to offer medication management assessment and recommendations, but we will not be taking over any patient's medication  management.  ____________________________________________________________________________________________     ____________________________________________________________________________________________  National Pain Medication Shortage  The [place] is experiencing worsening drug shortages. These have had a negative widespread effect on patient care and treatment. Not expected to improve any time soon. Predicted to last past 2029.   Drug shortage list (generic names) Oxycodone IR Oxycodone/APAP Oxymorphone IR Hydromorphone Hydrocodone/APAP Morphine  Where is the problem?  Manufacturing and supply level.  Will this shortage affect you?  Only if you take any of the above pain medications.  How? You may be unable to fill your prescription.  Your pharmacist may offer a "partial fill" of your prescription. (Warning: Do not accept partial fills.) Prescriptions partially filled cannot be transferred to another pharmacy. Read our Medication Rules and Regulation. Depending on how much medicine you are dependent on, you may experience withdrawals when unable to get the medication.  Recommendations: Consider ending your dependence on opioid pain medications. Ask your pain specialist to assist you with the process. Consider switching to a medication currently not in shortage, such as Buprenorphine. Talk to your pain specialist about this option. Consider decreasing your pain medication requirements by managing tolerance thru "Drug Holidays". This may help minimize withdrawals, should you run out of medicine. Control your pain thru   the use of non-pharmacological interventional therapies.   Your prescriber: Prescribers cannot be blamed for shortages. Medication manufacturing and supply issues cannot be fixed by the prescriber.   NOTE: The prescriber is not responsible for supplying the medication, or solving supply issues. Work with your pharmacist to solve it. The patient is responsible for  the decision to take or continue taking the medication and for identifying and securing a legal supply source. By law, supplying the medication is the job and responsibility of the pharmacy. The prescriber is responsible for the evaluation, monitoring, and prescribing of these medications.   Prescribers will NOT: Re-issue prescriptions that have been partially filled. Re-issue prescriptions already sent to a pharmacy.  Re-send prescriptions to a different pharmacy because yours did not have your medication. Ask pharmacist to order more medicine or transfer the prescription to another pharmacy. (Read below.)  New 2023 regulation: "October 12, 2021 Revised Regulation Allows DEA-Registered Pharmacies to Transfer Electronic Prescriptions at a Patient's Request DEA Headquarters Division - Public Information Office Patients now have the ability to request their electronic prescription be transferred to another pharmacy without having to go back to their practitioner to initiate the request. This revised regulation went into effect on Monday, October 08, 2021.     At a patient's request, a DEA-registered retail pharmacy can now transfer an electronic prescription for a controlled substance (schedules II-V) to another DEA-registered retail pharmacy. Prior to this change, patients would have to go through their practitioner to cancel their prescription and have it re-issued to a different pharmacy. The process was taxing and time consuming for both patients and practitioners.    The Drug Enforcement Administration (DEA) published its intent to revise the process for transferring electronic prescriptions on December 31, 2019.  The final rule was published in the federal register on September 06, 2021 and went into effect 30 days later.  Under the final rule, a prescription can only be transferred once between pharmacies, and only if allowed under existing state or other applicable law. The prescription must  remain in its electronic form; may not be altered in any way; and the transfer must be communicated directly between two licensed pharmacists. It's important to note, any authorized refills transfer with the original prescription, which means the entire prescription will be filled at the same pharmacy".  Reference: https://www.dea.gov/stories/2023/2023-10/2021-09-01/revised-regulation-allows-dea-registered-pharmacies-transfer (DEA website announcement)  https://www.govinfo.gov/content/pkg/FR-2021-09-06/pdf/2023-15847.pdf (Federal Register  Department of Justice)   Federal Register / Vol. 88, No. 143 / Thursday, September 06, 2021 / Rules and Regulations DEPARTMENT OF JUSTICE  Drug Enforcement Administration  21 CFR Part 1306  [Docket No. [id number]]  RIN 1117-AB64 Transfer of Electronic Prescriptions for Schedules II-V Controlled Substances Between Pharmacies for Initial Filling  ____________________________________________________________________________________________     ____________________________________________________________________________________________  Transfer of Pain Medication between Pharmacies  Re: 2023 DEA Clarification on existing regulation  Published on DEA Website: October 12, 2021  Title: Revised Regulation Allows DEA-Registered Pharmacies to Transfer Electronic Prescriptions at a Patient's Request DEA Headquarters Division - Public Information Office  "Patients now have the ability to request their electronic prescription be transferred to another pharmacy without having to go back to their practitioner to initiate the request. This revised regulation went into effect on Monday, October 08, 2021.     At a patient's request, a DEA-registered retail pharmacy can now transfer an electronic prescription for a controlled substance (schedules II-V) to another DEA-registered retail pharmacy. Prior to this change, patients would have to go through their practitioner to  cancel their prescription   and have it re-issued to a different pharmacy. The process was taxing and time consuming for both patients and practitioners.    The Drug Enforcement Administration (DEA) published its intent to revise the process for transferring electronic prescriptions on December 31, 2019.  The final rule was published in the federal register on September 06, 2021 and went into effect 30 days later.  Under the final rule, a prescription can only be transferred once between pharmacies, and only if allowed under existing state or other applicable law. The prescription must remain in its electronic form; may not be altered in any way; and the transfer must be communicated directly between two licensed pharmacists. It's important to note, any authorized refills transfer with the original prescription, which means the entire prescription will be filled at the same pharmacy."    REFERENCES: 1. DEA website announcement https://www.dea.gov/stories/2023/2023-10/2021-09-01/revised-regulation-allows-dea-registered-pharmacies-transfer  2. Department of Justice website  https://www.govinfo.gov/content/pkg/FR-2021-09-06/pdf/2023-15847.pdf  3. DEPARTMENT OF JUSTICE Drug Enforcement Administration 21 CFR Part 1306 [Docket No. [id number]] RIN 1117-AB64 "Transfer of Electronic Prescriptions for Schedules II-V Controlled Substances Between Pharmacies for Initial Filling"  ____________________________________________________________________________________________     _______________________________________________________________________  Medication Rules  Purpose: To inform patients, and their family members, of our medication rules and regulations.  Applies to: All patients receiving prescriptions from our practice (written or electronic).  Pharmacy of record: This is the pharmacy where your electronic prescriptions will be sent. Make sure we have the correct one.  Electronic prescriptions: In  compliance with the [place] Strengthen Opioid Misuse Prevention (STOP) Act of 2017 (Session Law 2017-74/H243), effective February 11, 2018, all controlled substances must be electronically prescribed. Written prescriptions, faxing, or calling prescriptions to a pharmacy will no longer be done.  Prescription refills: These will be provided only during in-person appointments. No medications will be renewed without a "face-to-face" evaluation with your provider. Applies to all prescriptions.  NOTE: The following applies primarily to controlled substances (Opioid* Pain Medications).   Type of encounter (visit): For patients receiving controlled substances, face-to-face visits are required. (Not an option and not up to the patient.)  Patient's responsibilities: Pain Pills: Bring all pain pills to every appointment (except for procedure appointments). Pill Bottles: Bring pills in original pharmacy bottle. Bring bottle, even if empty. Always bring the bottle of the most recent fill.  Medication refills: You are responsible for knowing and keeping track of what medications you are taking and when is it that you will need a refill. The day before your appointment: write a list of all prescriptions that need to be refilled. The day of the appointment: give the list to the admitting nurse. Prescriptions will be written only during appointments. No prescriptions will be written on procedure days. If you forget a medication: it will not be "Called in", "Faxed", or "electronically sent". You will need to get another appointment to get these prescribed. No early refills. Do not call asking to have your prescription filled early. Partial  or short prescriptions: Occasionally your pharmacy may not have enough pills to fill your prescription.  NEVER ACCEPT a partial fill or a prescription that is short of the total amount of pills that you were prescribed.  With controlled substances the law allows 72 hours for  the pharmacy to complete the prescription.  If the prescription is not completed within 72 hours, the pharmacist will require a new prescription to be written. This means that you will be short on your medicine and we WILL NOT send another prescription to complete your original   prescription.  Instead, request the pharmacy to send a carrier to a nearby branch to get enough medication to provide you with your full prescription. Prescription Accuracy: You are responsible for carefully inspecting your prescriptions before leaving our office. Have the discharge nurse carefully go over each prescription with you, before taking them home. Make sure that your name is accurately spelled, that your address is correct. Check the name and dose of your medication to make sure it is accurate. Check the number of pills, and the written instructions to make sure they are clear and accurate. Make sure that you are given enough medication to last until your next medication refill appointment. Taking Medication: Take medication as prescribed. When it comes to controlled substances, taking less pills or less frequently than prescribed is permitted and encouraged. Never take more pills than instructed. Never take the medication more frequently than prescribed.  Inform other Doctors: Always inform, all of your healthcare providers, of all the medications you take. Pain Medication from other Providers: You are not allowed to accept any additional pain medication from any other Doctor or Healthcare provider. There are two exceptions to this rule. (see below) In the event that you require additional pain medication, you are responsible for notifying us, as stated below. Cough Medicine: Often these contain an opioid, such as codeine or hydrocodone. Never accept or take cough medicine containing these opioids if you are already taking an opioid* medication. The combination may cause respiratory failure and death. Medication Agreement:  You are responsible for carefully reading and following our Medication Agreement. This must be signed before receiving any prescriptions from our practice. Safely store a copy of your signed Agreement. Violations to the Agreement will result in no further prescriptions. (Additional copies of our Medication Agreement are available upon request.) Laws, Rules, & Regulations: All patients are expected to follow all Federal and State Laws, Statutes, Rules, & Regulations. Ignorance of the Laws does not constitute a valid excuse.  Illegal drugs and Controlled Substances: The use of illegal substances (including, but not limited to marijuana and its derivatives) and/or the illegal use of any controlled substances is strictly prohibited. Violation of this rule may result in the immediate and permanent discontinuation of any and all prescriptions being written by our practice. The use of any illegal substances is prohibited. Adopted CDC guidelines & recommendations: Target dosing levels will be at or below 60 MME/day. Use of benzodiazepines** is not recommended.  Exceptions: There are only two exceptions to the rule of not receiving pain medications from other Healthcare Providers. Exception #1 (Emergencies): In the event of an emergency (i.e.: accident requiring emergency care), you are allowed to receive additional pain medication. However, you are responsible for: As soon as you are able, call our office [phone], at any time of the day or night, and leave a message stating your name, the date and nature of the emergency, and the name and dose of the medication prescribed. In the event that your call is answered by a member of our staff, make sure to document and save the date, time, and the name of the person that took your information.  Exception #2 (Planned Surgery): In the event that you are scheduled by another doctor or dentist to have any type of surgery or procedure, you are allowed (for a period no  longer than 30 days), to receive additional pain medication, for the acute post-op pain. However, in this case, you are responsible for picking up a copy of   our "Post-op Pain Management for Surgeons" handout, and giving it to your surgeon or dentist. This document is available at our office, and does not require an appointment to obtain it. Simply go to our office during business hours (Monday-Thursday from 8:00 AM to 4:00 PM) (Friday 8:00 AM to 12:00 Noon) or if you have a scheduled appointment with us, prior to your surgery, and ask for it by name. In addition, you are responsible for: calling our office [phone], at any time of the day or night, and leaving a message stating your name, name of your surgeon, type of surgery, and date of procedure or surgery. Failure to comply with your responsibilities may result in termination of therapy involving the controlled substances. Medication Agreement Violation. Following the above rules, including your responsibilities will help you in avoiding a Medication Agreement Violation ("Breaking your Pain Medication Contract").  Consequences:  Not following the above rules may result in permanent discontinuation of medication prescription therapy.  *Opioid medications include: morphine, codeine, oxycodone, oxymorphone, hydrocodone, hydromorphone, meperidine, tramadol, tapentadol, buprenorphine, fentanyl, methadone. **Benzodiazepine medications include: diazepam (Valium), alprazolam (Xanax), clonazepam (Klonopine), lorazepam (Ativan), clorazepate (Tranxene), chlordiazepoxide (Librium), estazolam (Prosom), oxazepam (Serax), temazepam (Restoril), triazolam (Halcion) (Last updated: 12/04/2021) ______________________________________________________________________    ______________________________________________________________________  Medication Recommendations and Reminders  Applies to: All patients receiving prescriptions (written and/or  electronic).  Medication Rules & Regulations: You are responsible for reading, knowing, and following our "Medication Rules" document. These exist for your safety and that of others. They are not flexible and neither are we. Dismissing or ignoring them is an act of "non-compliance" that may result in complete and irreversible termination of such medication therapy. For safety reasons, "non-compliance" will not be tolerated. As with the [place] fundamental legal principle of "ignorance of the law is no defense", we will accept no excuses for not having read and knowing the content of documents provided to you by our practice.  Pharmacy of record:  Definition: This is the pharmacy where your electronic prescriptions will be sent.  We do not endorse any particular pharmacy. It is up to you and your insurance to decide what pharmacy to use.  We do not restrict you in your choice of pharmacy. However, once we write for your prescriptions, we will NOT be re-sending more prescriptions to fix restricted supply problems created by your pharmacy, or your insurance.  The pharmacy listed in the electronic medical record should be the one where you want electronic prescriptions to be sent. If you choose to change pharmacy, simply notify our nursing staff. Changes will be made only during your regular appointments and not over the phone.  Recommendations: Keep all of your pain medications in a safe place, under lock and key, even if you live alone. We will NOT replace lost, stolen, or damaged medication. We do not accept "Police Reports" as proof of medications having been stolen. After you fill your prescription, take 1 week's worth of pills and put them away in a safe place. You should keep a separate, properly labeled bottle for this purpose. The remainder should be kept in the original bottle. Use this as your primary supply, until it runs out. Once it's gone, then you know that you have 1 week's worth of medicine,  and it is time to come in for a prescription refill. If you do this correctly, it is unlikely that you will ever run out of medicine. To make sure that the above recommendation works, it is very important that you make   sure your medication refill appointments are scheduled at least 1 week before you run out of medicine. To do this in an effective manner, make sure that you do not leave the office without scheduling your next medication management appointment. Always ask the nursing staff to show you in your prescription , when your medication will be running out. Then arrange for the receptionist to get you a return appointment, at least 7 days before you run out of medicine. Do not wait until you have 1 or 2 pills left, to come in. This is very poor planning and does not take into consideration that we may need to cancel appointments due to bad weather, sickness, or emergencies affecting our staff. DO NOT ACCEPT A "Partial Fill": If for any reason your pharmacy does not have enough pills/tablets to completely fill or refill your prescription, do not allow for a "partial fill". The law allows the pharmacy to complete that prescription within 72 hours, without requiring a new prescription. If they do not fill the rest of your prescription within those 72 hours, you will need a separate prescription to fill the remaining amount, which we will NOT provide. If the reason for the partial fill is your insurance, you will need to talk to the pharmacist about payment alternatives for the remaining tablets, but again, DO NOT ACCEPT A PARTIAL FILL, unless you can trust your pharmacist to obtain the remainder of the pills within 72 hours.  Prescription refills and/or changes in medication(s):  Prescription refills, and/or changes in dose or medication, will be conducted only during scheduled medication management appointments. (Applies to both, written and electronic prescriptions.) No refills on procedure days. No  medication will be changed or started on procedure days. No changes, adjustments, and/or refills will be conducted on a procedure day. Doing so will interfere with the diagnostic portion of the procedure. No phone refills. No medications will be "called into the pharmacy". No Fax refills. No weekend refills. No Holliday refills. No after hours refills.  Remember:  Business hours are:  Monday to Thursday 8:00 AM to 4:00 PM Provider's Schedule: Klover Priestly, MD - Appointments are:  Medication management: Monday and Wednesday 8:00 AM to 4:00 PM Procedure day: Tuesday and Thursday 7:30 AM to 4:00 PM Bilal Lateef, MD - Appointments are:  Medication management: Tuesday and Thursday 8:00 AM to 4:00 PM Procedure day: Monday and Wednesday 7:30 AM to 4:00 PM (Last update: 12/04/2021) ______________________________________________________________________    ____________________________________________________________________________________________  Drug Holidays  What is a "Drug Holiday"? Drug Holiday: is the name given to the process of slowly tapering down and temporarily stopping the pain medication for the purpose of decreasing or eliminating tolerance to the drug.  Benefits Improved effectiveness Decreased required effective dose Improved pain control End dependence on high dose therapy Decrease cost of therapy Uncovering "opioid-induced hyperalgesia". (OIH)  What is "opioid hyperalgesia"? It is a paradoxical increase in pain caused by exposure to opioids. Stopping the opioid pain medication, contrary to the expected, it actually decreases or completely eliminates the pain. Ref.: "A comprehensive review of opioid-induced hyperalgesia". Marion Lee, et.al. Pain Physician. 2011 Mar-Apr;14(2):145-61.  What is tolerance? Tolerance: the progressive loss of effectiveness of a pain medicine due to repetitive use. A common problem of opioid pain medications.  How long should a "Drug  Holiday" last? Effectiveness depends on the patient staying off all opioid pain medicines for a minimum of 14 consecutive days. (2 weeks)  How about just taking less of the medicine? Does not   work. Will not accomplish goal of eliminating the excess receptors.  How about switching to a different pain medicine? (AKA. "Opioid rotation") Does not work. Creates the illusion of effectiveness by taking advantage of inaccurate equivalent dose calculations between different opioids. -This "technique" was promoted by studies funded by pharmaceutical companies, such as [employer], creators of "OxyContin".  Can I stop the medicine "cold turkey"? We do not recommend it. You should always coordinate with your prescribing physician to make the transition as smoothly as possible. Avoid stopping the medicine abruptly without consulting. We recommend a "slow taper".  What is a slow taper? Taper: refers to the gradual decrease in dose.   How do I stop/taper the dose? Slowly. Decrease the daily amount of pills that you take by one (1) pill every seven (7) days. This is called a "slow downward taper". Example: if you normally take four (4) pills per day, drop it to three (3) pills per day for seven (7) days, then to two (2) pills per day for seven (7) days, then to one (1) per day for seven (7) days, and then stop the medicine. The 14 day "Drug Holiday" starts on the first day without medicine.   Will I experience withdrawals? Unlikely with a slow taper.  What triggers withdrawals? Withdrawals are triggered by the sudden/abrupt stop of high dose opioids. Withdrawals can be avoided by slowly decreasing the dose over a prolonged period of time.  What are withdrawals? Symptoms associated with sudden/abrupt reduction/stopping of high-dose, long-term use of pain medication. Withdrawal are seldom seen on low dose therapy, or patients rarely taking opioid medication.  Early Withdrawal Symptoms may  include: Agitation Anxiety Muscle aches Increased tearing Insomnia Runny nose Sweating Yawning  Late symptoms may include: Abdominal cramping Diarrhea Dilated pupils Goose bumps Nausea Vomiting  When could I see withdrawals? Onset: 8-24 hours after last use for most opioids. 12-48 hours for long-acting opioids (i.e.: methadone)  How long could they last? Duration: 4-10 days for most opioids. 14-21 days for long-acting opioids (i.e.: methadone)  What will happen after I complete my "Drug Holiday"? The need and indications for the opioid analgesic will be reviewed before restarting the medication. Dose requirements will likely decrease and the dose will need to be adjusted accordingly.   (Last update: 05/01/2022) ____________________________________________________________________________________________    ____________________________________________________________________________________________  WARNING: CBD (cannabidiol) & Delta (Delta-8 tetrahydrocannabinol) products.   Applicable to:  All individuals currently taking or considering taking CBD (cannabidiol) and, more important, all patients taking opioid analgesic controlled substances (pain medication). (Example: oxycodone; oxymorphone; hydrocodone; hydromorphone; morphine; methadone; tramadol; tapentadol; fentanyl; buprenorphine; butorphanol; dextromethorphan; meperidine; codeine; etc.)  Introduction:  Recently there has been a drive towards the use of "natural" products for the treatment of different conditions, including pain anxiety and sleep disorders. Marijuana and hemp are two varieties of the cannabis genus plants. Marijuana and its derivatives are illegal, while hemp and its derivatives are not. Cannabidiol (CBD) and tetrahydrocannabinol (THC), are two natural compounds found in plants of the Cannabis genus. They can both be extracted from hemp or marijuana. Both compounds interact with your body's endocannabinoid  system in very different ways. CBD is associated with pain relief (analgesia) while THC is associated with the psychoactive effects ("the high") obtained from the use of marijuana products. There are two main types of THC: Delta-9, which comes from the marijuana plant and it is illegal, and Delta-8, which comes from the hemp plant, and it is legal. (Both, Delta-9-THC and Delta-8-THC are psychoactive and   give you "the high".)   Legality:  Marijuana and its derivatives: illegal Hemp and its derivatives: Legal (State dependent) UPDATE: (03/30/2021) The Drug Enforcement Agency (DEA) issued a letter stating that "delta" cannabinoids, including Delta-8-THCO and Delta-9-THCO, synthetically derived from hemp do not qualify as hemp and will be viewed as Schedule I drugs. (Schedule I drugs, substances, or chemicals are defined as drugs with no currently accepted medical use and a high potential for abuse. Some examples of Schedule I drugs are: heroin, lysergic acid diethylamide (LSD), marijuana (cannabis), 3,4-methylenedioxymethamphetamine (ecstasy), methaqualone, and peyote.) (https://www.dea.gov)  Legal status of CBD in [place]:  "Conditionally Legal"  Reference: "FDA Regulation of Cannabis and Cannabis-Derived Products, Including Cannabidiol (CBD)" - https://www.fda.gov/news-events/public-health-focus/fda-regulation-cannabis-and-cannabis-derived-products-including-cannabidiol-cbd  Warning:  CBD is not FDA approved and has not undergo the same manufacturing controls as prescription drugs.  This means that the purity and safety of available CBD may be questionable. Most of the time, despite manufacturer's claims, it is contaminated with THC (delta-9-tetrahydrocannabinol - the chemical in marijuana responsible for the "HIGH").  When this is the case, the THC contaminant will trigger a positive urine drug screen (UDS) test for Marijuana (carboxy-THC).   The FDA recently put out a warning about 5 things that everyone  should be aware of regarding Delta-8 THC: Delta-8 THC products have not been evaluated or approved by the FDA for safe use and may be marketed in ways that put the public health at risk. The FDA has received adverse event reports involving delta-8 THC-containing products. Delta-8 THC has psychoactive and intoxicating effects. Delta-8 THC manufacturing often involve use of potentially harmful chemicals to create the concentrations of delta-8 THC claimed in the marketplace. The final delta-8 THC product may have potentially harmful by-products (contaminants) due to the chemicals used in the process. Manufacturing of delta-8 THC products may occur in uncontrolled or unsanitary settings, which may lead to the presence of unsafe contaminants or other potentially harmful substances. Delta-8 THC products should be kept out of the reach of children and pets.  NOTE: Because a positive UDS for any illicit substance is a violation of our medication agreement, your opioid analgesics (pain medicine) may be permanently discontinued.  MORE ABOUT CBD  General Information: CBD was discovered in 1940 and it is a derivative of the cannabis sativa genus plants (Marijuana and Hemp). It is one of the 113 identified substances found in Marijuana. It accounts for up to 40% of the plant's extract. As of 2018, preliminary clinical studies on CBD included research for the treatment of anxiety, movement disorders, and pain. CBD is available and consumed in multiple forms, including inhalation of smoke or vapor, as an aerosol spray, and by mouth. It may be supplied as an oil containing CBD, capsules, dried cannabis, or as a liquid solution. CBD is thought not to be as psychoactive as THC (delta-9-tetrahydrocannabinol - the chemical in marijuana responsible for the "HIGH"). Studies suggest that CBD may interact with different biological target receptors in the body, including cannabinoid and other neurotransmitter receptors. As of  2018 the mechanism of action for its biological effects has not been determined.  Side-effects  Adverse reactions: Dry mouth, diarrhea, decreased appetite, fatigue, drowsiness, malaise, weakness, sleep disturbances, and others.  Drug interactions:  CBD may interact with medications such as blood-thinners. CBD causes drowsiness on its own and it will increase drowsiness caused by other medications, including antihistamines (such as Benadryl), benzodiazepines (Xanax, Ativan, Valium), antipsychotics, antidepressants, opioids, alcohol and supplements such as kava, melatonin and St. John's Wort.    Other drug interactions: Brivaracetam (Briviact); Caffeine; Carbamazepine (Tegretol); Citalopram (Celexa); Clobazam (Onfi); Eslicarbazepine (Aptiom); Everolimus (Zostress); Lithium; Methadone (Dolophine); Rufinamide (Banzel); Sedative medications (CNS depressants); Sirolimus (Rapamune); Stiripentol (Diacomit); Tacrolimus (Prograf); Tamoxifen ; Soltamox); Topiramate (Topamax); Valproate; Warfarin (Coumadin); Zonisamide. (Last update: 01/21/2022) ____________________________________________________________________________________________   ____________________________________________________________________________________________  Naloxone Nasal Spray  Why am I receiving this medication? [place] STOP ACT requires that all patients taking high dose opioids or at risk of opioids respiratory depression, be prescribed an opioid reversal agent, such as Naloxone (AKA: Narcan).  What is this medication? NALOXONE (nal OX one) treats opioid overdose, which causes slow or shallow breathing, severe drowsiness, or trouble staying awake. Call emergency services after using this medication. You may need additional treatment. Naloxone works by reversing the effects of opioids. It belongs to a group of medications called opioid blockers.  COMMON BRAND NAME(S): Kloxxado, Narcan  What should I tell my care team before  I take this medication? They need to know if you have any of these conditions: Heart disease Substance use disorder An unusual or allergic reaction to naloxone, other medications, foods, dyes, or preservatives Pregnant or trying to get pregnant Breast-feeding  When to use this medication? This medication is to be used for the treatment of respiratory depression (less than 8 breaths per minute) secondary to opioid overdose.   How to use this medication? This medication is for use in the nose. Lay the person on their back. Support their neck with your hand and allow the head to tilt back before giving the medication. The nasal spray should be given into 1 nostril. After giving the medication, move the person onto their side. Do not remove or test the nasal spray until ready to use. Get emergency medical help right away after giving the first dose of this medication, even if the person wakes up. You should be familiar with how to recognize the signs and symptoms of a narcotic overdose. If more doses are needed, give the additional dose in the other nostril. Talk to your care team about the use of this medication in children. While this medication may be prescribed for children as young as newborns for selected conditions, precautions do apply.  Naloxone Overdosage: If you think you have taken too much of this medicine contact a poison control center or emergency room at once.  NOTE: This medicine is only for you. Do not share this medicine with others.  What if I miss a dose? This does not apply.  What may interact with this medication? This is only used during an emergency. No interactions are expected during emergency use. This list may not describe all possible interactions. Give your health care provider a list of all the medicines, herbs, non-prescription drugs, or dietary supplements you use. Also tell them if you smoke, drink alcohol, or use illegal drugs. Some items may interact with  your medicine.  What should I watch for while using this medication? Keep this medication ready for use in the case of an opioid overdose. Make sure that you have the phone number of your care team and local hospital ready. You may need to have additional doses of this medication. Each nasal spray contains a single dose. Some emergencies may require additional doses. After use, bring the treated person to the nearest hospital or call 911. Make sure the treating care team knows that the person has received a dose of this medication. You will receive additional instructions on what to do during and after use of this   medication before an emergency occurs.  What side effects may I notice from receiving this medication? Side effects that you should report to your care team as soon as possible: Allergic reactions--skin rash, itching, hives, swelling of the face, lips, tongue, or throat Side effects that usually do not require medical attention (report these to your care team if they continue or are bothersome): Constipation Dryness or irritation inside the nose Headache Increase in blood pressure Muscle spasms Stuffy nose Toothache This list may not describe all possible side effects. Call your doctor for medical advice about side effects. You may report side effects to FDA at 1-800-FDA-1088.  Where should I keep my medication? Because this is an emergency medication, you should keep it with you at all times.  Keep out of the reach of children and pets. Store between 20 and 25 degrees C (68 and 77 degrees F). Do not freeze. Throw away any unused medication after the expiration date. Keep in original box until ready to use.  NOTE: This sheet is a summary. It may not cover all possible information. If you have questions about this medicine, talk to your doctor, pharmacist, or health care provider.   2023 Elsevier/Gold Standard (2020-10-06  00:00:00)  ____________________________________________________________________________________________   

## 2022-06-03 ENCOUNTER — Other Ambulatory Visit: Payer: Self-pay | Admitting: Family Medicine

## 2022-06-03 DIAGNOSIS — J432 Centrilobular emphysema: Secondary | ICD-10-CM

## 2022-06-04 NOTE — Telephone Encounter (Signed)
Requested Prescriptions  Pending Prescriptions Disp Refills   SPIRIVA RESPIMAT 2.5 MCG/ACT AERS [Pharmacy Med Name: SPIRIVA RESPIMAT 2.5 MCG/ACT INH AE] 4 g 2    Sig: INHALE 2 PUFFS BY MOUTH INTO THE LUNGS DAILY     Pulmonology:  Anticholinergic Agents Passed - 06/03/2022 11:08 AM      Passed - Valid encounter within last 12 months    Recent Outpatient Visits           6 months ago Tinea pedis of right foot   [facility] [place], Netta Neat, DO   10 months ago Benign hypertension with CKD (chronic kidney disease) stage III [facility])   [facility] [place], Netta Neat, DO   1 year ago Chronic right-sided low back pain with bilateral sciatica   [facility] Smitty Cords, DO   1 year ago Acute intractable headache, unspecified headache type    [facility] [place], Salvadore Oxford, NP   1 year ago Annual physical exam   [facility] [place], Netta Neat, [place]

## 2022-06-11 ENCOUNTER — Ambulatory Visit (INDEPENDENT_AMBULATORY_CARE_PROVIDER_SITE_OTHER): Payer: 59 | Admitting: Neurosurgery

## 2022-06-11 ENCOUNTER — Encounter: Payer: Self-pay | Admitting: Neurosurgery

## 2022-06-11 VITALS — BP 140/76 | HR 68 | Ht 71.5 in | Wt 190.0 lb

## 2022-06-11 DIAGNOSIS — M4316 Spondylolisthesis, lumbar region: Secondary | ICD-10-CM | POA: Diagnosis not present

## 2022-06-11 DIAGNOSIS — M48062 Spinal stenosis, lumbar region with neurogenic claudication: Secondary | ICD-10-CM

## 2022-06-11 DIAGNOSIS — M4310 Spondylolisthesis, site unspecified: Secondary | ICD-10-CM

## 2022-06-11 NOTE — Progress Notes (Signed)
Referring Physician:  Smitty Cords, DO [address] [place],  [place] [address]  Primary Physician:  Smitty Cords, DO  History of Present Illness: 06/11/2022 Mr. Borba is doing somewhat better.  He has been doing physical therapy.  He reports approximately 20% improvement in his symptoms.  He is happy without improvement and would like to hold off on further intervention at this time.   04/09/2022 Mr. Mathayus Stanbery is here today with a chief complaint of back and leg pain.  This has been ongoing for 2 years but worsening more recently.  His pain is as bad as 10 out of 10 and made worse by walking and standing.  He did have an injection recently which has helped.  He has tried medication management which has helped a small amount.   Bowel/Bladder Dysfunction: none  Conservative measures:  Physical therapy:  has not participated  Multimodal medical therapy including regular antiinflammatories:  gabapentin, meloxicam, oxycodone  Injections:  has received epidural steroid injections by Dr. Laban Emperor  03/12/22: Right L4-5 IL ESI 10/18/21: Right L2, L3, L4, L5, S1 Radiofrequency Ablation 09/13/21: Right L2, L3, L4, L5, & S1 Medial Branch Block 08/09/21: Bilateral L2, L3, L4, L5, & S1 Medial Branch Block and a Bilateral SI joint injection  Past Surgery: denies  DAYDEN VIVERETTE has no symptoms of cervical myelopathy.  The symptoms are causing a significant impact on the patient's life.   I have utilized the care everywhere function in epic to review the outside records available from external health systems.  Review of Systems:  A 10 point review of systems is negative, except for the pertinent positives and negatives detailed in the HPI.  Past Medical History: Past Medical History:  Diagnosis Date   Abdominal aortic aneurysm (AAA) (HCC) 2013   found at [facility]-   Anxiety    Arrhythmia    Arthritis    Colon polyp    COPD (chronic obstructive pulmonary disease) (HCC)     Coronary artery disease    Depression    GERD (gastroesophageal reflux disease)    Hyperlipidemia    Hypertension    Skin cancer    Thrush     Past Surgical History: Past Surgical History:  Procedure Laterality Date   CATARACT EXTRACTION W/PHACO Left 05/09/2020   Procedure: CATARACT EXTRACTION PHACO AND INTRAOCULAR LENS PLACEMENT (IOC) LEFT DIABETIC 9.09 01:02.0;  Surgeon: Galen Manila, MD;  [facility];  Service: Ophthalmology;  Laterality: Left;   CATARACT EXTRACTION W/PHACO Right 05/23/2020   Procedure: CATARACT EXTRACTION PHACO AND INTRAOCULAR LENS PLACEMENT (IOC) RIGHT DIABETIC 8.13 01:00.3;  Surgeon: Galen Manila, MD;  Location: [facility];  Service: Ophthalmology;  Laterality: Right;   Colon polyp surgery     SKIN CANCER EXCISION     TRANSURETHRAL RESECTION OF PROSTATE  2010    Allergies: Allergies as of 06/11/2022 - Review Complete 06/11/2022  Allergen Reaction Noted   Nexium [esomeprazole magnesium]  12/22/2020    Medications: Current Meds  Medication Sig   albuterol (VENTOLIN HFA) 108 (90 Base) MCG/ACT inhaler Inhale 2 puffs into the lungs every 6 (six) hours as needed for wheezing or shortness of breath.   amLODipine (NORVASC) 5 MG tablet TAKE 1 TABLET BY MOUTH DAILY   aspirin EC 81 MG tablet Take 162 mg by mouth every evening.   cyanocobalamin (VITAMIN B12) 1000 MCG tablet Take 1,000 mcg by mouth daily.   FLUoxetine (PROZAC) 20 MG capsule TAKE ONE CAPSULE BY MOUTH  TWICE A DAY   fluticasone (FLONASE) 50 MCG/ACT nasal spray PLACE 2 SPRAYS INTO BOTH NOSTRILS DAILY   furosemide (LASIX) 20 MG tablet Take 1 tablet (20 mg total) by mouth every other day.   LORazepam (ATIVAN) 0.5 MG tablet TAKE 1 TABLET BY MOUTH AT BEDTIME AS NEEDED FOR ANXIETY   losartan (COZAAR) 25 MG tablet TAKE 1 TABLET BY MOUTH DAILY   meloxicam (MOBIC) 15 MG tablet TAKE 1 TABLET BY MOUTH DAILY AS NEEDED FOR PAIN   oxyCODONE (OXY IR/ROXICODONE) 5 MG immediate release  tablet Take 1 tablet (5 mg total) by mouth 2 (two) times daily as needed for severe pain. Each refill must last 30 days.   [START ON 06/29/2022] oxyCODONE (OXY IR/ROXICODONE) 5 MG immediate release tablet Take 1 tablet (5 mg total) by mouth 2 (two) times daily as needed for severe pain. Each refill must last 30 days.   [START ON 07/29/2022] oxyCODONE (OXY IR/ROXICODONE) 5 MG immediate release tablet Take 1 tablet (5 mg total) by mouth 2 (two) times daily as needed for severe pain. Each refill must last 30 days.   pantoprazole (PROTONIX) 20 MG tablet Take 1 tablet by mouth daily.   potassium chloride (MICRO-K) 10 MEQ CR capsule TAKE 1 CAPSULE BY MOUTH EVERY OTHER DAY   simvastatin (ZOCOR) 20 MG tablet TAKE ONE TABLET BY MOUTH AT BEDTIME   SPIRIVA RESPIMAT 2.5 MCG/ACT AERS INHALE 2 PUFFS BY MOUTH INTO THE LUNGS DAILY   tamsulosin (FLOMAX) 0.4 MG CAPS capsule TAKE 1 CAPSULE BY MOUTH DAILY AFTER BREAKFAST    Social History: Social History   Tobacco Use   Smoking status: Every Day    Packs/day: 1.00    Years: 70.00    Additional pack years: 0.00    Total pack years: 70.00    Types: Cigarettes   Smokeless tobacco: Never  Vaping Use   Vaping Use: Never used  Substance Use Topics   Alcohol use: No   Drug use: No    Family Medical History: Family History  Problem Relation Age of Onset   Kidney cancer Mother    Prostate cancer Neg Hx    Bladder Cancer Neg Hx     Physical Examination: Vitals:   06/11/22 0932  BP: (!) 140/76  Pulse: 68  SpO2: 99%    General: Patient is well developed, well nourished, calm, collected, and in no apparent distress. Attention to examination is appropriate.  Neck:   Supple.  Full range of motion.  Respiratory: Patient is breathing without any difficulty.   NEUROLOGICAL:     Awake, alert, oriented to person, place, and time.  Speech is clear and fluent.   Cranial Nerves: Pupils equal round and reactive to light.  Facial tone is symmetric.  Facial  sensation is symmetric. Shoulder shrug is symmetric. Tongue protrusion is midline.  There is no pronator drift.  ROM of spine: full.    Strength: Side Biceps Triceps Deltoid Interossei Grip Wrist Ext. Wrist Flex.  R 5 5 5 5 5 5 5   L 5 5 5 5 5 5 5    Side Iliopsoas Quads Hamstring PF DF EHL  R 5 5 5 5 5 5   L 5 5 5 5 5 5    Reflexes are 1+ and symmetric at the biceps, triceps, brachioradialis, patella and achilles.   Hoffman's is absent.   Bilateral upper and lower extremity sensation is intact to light touch.    No evidence of dysmetria noted.  Gait is abnormal-he walks with  a cane.     Medical Decision Making  Imaging: MRI L spine 03/08/2022 IMPRESSION: Lumbar spine degeneration which is focally severe at L4-5 where there is degenerative cyst and superior migrating disc extrusion. Advanced spinal stenosis and right L4 impingement at this level.     Electronically Signed   By: Tiburcio Pea M.D.   On: 03/11/2022 06:38  I have personally reviewed the images and agree with the above interpretation.  Assessment and Plan: Mr. Interrante is a pleasant 82 y.o. 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  He has back pain from this as well.  He has had some improvement with physical therapy.  He would like to continue this for now.  I will see him back in 2 months to reevaluate.    At that time, if he is not better, we will discuss L4-5 decompression versus a more extensive intervention.  I would likely favor an L4-5 decompression as a for start should he need surgical intervention.  We discussed cessation of smoking.  He will continue for now.  I spent a total of 10 minutes in this patient's care today. This time was spent reviewing pertinent records including imaging studies, obtaining and confirming history, performing a directed evaluation, formulating and discussing my recommendations, and documenting the visit within the  medical record.    Thank you for involving me in the care of this patient.      Oland Arquette K. Myer Haff MD, [facility] Neurosurgery

## 2022-06-17 ENCOUNTER — Other Ambulatory Visit: Payer: Self-pay | Admitting: Family Medicine

## 2022-06-17 DIAGNOSIS — N183 Chronic kidney disease, stage 3 unspecified: Secondary | ICD-10-CM

## 2022-06-18 NOTE — Telephone Encounter (Signed)
Called pt - unable to leave message. Voice mail not set up.

## 2022-06-18 NOTE — Telephone Encounter (Signed)
Courtesy refill. Patient will need an office visit for further refills. Requested Prescriptions  Pending Prescriptions Disp Refills   amLODipine (NORVASC) 5 MG tablet [Pharmacy Med Name: AMLODIPINE BESYLATE 5 MG TAB] 30 tablet 0    Sig: TAKE 1 TABLET BY MOUTH DAILY     Cardiovascular: Calcium Channel Blockers 2 Failed - 06/17/2022 11:19 AM      Failed - Last BP in normal range    BP Readings from Last 1 Encounters:  06/11/22 (!) 140/76         Failed - Valid encounter within last 6 months    Recent Outpatient Visits           6 months ago Tinea pedis of right foot   [place] [facility] [place], Netta Neat, DO   10 months ago Benign hypertension with CKD (chronic kidney disease) stage III [facility])   [place] [facility] [place], Netta Neat, DO   1 year ago Chronic right-sided low back pain with bilateral sciatica   [place] [facility] Smitty Cords, DO   1 year ago Acute intractable headache, unspecified headache type   [place] [facility] [place], Salvadore Oxford, NP   1 year ago Annual physical exam   [place] [facility] Smitty Cords, DO              Passed - Last Heart Rate in normal range    Pulse Readings from Last 1 Encounters:  06/11/22 68

## 2022-06-26 ENCOUNTER — Encounter: Payer: Self-pay | Admitting: Family Medicine

## 2022-06-26 ENCOUNTER — Ambulatory Visit (INDEPENDENT_AMBULATORY_CARE_PROVIDER_SITE_OTHER): Payer: 59 | Admitting: Family Medicine

## 2022-06-26 ENCOUNTER — Other Ambulatory Visit: Payer: Self-pay | Admitting: Family Medicine

## 2022-06-26 VITALS — BP 120/62 | HR 48 | Ht 71.5 in | Wt 203.0 lb

## 2022-06-26 DIAGNOSIS — E785 Hyperlipidemia, unspecified: Secondary | ICD-10-CM

## 2022-06-26 DIAGNOSIS — L219 Seborrheic dermatitis, unspecified: Secondary | ICD-10-CM

## 2022-06-26 DIAGNOSIS — E559 Vitamin D deficiency, unspecified: Secondary | ICD-10-CM

## 2022-06-26 DIAGNOSIS — I10 Essential (primary) hypertension: Secondary | ICD-10-CM

## 2022-06-26 DIAGNOSIS — N183 Hypertensive chronic kidney disease with stage 1 through stage 4 chronic kidney disease, or unspecified chronic kidney disease: Secondary | ICD-10-CM

## 2022-06-26 DIAGNOSIS — M5137 Other intervertebral disc degeneration, lumbosacral region: Secondary | ICD-10-CM

## 2022-06-26 DIAGNOSIS — R7303 Prediabetes: Secondary | ICD-10-CM

## 2022-06-26 DIAGNOSIS — R809 Proteinuria, unspecified: Secondary | ICD-10-CM

## 2022-06-26 DIAGNOSIS — G894 Chronic pain syndrome: Secondary | ICD-10-CM

## 2022-06-26 DIAGNOSIS — I129 Hypertensive chronic kidney disease with stage 1 through stage 4 chronic kidney disease, or unspecified chronic kidney disease: Secondary | ICD-10-CM

## 2022-06-26 DIAGNOSIS — F5105 Insomnia due to other mental disorder: Secondary | ICD-10-CM

## 2022-06-26 DIAGNOSIS — R600 Localized edema: Secondary | ICD-10-CM | POA: Diagnosis not present

## 2022-06-26 DIAGNOSIS — N138 Other obstructive and reflux uropathy: Secondary | ICD-10-CM

## 2022-06-26 DIAGNOSIS — F419 Anxiety disorder, unspecified: Secondary | ICD-10-CM

## 2022-06-26 DIAGNOSIS — J432 Centrilobular emphysema: Secondary | ICD-10-CM

## 2022-06-26 DIAGNOSIS — E538 Deficiency of other specified B group vitamins: Secondary | ICD-10-CM

## 2022-06-26 DIAGNOSIS — F99 Mental disorder, not otherwise specified: Secondary | ICD-10-CM

## 2022-06-26 MED ORDER — FUROSEMIDE 20 MG PO TABS: 20.0000 mg | ORAL_TABLET | ORAL | 1 refills | Status: DC

## 2022-06-26 MED ORDER — AMLODIPINE BESYLATE 5 MG PO TABS: 5.0000 mg | ORAL_TABLET | Freq: Every day | ORAL | 1 refills | Status: DC

## 2022-06-26 MED ORDER — SIMVASTATIN 20 MG PO TABS
20.0000 mg | ORAL_TABLET | Freq: Every day | ORAL | 1 refills | Status: DC
Start: 2022-06-26 — End: 2023-04-18

## 2022-06-26 MED ORDER — LORAZEPAM 0.5 MG PO TABS: 0.5000 mg | ORAL_TABLET | Freq: Every day | ORAL | 5 refills | Status: DC

## 2022-06-26 MED ORDER — LOSARTAN POTASSIUM 25 MG PO TABS: 25.0000 mg | ORAL_TABLET | Freq: Every day | ORAL | 1 refills | Status: DC

## 2022-06-26 NOTE — Patient Instructions (Addendum)
Thank you for coming to the office today.  Lorazepam re ordered  All other meds, BP cholesterol refilled for 90 days + 1 refill  No changes to current medications.  Try the OTC Head & Shoulders Dandruff shampoo to see if it helps the scalp.  If not resolving we can upgrade to stronger rx, call back if needed.   DUE for FASTING BLOOD WORK (no food or drink after midnight before the lab appointment, only water or coffee without cream/sugar on the morning of)  SCHEDULE "Lab Only" visit in the morning at the clinic for lab draw in 5/17 Friday 11am  - Make sure Lab Only appointment is at about 1 week before your next appointment, so that results will be available  For Lab Results, once available within 2-3 days of blood draw, you can can log in to MyChart online to view your results and a brief explanation. Also, we can discuss results at next follow-up visit.   Please schedule a Follow-up Appointment to: Return in about 2 days (around 06/28/2022) for Fri 5/17 fasting lab only 11am.  Next will be 6 month Follow-up.  If you have any other questions or concerns, please feel free to call the office or send a message through MyChart. You may also schedule an earlier appointment if necessary.  Additionally, you may be receiving a survey about your experience at our office within a few days to 1 week by e-mail or mail. We value your feedback.  Saralyn Pilar, DO [place], [place]

## 2022-06-26 NOTE — Progress Notes (Signed)
Subjective:    Patient ID: Charles Ewing, male    DOB: 1941-01-08, 82 y.o.   MRN: [id number]  Charles Ewing is a 82 y.o. male presenting on 06/26/2022 for Back Pain   HPI  Chronic Low Back Pain, with bilateral sciatica Describes chronic low back pain, with radiating pain into both legs with sciatica, seems to be fairly persistent. He has followed with [facility], and has seen Dr Sadie Haber, he has had an ESI injection 1.5 month without relief. - He admits difficulty with ambulation and has less functional, limited for ADL function. He has son that helps him now. Followed by Pain Management on medication therapy including opiate w Oxycodone. Previously w [facility]  He is followed by [facility] Pain Management Dr Laban Emperor and also Dr Myer Haff [facility] Neurosurgery   HTN CKD CAD Lower Extremity Edema He is having episodic BP issues with lower BP at times. He takes all meds regularly and even when not having lower ext edema still takes diuretic. Has most of his medications with him today but not all. BP has been variable with some low readings. No longer following w/ Cardiology [facility] Cards - has been >1 year since last visit  Pre-Diabetes Known Pre-Diabetes A1c 5.8 CBGs: checks at home, infrequently, but he has readings ranging mid 100s, avg 120-140, high of 200, no hypoglycemia or low sugars Meds: Metformin XR 500mg  daily Currently not on ACEi / ARB - prior elevated microalbumin Lifestyle: - Diet (Tries to improve diet) - Exercise (Limited - due to knee pain) - Fam history of DM Denies hypoglycemia, polyuria, visual changes, numbness or tingling.  Centrilobular Emphysema (COPD) Currently doing well. No new concern or recent flare up. He has maintenance therapy. Spiriva, Flovent Active smoker. Not ready to quit   HYPERLIPIDEMIA: - Reports no concerns. Last lipid panel 10/2020, controlled  - Currently taking Simvastatin, tolerating well without side effects or  myalgias  Chronic Anxiety Associated with PTSD Currently controlled on Lorazepam 0.5mg  nightly for insomnia and anxiety Currently on Fluoxetone 20mg  TWICE A DAY Due for refill on Lorazepam  Seb Derm Scalp itching flaking scalp and forehead skin AKs   Health Maintenance:  Decline Shingles vaccine.     05/27/2022   12:39 PM 03/26/2022    2:20 PM 11/29/2021    2:07 PM  Depression screen PHQ 2/9  Decreased Interest 1 0 0  Down, Depressed, Hopeless 0 0 0  PHQ - 2 Score 1 0 0    Social History   Tobacco Use   Smoking status: Every Day    Packs/day: 1.00    Years: 70.00    Additional pack years: 0.00    Total pack years: 70.00    Types: Cigarettes   Smokeless tobacco: Never  Vaping Use   Vaping Use: Never used  Substance Use Topics   Alcohol use: No   Drug use: No    Review of Systems Per HPI unless specifically indicated above     Objective:    BP 120/62   Pulse (!) 48   Ht 5' 11.5" (1.816 m)   Wt 203 lb (92.1 kg)   SpO2 97%   BMI 27.92 kg/m   Wt Readings from Last 3 Encounters:  06/26/22 203 lb (92.1 kg)  06/11/22 190 lb (86.2 kg)  05/27/22 190 lb (86.2 kg)    Physical Exam Vitals and nursing note reviewed.  Constitutional:      General: He is not in acute distress.    Appearance:  He is well-developed. He is not diaphoretic.     Comments: Well-appearing, comfortable, cooperative  HENT:     Head: Normocephalic and atraumatic.  Eyes:     General:        Right eye: No discharge.        Left eye: No discharge.     Conjunctiva/sclera: Conjunctivae normal.  Neck:     Thyroid: No thyromegaly.  Cardiovascular:     Rate and Rhythm: Normal rate and regular rhythm.     Pulses: Normal pulses.     Heart sounds: Normal heart sounds. No murmur heard. Pulmonary:     Effort: Pulmonary effort is normal. No respiratory distress.     Breath sounds: Normal breath sounds. No wheezing or rales.  Musculoskeletal:        General: Normal range of motion.      Cervical back: Normal range of motion and neck supple.     Right lower leg: No edema.     Left lower leg: No edema.  Lymphadenopathy:     Cervical: No cervical adenopathy.  Skin:    General: Skin is warm and dry.     Findings: No erythema or rash.  Neurological:     Mental Status: He is alert and oriented to person, place, and time. Mental status is at baseline.  Psychiatric:        Behavior: Behavior normal.     Comments: Well groomed, good eye contact, normal speech and thoughts    Results for orders placed or performed in visit on 08/31/21  B12  Result Value Ref Range   Vitamin B-12 1,535 (H) 200 - 1,100 pg/mL      Assessment & Plan:   Problem List Items Addressed This Visit     Chronic pain syndrome - Primary (Chronic)   DDD (degenerative disc disease), lumbosacral (Chronic)   Anxiety   Relevant Medications   LORazepam (ATIVAN) 0.5 MG tablet   Benign hypertension with CKD (chronic kidney disease) stage III (HCC)   Relevant Medications   amLODipine (NORVASC) 5 MG tablet   furosemide (LASIX) 20 MG tablet   losartan (COZAAR) 25 MG tablet   simvastatin (ZOCOR) 20 MG tablet   Bilateral lower extremity edema   Relevant Medications   furosemide (LASIX) 20 MG tablet   Hyperlipidemia   Relevant Medications   amLODipine (NORVASC) 5 MG tablet   furosemide (LASIX) 20 MG tablet   losartan (COZAAR) 25 MG tablet   simvastatin (ZOCOR) 20 MG tablet   Insomnia   Relevant Medications   LORazepam (ATIVAN) 0.5 MG tablet   Other Visit Diagnoses     Seborrheic dermatitis of scalp       Microalbuminuria       Relevant Medications   losartan (COZAAR) 25 MG tablet   Essential hypertension       Relevant Medications   amLODipine (NORVASC) 5 MG tablet   furosemide (LASIX) 20 MG tablet   losartan (COZAAR) 25 MG tablet   simvastatin (ZOCOR) 20 MG tablet       No orders of the defined types were placed in this encounter.  HYPERTENSION Continue current meds  Chronic  Pain LBP Followed by Pain Management / Neurosurgery Upcoming management   Lorazepam re ordered  All other meds, BP cholesterol refilled for 90 days + 1 refill  No changes to current medications.  Seborrheic Dermatitis Try the OTC Head & Shoulders Dandruff shampoo to see if it helps the scalp. If not resolving we can upgrade  to stronger rx topical steroid shampoo vs lotion, call back if needed.  Meds ordered this encounter  Medications   LORazepam (ATIVAN) 0.5 MG tablet    Sig: Take 1 tablet (0.5 mg total) by mouth at bedtime.    Dispense:  30 tablet    Refill:  5   amLODipine (NORVASC) 5 MG tablet    Sig: Take 1 tablet (5 mg total) by mouth daily.    Dispense:  90 tablet    Refill:  1   furosemide (LASIX) 20 MG tablet    Sig: Take 1 tablet (20 mg total) by mouth every other day.    Dispense:  45 tablet    Refill:  1   losartan (COZAAR) 25 MG tablet    Sig: Take 1 tablet (25 mg total) by mouth daily.    Dispense:  90 tablet    Refill:  1    Patient will need an office visit for further refills.   simvastatin (ZOCOR) 20 MG tablet    Sig: Take 1 tablet (20 mg total) by mouth at bedtime.    Dispense:  90 tablet    Refill:  1      Follow up plan: Return in about 2 days (around 06/28/2022) for Fri 5/17 fasting lab only 11am.  Next will be 6 month Follow-up.  Future labs ordered for 06/28/22   Saralyn Pilar, DO [facility] 06/26/2022, 2:36 PM

## 2022-06-28 ENCOUNTER — Other Ambulatory Visit: Payer: 59

## 2022-06-28 DIAGNOSIS — E785 Hyperlipidemia, unspecified: Secondary | ICD-10-CM

## 2022-06-28 DIAGNOSIS — E538 Deficiency of other specified B group vitamins: Secondary | ICD-10-CM

## 2022-06-28 DIAGNOSIS — E559 Vitamin D deficiency, unspecified: Secondary | ICD-10-CM | POA: Diagnosis not present

## 2022-06-28 DIAGNOSIS — I129 Hypertensive chronic kidney disease with stage 1 through stage 4 chronic kidney disease, or unspecified chronic kidney disease: Secondary | ICD-10-CM | POA: Diagnosis not present

## 2022-06-28 DIAGNOSIS — R7303 Prediabetes: Secondary | ICD-10-CM | POA: Diagnosis not present

## 2022-06-28 DIAGNOSIS — N138 Other obstructive and reflux uropathy: Secondary | ICD-10-CM

## 2022-06-28 DIAGNOSIS — N183 Chronic kidney disease, stage 3 unspecified: Secondary | ICD-10-CM | POA: Diagnosis not present

## 2022-06-28 LAB — CBC WITH DIFFERENTIAL/PLATELET
Basophils Relative: 0.4 %
Eosinophils Absolute: 243 cells/uL (ref 15–500)
Hemoglobin: 11.2 g/dL — ABNORMAL LOW (ref 13.2–17.1)
Monocytes Relative: 7.1 %
Platelets: 261 10*3/uL (ref 140–400)
RBC: 4.18 10*6/uL — ABNORMAL LOW (ref 4.20–5.80)

## 2022-06-29 LAB — LIPID PANEL
Cholesterol: 94 mg/dL (ref <200)
HDL: 50 mg/dL (ref >=40)
LDL Cholesterol (Calc): 28 mg/dL (calc)
Non-HDL Cholesterol (Calc): 44 mg/dL (calc) (ref <130)
Total CHOL/HDL Ratio: 1.9 (calc) (ref <5.0)
Triglycerides: 81 mg/dL (ref <150)

## 2022-06-29 LAB — CBC WITH DIFFERENTIAL/PLATELET
Absolute Monocytes: 540 cells/uL (ref 200–950)
Basophils Absolute: 30 cells/uL (ref 0–200)
Eosinophils Relative: 3.2 %
HCT: 36 % — ABNORMAL LOW (ref 38.5–50.0)
Lymphs Abs: 2113 cells/uL (ref 850–3900)
MCH: 26.8 pg — ABNORMAL LOW (ref 27.0–33.0)
MCHC: 31.1 g/dL — ABNORMAL LOW (ref 32.0–36.0)
MCV: 86.1 fL (ref 80.0–100.0)
MPV: 10.2 fL (ref 7.5–12.5)
Neutro Abs: 4674 cells/uL (ref 1500–7800)
Neutrophils Relative %: 61.5 %
RDW: 15.8 % — ABNORMAL HIGH (ref 11.0–15.0)
Total Lymphocyte: 27.8 %
WBC: 7.6 10*3/uL (ref 3.8–10.8)

## 2022-06-29 LAB — PSA: PSA: 11.48 ng/mL — ABNORMAL HIGH (ref <=4.00)

## 2022-06-29 LAB — COMPLETE METABOLIC PANEL WITH GFR
AG Ratio: 2.4 (calc) (ref 1.0–2.5)
ALT: 8 U/L — ABNORMAL LOW (ref 9–46)
AST: 13 U/L (ref 10–35)
Albumin: 3.8 g/dL (ref 3.6–5.1)
Alkaline phosphatase (APISO): 48 U/L (ref 35–144)
BUN: 18 mg/dL (ref 7–25)
CO2: 28 mmol/L (ref 20–32)
Calcium: 8.8 mg/dL (ref 8.6–10.3)
Chloride: 104 mmol/L (ref 98–110)
Creat: 1.18 mg/dL (ref 0.70–1.22)
Globulin: 1.6 g/dL (calc) — ABNORMAL LOW (ref 1.9–3.7)
Glucose, Bld: 103 mg/dL — ABNORMAL HIGH (ref 65–99)
Potassium: 4.9 mmol/L (ref 3.5–5.3)
Sodium: 138 mmol/L (ref 135–146)
Total Bilirubin: 0.5 mg/dL (ref 0.2–1.2)
Total Protein: 5.4 g/dL — ABNORMAL LOW (ref 6.1–8.1)
eGFR: 62 mL/min/{1.73_m2} (ref >=60)

## 2022-06-29 LAB — VITAMIN B12: Vitamin B-12: >2000 pg/mL — ABNORMAL HIGH (ref 200–1100)

## 2022-06-29 LAB — HEMOGLOBIN A1C
Hgb A1c MFr Bld: 5.9 % of total Hgb — ABNORMAL HIGH (ref <5.7)
Mean Plasma Glucose: 123 mg/dL
eAG (mmol/L): 6.8 mmol/L

## 2022-06-29 LAB — VITAMIN D 25 HYDROXY (VIT D DEFICIENCY, FRACTURES): Vit D, 25-Hydroxy: 39 ng/mL (ref 30–100)

## 2022-06-29 LAB — TSH: TSH: 2.08 mIU/L (ref 0.40–4.50)

## 2022-07-02 ENCOUNTER — Other Ambulatory Visit: Payer: Self-pay | Admitting: Family Medicine

## 2022-07-02 DIAGNOSIS — R972 Elevated prostate specific antigen [PSA]: Secondary | ICD-10-CM

## 2022-07-02 DIAGNOSIS — N138 Other obstructive and reflux uropathy: Secondary | ICD-10-CM

## 2022-07-22 IMAGING — DX DG CHEST 1V PORT
1 series · 2 of 2 positions shown · non-contrast
Comparison: Chest radiograph 12/22/2020

CLINICAL DATA: Shortness of breath

EXAM:
PORTABLE CHEST 1 VIEW

[Series 1: chest ap · 0.14mm/px · 2 of 2 slices shown]
[im 1/2]
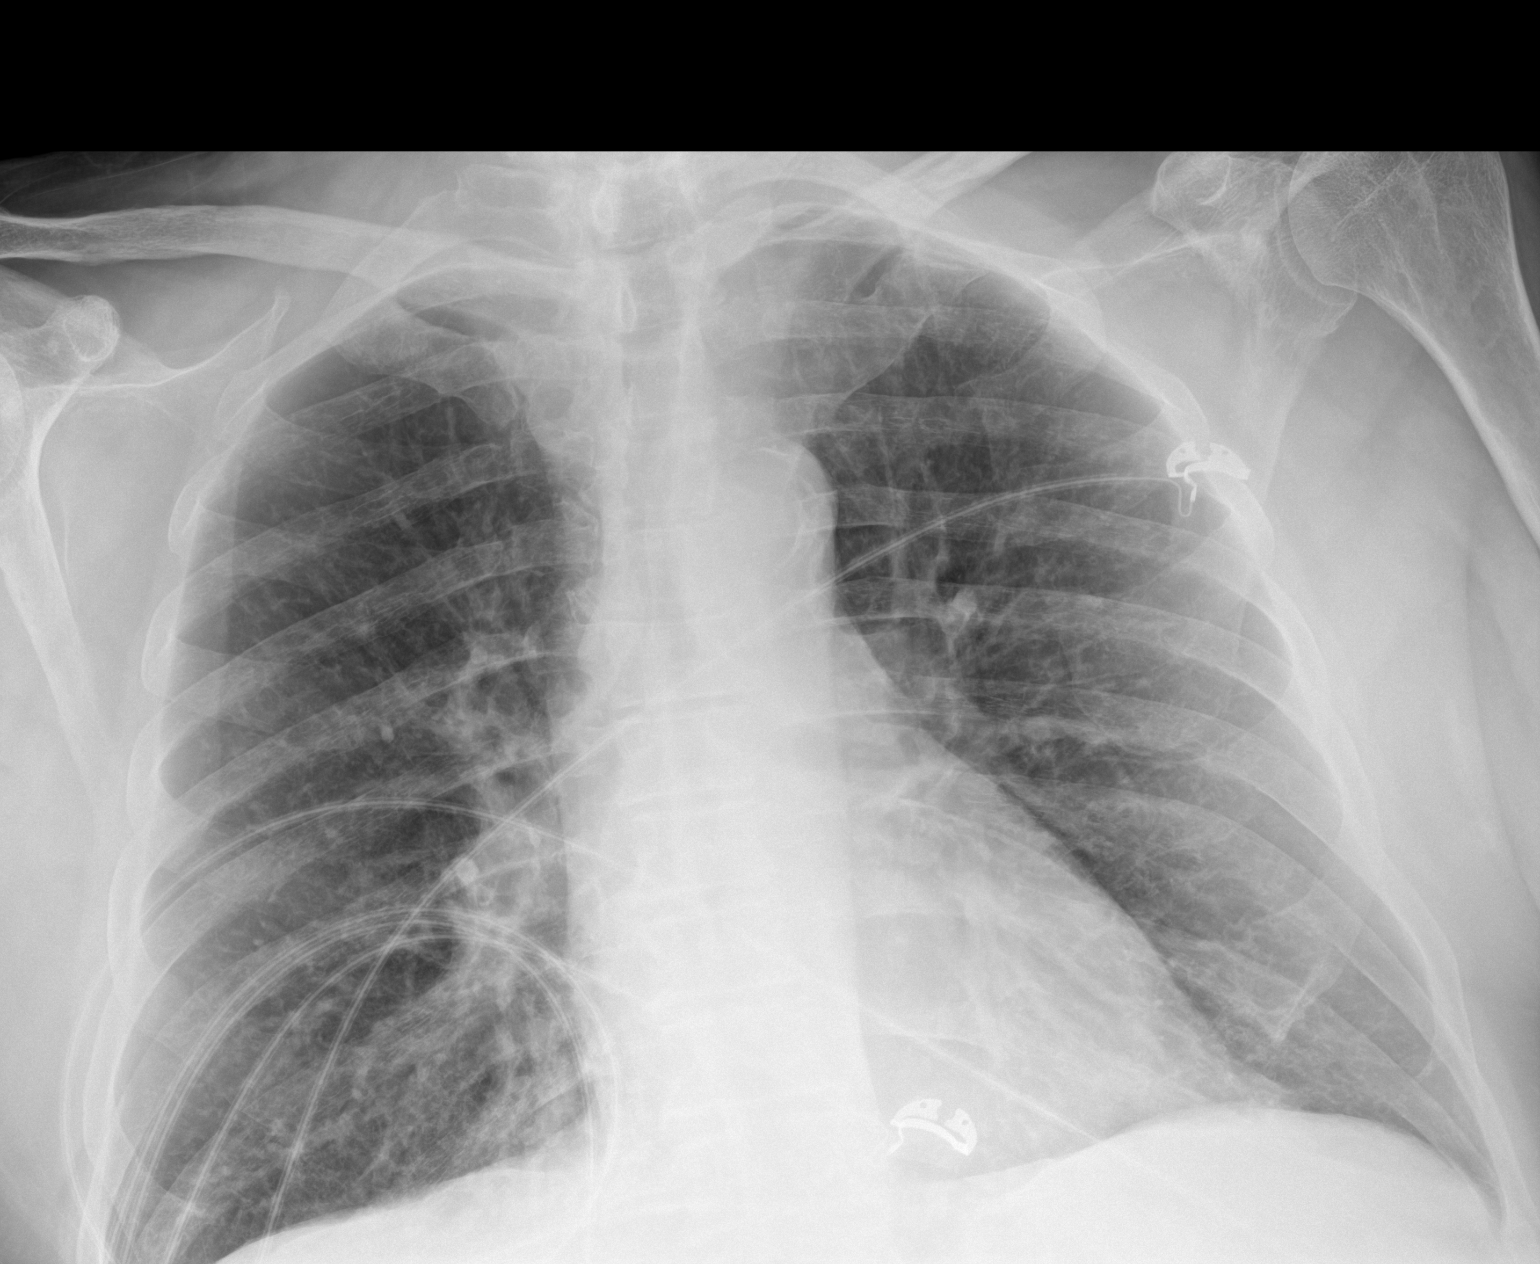
[im 2/2]
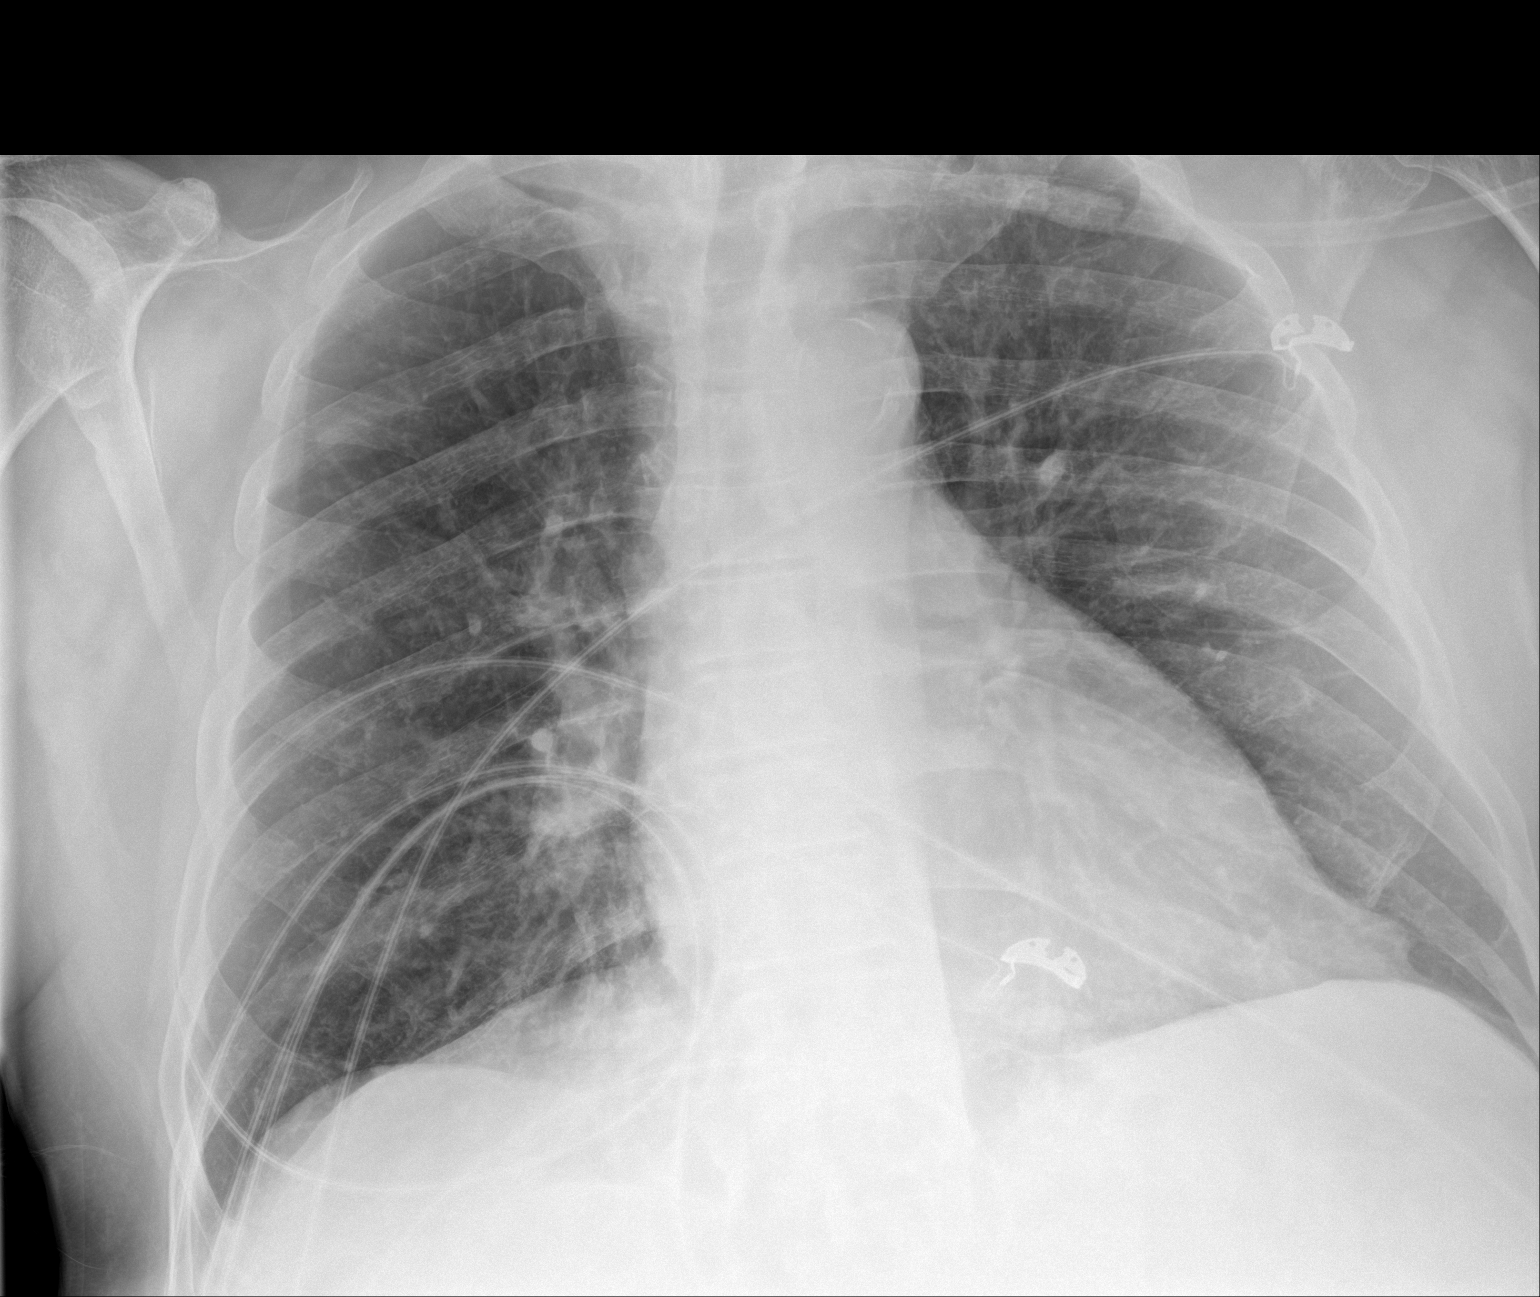

[2 of 2 positions shown; findings below may reference images not displayed]

FINDINGS: The cardiomediastinal silhouette is normal.

There is no focal consolidation or pulmonary edema. There is no
pleural effusion or pneumothorax.

There is no acute osseous abnormality.
IMPRESSION: Stable chest with no radiographic evidence of acute cardiopulmonary
process.

## 2022-07-30 ENCOUNTER — Other Ambulatory Visit: Payer: Self-pay | Admitting: Family Medicine

## 2022-07-30 DIAGNOSIS — I129 Hypertensive chronic kidney disease with stage 1 through stage 4 chronic kidney disease, or unspecified chronic kidney disease: Secondary | ICD-10-CM

## 2022-07-30 NOTE — Telephone Encounter (Signed)
Requested Prescriptions  Pending Prescriptions Disp Refills   potassium chloride (MICRO-K) 10 MEQ CR capsule [Pharmacy Med Name: POTASSIUM CHLORIDE ER 10 MEQ CAP] 45 capsule 1    Sig: TAKE 1 CAPSULE BY MOUTH EVERY OTHER DAY     Endocrinology:  Minerals - Potassium Supplementation Passed - 07/30/2022 10:01 AM      Passed - K in normal range and within 360 days    Potassium  Date Value Ref Range Status  06/28/2022 4.9 3.5 - 5.3 mmol/L Final  10/16/2013 3.8 3.5 - 5.1 mmol/L Final         Passed - Cr in normal range and within 360 days    Creat  Date Value Ref Range Status  06/28/2022 1.18 0.70 - 1.22 mg/dL Final         Passed - Valid encounter within last 12 months    Recent Outpatient Visits           1 month ago Chronic pain syndrome   [place] [facility] Smitty Cords, DO   8 months ago Tinea pedis of right foot   [place] [facility] Smitty Cords, DO   11 months ago Benign hypertension with CKD (chronic kidney disease) stage III [facility])   [place] [facility] [place], Netta Neat, DO   1 year ago Chronic right-sided low back pain with bilateral sciatica   [place] [facility] Smitty Cords, DO   1 year ago Acute intractable headache, unspecified headache type   [place] [facility], Salvadore Oxford, NP       Future Appointments             In 2 weeks Stoioff, Verna Czech, MD [facility] [place]   In 5 months Al[place] Charon, Netta Neat, DO [place] [facility]

## 2022-08-13 ENCOUNTER — Encounter: Payer: Self-pay | Admitting: Neurosurgery

## 2022-08-13 ENCOUNTER — Ambulatory Visit (INDEPENDENT_AMBULATORY_CARE_PROVIDER_SITE_OTHER): Payer: 59 | Admitting: Neurosurgery

## 2022-08-13 VITALS — BP 92/55 | HR 79 | Temp 97.8°F | Wt 208.6 lb

## 2022-08-13 DIAGNOSIS — M48062 Spinal stenosis, lumbar region with neurogenic claudication: Secondary | ICD-10-CM

## 2022-08-13 NOTE — Progress Notes (Signed)
Referring Physician:  Smitty Cords, DO [address] [place],  [place] [address]  Primary Physician:  Smitty Cords, DO  History of Present Illness: 08/13/2022 He is doing better with physical therapy.  06/11/2022 Mr. Wiederholt is doing somewhat better.  He has been doing physical therapy.  He reports approximately 20% improvement in his symptoms.  He is happy without improvement and would like to hold off on further intervention at this time.   04/09/2022 Mr. Mats Heaster is here today with a chief complaint of back and leg pain.  This has been ongoing for 2 years but worsening more recently.  His pain is as bad as 10 out of 10 and made worse by walking and standing.  He did have an injection recently which has helped.  He has tried medication management which has helped a small amount.   Bowel/Bladder Dysfunction: none  Conservative measures:  Physical therapy:  has not participated  Multimodal medical therapy including regular antiinflammatories:  gabapentin, meloxicam, oxycodone  Injections:  has received epidural steroid injections by Dr. Laban Emperor  03/12/22: Right L4-5 IL ESI 10/18/21: Right L2, L3, L4, L5, S1 Radiofrequency Ablation 09/13/21: Right L2, L3, L4, L5, & S1 Medial Branch Block 08/09/21: Bilateral L2, L3, L4, L5, & S1 Medial Branch Block and a Bilateral SI joint injection  Past Surgery: denies  AASIR DELACERDA has no symptoms of cervical myelopathy.  The symptoms are causing a significant impact on the patient's life.   I have utilized the care everywhere function in epic to review the outside records available from external health systems.  Review of Systems:  A 10 point review of systems is negative, except for the pertinent positives and negatives detailed in the HPI.  Past Medical History: Past Medical History:  Diagnosis Date   Abdominal aortic aneurysm (AAA) (HCC) 2013   found at [facility]-   Anxiety    Arrhythmia    Arthritis    Colon polyp     COPD (chronic obstructive pulmonary disease) (HCC)    Coronary artery disease    Depression    GERD (gastroesophageal reflux disease)    Hyperlipidemia    Hypertension    Skin cancer    Thrush     Past Surgical History: Past Surgical History:  Procedure Laterality Date   CATARACT EXTRACTION W/PHACO Left 05/09/2020   Procedure: CATARACT EXTRACTION PHACO AND INTRAOCULAR LENS PLACEMENT (IOC) LEFT DIABETIC 9.09 01:02.0;  Surgeon: Galen Manila, MD;  Location: [facility];  Service: Ophthalmology;  Laterality: Left;   CATARACT EXTRACTION W/PHACO Right 05/23/2020   Procedure: CATARACT EXTRACTION PHACO AND INTRAOCULAR LENS PLACEMENT (IOC) RIGHT DIABETIC 8.13 01:00.3;  Surgeon: Galen Manila, MD;  Location: [facility];  Service: Ophthalmology;  Laterality: Right;   Colon polyp surgery     SKIN CANCER EXCISION     TRANSURETHRAL RESECTION OF PROSTATE  2010    Allergies: Allergies as of 08/13/2022 - Review Complete 08/13/2022  Allergen Reaction Noted   Nexium [esomeprazole magnesium]  12/22/2020    Medications: Current Meds  Medication Sig   albuterol (VENTOLIN HFA) 108 (90 Base) MCG/ACT inhaler Inhale 2 puffs into the lungs every 6 (six) hours as needed for wheezing or shortness of breath.   amLODipine (NORVASC) 5 MG tablet Take 1 tablet (5 mg total) by mouth daily.   aspirin EC 81 MG tablet Take 162 mg by mouth every evening.   cyanocobalamin (VITAMIN B12) 1000 MCG tablet Take 1,000 mcg by mouth daily.  FLUoxetine (PROZAC) 20 MG capsule TAKE ONE CAPSULE BY MOUTH TWICE A DAY   fluticasone (FLONASE) 50 MCG/ACT nasal spray PLACE 2 SPRAYS INTO BOTH NOSTRILS DAILY   furosemide (LASIX) 20 MG tablet Take 1 tablet (20 mg total) by mouth every other day.   LORazepam (ATIVAN) 0.5 MG tablet Take 1 tablet (0.5 mg total) by mouth at bedtime.   losartan (COZAAR) 25 MG tablet Take 1 tablet (25 mg total) by mouth daily.   meloxicam (MOBIC) 15 MG tablet TAKE 1 TABLET BY MOUTH  DAILY AS NEEDED FOR PAIN   oxyCODONE (OXY IR/ROXICODONE) 5 MG immediate release tablet Take 1 tablet (5 mg total) by mouth 2 (two) times daily as needed for severe pain. Each refill must last 30 days.   oxyCODONE (OXY IR/ROXICODONE) 5 MG immediate release tablet Take 1 tablet (5 mg total) by mouth 2 (two) times daily as needed for severe pain. Each refill must last 30 days.   oxyCODONE (OXY IR/ROXICODONE) 5 MG immediate release tablet Take 1 tablet (5 mg total) by mouth 2 (two) times daily as needed for severe pain. Each refill must last 30 days.   oxyCODONE (OXY IR/ROXICODONE) 5 MG immediate release tablet Take 1 tablet (5 mg total) by mouth 2 (two) times daily as needed for severe pain. Each refill must last 30 days.   pantoprazole (PROTONIX) 20 MG tablet Take 1 tablet by mouth daily.   potassium chloride (MICRO-K) 10 MEQ CR capsule TAKE 1 CAPSULE BY MOUTH EVERY OTHER DAY   simvastatin (ZOCOR) 20 MG tablet Take 1 tablet (20 mg total) by mouth at bedtime.   SPIRIVA RESPIMAT 2.5 MCG/ACT AERS INHALE 2 PUFFS BY MOUTH INTO THE LUNGS DAILY   tamsulosin (FLOMAX) 0.4 MG CAPS capsule TAKE 1 CAPSULE BY MOUTH DAILY AFTER BREAKFAST    Social History: Social History   Tobacco Use   Smoking status: Every Day    Packs/day: 1.00    Years: 70.00    Additional pack years: 0.00    Total pack years: 70.00    Types: Cigarettes   Smokeless tobacco: Never  Vaping Use   Vaping Use: Never used  Substance Use Topics   Alcohol use: No   Drug use: No    Family Medical History: Family History  Problem Relation Age of Onset   Kidney cancer Mother    Prostate cancer Neg Hx    Bladder Cancer Neg Hx     Physical Examination: Vitals:   08/13/22 1048  BP: (!) 92/55  Pulse: 79  Temp: 97.8 F (36.6 C)    General: Patient is well developed, well nourished, calm, collected, and in no apparent distress. Attention to examination is appropriate.  Neck:   Supple.  Full range of  motion.  Respiratory: Patient is breathing without any difficulty.   NEUROLOGICAL:     Awake, alert, oriented to person, place, and time.  Speech is clear and fluent.   Cranial Nerves: Pupils equal round and reactive to light.  Facial tone is symmetric.  Facial sensation is symmetric. Shoulder shrug is symmetric. Tongue protrusion is midline.  There is no pronator drift.  ROM of spine: full.    Strength: Side Biceps Triceps Deltoid Interossei Grip Wrist Ext. Wrist Flex.  R 5 5 5 5 5 5 5   L 5 5 5 5 5 5 5    Side Iliopsoas Quads Hamstring PF DF EHL  R 5 5 5 5 5 5   L 5 5 5 5 5  5  Reflexes are 1+ and symmetric at the biceps, triceps, brachioradialis, patella and achilles.   Hoffman's is absent.   Bilateral upper and lower extremity sensation is intact to light touch.    No evidence of dysmetria noted.  Gait is abnormal-he walks with a cane.     Medical Decision Making  Imaging: MRI L spine 03/08/2022 IMPRESSION: Lumbar spine degeneration which is focally severe at L4-5 where there is degenerative cyst and superior migrating disc extrusion. Advanced spinal stenosis and right L4 impingement at this level.     Electronically Signed   By: Tiburcio Pea M.D.   On: 03/11/2022 06:38  I have personally reviewed the images and agree with the above interpretation.  Assessment and Plan: Mr. Hillers is a pleasant 82 y.o. 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    He is doing better with physical therapy.  He will continue working on this.  At this point, he is not ready to consider surgical intervention.  Will see him back on an as-needed basis.  If his symptoms worsen, I will be happy to reevaluate for possible surgical decompression.  I recommended that he discontinue smoking.  I spent a total of 10 minutes in this patient's care today. This time was spent reviewing pertinent records including imaging studies,  obtaining and confirming history, performing a directed evaluation, formulating and discussing my recommendations, and documenting the visit within the medical record.    Thank you for involving me in the care of this patient.      Finas Delone K. Myer Haff MD, [facility]surgery

## 2022-08-19 ENCOUNTER — Encounter: Payer: Self-pay | Admitting: Urology

## 2022-08-19 ENCOUNTER — Ambulatory Visit (INDEPENDENT_AMBULATORY_CARE_PROVIDER_SITE_OTHER): Payer: 59 | Admitting: Urology

## 2022-08-19 VITALS — BP 109/68 | HR 69 | Ht 71.0 in | Wt 205.0 lb

## 2022-08-19 DIAGNOSIS — R972 Elevated prostate specific antigen [PSA]: Secondary | ICD-10-CM

## 2022-08-19 DIAGNOSIS — N400 Enlarged prostate without lower urinary tract symptoms: Secondary | ICD-10-CM

## 2022-08-19 LAB — URINALYSIS, COMPLETE
Bilirubin, UA: NEGATIVE
Glucose, UA: NEGATIVE
Ketones, UA: NEGATIVE
Nitrite, UA: NEGATIVE
RBC, UA: NEGATIVE
Specific Gravity, UA: 1.02 (ref 1.005–1.030)
Urobilinogen, Ur: 1 mg/dL (ref 0.2–1.0)
pH, UA: 7 (ref 5.0–7.5)

## 2022-08-19 LAB — MICROSCOPIC EXAMINATION

## 2022-08-19 LAB — BLADDER SCAN AMB NON-IMAGING: Scan Result: 57

## 2022-08-19 NOTE — Progress Notes (Signed)
I, [name],acting as a scribe for Riki Altes, MD.,have documented all relevant documentation on the behalf of Riki Altes, MD,as directed by  Riki Altes, MD while in the presence of Riki Altes, MD.  08/19/2022 12:56 PM   [name] 02/05/1941 [id number]  Referring provider: Smitty Cords, DO [address] [place],  [place] [address]  Chief Complaint  Patient presents with   Benign Prostatic Hypertrophy    HPI: Charles Ewing is a 82 y.o. male referred for evaluation of an elevated PSA.   Patient has a long history of PSA dating back to 2017. He has had several visits here since that time; last in 2019.  PSA has been as high as 8.19 February 2015.  3 negative prostate biopsies at the [place].  Status post TURP by myself in 2010.  At his last visit in 2019 with Dr. Richardo Hanks, he indicated he elected to discontinue prostate cancer screening based on his age. However, has continued to be checked. PSA in 2020 was 6.8 and 2022 was 8.29. Last PSA May 2024 was 11.48 Has noted increased urgency and frequency over the past few weeks.  IPSS 17/35  PSA trend   PSA  Latest Ref Rng < OR = 4.00 ng/mL  01/11/2019 6.8 (H)   10/30/2020 8.29 (H)   06/28/2022 11.48 (H)      PMH: Past Medical History:  Diagnosis Date   Abdominal aortic aneurysm (AAA) (HCC) 2013   found at [facility]-   Anxiety    Arrhythmia    Arthritis    Colon polyp    COPD (chronic obstructive pulmonary disease) (HCC)    Coronary artery disease    Depression    GERD (gastroesophageal reflux disease)    Hyperlipidemia    Hypertension    Skin cancer    Thrush     Surgical History: Past Surgical History:  Procedure Laterality Date   CATARACT EXTRACTION W/PHACO Left 05/09/2020   Procedure: CATARACT EXTRACTION PHACO AND INTRAOCULAR LENS PLACEMENT (IOC) LEFT DIABETIC 9.09 01:02.0;  Surgeon: Galen Manila, MD;  Location: [facility];  Service: Ophthalmology;  Laterality: Left;    CATARACT EXTRACTION W/PHACO Right 05/23/2020   Procedure: CATARACT EXTRACTION PHACO AND INTRAOCULAR LENS PLACEMENT (IOC) RIGHT DIABETIC 8.13 01:00.3;  Surgeon: Galen Manila, MD;  Location: [facility];  Service: Ophthalmology;  Laterality: Right;   Colon polyp surgery     SKIN CANCER EXCISION     TRANSURETHRAL RESECTION OF PROSTATE  2010    Home Medications:  Allergies as of 08/19/2022       Reactions   Nexium [esomeprazole Magnesium]         Medication List        Accurate as of August 19, 2022 12:56 PM. If you have any questions, ask your nurse or doctor.          albuterol 108 (90 Base) MCG/ACT inhaler Commonly known as: VENTOLIN HFA Inhale 2 puffs into the lungs every 6 (six) hours as needed for wheezing or shortness of breath.   amLODipine 5 MG tablet Commonly known as: NORVASC Take 1 tablet (5 mg total) by mouth daily.   aspirin EC 81 MG tablet Take 162 mg by mouth every evening.   cyanocobalamin 1000 MCG tablet Commonly known as: VITAMIN B12 Take 1,000 mcg by mouth daily.   FLUoxetine 20 MG capsule Commonly known as: PROZAC TAKE ONE CAPSULE BY MOUTH TWICE A DAY   fluticasone 50  MCG/ACT nasal spray Commonly known as: FLONASE PLACE 2 SPRAYS INTO BOTH NOSTRILS DAILY   furosemide 20 MG tablet Commonly known as: LASIX Take 1 tablet (20 mg total) by mouth every other day.   LORazepam 0.5 MG tablet Commonly known as: ATIVAN Take 1 tablet (0.5 mg total) by mouth at bedtime.   losartan 25 MG tablet Commonly known as: COZAAR Take 1 tablet (25 mg total) by mouth daily.   meloxicam 15 MG tablet Commonly known as: MOBIC TAKE 1 TABLET BY MOUTH DAILY AS NEEDED FOR PAIN   oxyCODONE 5 MG immediate release tablet Commonly known as: Oxy IR/ROXICODONE Take 1 tablet (5 mg total) by mouth 2 (two) times daily as needed for severe pain. Each refill must last 30 days.   oxyCODONE 5 MG immediate release tablet Commonly known as: Oxy IR/ROXICODONE Take 1  tablet (5 mg total) by mouth 2 (two) times daily as needed for severe pain. Each refill must last 30 days.   oxyCODONE 5 MG immediate release tablet Commonly known as: Oxy IR/ROXICODONE Take 1 tablet (5 mg total) by mouth 2 (two) times daily as needed for severe pain. Each refill must last 30 days.   oxyCODONE 5 MG immediate release tablet Commonly known as: Oxy IR/ROXICODONE Take 1 tablet (5 mg total) by mouth 2 (two) times daily as needed for severe pain. Each refill must last 30 days.   pantoprazole 20 MG tablet Commonly known as: PROTONIX Take 1 tablet by mouth daily.   potassium chloride 10 MEQ CR capsule Commonly known as: MICRO-K TAKE 1 CAPSULE BY MOUTH EVERY OTHER DAY   simvastatin 20 MG tablet Commonly known as: ZOCOR Take 1 tablet (20 mg total) by mouth at bedtime.   Spiriva Respimat 2.5 MCG/ACT Aers Generic drug: Tiotropium Bromide Monohydrate INHALE 2 PUFFS BY MOUTH INTO THE LUNGS DAILY   tamsulosin 0.4 MG Caps capsule Commonly known as: FLOMAX TAKE 1 CAPSULE BY MOUTH DAILY AFTER BREAKFAST        Allergies:  Allergies  Allergen Reactions   Nexium [Esomeprazole Magnesium]     Family History: Family History  Problem Relation Age of Onset   Kidney cancer Mother    Prostate cancer Neg Hx    Bladder Cancer Neg Hx     Social History:  reports that he has been smoking cigarettes. He has a 70.00 pack-year smoking history. He has never used smokeless tobacco. He reports that he does not drink alcohol and does not use drugs.   Physical Exam: BP 109/68   Pulse 69   Ht 5\' 11"  (1.803 m)   Wt 205 lb (93 kg)   BMI 28.59 kg/m   Constitutional:  Alert and oriented, No acute distress. HEENT: [place] AT Respiratory: Normal respiratory effort, no increased work of breathing. GU: Prostate 35 grams, smooth without nodules.  Psychiatric: Normal mood and affect.   Urinalysis Trace blood/2+ leukocytes on dipstick. Micro 11-30 WBC.    Assessment & Plan:    1.  Elevated PSA  Benign DRE  Most recent PSA is elevated above baseline however he does have significant pyuria which is the most likely cause of his increased PSA  Urine culture ordered and will await results prior to antibiotic therapy. Repeat PSA 1 month. If PSA returns to baseline, would recommend discontinuing prostate cancer screening based on age, prostate cancer screening guidelines  I have reviewed the above documentation for accuracy and completeness, and I agree with the above.   Riki Altes, MD  [facility] [address], [address] [place], [place] [address] [phone]

## 2022-08-21 ENCOUNTER — Ambulatory Visit: Payer: 59 | Attending: Pain Medicine | Admitting: Pain Medicine

## 2022-08-21 VITALS — BP 110/67 | HR 86 | Temp 97.5°F | Resp 16 | Ht 71.5 in | Wt 205.0 lb

## 2022-08-21 DIAGNOSIS — M160 Bilateral primary osteoarthritis of hip: Secondary | ICD-10-CM | POA: Insufficient documentation

## 2022-08-21 DIAGNOSIS — M25551 Pain in right hip: Secondary | ICD-10-CM | POA: Diagnosis not present

## 2022-08-21 DIAGNOSIS — Z79899 Other long term (current) drug therapy: Secondary | ICD-10-CM | POA: Diagnosis not present

## 2022-08-21 DIAGNOSIS — Z79891 Long term (current) use of opiate analgesic: Secondary | ICD-10-CM | POA: Diagnosis not present

## 2022-08-21 DIAGNOSIS — J449 Chronic obstructive pulmonary disease, unspecified: Secondary | ICD-10-CM | POA: Diagnosis not present

## 2022-08-21 DIAGNOSIS — M5137 Other intervertebral disc degeneration, lumbosacral region: Secondary | ICD-10-CM | POA: Insufficient documentation

## 2022-08-21 DIAGNOSIS — M47816 Spondylosis without myelopathy or radiculopathy, lumbar region: Secondary | ICD-10-CM | POA: Diagnosis not present

## 2022-08-21 DIAGNOSIS — M4186 Other forms of scoliosis, lumbar region: Secondary | ICD-10-CM | POA: Diagnosis not present

## 2022-08-21 DIAGNOSIS — M79604 Pain in right leg: Secondary | ICD-10-CM | POA: Diagnosis not present

## 2022-08-21 DIAGNOSIS — I10 Essential (primary) hypertension: Secondary | ICD-10-CM | POA: Diagnosis not present

## 2022-08-21 DIAGNOSIS — G894 Chronic pain syndrome: Secondary | ICD-10-CM | POA: Diagnosis not present

## 2022-08-21 DIAGNOSIS — M545 Low back pain, unspecified: Secondary | ICD-10-CM | POA: Insufficient documentation

## 2022-08-21 DIAGNOSIS — I2510 Atherosclerotic heart disease of native coronary artery without angina pectoris: Secondary | ICD-10-CM | POA: Diagnosis not present

## 2022-08-21 DIAGNOSIS — M461 Sacroiliitis, not elsewhere classified: Secondary | ICD-10-CM | POA: Insufficient documentation

## 2022-08-21 DIAGNOSIS — I714 Abdominal aortic aneurysm, without rupture, unspecified: Secondary | ICD-10-CM | POA: Diagnosis not present

## 2022-08-21 DIAGNOSIS — E785 Hyperlipidemia, unspecified: Secondary | ICD-10-CM | POA: Diagnosis not present

## 2022-08-21 DIAGNOSIS — G8929 Other chronic pain: Secondary | ICD-10-CM | POA: Insufficient documentation

## 2022-08-21 LAB — CULTURE, URINE COMPREHENSIVE

## 2022-08-21 MED ORDER — OXYCODONE HCL 5 MG PO TABS
5.0000 mg | ORAL_TABLET | Freq: Two times a day (BID) | ORAL | 0 refills | Status: DC | PRN
Start: 2022-09-27 — End: 2022-11-24

## 2022-08-21 MED ORDER — NALOXONE HCL 4 MG/0.1ML NA LIQD
1.0000 | NASAL | 0 refills | Status: DC | PRN
Start: 2022-08-21 — End: 2023-03-26

## 2022-08-21 MED ORDER — OXYCODONE HCL 5 MG PO TABS
5.0000 mg | ORAL_TABLET | Freq: Two times a day (BID) | ORAL | 0 refills | Status: DC | PRN
Start: 2022-08-28 — End: 2022-11-24

## 2022-08-21 MED ORDER — OXYCODONE HCL 5 MG PO TABS
5.0000 mg | ORAL_TABLET | Freq: Two times a day (BID) | ORAL | 0 refills | Status: DC | PRN
Start: 2022-10-27 — End: 2022-11-24

## 2022-08-21 NOTE — Progress Notes (Signed)
Nursing Pain Medication Assessment:  Safety precautions to be maintained throughout the outpatient stay will include: orient to surroundings, keep bed in low position, maintain call bell within reach at all times, provide assistance with transfer out of bed and ambulation.  Medication Inspection Compliance: Pill count conducted under aseptic conditions, in front of the patient. Neither the pills nor the bottle was removed from the patient's sight at any time. Once count was completed pills were immediately returned to the patient in their original bottle.  Medication: Oxycodone IR Pill/Patch Count:  19 of 60 pills remain Pill/Patch Appearance: Markings consistent with prescribed medication Bottle Appearance: Standard pharmacy container. Clearly labeled. Filled Date: 6 / 17 / 2023 Last Medication intake:  Today

## 2022-08-21 NOTE — Progress Notes (Signed)
PROVIDER NOTE: Information contained herein reflects review and annotations entered in association with encounter. Interpretation of such information and data should be left to medically-trained personnel. Information provided to patient can be located elsewhere in the medical record under "Patient Instructions". Document created using STT-dictation technology, any transcriptional errors that may result from process are unintentional.    Patient: [name]  Service Category: E/M  Provider: Oswaldo Done, MD  DOB: 08-08-1940  DOS: 08/21/2022  Referring Provider: Saralyn Pilar *  MRN: [id number]  Specialty: Interventional Pain Management  PCP: Smitty Cords, DO  Type: Established Patient  Setting: Ambulatory outpatient    Location: Office  Delivery: Face-to-face     HPI  Mr. TERMAINE ROUPP, a 82 y.o. year old male, is here today because of his Chronic pain syndrome [G89.4]. Mr. Spark primary complain today is Back Pain (lower)  Pertinent problems: Mr. Cybulski has Osteoarthritis of knees (Bilateral); Bilateral lower extremity edema; Chronic pain syndrome; Chronic hip pain (2ry area of Pain) (Right); Chronic lower extremity pain (3ry area of Pain) (Right); Chronic low back pain (1ry area of Pain) (Bilateral) (R>L) w/o sciatica; Lumbar facet syndrome; Dextroscoliosis of lumbar spine; DDD (degenerative disc disease), lumbosacral; Osteopenia of lumbar spine; Osteopenia determined by x-ray; Osteoarthritis of sacroiliac joints (HCC) (Bilateral); Osteoarthritis of hips (Bilateral); Chronic sacroiliac joint pain (Bilateral); Sacroiliac joint dysfunction (Bilateral); Spondylosis without myelopathy or radiculopathy, lumbosacral region; Other spondylosis, sacral and sacrococcygeal region; Abnormal MRI, lumbar spine (03/11/2022); Chronic low back pain (Bilateral) w/ sciatica (Right); Herniated nucleus pulposus, L4-5 (Right); Lumbosacral radiculopathy at L4 (Right); L4-5 disc bulge (Right);  Spondylolisthesis at L4-L5 level; 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 Pain Assessment: Severity of Chronic pain is reported as a 6 /10. Location: Back Lower/down to right hip, down back of leg to above knee. Onset: More than a month ago. Quality: Aching, Constant, Pressure, Radiating, Discomfort. Timing: Constant. Modifying factor(s): movment, meds. Vitals:  height is 5' 11.5" (1.816 m) and weight is 205 lb (93 kg). His temperature is 97.5 F (36.4 C) (abnormal). His blood pressure is 110/67 and his pulse is 86. His respiration is 16 and oxygen saturation is 99%.  BMI: Estimated body mass index is 28.19 kg/m as calculated from the following:   Height as of this encounter: 5' 11.5" (1.816 m).   Weight as of this encounter: 205 lb (93 kg). Last encounter: 05/27/2022. Last procedure: 03/12/2022.  Reason for encounter: medication management.  The patient indicates doing well with the current medication regimen. No adverse reactions or side effects reported to the medications.  The patient indicates that he has done much better since he came to terms with the fact that he will be having chronic pain.  The medications continue to help him with this pain and he knows that anytime that he experiences a flareup he needs to give [place] a call so that we can use our interventional therapies to get it back under control.  Routine UDS ordered today.   RTCB: 11/26/2022   Pharmacotherapy Assessment  Analgesic: Oxycodone IR 5 mg tablet, 1 tab p.o. 2 times daily (#60) MME/day: 15 mg/day   Monitoring: [place] PMP: PDMP reviewed during this encounter.       Pharmacotherapy: No side-effects or adverse reactions reported. Compliance: No problems identified. Effectiveness: Clinically acceptable.  Newman Pies, RN  08/21/2022 12:50 PM  Sign when Signing Visit Nursing Pain Medication Assessment:  Safety  precautions to  be maintained throughout the outpatient stay will include: orient to surroundings, keep bed in low position, maintain call bell within reach at all times, provide assistance with transfer out of bed and ambulation.  Medication Inspection Compliance: Pill count conducted under aseptic conditions, in front of the patient. Neither the pills nor the bottle was removed from the patient's sight at any time. Once count was completed pills were immediately returned to the patient in their original bottle.  Medication: Oxycodone IR Pill/Patch Count:  19 of 60 pills remain Pill/Patch Appearance: Markings consistent with prescribed medication Bottle Appearance: Standard pharmacy container. Clearly labeled. Filled Date: 6 / 17 / 2023 Last Medication intake:  Today    No results found for: "CBDTHCR" No results found for: "D8THCCBX" No results found for: "D9THCCBX"  UDS:  Summary  Date Value Ref Range Status  06/14/2021 Note  Final    Comment:    ==================================================================== Compliance Drug Analysis, Ur ==================================================================== Test                             Result       Flag       Units  Drug Present and Declared for Prescription Verification   Lorazepam                      200          EXPECTED   ng/mg creat    Source of lorazepam is a scheduled prescription medication.    Oxycodone                      1671         EXPECTED   ng/mg creat   Noroxycodone                   2522         EXPECTED   ng/mg creat    Sources of oxycodone include scheduled prescription medications.    Noroxycodone is an expected metabolite of oxycodone.    Gabapentin                     PRESENT      EXPECTED   Fluoxetine                     PRESENT      EXPECTED   Norfluoxetine                  PRESENT      EXPECTED    Norfluoxetine is an expected metabolite of fluoxetine.  Drug Absent but Declared for Prescription Verification    Salicylate                     Not Detected UNEXPECTED    Aspirin, as indicated in the declared medication list, is not always    detected even when used as directed.    Metoprolol                     Not Detected UNEXPECTED ==================================================================== Test                      Result    Flag   Units      Ref Range   Creatinine              41  mg/dL      >=16 ==================================================================== Declared Medications:  The flagging and interpretation on this report are based on the  following declared medications.  Unexpected results may arise from  inaccuracies in the declared medications.   **Note: The testing scope of this panel includes these medications:   Fluoxetine (Prozac)  Gabapentin (Neurontin)  Lorazepam (Ativan)  Metoprolol (Toprol)  Oxycodone (Roxicodone)   **Note: The testing scope of this panel does not include small to  moderate amounts of these reported medications:   Aspirin   **Note: The testing scope of this panel does not include the  following reported medications:   Albuterol (Ventolin HFA)  Amlodipine (Norvasc)  Fluticasone (Flonase)  Furosemide (Lasix)  Losartan (Cozaar)  Meloxicam (Mobic)  Mupirocin (Bactroban)  Nystatin  Pantoprazole (Protonix)  Potassium Chloride  Simvastatin (Zocor)  Sucralfate (Carafate)  Tamsulosin (Flomax) ==================================================================== For clinical consultation, please call [phone]. ====================================================================       ROS  Constitutional: Denies any fever or chills Gastrointestinal: No reported hemesis, hematochezia, vomiting, or acute GI distress Musculoskeletal: Denies any acute onset joint swelling, redness, loss of ROM, or weakness Neurological: No reported episodes of acute onset apraxia, aphasia, dysarthria, agnosia, amnesia, paralysis, loss  of coordination, or loss of consciousness  Medication Review  FLUoxetine, LORazepam, Tiotropium Bromide Monohydrate, albuterol, amLODipine, aspirin EC, cyanocobalamin, fluticasone, furosemide, losartan, meloxicam, naloxone, oxyCODONE, pantoprazole, potassium chloride, simvastatin, and tamsulosin  History Review  Allergy: Mr. Corpus is allergic to nexium [esomeprazole magnesium]. Drug: Mr. Mcchristian  reports no history of drug use. Alcohol:  reports no history of alcohol use. Tobacco:  reports that he has been smoking cigarettes. He has a 70.00 pack-year smoking history. He has never used smokeless tobacco. Social: Mr. Nydam  reports that he has been smoking cigarettes. He has a 70.00 pack-year smoking history. He has never used smokeless tobacco. He reports that he does not drink alcohol and does not use drugs. Medical:  has a past medical history of Abdominal aortic aneurysm (AAA) (HCC) (2013), Anxiety, Arrhythmia, Arthritis, Colon polyp, COPD (chronic obstructive pulmonary disease) (HCC), Coronary artery disease, Depression, GERD (gastroesophageal reflux disease), Hyperlipidemia, Hypertension, Skin cancer, and Thrush. Surgical: Mr. Altland  has a past surgical history that includes Skin cancer excision; Colon polyp surgery; Transurethral resection of prostate (2010); Cataract extraction w/PHACO (Left, 05/09/2020); and Cataract extraction w/PHACO (Right, 05/23/2020). Family: family history includes Kidney cancer in his mother.  Laboratory Chemistry Profile   Renal Lab Results  Component Value Date   BUN 18 06/28/2022   CREATININE 1.18 06/28/2022   BCR SEE NOTE: 06/28/2022   GFRAA 64 01/11/2019   GFRNONAA >60 12/23/2020    Hepatic Lab Results  Component Value Date   AST 13 06/28/2022   ALT 8 (L) 06/28/2022   ALBUMIN 3.9 06/13/2021   ALKPHOS 70 06/13/2021    Electrolytes Lab Results  Component Value Date   NA 138 06/28/2022   K 4.9 06/28/2022   CL 104 06/28/2022   CALCIUM 8.8 06/28/2022    MG 2.3 06/13/2021    Bone Lab Results  Component Value Date   VD25OH 39 06/28/2022   25OHVITD1 14 (L) 06/13/2021   25OHVITD2 1.8 06/13/2021   25OHVITD3 12 06/13/2021    Inflammation (CRP: Acute Phase) (ESR: Chronic Phase) Lab Results  Component Value Date   CRP 1 06/13/2021   ESRSEDRATE 20 06/13/2021         Note: Above Lab results reviewed.  Recent Imaging Review  DG PAIN CLINIC C-ARM 1-60 MIN NO REPORT  Fluoro was used, but no Radiologist interpretation will be provided.  Please refer to "NOTES" tab for provider progress note. Note: Reviewed        Physical Exam  General appearance: Well nourished, well developed, and well hydrated. In no apparent acute distress Mental status: Alert, oriented x 3 (person, place, & time)       Respiratory: No evidence of acute respiratory distress Eyes: PERLA Vitals: BP 110/67   Pulse 86   Temp (!) 97.5 F (36.4 C)   Resp 16   Ht 5' 11.5" (1.816 m)   Wt 205 lb (93 kg)   SpO2 99%   BMI 28.19 kg/m  BMI: Estimated body mass index is 28.19 kg/m as calculated from the following:   Height as of this encounter: 5' 11.5" (1.816 m).   Weight as of this encounter: 205 lb (93 kg). Ideal: Ideal body weight: 76.5 kg (168 lb 8.7 oz) Adjusted ideal body weight: 83.1 kg (183 lb 2 oz)  Assessment   Diagnosis Status  1. Chronic pain syndrome   2. Chronic low back pain (1ry area of Pain) (Bilateral) (R>L) w/o sciatica   3. Chronic hip pain (2ry area of Pain) (Right)   4. Chronic lower extremity pain (3ry area of Pain) (Right)   5. DDD (degenerative disc disease), lumbosacral   6. Osteoarthritis of hips (Bilateral)   7. Lumbar facet syndrome (Right)   8. Osteoarthritis of sacroiliac joints (HCC) (Bilateral)   9. Dextroscoliosis of lumbar spine   10. Pharmacologic therapy   11. Chronic use of opiate for therapeutic purpose   12. Encounter for medication management   13. Encounter for chronic pain management     Controlled Controlled Controlled   Updated Problems: No problems updated.  Plan of Care  Problem-specific:  No problem-specific Assessment & Plan notes found for this encounter.  Mr. NGUYEN TODOROV has a current medication list which includes the following long-term medication(s): albuterol, amlodipine, fluoxetine, fluticasone, furosemide, losartan, potassium chloride, simvastatin, spiriva respimat, [START ON 08/28/2022] oxycodone, [START ON 09/27/2022] oxycodone, and [START ON 10/27/2022] oxycodone.  Pharmacotherapy (Medications Ordered): Meds ordered this encounter  Medications   oxyCODONE (OXY IR/ROXICODONE) 5 MG immediate release tablet    Sig: Take 1 tablet (5 mg total) by mouth 2 (two) times daily as needed for severe pain. Each refill must last 30 days.    Dispense:  60 tablet    Refill:  0    DO NOT: delete (not duplicate); no partial-fill (will deny script to complete), no refill request (F/U required). DISPENSE: 1 day early if closed on fill date. WARN: No CNS-depressants within 8 hrs of med.   oxyCODONE (OXY IR/ROXICODONE) 5 MG immediate release tablet    Sig: Take 1 tablet (5 mg total) by mouth 2 (two) times daily as needed for severe pain. Each refill must last 30 days.    Dispense:  60 tablet    Refill:  0    DO NOT: delete (not duplicate); no partial-fill (will deny script to complete), no refill request (F/U required). DISPENSE: 1 day early if closed on fill date. WARN: No CNS-depressants within 8 hrs of med.   oxyCODONE (OXY IR/ROXICODONE) 5 MG immediate release tablet    Sig: Take 1 tablet (5 mg total) by mouth 2 (two) times daily as needed for severe pain. Each refill must last 30 days.    Dispense:  60 tablet    Refill:  0    DO NOT: delete (not duplicate); no  partial-fill (will deny script to complete), no refill request (F/U required). DISPENSE: 1 day early if closed on fill date. WARN: No CNS-depressants within 8 hrs of med.   naloxone (NARCAN) nasal spray 4 mg/0.1  mL    Sig: Place 1 spray into the nose as needed for up to 365 doses (for opioid-induced respiratory depresssion). In case of emergency (overdose), spray once into each nostril. If no response within 3 minutes, repeat application and call 911.    Dispense:  1 each    Refill:  0    Instruct patient in proper use of device.   Orders:  Orders Placed This Encounter  Procedures   ToxASSURE Select 13 (MW), Urine    Volume: 30 ml(s). Minimum 3 ml of urine is needed. Document temperature of fresh sample. Indications: Long term (current) use of opiate analgesic (G64.403)    Order Specific Question:   Release to patient    Answer:   Immediate   Nursing Instructions:    1). STAT: UDS required today. 2). Make sure to document all opioids and benzodiazepines taken, including time of last intake. 3). If order is entered on a procedure day, make sure sample is obtained before any medications are administered.   Follow-up plan:   Return in about 3 months (around 11/26/2022) for Eval-day (M,W), (F2F), (MM).      Interventional Therapies  Risk Factors  Considerations:   Abdominal aortic aneurysm (AAA)  HTN  Cardiomyopathy  CAD  orthostatic hypotension  Irregular heart rhythm CKD  SOB  Tobacco abuse  COPD  GERD   Planned  Pending:      Under consideration:   Diagnostic/therapeutic right L4-5 LESI #2  Diagnostic/therapeutic bilateral IA hip joint inj. #1    Completed:   Diagnostic/therapeutic right L4-5 LESI x1 (03/12/2022)  Therapeutic right lumbar facet RFA x1 (10/18/2021) (100/100/50/50)  Diagnostic right lumbar facet MBB x2 (09/13/2021) (100/100/50/50)    Completed by other providers:   Diagnostic/therapeutic bilateral IA steroid knee inj. (03/15/2016) by Altamese Cabal, PA Raechel Chute)    Therapeutic  Palliative (PRN) options:   Therapeutic right lumbar facet MBB       Recent Visits Date Type Provider Dept  05/27/22 Office Visit Delano Metz, MD [facility]   Showing recent visits within past 90 days and meeting all other requirements Today's Visits Date Type Provider Dept  08/21/22 Office Visit Delano Metz, MD [facility]  Showing today's visits and meeting all other requirements Future Appointments No visits were found meeting these conditions. Showing future appointments within next 90 days and meeting all other requirements  I discussed the assessment and treatment plan with the patient. The patient was provided an opportunity to ask questions and all were answered. The patient agreed with the plan and demonstrated an understanding of the instructions.  Patient advised to call back or seek an in-person evaluation if the symptoms or condition worsens.  Duration of encounter: 30 minutes.  Total time on encounter, as per AMA guidelines included both the face-to-face and non-face-to-face time personally spent by the physician and/or other qualified health care professional(s) on the day of the encounter (includes time in activities that require the physician or other qualified health care professional and does not include time in activities normally performed by clinical staff). Physician's time may include the following activities when performed: Preparing to see the patient (e.g., pre-charting review of records, searching for previously ordered imaging, lab work, and nerve conduction tests) Review of prior analgesic pharmacotherapies. Reviewing  PMP Interpreting ordered tests (e.g., lab work, imaging, nerve conduction tests) Performing post-procedure evaluations, including interpretation of diagnostic procedures Obtaining and/or reviewing separately obtained history Performing a medically appropriate examination and/or evaluation Counseling and educating the patient/family/caregiver Ordering medications, tests, or procedures Referring and communicating with other health care professionals (when not separately  reported) Documenting clinical information in the electronic or other health record Independently interpreting results (not separately reported) and communicating results to the patient/ family/caregiver Care coordination (not separately reported)  Note by: Oswaldo Done, MD Date: 08/21/2022; Time: 12:57 PM

## 2022-08-21 NOTE — Patient Instructions (Signed)
____________________________________________________________________________________________  Opioid Pain Medication Update  To: All patients taking opioid pain medications. (I.e.: hydrocodone, hydromorphone, oxycodone, oxymorphone, morphine, codeine, methadone, tapentadol, tramadol, buprenorphine, fentanyl, etc.)  Re: Updated review of side effects and adverse reactions of opioid analgesics, as well as new information about long term effects of this class of medications.  Direct risks of long-term opioid therapy are not limited to opioid addiction and overdose. Potential medical risks include serious fractures, breathing problems during sleep, hyperalgesia, immunosuppression, chronic constipation, bowel obstruction, myocardial infarction, and tooth decay secondary to xerostomia.  Unpredictable adverse effects that can occur even if you take your medication correctly: Cognitive impairment, respiratory depression, and death. Most people think that if they take their medication "correctly", and "as instructed", that they will be safe. Nothing could be farther from the truth. In reality, a significant amount of recorded deaths associated with the use of opioids has occurred in individuals that had taken the medication for a long time, and were taking their medication correctly. The following are examples of how this can happen: Patient taking his/her medication for a long time, as instructed, without any side effects, is given a certain antibiotic or another unrelated medication, which in turn triggers a "Drug-to-drug interaction" leading to disorientation, cognitive impairment, impaired reflexes, respiratory depression or an untoward event leading to serious bodily harm or injury, including death.  Patient taking his/her medication for a long time, as instructed, without any side effects, develops an acute impairment of liver and/or kidney function. This will lead to a rapid inability of the body to  breakdown and eliminate their pain medication, which will result in effects similar to an "overdose", but with the same medicine and dose that they had always taken. This again may lead to disorientation, cognitive impairment, impaired reflexes, respiratory depression or an untoward event leading to serious bodily harm or injury, including death.  A similar problem will occur with patients as they grow older and their liver and kidney function begins to decrease as part of the aging process.  Background information: Historically, the original case for using long-term opioid therapy to treat chronic noncancer pain was based on safety assumptions that subsequent experience has called into question. In 1996, the American Pain Society and the American Academy of Pain Medicine issued a consensus statement supporting long-term opioid therapy. This statement acknowledged the dangers of opioid prescribing but concluded that the risk for addiction was low; respiratory depression induced by opioids was short-lived, occurred mainly in opioid-naive patients, and was antagonized by pain; tolerance was not a common problem; and efforts to control diversion should not constrain opioid prescribing. This has now proven to be wrong. Experience regarding the risks for opioid addiction, misuse, and overdose in community practice has failed to support these assumptions.  According to the Centers for Disease Control and Prevention, fatal overdoses involving opioid analgesics have increased sharply over the past decade. Currently, more than 96,700 people die from drug overdoses every year. Opioids are a factor in 7 out of every 10 overdose deaths. Deaths from drug overdose have surpassed motor vehicle accidents as the leading cause of death for individuals between the ages of 35 and 54.  Clinical data suggest that neuroendocrine dysfunction may be very common in both men and women, potentially causing hypogonadism, erectile  dysfunction, infertility, decreased libido, osteoporosis, and depression. Recent studies linked higher opioid dose to increased opioid-related mortality. Controlled observational studies reported that long-term opioid therapy may be associated with increased risk for cardiovascular events. Subsequent meta-analysis concluded   that the safety of long-term opioid therapy in elderly patients has not been proven.   Side Effects and adverse reactions: Common side effects: Drowsiness (sedation). Dizziness. Nausea and vomiting. Constipation. Physical dependence -- Dependence often manifests with withdrawal symptoms when opioids are discontinued or decreased. Tolerance -- As you take repeated doses of opioids, you require increased medication to experience the same effect of pain relief. Respiratory depression -- This can occur in healthy people, especially with higher doses. However, people with COPD, asthma or other lung conditions may be even more susceptible to fatal respiratory impairment.  Uncommon side effects: An increased sensitivity to feeling pain and extreme response to pain (hyperalgesia). Chronic use of opioids can lead to this. Delayed gastric emptying (the process by which the contents of your stomach are moved into your small intestine). Muscle rigidity. Immune system and hormonal dysfunction. Quick, involuntary muscle jerks (myoclonus). Arrhythmia. Itchy skin (pruritus). Dry mouth (xerostomia).  Long-term side effects: Chronic constipation. Sleep-disordered breathing (SDB). Increased risk of bone fractures. Hypothalamic-pituitary-adrenal dysregulation. Increased risk of overdose.  RISKS: Respiratory depression and death: Opioids increase the risk of respiratory depression and death.  Drug-to-drug interactions: Opioids are relatively contraindicated in combination with benzodiazepines, sleep inducers, and other central nervous system depressants. Other classes of medications  (i.e.: certain antibiotics and even over-the-counter medications) may also trigger or induce respiratory depression in some patients.  Medical conditions: Patients with pre-existing respiratory problems are at higher risk of respiratory failure and/or depression when in combination with opioid analgesics. Opioids are relatively contraindicated in some medical conditions such as central sleep apnea.   Fractures and Falls:  Opioids increase the risk and incidence of falls. This is of particular importance in elderly patients.  Endocrine System:  Long-term administration is associated with endocrine abnormalities (endocrinopathies). (Also known as Opioid-induced Endocrinopathy) Influences on both the hypothalamic-pituitary-adrenal axis?and the hypothalamic-pituitary-gonadal axis have been demonstrated with consequent hypogonadism and adrenal insufficiency in both sexes. Hypogonadism and decreased levels of dehydroepiandrosterone sulfate have been reported in men and women. Endocrine effects include: Amenorrhoea in women (abnormal absence of menstruation) Reduced libido in both sexes Decreased sexual function Erectile dysfunction in men Hypogonadisms (decreased testicular function with shrinkage of testicles) Infertility Depression and fatigue Loss of muscle mass Anxiety Depression Immune suppression Hyperalgesia Weight gain Anemia Osteoporosis Patients (particularly women of childbearing age) should avoid opioids. There is insufficient evidence to recommend routine monitoring of asymptomatic patients taking opioids in the long-term for hormonal deficiencies.  Immune System: Human studies have demonstrated that opioids have an immunomodulating effect. These effects are mediated via opioid receptors both on immune effector cells and in the central nervous system. Opioids have been demonstrated to have adverse effects on antimicrobial response and anti-tumour surveillance. Buprenorphine has  been demonstrated to have no impact on immune function.  Opioid Induced Hyperalgesia: Human studies have demonstrated that prolonged use of opioids can lead to a state of abnormal pain sensitivity, sometimes called opioid induced hyperalgesia (OIH). Opioid induced hyperalgesia is not usually seen in the absence of tolerance to opioid analgesia. Clinically, hyperalgesia may be diagnosed if the patient on long-term opioid therapy presents with increased pain. This might be qualitatively and anatomically distinct from pain related to disease progression or to breakthrough pain resulting from development of opioid tolerance. Pain associated with hyperalgesia tends to be more diffuse than the pre-existing pain and less defined in quality. Management of opioid induced hyperalgesia requires opioid dose reduction.  Cancer: Chronic opioid therapy has been associated with an increased risk of cancer   among noncancer patients with chronic pain. This association was more evident in chronic strong opioid users. Chronic opioid consumption causes significant pathological changes in the small intestine and colon. Epidemiological studies have found that there is a link between opium dependence and initiation of gastrointestinal cancers. Cancer is the second leading cause of death after cardiovascular disease. Chronic use of opioids can cause multiple conditions such as GERD, immunosuppression and renal damage as well as carcinogenic effects, which are associated with the incidence of cancers.   Mortality: Long-term opioid use has been associated with increased mortality among patients with chronic non-cancer pain (CNCP).  Prescription of long-acting opioids for chronic noncancer pain was associated with a significantly increased risk of all-cause mortality, including deaths from causes other than overdose.  Reference: Von Korff M, Kolodny A, Deyo RA, Chou R. Long-term opioid therapy reconsidered. Ann Intern Med. 2011  Sep 6;155(5):325-8. doi: 10.7326/[id number]. PMID: 21893626; PMCID: PMC3280085. Bedson J, Chen Y, Ashworth J, Hayward RA, Dunn KM, Jordan KP. Risk of adverse events in patients prescribed long-term opioids: A cohort study in the [place] Clinical Practice Research Datalink. Eur J Pain. 2019 May;23(5):908-922. doi: 10.1002/ejp.1357. Epub 2019 Jan 31. PMID: 30620116. Colameco S, Coren JS, Ciervo CA. Continuous opioid treatment for chronic noncancer pain: a time for moderation in prescribing. Postgrad Med. 2009 Jul;121(4):61-6. doi: 10.3810/pgm.2009.07.2032. PMID: 19641271. Chou R, Turner JA, Devine EB, Hansen RN, Sullivan SD, Blazina I, Dana T, Bougatsos C, Deyo RA. The effectiveness and risks of long-term opioid therapy for chronic pain: a systematic review for a National Institutes of Health Pathways to Prevention Workshop. Ann Intern Med. 2015 Feb 17;162(4):276-86. doi: 10.7326/M14-2559. PMID: 25581257. Warner M, Chen LH, Makuc DM. NCHS Data Brief No. 22. [place]: Centers for Disease Control and Prevention; 2009. Sep, Increase in Fatal Poisonings Involving Opioid Analgesics in the [place], 1999-2006. Song IA, Choi HR, Oh TK. Long-term opioid use and mortality in patients with chronic non-cancer pain: Ten-year follow-up study in [place] from 2010 through 2019. EClinicalMedicine. 2022 Jul 18;51:101558. doi: 10.1016/j.eclinm.2022.101558. PMID: 35875817; PMCID: PMC9304910. Huser, W., Schubert, T., Vogelmann, T. et al. All-cause mortality in patients with long-term opioid therapy compared with non-opioid analgesics for chronic non-cancer pain: a database study. BMC Med 18, 162 (2020). https://doi.org/10.1186/[email or web address] Rashidian H, Zendehdel K, Kamangar F, Malekzadeh R, Haghdoost AA. An Ecological Study of the Association between Opiate Use and Incidence of Cancers. Addict Health. 2016 Fall;8(4):252-260. PMID: 28819556; PMCID: PMC5554805.  Our Goal: Our goal is to control your  pain with means other than the use of opioid pain medications.  Our Recommendation: Talk to your physician about coming off of these medications. We can assist you with the tapering down and stopping these medicines. Based on the new information, even if you cannot completely stop the medication, a decrease in the dose may be associated with a lesser risk. Ask for other means of controlling the pain. Decrease or eliminate those factors that significantly contribute to your pain such as smoking, obesity, and a diet heavily tilted towards "inflammatory" nutrients.  Last Updated: 08/19/2022   ____________________________________________________________________________________________     ____________________________________________________________________________________________  Transfer of Pain Medication between Pharmacies  Re: 2023 DEA Clarification on existing regulation  Published on DEA Website: October 12, 2021  Title: Revised Regulation Allows DEA-Registered Pharmacies to Transfer Electronic Prescriptions at a Patient's Request DEA Headquarters Division - Public Information Office  "Patients now have the ability to request their electronic prescription be transferred to another pharmacy without having to go back to their practitioner to initiate the   request. This revised regulation went into effect on Monday, October 08, 2021.     At a patient's request, a DEA-registered retail pharmacy can now transfer an electronic prescription for a controlled substance (schedules II-V) to another DEA-registered retail pharmacy. Prior to this change, patients would have to go through their practitioner to cancel their prescription and have it re-issued to a different pharmacy. The process was taxing and time consuming for both patients and practitioners.    The Drug Enforcement Administration (DEA) published its intent to revise the process for transferring electronic prescriptions on December 31, 2019.  The final rule was published in the federal register on September 06, 2021 and went into effect 30 days later.  Under the final rule, a prescription can only be transferred once between pharmacies, and only if allowed under existing state or other applicable law. The prescription must remain in its electronic form; may not be altered in any way; and the transfer must be communicated directly between two licensed pharmacists. It's important to note, any authorized refills transfer with the original prescription, which means the entire prescription will be filled at the same pharmacy."    REFERENCES: 1. DEA website announcement https://www.dea.gov/stories/2023/2023-10/2021-09-01/revised-regulation-allows-dea-registered-pharmacies-transfer  2. Department of Justice website  https://www.govinfo.gov/content/pkg/FR-2021-09-06/pdf/2023-15847.pdf  3. DEPARTMENT OF JUSTICE Drug Enforcement Administration 21 CFR Part 1306 [Docket No. DEA-637] RIN [id number] "Transfer of Electronic Prescriptions for Schedules II-V Controlled Substances Between Pharmacies for Initial Filling"  ____________________________________________________________________________________________     _______________________________________________________________________  Medication Rules  Purpose: To inform patients, and their family members, of our medication rules and regulations.  Applies to: All patients receiving prescriptions from our practice (written or electronic).  Pharmacy of record: This is the pharmacy where your electronic prescriptions will be sent. Make sure we have the correct one.  Electronic prescriptions: In compliance with the [place] Strengthen Opioid Misuse Prevention (STOP) Act of 2017 (Session Law 2017-74/H243), effective February 11, 2018, all controlled substances must be electronically prescribed. Written prescriptions, faxing, or calling prescriptions to a pharmacy will no longer be  done.  Prescription refills: These will be provided only during in-person appointments. No medications will be renewed without a "face-to-face" evaluation with your provider. Applies to all prescriptions.  NOTE: The following applies primarily to controlled substances (Opioid* Pain Medications).   Type of encounter (visit): For patients receiving controlled substances, face-to-face visits are required. (Not an option and not up to the patient.)  Patient's responsibilities: Pain Pills: Bring all pain pills to every appointment (except for procedure appointments). Pill Bottles: Bring pills in original pharmacy bottle. Bring bottle, even if empty. Always bring the bottle of the most recent fill.  Medication refills: You are responsible for knowing and keeping track of what medications you are taking and when is it that you will need a refill. The day before your appointment: write a list of all prescriptions that need to be refilled. The day of the appointment: give the list to the admitting nurse. Prescriptions will be written only during appointments. No prescriptions will be written on procedure days. If you forget a medication: it will not be "Called in", "Faxed", or "electronically sent". You will need to get another appointment to get these prescribed. No early refills. Do not call asking to have your prescription filled early. Partial  or short prescriptions: Occasionally your pharmacy may not have enough pills to fill your prescription.  NEVER ACCEPT a partial fill or a prescription that is short of the total amount of pills that you were prescribed.    With controlled substances the law allows 72 hours for the pharmacy to complete the prescription.  If the prescription is not completed within 72 hours, the pharmacist will require a new prescription to be written. This means that you will be short on your medicine and we WILL NOT send another prescription to complete your original prescription.   Instead, request the pharmacy to send a carrier to a nearby branch to get enough medication to provide you with your full prescription. Prescription Accuracy: You are responsible for carefully inspecting your prescriptions before leaving our office. Have the discharge nurse carefully go over each prescription with you, before taking them home. Make sure that your name is accurately spelled, that your address is correct. Check the name and dose of your medication to make sure it is accurate. Check the number of pills, and the written instructions to make sure they are clear and accurate. Make sure that you are given enough medication to last until your next medication refill appointment. Taking Medication: Take medication as prescribed. When it comes to controlled substances, taking less pills or less frequently than prescribed is permitted and encouraged. Never take more pills than instructed. Never take the medication more frequently than prescribed.  Inform other Doctors: Always inform, all of your healthcare providers, of all the medications you take. Pain Medication from other Providers: You are not allowed to accept any additional pain medication from any other Doctor or Healthcare provider. There are two exceptions to this rule. (see below) In the event that you require additional pain medication, you are responsible for notifying us, as stated below. Cough Medicine: Often these contain an opioid, such as codeine or hydrocodone. Never accept or take cough medicine containing these opioids if you are already taking an opioid* medication. The combination may cause respiratory failure and death. Medication Agreement: You are responsible for carefully reading and following our Medication Agreement. This must be signed before receiving any prescriptions from our practice. Safely store a copy of your signed Agreement. Violations to the Agreement will result in no further prescriptions. (Additional copies of  our Medication Agreement are available upon request.) Laws, Rules, & Regulations: All patients are expected to follow all Federal and State Laws, Statutes, Rules, & Regulations. Ignorance of the Laws does not constitute a valid excuse.  Illegal drugs and Controlled Substances: The use of illegal substances (including, but not limited to marijuana and its derivatives) and/or the illegal use of any controlled substances is strictly prohibited. Violation of this rule may result in the immediate and permanent discontinuation of any and all prescriptions being written by our practice. The use of any illegal substances is prohibited. Adopted CDC guidelines & recommendations: Target dosing levels will be at or below 60 MME/day. Use of benzodiazepines** is not recommended.  Exceptions: There are only two exceptions to the rule of not receiving pain medications from other Healthcare Providers. Exception #1 (Emergencies): In the event of an emergency (i.e.: accident requiring emergency care), you are allowed to receive additional pain medication. However, you are responsible for: As soon as you are able, call our office [phone], at any time of the day or night, and leave a message stating your name, the date and nature of the emergency, and the name and dose of the medication prescribed. In the event that your call is answered by a member of our staff, make sure to document and save the date, time, and the name of the person that took your information.  Exception #2 (  Planned Surgery): In the event that you are scheduled by another doctor or dentist to have any type of surgery or procedure, you are allowed (for a period no longer than 30 days), to receive additional pain medication, for the acute post-op pain. However, in this case, you are responsible for picking up a copy of our "Post-op Pain Management for Surgeons" handout, and giving it to your surgeon or dentist. This document is available at our office,  and does not require an appointment to obtain it. Simply go to our office during business hours (Monday-Thursday from 8:00 AM to 4:00 PM) (Friday 8:00 AM to 12:00 Noon) or if you have a scheduled appointment with us, prior to your surgery, and ask for it by name. In addition, you are responsible for: calling our office [phone], at any time of the day or night, and leaving a message stating your name, name of your surgeon, type of surgery, and date of procedure or surgery. Failure to comply with your responsibilities may result in termination of therapy involving the controlled substances. Medication Agreement Violation. Following the above rules, including your responsibilities will help you in avoiding a Medication Agreement Violation ("Breaking your Pain Medication Contract").  Consequences:  Not following the above rules may result in permanent discontinuation of medication prescription therapy.  *Opioid medications include: morphine, codeine, oxycodone, oxymorphone, hydrocodone, hydromorphone, meperidine, tramadol, tapentadol, buprenorphine, fentanyl, methadone. **Benzodiazepine medications include: diazepam (Valium), alprazolam (Xanax), clonazepam (Klonopine), lorazepam (Ativan), clorazepate (Tranxene), chlordiazepoxide (Librium), estazolam (Prosom), oxazepam (Serax), temazepam (Restoril), triazolam (Halcion) (Last updated: 12/04/2021) ______________________________________________________________________    ______________________________________________________________________  Medication Recommendations and Reminders  Applies to: All patients receiving prescriptions (written and/or electronic).  Medication Rules & Regulations: You are responsible for reading, knowing, and following our "Medication Rules" document. These exist for your safety and that of others. They are not flexible and neither are we. Dismissing or ignoring them is an act of "non-compliance" that may result in  complete and irreversible termination of such medication therapy. For safety reasons, "non-compliance" will not be tolerated. As with the [place] fundamental legal principle of "ignorance of the law is no defense", we will accept no excuses for not having read and knowing the content of documents provided to you by our practice.  Pharmacy of record:  Definition: This is the pharmacy where your electronic prescriptions will be sent.  We do not endorse any particular pharmacy. It is up to you and your insurance to decide what pharmacy to use.  We do not restrict you in your choice of pharmacy. However, once we write for your prescriptions, we will NOT be re-sending more prescriptions to fix restricted supply problems created by your pharmacy, or your insurance.  The pharmacy listed in the electronic medical record should be the one where you want electronic prescriptions to be sent. If you choose to change pharmacy, simply notify our nursing staff. Changes will be made only during your regular appointments and not over the phone.  Recommendations: Keep all of your pain medications in a safe place, under lock and key, even if you live alone. We will NOT replace lost, stolen, or damaged medication. We do not accept "Police Reports" as proof of medications having been stolen. After you fill your prescription, take 1 week's worth of pills and put them away in a safe place. You should keep a separate, properly labeled bottle for this purpose. The remainder should be kept in the original bottle. Use this as your primary supply, until it runs out.   Once it's gone, then you know that you have 1 week's worth of medicine, and it is time to come in for a prescription refill. If you do this correctly, it is unlikely that you will ever run out of medicine. To make sure that the above recommendation works, it is very important that you make sure your medication refill appointments are scheduled at least 1 week before you  run out of medicine. To do this in an effective manner, make sure that you do not leave the office without scheduling your next medication management appointment. Always ask the nursing staff to show you in your prescription , when your medication will be running out. Then arrange for the receptionist to get you a return appointment, at least 7 days before you run out of medicine. Do not wait until you have 1 or 2 pills left, to come in. This is very poor planning and does not take into consideration that we may need to cancel appointments due to bad weather, sickness, or emergencies affecting our staff. DO NOT ACCEPT A "Partial Fill": If for any reason your pharmacy does not have enough pills/tablets to completely fill or refill your prescription, do not allow for a "partial fill". The law allows the pharmacy to complete that prescription within 72 hours, without requiring a new prescription. If they do not fill the rest of your prescription within those 72 hours, you will need a separate prescription to fill the remaining amount, which we will NOT provide. If the reason for the partial fill is your insurance, you will need to talk to the pharmacist about payment alternatives for the remaining tablets, but again, DO NOT ACCEPT A PARTIAL FILL, unless you can trust your pharmacist to obtain the remainder of the pills within 72 hours.  Prescription refills and/or changes in medication(s):  Prescription refills, and/or changes in dose or medication, will be conducted only during scheduled medication management appointments. (Applies to both, written and electronic prescriptions.) No refills on procedure days. No medication will be changed or started on procedure days. No changes, adjustments, and/or refills will be conducted on a procedure day. Doing so will interfere with the diagnostic portion of the procedure. No phone refills. No medications will be "called into the pharmacy". No Fax refills. No weekend  refills. No Holliday refills. No after hours refills.  Remember:  Business hours are:  Monday to Thursday 8:00 AM to 4:00 PM Provider's Schedule: Nochum Fenter, MD - Appointments are:  Medication management: Monday and Wednesday 8:00 AM to 4:00 PM Procedure day: Tuesday and Thursday 7:30 AM to 4:00 PM Bilal Lateef, MD - Appointments are:  Medication management: Tuesday and Thursday 8:00 AM to 4:00 PM Procedure day: Monday and Wednesday 7:30 AM to 4:00 PM (Last update: 12/04/2021) ______________________________________________________________________   ____________________________________________________________________________________________  Naloxone Nasal Spray  Why am I receiving this medication? [place] STOP ACT requires that all patients taking high dose opioids or at risk of opioids respiratory depression, be prescribed an opioid reversal agent, such as Naloxone (AKA: Narcan).  What is this medication? NALOXONE (nal OX one) treats opioid overdose, which causes slow or shallow breathing, severe drowsiness, or trouble staying awake. Call emergency services after using this medication. You may need additional treatment. Naloxone works by reversing the effects of opioids. It belongs to a group of medications called opioid blockers.  COMMON BRAND NAME(S): Kloxxado, Narcan  What should I tell my care team before I take this medication? They need to know if you have   any of these conditions: Heart disease Substance use disorder An unusual or allergic reaction to naloxone, other medications, foods, dyes, or preservatives Pregnant or trying to get pregnant Breast-feeding  When to use this medication? This medication is to be used for the treatment of respiratory depression (less than 8 breaths per minute) secondary to opioid overdose.   How to use this medication? This medication is for use in the nose. Lay the person on their back. Support their neck with your hand  and allow the head to tilt back before giving the medication. The nasal spray should be given into 1 nostril. After giving the medication, move the person onto their side. Do not remove or test the nasal spray until ready to use. Get emergency medical help right away after giving the first dose of this medication, even if the person wakes up. You should be familiar with how to recognize the signs and symptoms of a narcotic overdose. If more doses are needed, give the additional dose in the other nostril. Talk to your care team about the use of this medication in children. While this medication may be prescribed for children as young as newborns for selected conditions, precautions do apply.  Naloxone Overdosage: If you think you have taken too much of this medicine contact a poison control center or emergency room at once.  NOTE: This medicine is only for you. Do not share this medicine with others.  What if I miss a dose? This does not apply.  What may interact with this medication? This is only used during an emergency. No interactions are expected during emergency use. This list may not describe all possible interactions. Give your health care provider a list of all the medicines, herbs, non-prescription drugs, or dietary supplements you use. Also tell them if you smoke, drink alcohol, or use illegal drugs. Some items may interact with your medicine.  What should I watch for while using this medication? Keep this medication ready for use in the case of an opioid overdose. Make sure that you have the phone number of your care team and local hospital ready. You may need to have additional doses of this medication. Each nasal spray contains a single dose. Some emergencies may require additional doses. After use, bring the treated person to the nearest hospital or call 911. Make sure the treating care team knows that the person has received a dose of this medication. You will receive additional  instructions on what to do during and after use of this medication before an emergency occurs.  What side effects may I notice from receiving this medication? Side effects that you should report to your care team as soon as possible: Allergic reactions--skin rash, itching, hives, swelling of the face, lips, tongue, or throat Side effects that usually do not require medical attention (report these to your care team if they continue or are bothersome): Constipation Dryness or irritation inside the nose Headache Increase in blood pressure Muscle spasms Stuffy nose Toothache This list may not describe all possible side effects. Call your doctor for medical advice about side effects. You may report side effects to FDA at 1-800-FDA-1088.  Where should I keep my medication? Because this is an emergency medication, you should keep it with you at all times.  Keep out of the reach of children and pets. Store between 20 and 25 degrees C (68 and 77 degrees F). Do not freeze. Throw away any unused medication after the expiration date. Keep in original box   until ready to use.  NOTE: This sheet is a summary. It may not cover all possible information. If you have questions about this medicine, talk to your doctor, pharmacist, or health care provider.   2023 Elsevier/Gold Standard (2020-10-06 00:00:00)  ____________________________________________________________________________________________   

## 2022-08-23 LAB — TOXASSURE SELECT 13 (MW), URINE

## 2022-09-10 ENCOUNTER — Other Ambulatory Visit: Payer: Self-pay | Admitting: Family Medicine

## 2022-09-10 DIAGNOSIS — J432 Centrilobular emphysema: Secondary | ICD-10-CM

## 2022-09-11 NOTE — Telephone Encounter (Signed)
Requested Prescriptions  Pending Prescriptions Disp Refills   SPIRIVA RESPIMAT 2.5 MCG/ACT AERS [Pharmacy Med Name: SPIRIVA RESPIMAT 2.5 MCG/ACT INH AE] 4 g 2    Sig: INHALE 2 PUFFS BY MOUTH INTO THE LUNGS DAILY     Pulmonology:  Anticholinergic Agents Passed - 09/10/2022 11:52 AM      Passed - Valid encounter within last 12 months    Recent Outpatient Visits           2 months ago Chronic pain syndrome   [place] [facility] Smitty Cords, DO   9 months ago Tinea pedis of right foot   [place] [facility] [place], Netta Neat, DO   1 year ago Benign hypertension with CKD (chronic kidney disease) stage III [facility])   [place] [facility] Smitty Cords, DO   1 year ago Chronic right-sided low back pain with bilateral sciatica   [place] [facility] Smitty Cords, DO   1 year ago Acute intractable headache, unspecified headache type   [place] [facility] [place], Salvadore Oxford, NP       Future Appointments             In 3 months Althea Charon, Netta Neat, DO [place] [facility], [facility]

## 2022-09-18 ENCOUNTER — Telehealth: Payer: Self-pay | Admitting: Family Medicine

## 2022-09-18 NOTE — Telephone Encounter (Signed)
Copied from CRM [id number]. Topic: Medicare AWV >> Sep 18, 2022  2:46 PM Payton Doughty wrote: Reason for CRM: Called 09/18/2022 to sched AWV - NO VOICEMAIL  Verlee Rossetti; Care Guide Ambulatory Clinical Support [place] [facility] Direct Dial: [phone]

## 2022-09-19 ENCOUNTER — Other Ambulatory Visit: Payer: 59

## 2022-09-19 ENCOUNTER — Other Ambulatory Visit: Payer: Self-pay | Admitting: *Deleted

## 2022-09-19 DIAGNOSIS — R972 Elevated prostate specific antigen [PSA]: Secondary | ICD-10-CM

## 2022-09-23 ENCOUNTER — Other Ambulatory Visit: Payer: 59

## 2022-09-23 DIAGNOSIS — R972 Elevated prostate specific antigen [PSA]: Secondary | ICD-10-CM

## 2022-09-24 ENCOUNTER — Other Ambulatory Visit: Payer: Self-pay | Admitting: Family Medicine

## 2022-09-24 DIAGNOSIS — J432 Centrilobular emphysema: Secondary | ICD-10-CM

## 2022-09-26 MED ORDER — ALBUTEROL SULFATE HFA 108 (90 BASE) MCG/ACT IN AERS
2.0000 | INHALATION_SPRAY | Freq: Four times a day (QID) | RESPIRATORY_TRACT | Status: DC | PRN
Start: 2022-09-26 — End: 2022-09-26

## 2022-09-26 MED ORDER — ALBUTEROL SULFATE HFA 108 (90 BASE) MCG/ACT IN AERS
2.0000 | INHALATION_SPRAY | Freq: Four times a day (QID) | RESPIRATORY_TRACT | Status: DC | PRN
Start: 2022-09-26 — End: 2023-12-10

## 2022-09-26 NOTE — Telephone Encounter (Signed)
Requested Prescriptions  Pending Prescriptions Disp Refills   albuterol (VENTOLIN HFA) 108 (90 Base) MCG/ACT inhaler [Pharmacy Med Name: ALBUTEROL SULFATE HFA 108 (90 BASE)] 18 g 1 RF    Sig: INHALE 2 PUFFS INTO THE LUNGS EVERY 6 HOURS AS NEEDED FOR WHEEZING OR SHORTNESS OF BREATH     Pulmonology:  Beta Agonists 2 Passed - 09/24/2022  5:07 PM      Passed - Last BP in normal range    BP Readings from Last 1 Encounters:  08/21/22 110/67         Passed - Last Heart Rate in normal range    Pulse Readings from Last 1 Encounters:  08/21/22 86         Passed - Valid encounter within last 12 months    Recent Outpatient Visits           3 months ago Chronic pain syndrome   [place] [facility] [place], Netta Neat, DO   10 months ago Tinea pedis of right foot   [place] [facility] [place], Netta Neat, DO   1 year ago Benign hypertension with CKD (chronic kidney disease) stage III [facility])   [place] [facility] [place], Netta Neat, DO   1 year ago Chronic right-sided low back pain with bilateral sciatica   [facility] Smitty Cords, DO   1 year ago Acute intractable headache, unspecified headache type   [place] [facility] [place], Salvadore Oxford, NP       Future Appointments             In 3 months Althea Charon, Netta Neat, DO [place] [facility], [facility]

## 2022-09-26 NOTE — Telephone Encounter (Signed)
Prescription will not send electronically Called pharmacy and phone order given. Called pharmacy on file and spoke to [employer]. Was advised that his insurance only covers the 8.5 gram

## 2022-09-26 NOTE — Telephone Encounter (Signed)
Initial attempt to reorder med printed. Changed class to normal.

## 2022-09-26 NOTE — Addendum Note (Signed)
Addended by: Josph Macho E on: 09/26/2022 09:29 AM   Modules accepted: Orders

## 2022-09-27 ENCOUNTER — Other Ambulatory Visit: Payer: Self-pay | Admitting: *Deleted

## 2022-09-27 DIAGNOSIS — R972 Elevated prostate specific antigen [PSA]: Secondary | ICD-10-CM

## 2022-10-07 ENCOUNTER — Other Ambulatory Visit: Payer: Self-pay | Admitting: Family Medicine

## 2022-10-07 DIAGNOSIS — G8929 Other chronic pain: Secondary | ICD-10-CM

## 2022-10-07 DIAGNOSIS — F419 Anxiety disorder, unspecified: Secondary | ICD-10-CM

## 2022-10-07 DIAGNOSIS — F4310 Post-traumatic stress disorder, unspecified: Secondary | ICD-10-CM

## 2022-10-08 NOTE — Telephone Encounter (Signed)
Requested Prescriptions  Refused Prescriptions Disp Refills   FLUoxetine (PROZAC) 20 MG capsule [Pharmacy Med Name: FLUOXETINE HCL 20 MG CAP] 180 capsule 3    Sig: TAKE ONE CAPSULE BY MOUTH TWICE A DAY     Psychiatry:  Antidepressants - SSRI Passed - 10/07/2022 10:26 AM      Passed - Completed PHQ-2 or PHQ-9 in the last 360 days      Passed - Valid encounter within last 6 months    Recent Outpatient Visits           3 months ago Chronic pain syndrome   [place] [facility] [place], Netta Neat, DO   10 months ago Tinea pedis of right foot   [place] [facility] [place], Netta Neat, DO   1 year ago Benign hypertension with CKD (chronic kidney disease) stage III [facility])   [place] [facility] [place], Netta Neat, DO   1 year ago Chronic right-sided low back pain with bilateral sciatica   [facility] Smitty Cords, DO   1 year ago Acute intractable headache, unspecified headache type   [place] [facility] [place], Salvadore Oxford, NP       Future Appointments             In 2 months Althea Charon, Netta Neat, DO [place] [facility]             meloxicam (MOBIC) 15 MG tablet [Pharmacy Med Name: MELOXICAM 15 MG TAB] 90 tablet 1    Sig: TAKE 1 TABLET BY MOUTH DAILY AS NEEDED FOR PAIN     Analgesics:  COX2 Inhibitors Failed - 10/07/2022 10:26 AM      Failed - Manual Review: Labs are only required if the patient has taken medication for more than 8 weeks.      Failed - HGB in normal range and within 360 days    Hemoglobin  Date Value Ref Range Status  06/28/2022 11.2 (L) 13.2 - 17.1 g/dL Final  84/13/2440 10.2 12.6 - 17.7 g/dL Final         Failed - HCT in normal range and within 360 days    HCT  Date Value Ref Range Status  06/28/2022 36.0 (L) 38.5 - 50.0 % Final   Hematocrit  Date Value Ref Range Status  05/19/2015 44.5 37.5 -  51.0 % Final         Failed - ALT in normal range and within 360 days    ALT  Date Value Ref Range Status  06/28/2022 8 (L) 9 - 46 U/L Final   SGPT (ALT)  Date Value Ref Range Status  10/15/2013 27 U/L Final    Comment:    14-63 NOTE: New Reference Range 08/31/13          Passed - Cr in normal range and within 360 days    Creat  Date Value Ref Range Status  06/28/2022 1.18 0.70 - 1.22 mg/dL Final         Passed - AST in normal range and within 360 days    AST  Date Value Ref Range Status  06/28/2022 13 10 - 35 U/L Final   SGOT(AST)  Date Value Ref Range Status  10/15/2013 15 15 - 37 Unit/L Final         Passed - eGFR is 30 or above and within 360 days    GFR, Est African  American  Date Value Ref Range Status  01/11/2019 64 > OR = 60 mL/min/1.44m2 Final   GFR, Est Non African American  Date Value Ref Range Status  01/11/2019 55 (L) > OR = 60 mL/min/1.57m2 Final   GFR, Estimated  Date Value Ref Range Status  12/23/2020 >60 >60 mL/min Final    Comment:    (NOTE) Calculated using the CKD-EPI Creatinine Equation (2021)    eGFR  Date Value Ref Range Status  06/28/2022 62 > OR = 60 mL/min/1.61m2 Final  06/13/2021 62 >59 mL/min/1.73 Final         Passed - Patient is not pregnant      Passed - Valid encounter within last 12 months    Recent Outpatient Visits           3 months ago Chronic pain syndrome   [place] [facility] [place], Netta Neat, DO   10 months ago Tinea pedis of right foot   [place] [facility] [place], Netta Neat, DO   1 year ago Benign hypertension with CKD (chronic kidney disease) stage III [facility])   [place] [facility] [place], Netta Neat, DO   1 year ago Chronic right-sided low back pain with bilateral sciatica   [place] [facility] Smitty Cords, DO   1 year ago Acute intractable headache, unspecified headache type   [place]  [facility] [place], Salvadore Oxford, NP       Future Appointments             In 2 months Althea Charon, Netta Neat, DO [place] [facility], [facility]

## 2022-10-11 ENCOUNTER — Ambulatory Visit: Payer: 59

## 2022-10-15 ENCOUNTER — Other Ambulatory Visit: Payer: Self-pay | Admitting: Family Medicine

## 2022-10-15 DIAGNOSIS — F4310 Post-traumatic stress disorder, unspecified: Secondary | ICD-10-CM

## 2022-10-15 DIAGNOSIS — G8929 Other chronic pain: Secondary | ICD-10-CM

## 2022-10-15 DIAGNOSIS — F419 Anxiety disorder, unspecified: Secondary | ICD-10-CM

## 2022-10-28 ENCOUNTER — Ambulatory Visit
Admission: RE | Admit: 2022-10-28 | Discharge: 2022-10-28 | Disposition: A | Discharge status: Home / Self Care | Payer: 59 | Source: Ambulatory Visit | Attending: Urology | Admitting: Urology

## 2022-10-28 DIAGNOSIS — R972 Elevated prostate specific antigen [PSA]: Secondary | ICD-10-CM

## 2022-11-01 ENCOUNTER — Ambulatory Visit
Admission: RE | Admit: 2022-11-01 | Discharge: 2022-11-01 | Disposition: A | Discharge status: Home / Self Care | Payer: 59 | Source: Ambulatory Visit | Attending: Urology | Admitting: Urology

## 2022-11-01 DIAGNOSIS — R972 Elevated prostate specific antigen [PSA]: Secondary | ICD-10-CM | POA: Insufficient documentation

## 2022-11-01 MED ORDER — GADOBUTROL 1 MMOL/ML IV SOLN
9.0000 mL | Freq: Once | INTRAVENOUS | Status: AC | PRN
  Administered 2022-11-01: 9 mL via INTRAVENOUS

## 2022-11-14 ENCOUNTER — Encounter: Payer: Self-pay | Admitting: Urology

## 2022-11-14 ENCOUNTER — Ambulatory Visit: Payer: 59 | Admitting: Urology

## 2022-11-14 VITALS — BP 117/69 | HR 79

## 2022-11-14 DIAGNOSIS — R972 Elevated prostate specific antigen [PSA]: Secondary | ICD-10-CM | POA: Diagnosis not present

## 2022-11-14 NOTE — Progress Notes (Signed)
I, Maysun Anabel Bene, acting as a scribe for Riki Altes, MD., have documented all relevant documentation on the behalf of Riki Altes, MD, as directed by Riki Altes, MD while in the presence of Riki Altes, MD.  11/14/2022 1:30 PM   Charles Ewing 11/09/1940 [id number]  Referring provider: Smitty Cords, DO [address] [place],  [place] [address]  Chief Complaint  Patient presents with   Results   Urologic history: 1. Elevated PSA  PSA has been as high as 8.19 February 2015.  PSA in 2020 was 6.8 and 2022 was 8.29. Last PSA May 2024 was 11.48  HPI: Charles Ewing is a 82 y.o. male presents to discuss prostate MRI results. His son was present.  Repeat PSA 09/23/22 had increased 13.2 and a prostate MRI was recommended which was performed 11/01/22 and was remarkable for a 92 cc gland. There was a PI-RADS 4 lesion in the right posterolateral/posteromedial peripheral zone at the apex. No evidence of extracapsular disease.   PSA trend   PSA  Latest Ref Rng < OR = 4.00 ng/mL  01/11/2019 6.8 (H)   10/30/2020 8.29 (H)   06/28/2022 11.48 (H)       PMH: Past Medical History:  Diagnosis Date   Abdominal aortic aneurysm (AAA) (HCC) 2013   [facility]   Anxiety    Arrhythmia    Arthritis    Colon polyp    COPD (chronic obstructive pulmonary disease) (HCC)    Coronary artery disease    Depression    GERD (gastroesophageal reflux disease)    Hyperlipidemia    Hypertension    Skin cancer    Thrush     Surgical History: Past Surgical History:  Procedure Laterality Date   CATARACT EXTRACTION W/PHACO Left 05/09/2020   Procedure: CATARACT EXTRACTION PHACO AND INTRAOCULAR LENS PLACEMENT (IOC) LEFT DIABETIC 9.09 01:02.0;  Surgeon: Galen Manila, MD;  [facility];  Service: Ophthalmology;  Laterality: Left;   CATARACT EXTRACTION W/PHACO Right 05/23/2020   Procedure: CATARACT EXTRACTION PHACO AND INTRAOCULAR LENS PLACEMENT (IOC) RIGHT DIABETIC  8.13 01:00.3;  Surgeon: Galen Manila, MD;  Location[facility];  Service: Ophthalmology;  Laterality: Right;   Colon polyp surgery     SKIN CANCER EXCISION     TRANSURETHRAL RESECTION OF PROSTATE  2010    Home Medications:  Allergies as of 11/14/2022       Reactions   Nexium [esomeprazole Magnesium]         Medication List        Accurate as of November 14, 2022  1:30 PM. If you have any questions, ask your nurse or doctor.          albuterol 108 (90 Base) MCG/ACT inhaler Commonly known as: VENTOLIN HFA Inhale 2 puffs into the lungs every 6 (six) hours as needed for wheezing or shortness of breath.   amLODipine 5 MG tablet Commonly known as: NORVASC Take 1 tablet (5 mg total) by mouth daily.   aspirin EC 81 MG tablet Take 162 mg by mouth every evening.   cyanocobalamin 1000 MCG tablet Commonly known as: VITAMIN B12 Take 1,000 mcg by mouth daily.   FLUoxetine 20 MG capsule Commonly known as: PROZAC TAKE ONE CAPSULE BY MOUTH TWICE A DAY   fluticasone 50 MCG/ACT nasal spray Commonly known as: FLONASE PLACE 2 SPRAYS INTO BOTH NOSTRILS DAILY   furosemide 20 MG tablet Commonly known as: LASIX Take 1 tablet (  20 mg total) by mouth every other day.   LORazepam 0.5 MG tablet Commonly known as: ATIVAN Take 1 tablet (0.5 mg total) by mouth at bedtime.   losartan 25 MG tablet Commonly known as: COZAAR Take 1 tablet (25 mg total) by mouth daily.   meloxicam 15 MG tablet Commonly known as: MOBIC TAKE 1 TABLET BY MOUTH DAILY AS NEEDED FOR PAIN   naloxone 4 MG/0.1ML Liqd nasal spray kit Commonly known as: NARCAN Place 1 spray into the nose as needed for up to 365 doses (for opioid-induced respiratory depresssion). In case of emergency (overdose), spray once into each nostril. If no response within 3 minutes, repeat application and call 911.   oxyCODONE 5 MG immediate release tablet Commonly known as: Oxy IR/ROXICODONE Take 1 tablet (5 mg total) by  mouth 2 (two) times daily as needed for severe pain. Each refill must last 30 days.   oxyCODONE 5 MG immediate release tablet Commonly known as: Oxy IR/ROXICODONE Take 1 tablet (5 mg total) by mouth 2 (two) times daily as needed for severe pain. Each refill must last 30 days.   oxyCODONE 5 MG immediate release tablet Commonly known as: Oxy IR/ROXICODONE Take 1 tablet (5 mg total) by mouth 2 (two) times daily as needed for severe pain. Each refill must last 30 days.   pantoprazole 20 MG tablet Commonly known as: PROTONIX Take 1 tablet by mouth daily.   potassium chloride 10 MEQ CR capsule Commonly known as: MICRO-K TAKE 1 CAPSULE BY MOUTH EVERY OTHER DAY   simvastatin 20 MG tablet Commonly known as: ZOCOR Take 1 tablet (20 mg total) by mouth at bedtime.   Spiriva Respimat 2.5 MCG/ACT Aers Generic drug: Tiotropium Bromide Monohydrate INHALE 2 PUFFS BY MOUTH INTO THE LUNGS DAILY   tamsulosin 0.4 MG Caps capsule Commonly known as: FLOMAX TAKE 1 CAPSULE BY MOUTH DAILY AFTER BREAKFAST        Allergies:  Allergies  Allergen Reactions   Nexium [Esomeprazole Magnesium]     Family History: Family History  Problem Relation Age of Onset   Kidney cancer Mother    Prostate cancer Neg Hx    Bladder Cancer Neg Hx     Social History:  reports that he has been smoking cigarettes. He has a 70 pack-year smoking history. He has never used smokeless tobacco. He reports that he does not drink alcohol and does not use drugs.   Physical Exam: BP 117/69   Pulse 79   Constitutional:  Alert, No acute distress. HEENT: [place] AT Respiratory: Normal respiratory effort, no increased work of breathing. Neurologic: Grossly intact, no focal deficits, moving all 4 extremities. Psychiatric: Normal mood and affect.   Assessment & Plan:    1. Elevated PSA Prostate MRI with a PI-RADS 4 lesion. We discussed PI-RADS 4 lesions are suspicious for high-grade prostate cancer and typically 40% of these  lesions will be positive for cancer.  Prostate biopsy was discussed. He has had 3 previous negative biopsies.  Based on his age and frailty, even if he was diagnosed with high grade prostate cancer, observation or ADT would be an option.  I recommend that if he wanted to pursue treatment for prostate cancer if diagnosed, then would recommend pursuing biopsy. However, since he is asymptomatic, if he elected observation, then he would be okay to continue surveillance. He has elected the latter and we'll see him back February 2025 with a repeat PSA.   I have reviewed the above documentation for accuracy and  completeness, and I agree with the above.   Riki Altes, MD  [facility] [address], [address] [place], [place] [address] [phone]

## 2022-11-20 DIAGNOSIS — H9202 Otalgia, left ear: Secondary | ICD-10-CM | POA: Diagnosis not present

## 2022-11-20 DIAGNOSIS — T162XXA Foreign body in left ear, initial encounter: Secondary | ICD-10-CM | POA: Diagnosis not present

## 2022-11-20 DIAGNOSIS — K121 Other forms of stomatitis: Secondary | ICD-10-CM | POA: Diagnosis not present

## 2022-11-24 NOTE — Progress Notes (Unsigned)
PROVIDER NOTE: Information contained herein reflects review and annotations entered in association with encounter. Interpretation of such information and data should be left to medically-trained personnel. Information provided to patient can be located elsewhere in the medical record under "Patient Instructions". Document created using STT-dictation technology, any transcriptional errors that may result from process are unintentional.    Patient: [name]  Service Category: E/M  Provider: Oswaldo Done, MD  DOB: 05-Nov-1940  DOS: 11/25/2022  Referring Provider: Saralyn Pilar *  MRN: [id number]  Specialty: Interventional Pain Management  PCP: Smitty Cords, DO  Type: Established Patient  Setting: Ambulatory outpatient    Location: Office  Delivery: Face-to-face     HPI  Mr. BRIC RARDIN, a 82 y.o. year old male, is here today because of his No primary diagnosis found.. Mr. Gahan primary complain today is No chief complaint on file.  Pertinent problems: Mr. Martenson has Osteoarthritis of knees (Bilateral); Bilateral lower extremity edema; Chronic pain syndrome; Chronic hip pain (2ry area of Pain) (Right); Chronic lower extremity pain (3ry area of Pain) (Right); Chronic low back pain (1ry area of Pain) (Bilateral) (R>L) w/o sciatica; Lumbar facet syndrome; Dextroscoliosis of lumbar spine; DDD (degenerative disc disease), lumbosacral; Osteopenia of lumbar spine; Osteopenia determined by x-ray; Osteoarthritis of sacroiliac joints (HCC) (Bilateral); Osteoarthritis of hips (Bilateral); Chronic sacroiliac joint pain (Bilateral); Sacroiliac joint dysfunction (Bilateral); Spondylosis without myelopathy or radiculopathy, lumbosacral region; Other spondylosis, sacral and sacrococcygeal region; Abnormal MRI, lumbar spine (03/11/2022); Chronic low back pain (Bilateral) w/ sciatica (Right); Herniated nucleus pulposus, L4-5 (Right); Lumbosacral radiculopathy at L4 (Right); L4-5 disc bulge  (Right); Spondylolisthesis at L4-L5 level; 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 Pain Assessment: Severity of   is reported as a 8 /10. Location:    / . Onset:  . Quality:  . Timing:  . Modifying factor(s):  Marland Kitchen Vitals:  height is 5\' 11"  (1.803 m) and weight is 205 lb (93 kg). His temperature is 97.2 F (36.2 C) (abnormal). His blood pressure is 118/67 and his pulse is 75. His oxygen saturation is 99%.  BMI: Estimated body mass index is 28.59 kg/m as calculated from the following:   Height as of this encounter: 5\' 11"  (1.803 m).   Weight as of this encounter: 205 lb (93 kg). Last encounter: 08/21/2022. Last procedure: 03/12/2022.  Reason for encounter: medication management.  The patient indicates doing well with the current medication regimen. No adverse reactions or side effects reported to the medications.   RTCB: 02/24/2023   Pharmacotherapy Assessment  Analgesic: Oxycodone IR 5 mg tablet, 1 tab p.o. 2 times daily (#60) MME/day: 15 mg/day   Monitoring: [place] PMP: PDMP reviewed during this encounter.       Pharmacotherapy: No side-effects or adverse reactions reported. Compliance: No problems identified. Effectiveness: Clinically acceptable.  Brigitte Pulse, RN  11/25/2022 12:35 PM  Sign when Signing Visit Nursing Pain Medication Assessment:  Safety precautions to be maintained throughout the outpatient stay will include: orient to surroundings, keep bed in low position, maintain call bell within reach at all times, provide assistance with transfer out of bed and ambulation.  Medication Inspection Compliance: Pill count conducted under aseptic conditions, in front of the patient. Neither the pills nor the bottle was removed from the patient's sight at any time. Once count was completed pills were immediately returned to the patient in their original bottle.  Medication: Oxycodone IR Pill/Patch  Count:  6 of 60 pills remain Pill/Patch Appearance: Markings consistent with prescribed medication Bottle Appearance: Standard pharmacy container. Clearly labeled. Filled Date: 80 / 14 / 2024 Last Medication intake:  TodaySafety precautions to be maintained throughout the outpatient stay will include: orient to surroundings, keep bed in low position, maintain call bell within reach at all times, provide assistance with transfer out of bed and ambulation.     No results found for: "CBDTHCR" No results found for: "D8THCCBX" No results found for: "D9THCCBX"  UDS:  Summary  Date Value Ref Range Status  08/21/2022 Note  Final    Comment:    ==================================================================== [facility] 13 (MW) ==================================================================== Test                             Result       Flag       Units  Drug Present and Declared for Prescription Verification   Lorazepam                      119          EXPECTED   ng/mg creat    Source of lorazepam is a scheduled prescription medication.    Oxycodone                      573          EXPECTED   ng/mg creat   Oxymorphone                    77           EXPECTED   ng/mg creat   Noroxycodone                   1127         EXPECTED   ng/mg creat    Sources of oxycodone include scheduled prescription medications.    Oxymorphone and noroxycodone are expected metabolites of oxycodone.    Oxymorphone is also available as a scheduled prescription medication.  ==================================================================== Test                      Result    Flag   Units      Ref Range   Creatinine              192              mg/dL      >=40 ==================================================================== Declared Medications:  The flagging and interpretation on this report are based on the  following declared medications.  Unexpected results may arise from  inaccuracies in  the declared medications.   **Note: The testing scope of this panel includes these medications:   Lorazepam (Ativan)  Oxycodone   **Note: The testing scope of this panel does not include the  following reported medications:   Albuterol  Amlodipine  Aspirin  Fluoxetine (Prozac)  Fluticasone (Flonase)  Furosemide (Lasix)  Losartan  Meloxicam  Naloxone (Narcan)  Pantoprazole (Protonix)  Potassium Chloride  Simvastatin (Zocor)  Tamsulosin (Flomax)  Tiotropium (Spiriva)  Vitamin B12 ==================================================================== For clinical consultation, please call [phone]. ====================================================================       ROS  Constitutional: Denies any fever or chills Gastrointestinal: No reported hemesis, hematochezia, vomiting, or acute GI distress Musculoskeletal: Denies any acute onset joint swelling, redness, loss of ROM, or weakness Neurological: No reported episodes of acute onset  apraxia, aphasia, dysarthria, agnosia, amnesia, paralysis, loss of coordination, or loss of consciousness  Medication Review  FLUoxetine, LORazepam, Tiotropium Bromide Monohydrate, albuterol, amLODipine, aspirin EC, cyanocobalamin, fluticasone, furosemide, losartan, meloxicam, naloxone, oxyCODONE, pantoprazole, potassium chloride, simvastatin, and tamsulosin  History Review  Allergy: Mr. Swindell is allergic to nexium [esomeprazole magnesium]. Drug: Mr. Bacallao  reports no history of drug use. Alcohol:  reports no history of alcohol use. Tobacco:  reports that he has been smoking cigarettes. He has a 70 pack-year smoking history. He has never used smokeless tobacco. Social: Mr. Bosshardt  reports that he has been smoking cigarettes. He has a 70 pack-year smoking history. He has never used smokeless tobacco. He reports that he does not drink alcohol and does not use drugs. Medical:  has a past medical history of Abdominal aortic aneurysm (AAA)  (HCC) (2013), Anxiety, Arrhythmia, Arthritis, Colon polyp, COPD (chronic obstructive pulmonary disease) (HCC), Coronary artery disease, Depression, GERD (gastroesophageal reflux disease), Hyperlipidemia, Hypertension, Skin cancer, and Thrush. Surgical: Mr. Kabel  has a past surgical history that includes Skin cancer excision; Colon polyp surgery; Transurethral resection of prostate (2010); Cataract extraction w/PHACO (Left, 05/09/2020); and Cataract extraction w/PHACO (Right, 05/23/2020). Family: family history includes Kidney cancer in his mother.  Laboratory Chemistry Profile   Renal Lab Results  Component Value Date   BUN 18 06/28/2022   CREATININE 1.18 06/28/2022   BCR SEE NOTE: 06/28/2022   GFRAA 64 01/11/2019   GFRNONAA >60 12/23/2020    Hepatic Lab Results  Component Value Date   AST 13 06/28/2022   ALT 8 (L) 06/28/2022   ALBUMIN 3.9 06/13/2021   ALKPHOS 70 06/13/2021    Electrolytes Lab Results  Component Value Date   NA 138 06/28/2022   K 4.9 06/28/2022   CL 104 06/28/2022   CALCIUM 8.8 06/28/2022   MG 2.3 06/13/2021    Bone Lab Results  Component Value Date   VD25OH 39 06/28/2022   25OHVITD1 14 (L) 06/13/2021   25OHVITD2 1.8 06/13/2021   25OHVITD3 12 06/13/2021    Inflammation (CRP: Acute Phase) (ESR: Chronic Phase) Lab Results  Component Value Date   CRP 1 06/13/2021   ESRSEDRATE 20 06/13/2021         Note: Above Lab results reviewed.  Recent Imaging Review  MR PROSTATE W WO CONTRAST CLINICAL DATA:  Elevated PSA level.  R97.20.  EXAM: MR PROSTATE WITHOUT AND WITH CONTRAST  TECHNIQUE: Multiplanar multisequence MRI images were obtained of the pelvis centered about the prostate. Pre and post contrast images were obtained.  CONTRAST:  9mL GADAVIST GADOBUTROL 1 MMOL/ML IV SOLN  COMPARISON:  None Available.  FINDINGS: Prostate: Encapsulated nodularity in the transition zone compatible with benign prostatic hypertrophy. Cystic degeneration of  nodularity at the posterior base.  Hazy low T2 signal in the peripheral zone is nonfocal, likely postinflammatory, and is considered PI-RADS category 2. Low T1 signal favoring scarring at the left posterolateral apex as on image 26 of series 3 -2.  Region of interest # 1: Rads category 4 lesion of the right posterolateral posteromedial peripheral zone the apex with focally reduced T2 signal (image 59, series 9) corresponding to reduced ADC map activity and focally restricted diffusion (image 20 of series 7 and 8). This measures 0.42 cc (1.2 by 0.4 by 0.8 cm).  Volume: 3D volumetric analysis: Prostate volume 91.8 cc (5.8 by 5.7 by 6.6 cm).  Transcapsular spread:  Absent  Seminal vesicle involvement: Absent  Neurovascular bundle involvement: Absent  Pelvic adenopathy: Absent  Bone  metastasis: Absent  Other findings: No other significant findings.  IMPRESSION: 1. PI-RADS category 4 lesion of the right posterolateral posteromedial peripheral zone at the apex. Targeting data sent to [facility]. 2. Scarring at the left posterolateral apex. 3. Benign prostatic hypertrophy and prostatomegaly.  Electronically Signed   By: Gaylyn Rong M.D.   On: 11/04/2022 15:24 Note: Reviewed        Physical Exam  General appearance: Well nourished, well developed, and well hydrated. In no apparent acute distress Mental status: Alert, oriented x 3 (person, place, & time)       Respiratory: No evidence of acute respiratory distress Eyes: PERLA Vitals: BP 118/67   Pulse 75   Temp (!) 97.2 F (36.2 C)   Ht 5\' 11"  (1.803 m)   Wt 205 lb (93 kg)   SpO2 99%   BMI 28.59 kg/m  BMI: Estimated body mass index is 28.59 kg/m as calculated from the following:   Height as of this encounter: 5\' 11"  (1.803 m).   Weight as of this encounter: 205 lb (93 kg). Ideal: Ideal body weight: 75.3 kg (166 lb 0.1 oz) Adjusted ideal body weight: 82.4 kg (181 lb 9.7 oz)  Assessment   Diagnosis Status  1.  Chronic pain syndrome   2. Pharmacologic therapy   3. DDD (degenerative disc disease), lumbosacral   4. Osteoarthritis of hips (Bilateral)   5. Encounter for medication management   6. Chronic hip pain (2ry area of Pain) (Right)   7. Encounter for chronic pain management   8. Chronic lower extremity pain (3ry area of Pain) (Right)   9. Chronic use of opiate for therapeutic purpose   10. Lumbar facet syndrome (Right)   11. Chronic low back pain (1ry area of Pain) (Bilateral) (R>L) w/o sciatica   12. Osteoarthritis of sacroiliac joints (HCC) (Bilateral)   13. Dextroscoliosis of lumbar spine    Controlled Controlled Controlled   Updated Problems: No problems updated.  Plan of Care  Problem-specific:  No problem-specific Assessment & Plan notes found for this encounter.  Mr. TAZE CRUNK has a current medication list which includes the following long-term medication(s): albuterol, amlodipine, fluoxetine, fluticasone, furosemide, losartan, potassium chloride, simvastatin, spiriva respimat, [START ON 11/26/2022] oxycodone, [START ON 12/26/2022] oxycodone, and [START ON 01/25/2023] oxycodone.  Pharmacotherapy (Medications Ordered): Meds ordered this encounter  Medications   oxyCODONE (OXY IR/ROXICODONE) 5 MG immediate release tablet    Sig: Take 1 tablet (5 mg total) by mouth 2 (two) times daily as needed for severe pain. Each refill must last 30 days.    Dispense:  60 tablet    Refill:  0    DO NOT: delete (not duplicate); no partial-fill (will deny script to complete), no refill request (F/U required). DISPENSE: 1 day early if closed on fill date. WARN: No CNS-depressants within 8 hrs of med.   oxyCODONE (OXY IR/ROXICODONE) 5 MG immediate release tablet    Sig: Take 1 tablet (5 mg total) by mouth 2 (two) times daily as needed for severe pain. Each refill must last 30 days.    Dispense:  60 tablet    Refill:  0    DO NOT: delete (not duplicate); no partial-fill (will deny script to  complete), no refill request (F/U required). DISPENSE: 1 day early if closed on fill date. WARN: No CNS-depressants within 8 hrs of med.   oxyCODONE (OXY IR/ROXICODONE) 5 MG immediate release tablet    Sig: Take 1 tablet (5 mg total) by mouth 2 (  two) times daily as needed for severe pain. Each refill must last 30 days.    Dispense:  60 tablet    Refill:  0    DO NOT: delete (not duplicate); no partial-fill (will deny script to complete), no refill request (F/U required). DISPENSE: 1 day early if closed on fill date. WARN: No CNS-depressants within 8 hrs of med.   Orders:  No orders of the defined types were placed in this encounter.  Follow-up plan:   Return in about 13 weeks (around 02/24/2023) for Eval-day (M,W), (F2F), (MM).      Interventional Therapies  Risk Factors  Considerations:   Abdominal aortic aneurysm (AAA)  HTN  Cardiomyopathy  CAD  orthostatic hypotension  Irregular heart rhythm CKD  SOB  Tobacco abuse  COPD  GERD   Planned  Pending:      Under consideration:   Diagnostic/therapeutic right L4-5 LESI #2  Diagnostic/therapeutic bilateral IA hip joint inj. #1    Completed:   Diagnostic/therapeutic right L4-5 LESI x1 (03/12/2022)  Therapeutic right lumbar facet RFA x1 (10/18/2021) (100/100/50/50)  Diagnostic right lumbar facet MBB x2 (09/13/2021) (100/100/50/50)    Completed by other providers:   Diagnostic/therapeutic bilateral IA steroid knee inj. (03/15/2016) by Altamese Cabal, PA ([email or web address])    Therapeutic  Palliative (PRN) options:   Therapeutic right lumbar facet MBB       Recent Visits No visits were found meeting these conditions. Showing recent visits within past 90 days and meeting all other requirements Today's Visits Date Type Provider Dept  11/25/22 Office Visit Delano Metz, MD [facility]  Showing today's visits and meeting all other requirements Future Appointments No visits were found meeting these conditions. Showing  future appointments within next 90 days and meeting all other requirements  I discussed the assessment and treatment plan with the patient. The patient was provided an opportunity to ask questions and all were answered. The patient agreed with the plan and demonstrated an understanding of the instructions.  Patient advised to call back or seek an in-person evaluation if the symptoms or condition worsens.  Duration of encounter: 30 minutes.  Total time on encounter, as per AMA guidelines included both the face-to-face and non-face-to-face time personally spent by the physician and/or other qualified health care professional(s) on the day of the encounter (includes time in activities that require the physician or other qualified health care professional and does not include time in activities normally performed by clinical staff). Physician's time may include the following activities when performed: Preparing to see the patient (e.g., pre-charting review of records, searching for previously ordered imaging, lab work, and nerve conduction tests) Review of prior analgesic pharmacotherapies. Reviewing PMP Interpreting ordered tests (e.g., lab work, imaging, nerve conduction tests) Performing post-procedure evaluations, including interpretation of diagnostic procedures Obtaining and/or reviewing separately obtained history Performing a medically appropriate examination and/or evaluation Counseling and educating the patient/family/caregiver Ordering medications, tests, or procedures Referring and communicating with other health care professionals (when not separately reported) Documenting clinical information in the electronic or other health record Independently interpreting results (not separately reported) and communicating results to the patient/ family/caregiver Care coordination (not separately reported)  Note by: Oswaldo Done, MD Date: 11/25/2022; Time: 12:40 PM

## 2022-11-24 NOTE — Patient Instructions (Signed)
____________________________________________________________________________________________  Opioid Pain Medication Update  To: All patients taking opioid pain medications. (I.e.: hydrocodone, hydromorphone, oxycodone, oxymorphone, morphine, codeine, methadone, tapentadol, tramadol, buprenorphine, fentanyl, etc.)  Re: Updated review of side effects and adverse reactions of opioid analgesics, as well as new information about long term effects of this class of medications.  Direct risks of long-term opioid therapy are not limited to opioid addiction and overdose. Potential medical risks include serious fractures, breathing problems during sleep, hyperalgesia, immunosuppression, chronic constipation, bowel obstruction, myocardial infarction, and tooth decay secondary to xerostomia.  Unpredictable adverse effects that can occur even if you take your medication correctly: Cognitive impairment, respiratory depression, and death. Most people think that if they take their medication "correctly", and "as instructed", that they will be safe. Nothing could be farther from the truth. In reality, a significant amount of recorded deaths associated with the use of opioids has occurred in individuals that had taken the medication for a long time, and were taking their medication correctly. The following are examples of how this can happen: Patient taking his/her medication for a long time, as instructed, without any side effects, is given a certain antibiotic or another unrelated medication, which in turn triggers a "Drug-to-drug interaction" leading to disorientation, cognitive impairment, impaired reflexes, respiratory depression or an untoward event leading to serious bodily harm or injury, including death.  Patient taking his/her medication for a long time, as instructed, without any side effects, develops an acute impairment of liver and/or kidney function. This will lead to a rapid inability of the body to  breakdown and eliminate their pain medication, which will result in effects similar to an "overdose", but with the same medicine and dose that they had always taken. This again may lead to disorientation, cognitive impairment, impaired reflexes, respiratory depression or an untoward event leading to serious bodily harm or injury, including death.  A similar problem will occur with patients as they grow older and their liver and kidney function begins to decrease as part of the aging process.  Background information: Historically, the original case for using long-term opioid therapy to treat chronic noncancer pain was based on safety assumptions that subsequent experience has called into question. In 1996, the American Pain Society and the American Academy of Pain Medicine issued a consensus statement supporting long-term opioid therapy. This statement acknowledged the dangers of opioid prescribing but concluded that the risk for addiction was low; respiratory depression induced by opioids was short-lived, occurred mainly in opioid-naive patients, and was antagonized by pain; tolerance was not a common problem; and efforts to control diversion should not constrain opioid prescribing. This has now proven to be wrong. Experience regarding the risks for opioid addiction, misuse, and overdose in community practice has failed to support these assumptions.  According to the Centers for Disease Control and Prevention, fatal overdoses involving opioid analgesics have increased sharply over the past decade. Currently, more than 96,700 people die from drug overdoses every year. Opioids are a factor in 7 out of every 10 overdose deaths. Deaths from drug overdose have surpassed motor vehicle accidents as the leading cause of death for individuals between the ages of 80 and 61.  Clinical data suggest that neuroendocrine dysfunction may be very common in both men and women, potentially causing hypogonadism, erectile  dysfunction, infertility, decreased libido, osteoporosis, and depression. Recent studies linked higher opioid dose to increased opioid-related mortality. Controlled observational studies reported that long-term opioid therapy may be associated with increased risk for cardiovascular events. Subsequent meta-analysis concluded  that the safety of long-term opioid therapy in elderly patients has not been proven.   Side Effects and adverse reactions: Common side effects: Drowsiness (sedation). Dizziness. Nausea and vomiting. Constipation. Physical dependence -- Dependence often manifests with withdrawal symptoms when opioids are discontinued or decreased. Tolerance -- As you take repeated doses of opioids, you require increased medication to experience the same effect of pain relief. Respiratory depression -- This can occur in healthy people, especially with higher doses. However, people with COPD, asthma or other lung conditions may be even more susceptible to fatal respiratory impairment.  Uncommon side effects: An increased sensitivity to feeling pain and extreme response to pain (hyperalgesia). Chronic use of opioids can lead to this. Delayed gastric emptying (the process by which the contents of your stomach are moved into your small intestine). Muscle rigidity. Immune system and hormonal dysfunction. Quick, involuntary muscle jerks (myoclonus). Arrhythmia. Itchy skin (pruritus). Dry mouth (xerostomia).  Long-term side effects: Chronic constipation. Sleep-disordered breathing (SDB). Increased risk of bone fractures. Hypothalamic-pituitary-adrenal dysregulation. Increased risk of overdose.  RISKS: Respiratory depression and death: Opioids increase the risk of respiratory depression and death.  Drug-to-drug interactions: Opioids are relatively contraindicated in combination with benzodiazepines, sleep inducers, and other central nervous system depressants. Other classes of medications  (i.e.: certain antibiotics and even over-the-counter medications) may also trigger or induce respiratory depression in some patients.  Medical conditions: Patients with pre-existing respiratory problems are at higher risk of respiratory failure and/or depression when in combination with opioid analgesics. Opioids are relatively contraindicated in some medical conditions such as central sleep apnea.   Fractures and Falls:  Opioids increase the risk and incidence of falls. This is of particular importance in elderly patients.  Endocrine System:  Long-term administration is associated with endocrine abnormalities (endocrinopathies). (Also known as Opioid-induced Endocrinopathy) Influences on both the hypothalamic-pituitary-adrenal axis?and the hypothalamic-pituitary-gonadal axis have been demonstrated with consequent hypogonadism and adrenal insufficiency in both sexes. Hypogonadism and decreased levels of dehydroepiandrosterone sulfate have been reported in men and women. Endocrine effects include: Amenorrhoea in women (abnormal absence of menstruation) Reduced libido in both sexes Decreased sexual function Erectile dysfunction in men Hypogonadisms (decreased testicular function with shrinkage of testicles) Infertility Depression and fatigue Loss of muscle mass Anxiety Depression Immune suppression Hyperalgesia Weight gain Anemia Osteoporosis Patients (particularly women of childbearing age) should avoid opioids. There is insufficient evidence to recommend routine monitoring of asymptomatic patients taking opioids in the long-term for hormonal deficiencies.  Immune System: Human studies have demonstrated that opioids have an immunomodulating effect. These effects are mediated via opioid receptors both on immune effector cells and in the central nervous system. Opioids have been demonstrated to have adverse effects on antimicrobial response and anti-tumour surveillance. Buprenorphine has  been demonstrated to have no impact on immune function.  Opioid Induced Hyperalgesia: Human studies have demonstrated that prolonged use of opioids can lead to a state of abnormal pain sensitivity, sometimes called opioid induced hyperalgesia (OIH). Opioid induced hyperalgesia is not usually seen in the absence of tolerance to opioid analgesia. Clinically, hyperalgesia may be diagnosed if the patient on long-term opioid therapy presents with increased pain. This might be qualitatively and anatomically distinct from pain related to disease progression or to breakthrough pain resulting from development of opioid tolerance. Pain associated with hyperalgesia tends to be more diffuse than the pre-existing pain and less defined in quality. Management of opioid induced hyperalgesia requires opioid dose reduction.  Cancer: Chronic opioid therapy has been associated with an increased risk of cancer  among noncancer patients with chronic pain. This association was more evident in chronic strong opioid users. Chronic opioid consumption causes significant pathological changes in the small intestine and colon. Epidemiological studies have found that there is a link between opium dependence and initiation of gastrointestinal cancers. Cancer is the second leading cause of death after cardiovascular disease. Chronic use of opioids can cause multiple conditions such as GERD, immunosuppression and renal damage as well as carcinogenic effects, which are associated with the incidence of cancers.   Mortality: Long-term opioid use has been associated with increased mortality among patients with chronic non-cancer pain (CNCP).  Prescription of long-acting opioids for chronic noncancer pain was associated with a significantly increased risk of all-cause mortality, including deaths from causes other than overdose.  Reference: Von Korff M, Kolodny A, Deyo RA, Chou R. Long-term opioid therapy reconsidered. Ann Intern Med. 2011  Sep 6;155(5):325-8. doi: 10.7326/[id number]. PMID: 64403474; PMCID: [id number]. Randon Goldsmith, Hayward RA, Dunn KM, [place] KP. Risk of adverse events in patients prescribed long-term opioids: A cohort study in the [place] [facility] Research Datalink. Eur J Pain. 2019 May;23(5):908-922. doi: 10.1002/ejp.1357. Epub 2019 Jan 31. PMID: 43329518. Colameco S, Coren JS, Ciervo CA. Continuous opioid treatment for chronic noncancer pain: a time for moderation in prescribing. Postgrad Med. 2009 Jul;121(4):61-6. doi: 10.3810/pgm.2009.07.2032. PMID: 84166063. William Hamburger RN, [place] SD, Blazina I, Cristopher Peru, Bougatsos C, Deyo RA. The effectiveness and risks of long-term opioid therapy for chronic pain: a systematic review for a [employer] of [employer] Pathways to [employer]. Ann Intern Med. 2015 Feb 17;162(4):276-86. doi: 10.7326/M14-2559. PMID: 01601093. Caryl Bis [facility], Makuc DM. NCHS Data Brief No. 22. [place]: Centers for Disease Control and Prevention; 2009. Sep, Increase in Fatal Poisonings Involving Opioid Analgesics in the [place], 1999-2006. Song IA, Choi HR, Oh TK. Long-term opioid use and mortality in patients with chronic non-cancer pain: Ten-year follow-up study in [place] from 2010 through 2019. EClinicalMedicine. 2022 Jul 18;51:101558. doi: 10.1016/j.eclinm.2022.235573. PMID: 22025427; PMCID: [id number]. Huser, W., Schubert, T., Vogelmann, T. et al. All-cause mortality in patients with long-term opioid therapy compared with non-opioid analgesics for chronic non-cancer pain: a database study. BMC Med 18, 162 (2020). http://lester.info/ Rashidian H, Karie Kirks, Malekzadeh R, Haghdoost AA. An Ecological Study of the Association between Opiate Use and Incidence of Cancers. Addict Health. 2016 Fall;8(4):252-260. PMID: 51761607; PMCID: [id number].  Our Goal: Our goal is to control your  pain with means other than the use of opioid pain medications.  Our Recommendation: Talk to your physician about coming off of these medications. We can assist you with the tapering down and stopping these medicines. Based on the new information, even if you cannot completely stop the medication, a decrease in the dose may be associated with a lesser risk. Ask for other means of controlling the pain. Decrease or eliminate those factors that significantly contribute to your pain such as smoking, obesity, and a diet heavily tilted towards "inflammatory" nutrients.  Last Updated: 08/19/2022   ____________________________________________________________________________________________     ____________________________________________________________________________________________  National Pain Medication Shortage  The [place] is experiencing worsening drug shortages. These have had a negative widespread effect on patient care and treatment. Not expected to improve any time soon. Predicted to last past 2029.   Drug shortage list (generic names) Oxycodone IR Oxycodone/APAP Oxymorphone IR Hydromorphone Hydrocodone/APAP Morphine  Where is the problem?  Manufacturing and supply level.  Will this shortage affect you?  Only if you  take any of the above pain medications.  How? You may be unable to fill your prescription.  Your pharmacist may offer a "partial fill" of your prescription. (Warning: Do not accept partial fills.) Prescriptions partially filled cannot be transferred to another pharmacy. Read our Medication Rules and Regulation. Depending on how much medicine you are dependent on, you may experience withdrawals when unable to get the medication.  Recommendations: Consider ending your dependence on opioid pain medications. Ask your pain specialist to assist you with the process. Consider switching to a medication currently not in shortage, such as Buprenorphine. Talk to your pain  specialist about this option. Consider decreasing your pain medication requirements by managing tolerance thru "Drug Holidays". This may help minimize withdrawals, should you run out of medicine. Control your pain thru the use of non-pharmacological interventional therapies.   Your prescriber: Prescribers cannot be blamed for shortages. Medication manufacturing and supply issues cannot be fixed by the prescriber.   NOTE: The prescriber is not responsible for supplying the medication, or solving supply issues. Work with your pharmacist to solve it. The patient is responsible for the decision to take or continue taking the medication and for identifying and securing a legal supply source. By law, supplying the medication is the job and responsibility of the pharmacy. The prescriber is responsible for the evaluation, monitoring, and prescribing of these medications.   Prescribers will NOT: Re-issue prescriptions that have been partially filled. Re-issue prescriptions already sent to a pharmacy.  Re-send prescriptions to a different pharmacy because yours did not have your medication. Ask pharmacist to order more medicine or transfer the prescription to another pharmacy. (Read below.)  New 2023 regulation: "October 12, 2021 Revised Regulation Allows DEA-Registered Pharmacies to Transfer Electronic Prescriptions at a Patient's Request DEA Headquarters Division - Public Information Office Patients now have the ability to request their electronic prescription be transferred to another pharmacy without having to go back to their practitioner to initiate the request. This revised regulation went into effect on Monday, October 08, 2021.     At a patient's request, a DEA-registered retail pharmacy can now transfer an electronic prescription for a controlled substance (schedules II-V) to another DEA-registered retail pharmacy. Prior to this change, patients would have to go through their practitioner to  cancel their prescription and have it re-issued to a different pharmacy. The process was taxing and time consuming for both patients and practitioners.    The Drug Enforcement Administration [facility]) published its intent to revise the process for transferring electronic prescriptions on December 31, 2019.  The final rule was published in the federal register on September 06, 2021 and went into effect 30 days later.  Under the final rule, a prescription can only be transferred once between pharmacies, and only if allowed under existing state or other applicable law. The prescription must remain in its electronic form; may not be altered in any way; and the transfer must be communicated directly between two licensed pharmacists. It's important to note, any authorized refills transfer with the original prescription, which means the entire prescription will be filled at the same pharmacy".  Reference: HugeHand.is [facility] website announcement)  [email or web address] [employer] of Justice)   [employer] / Vol. 88, No. 143 / Thursday, September 06, 2021 / Rules and Regulations DEPARTMENT OF JUSTICE  Drug Enforcement Administration  21 CFR Part 1306  [Docket No. [id number]]  RIN [id number] Transfer of Electronic Prescriptions for Schedules II-V Controlled Substances Between Pharmacies for Initial Filling  ____________________________________________________________________________________________  ____________________________________________________________________________________________  Transfer of Pain Medication between Pharmacies  Re: 2023 DEA Clarification on existing regulation  Published on DEA Website: October 12, 2021  Title: Revised Regulation Allows DEA-Registered Pharmacies to Electrical engineer Prescriptions at a Patient's  Request DEA Headquarters Division - [employer]  "Patients now have the ability to request their electronic prescription be transferred to another pharmacy without having to go back to their practitioner to initiate the request. This revised regulation went into effect on Monday, October 08, 2021.     At a patient's request, a DEA-registered retail pharmacy can now transfer an electronic prescription for a controlled substance (schedules II-V) to another DEA-registered retail pharmacy. Prior to this change, patients would have to go through their practitioner to cancel their prescription and have it re-issued to a different pharmacy. The process was taxing and time consuming for both patients and practitioners.    The Drug Enforcement Administration [facility]) published its intent to revise the process for transferring electronic prescriptions on December 31, 2019.  The final rule was published in the federal register on September 06, 2021 and went into effect 30 days later.  Under the final rule, a prescription can only be transferred once between pharmacies, and only if allowed under existing state or other applicable law. The prescription must remain in its electronic form; may not be altered in any way; and the transfer must be communicated directly between two licensed pharmacists. It's important to note, any authorized refills transfer with the original prescription, which means the entire prescription will be filled at the same pharmacy."    REFERENCES: 1. DEA website announcement [email or web address]  2. Department of Justice website  [email or web address].pdf  3. DEPARTMENT OF JUSTICE Drug Enforcement Administration 21 CFR Part 1306 [Docket No. [id number]] RIN [id number] "Transfer of Electronic Prescriptions for Schedules II-V Controlled Substances  Between Pharmacies for Initial Filling"  ____________________________________________________________________________________________     _______________________________________________________________________  Medication Rules  Purpose: To inform patients, and their family members, of our medication rules and regulations.  Applies to: All patients receiving prescriptions from our practice (written or electronic).  Pharmacy of record: This is the pharmacy where your electronic prescriptions will be sent. Make sure we have the correct one.  Electronic prescriptions: In compliance with the [facility] Strengthen Opioid Misuse Prevention (STOP) Act of 2017 (Session Conni Elliot [phone]), effective February 11, 2018, all controlled substances must be electronically prescribed. Written prescriptions, faxing, or calling prescriptions to a pharmacy will no longer be done.  Prescription refills: These will be provided only during in-person appointments. No medications will be renewed without a "face-to-face" evaluation with your provider. Applies to all prescriptions.  NOTE: The following applies primarily to controlled substances (Opioid* Pain Medications).   Type of encounter (visit): For patients receiving controlled substances, face-to-face visits are required. (Not an option and not up to the patient.)  Patient's responsibilities: Pain Pills: Bring all pain pills to every appointment (except for procedure appointments). Pill Bottles: Bring pills in original pharmacy bottle. Bring bottle, even if empty. Always bring the bottle of the most recent fill.  Medication refills: You are responsible for knowing and keeping track of what medications you are taking and when is it that you will need a refill. The day before your appointment: write a list of all prescriptions that need to be refilled. The day of the appointment: give the list to the admitting nurse. Prescriptions will be written only  during appointments. No prescriptions will be written on procedure days. If you forget a  medication: it will not be "Called in", "Faxed", or "electronically sent". You will need to get another appointment to get these prescribed. No early refills. Do not call asking to have your prescription filled early. Partial  or short prescriptions: Occasionally your pharmacy may not have enough pills to fill your prescription.  NEVER ACCEPT a partial fill or a prescription that is short of the total amount of pills that you were prescribed.  With controlled substances the law allows 72 hours for the pharmacy to complete the prescription.  If the prescription is not completed within 72 hours, the pharmacist will require a new prescription to be written. This means that you will be short on your medicine and we WILL NOT send another prescription to complete your original prescription.  Instead, request the pharmacy to send a carrier to a nearby branch to get enough medication to provide you with your full prescription. Prescription Accuracy: You are responsible for carefully inspecting your prescriptions before leaving our office. Have the discharge nurse carefully go over each prescription with you, before taking them home. Make sure that your name is accurately spelled, that your address is correct. Check the name and dose of your medication to make sure it is accurate. Check the number of pills, and the written instructions to make sure they are clear and accurate. Make sure that you are given enough medication to last until your next medication refill appointment. Taking Medication: Take medication as prescribed. When it comes to controlled substances, taking less pills or less frequently than prescribed is permitted and encouraged. Never take more pills than instructed. Never take the medication more frequently than prescribed.  Inform other Doctors: Always inform, all of your healthcare providers, of all the  medications you take. Pain Medication from other Providers: You are not allowed to accept any additional pain medication from any other Doctor or Healthcare provider. There are two exceptions to this rule. (see below) In the event that you require additional pain medication, you are responsible for notifying us, as stated below. Cough Medicine: Often these contain an opioid, such as codeine or hydrocodone. Never accept or take cough medicine containing these opioids if you are already taking an opioid* medication. The combination may cause respiratory failure and death. Medication Agreement: You are responsible for carefully reading and following our Medication Agreement. This must be signed before receiving any prescriptions from our practice. Safely store a copy of your signed Agreement. Violations to the Agreement will result in no further prescriptions. (Additional copies of our Medication Agreement are available upon request.) Laws, Rules, & Regulations: All patients are expected to follow all [address] and [employer], [employer], Rules, [place] [place]. Ignorance of the Laws does not constitute a valid excuse.  Illegal drugs and Controlled Substances: The use of illegal substances (including, but not limited to marijuana and its derivatives) and/or the illegal use of any controlled substances is strictly prohibited. Violation of this rule may result in the immediate and permanent discontinuation of any and all prescriptions being written by our practice. The use of any illegal substances is prohibited. Adopted CDC guidelines & recommendations: Target dosing levels will be at or below 60 MME/day. Use of benzodiazepines** is not recommended.  Exceptions: There are only two exceptions to the rule of not receiving pain medications from other Healthcare Providers. Exception #1 (Emergencies): In the event of an emergency (i.e.: accident requiring emergency care), you are allowed to receive additional pain  medication. However, you are responsible for: As soon as  you are able, call our office [phone], at any time of the day or night, and leave a message stating your name, the date and nature of the emergency, and the name and dose of the medication prescribed. In the event that your call is answered by a member of our staff, make sure to document and save the date, time, and the name of the person that took your information.  Exception #2 (Planned Surgery): In the event that you are scheduled by another doctor or dentist to have any type of surgery or procedure, you are allowed (for a period no longer than 30 days), to receive additional pain medication, for the acute post-op pain. However, in this case, you are responsible for picking up a copy of our "Post-op Pain Management for Surgeons" handout, and giving it to your surgeon or dentist. This document is available at our office, and does not require an appointment to obtain it. Simply go to our office during business hours (Monday-Thursday from 8:00 AM to 4:00 PM) (Friday 8:00 AM to 12:00 Noon) or if you have a scheduled appointment with [place], prior to your surgery, and ask for it by name. In addition, you are responsible for: calling our office [phone], at any time of the day or night, and leaving a message stating your name, name of your surgeon, type of surgery, and date of procedure or surgery. Failure to comply with your responsibilities may result in termination of therapy involving the controlled substances. Medication Agreement Violation. Following the above rules, including your responsibilities will help you in avoiding a Medication Agreement Violation ("Breaking your Pain Medication Contract").  Consequences:  Not following the above rules may result in permanent discontinuation of medication prescription therapy.  *Opioid medications include: morphine, codeine, oxycodone, oxymorphone, hydrocodone, hydromorphone, meperidine, tramadol,  tapentadol, buprenorphine, fentanyl, methadone. **Benzodiazepine medications include: diazepam (Valium), alprazolam (Xanax), clonazepam (Klonopine), lorazepam (Ativan), clorazepate (Tranxene), chlordiazepoxide (Librium), estazolam (Prosom), oxazepam (Serax), temazepam (Restoril), triazolam (Halcion) (Last updated: 12/04/2021) ______________________________________________________________________    ______________________________________________________________________  Medication Recommendations and Reminders  Applies to: All patients receiving prescriptions (written and/or electronic).  Medication Rules & Regulations: You are responsible for reading, knowing, and following our "Medication Rules" document. These exist for your safety and that of others. They are not flexible and neither are we. Dismissing or ignoring them is an act of "non-compliance" that may result in complete and irreversible termination of such medication therapy. For safety reasons, "non-compliance" will not be tolerated. As with the [place] fundamental legal principle of "ignorance of the law is no defense", we will accept no excuses for not having read and knowing the content of documents provided to you by our practice.  Pharmacy of record:  Definition: This is the pharmacy where your electronic prescriptions will be sent.  We do not endorse any particular pharmacy. It is up to you and your insurance to decide what pharmacy to use.  We do not restrict you in your choice of pharmacy. However, once we write for your prescriptions, we will NOT be re-sending more prescriptions to fix restricted supply problems created by your pharmacy, or your insurance.  The pharmacy listed in the electronic medical record should be the one where you want electronic prescriptions to be sent. If you choose to change pharmacy, simply notify our nursing staff. Changes will be made only during your regular appointments and not over the  phone.  Recommendations: Keep all of your pain medications in a safe place, under lock and key, even  if you live alone. We will NOT replace lost, stolen, or damaged medication. We do not accept "Police Reports" as proof of medications having been stolen. After you fill your prescription, take 1 week's worth of pills and put them away in a safe place. You should keep a separate, properly labeled bottle for this purpose. The remainder should be kept in the original bottle. Use this as your primary supply, until it runs out. Once it's gone, then you know that you have 1 week's worth of medicine, and it is time to come in for a prescription refill. If you do this correctly, it is unlikely that you will ever run out of medicine. To make sure that the above recommendation works, it is very important that you make sure your medication refill appointments are scheduled at least 1 week before you run out of medicine. To do this in an effective manner, make sure that you do not leave the office without scheduling your next medication management appointment. Always ask the nursing staff to show you in your prescription , when your medication will be running out. Then arrange for the receptionist to get you a return appointment, at least 7 days before you run out of medicine. Do not wait until you have 1 or 2 pills left, to come in. This is very poor planning and does not take into consideration that we may need to cancel appointments due to bad weather, sickness, or emergencies affecting our staff. DO NOT ACCEPT A "Partial Fill": If for any reason your pharmacy does not have enough pills/tablets to completely fill or refill your prescription, do not allow for a "partial fill". The law allows the pharmacy to complete that prescription within 72 hours, without requiring a new prescription. If they do not fill the rest of your prescription within those 72 hours, you will need a separate prescription to fill the remaining  amount, which we will NOT provide. If the reason for the partial fill is your insurance, you will need to talk to the pharmacist about payment alternatives for the remaining tablets, but again, DO NOT ACCEPT A PARTIAL FILL, unless you can trust your pharmacist to obtain the remainder of the pills within 72 hours.  Prescription refills and/or changes in medication(s):  Prescription refills, and/or changes in dose or medication, will be conducted only during scheduled medication management appointments. (Applies to both, written and electronic prescriptions.) No refills on procedure days. No medication will be changed or started on procedure days. No changes, adjustments, and/or refills will be conducted on a procedure day. Doing so will interfere with the diagnostic portion of the procedure. No phone refills. No medications will be "called into the pharmacy". No Fax refills. No weekend refills. No Holliday refills. No after hours refills.  Remember:  Business hours are:  Monday to Thursday 8:00 AM to 4:00 PM Provider's Schedule: Delano Metz, MD - Appointments are:  Medication management: Monday and Wednesday 8:00 AM to 4:00 PM Procedure day: Tuesday and Thursday 7:30 AM to 4:00 PM Edward Jolly, MD - Appointments are:  Medication management: Tuesday and Thursday 8:00 AM to 4:00 PM Procedure day: Monday and Wednesday 7:30 AM to 4:00 PM (Last update: 12/04/2021) ______________________________________________________________________   ____________________________________________________________________________________________  Naloxone Nasal Spray  Why am I receiving this medication? [place] STOP ACT requires that all patients taking high dose opioids or at risk of opioids respiratory depression, be prescribed an opioid reversal agent, such as Naloxone (AKA: Narcan).  What is this medication? NALOXONE (  nal OX one) treats opioid overdose, which causes slow or shallow breathing,  severe drowsiness, or trouble staying awake. Call emergency services after using this medication. You may need additional treatment. Naloxone works by reversing the effects of opioids. It belongs to a group of medications called opioid blockers.  COMMON BRAND NAME(S): Kloxxado, Narcan  What should I tell my care team before I take this medication? They need to know if you have any of these conditions: Heart disease Substance use disorder An unusual or allergic reaction to naloxone, other medications, foods, dyes, or preservatives Pregnant or trying to get pregnant Breast-feeding  When to use this medication? This medication is to be used for the treatment of respiratory depression (less than 8 breaths per minute) secondary to opioid overdose.   How to use this medication? This medication is for use in the nose. Lay the person on their back. Support their neck with your hand and allow the head to tilt back before giving the medication. The nasal spray should be given into 1 nostril. After giving the medication, move the person onto their side. Do not remove or test the nasal spray until ready to use. Get emergency medical help right away after giving the first dose of this medication, even if the person wakes up. You should be familiar with how to recognize the signs and symptoms of a narcotic overdose. If more doses are needed, give the additional dose in the other nostril. Talk to your care team about the use of this medication in children. While this medication may be prescribed for children as young as newborns for selected conditions, precautions do apply.  Naloxone Overdosage: If you think you have taken too much of this medicine contact a poison control center or emergency room at once.  NOTE: This medicine is only for you. Do not share this medicine with others.  What if I miss a dose? This does not apply.  What may interact with this medication? This is only used during an  emergency. No interactions are expected during emergency use. This list may not describe all possible interactions. Give your health care provider a list of all the medicines, herbs, non-prescription drugs, or dietary supplements you use. Also tell them if you smoke, drink alcohol, or use illegal drugs. Some items may interact with your medicine.  What should I watch for while using this medication? Keep this medication ready for use in the case of an opioid overdose. Make sure that you have the phone number of your care team and local hospital ready. You may need to have additional doses of this medication. Each nasal spray contains a single dose. Some emergencies may require additional doses. After use, bring the treated person to the nearest hospital or call 911. Make sure the treating care team knows that the person has received a dose of this medication. You will receive additional instructions on what to do during and after use of this medication before an emergency occurs.  What side effects may I notice from receiving this medication? Side effects that you should report to your care team as soon as possible: Allergic reactions--skin rash, itching, hives, swelling of the face, lips, tongue, or throat Side effects that usually do not require medical attention (report these to your care team if they continue or are bothersome): Constipation Dryness or irritation inside the nose Headache Increase in blood pressure Muscle spasms Stuffy nose Toothache This list may not describe all possible side effects. Call your doctor for  medical advice about side effects. You may report side effects to FDA at 1-800-FDA-1088.  Where should I keep my medication? Because this is an emergency medication, you should keep it with you at all times.  Keep out of the reach of children and pets. Store between 20 and 25 degrees C (68 and 77 degrees F). Do not freeze. Throw away any unused medication after the  expiration date. Keep in original box until ready to use.  NOTE: This sheet is a summary. It may not cover all possible information. If you have questions about this medicine, talk to your doctor, pharmacist, or health care provider.   2023 Elsevier/Gold Standard (2020-10-06 00:00:00)  ____________________________________________________________________________________________

## 2022-11-25 ENCOUNTER — Encounter: Payer: Self-pay | Admitting: Pain Medicine

## 2022-11-25 ENCOUNTER — Ambulatory Visit: Payer: 59 | Attending: Pain Medicine | Admitting: Pain Medicine

## 2022-11-25 DIAGNOSIS — Z79891 Long term (current) use of opiate analgesic: Secondary | ICD-10-CM | POA: Diagnosis not present

## 2022-11-25 DIAGNOSIS — G8929 Other chronic pain: Secondary | ICD-10-CM | POA: Diagnosis not present

## 2022-11-25 DIAGNOSIS — M4186 Other forms of scoliosis, lumbar region: Secondary | ICD-10-CM | POA: Insufficient documentation

## 2022-11-25 DIAGNOSIS — M160 Bilateral primary osteoarthritis of hip: Secondary | ICD-10-CM | POA: Diagnosis not present

## 2022-11-25 DIAGNOSIS — M79604 Pain in right leg: Secondary | ICD-10-CM | POA: Insufficient documentation

## 2022-11-25 DIAGNOSIS — G894 Chronic pain syndrome: Secondary | ICD-10-CM | POA: Diagnosis not present

## 2022-11-25 DIAGNOSIS — Z79899 Other long term (current) drug therapy: Secondary | ICD-10-CM | POA: Diagnosis not present

## 2022-11-25 DIAGNOSIS — M461 Sacroiliitis, not elsewhere classified: Secondary | ICD-10-CM | POA: Insufficient documentation

## 2022-11-25 DIAGNOSIS — M545 Low back pain, unspecified: Secondary | ICD-10-CM | POA: Diagnosis not present

## 2022-11-25 DIAGNOSIS — M25551 Pain in right hip: Secondary | ICD-10-CM | POA: Diagnosis not present

## 2022-11-25 DIAGNOSIS — M47816 Spondylosis without myelopathy or radiculopathy, lumbar region: Secondary | ICD-10-CM | POA: Insufficient documentation

## 2022-11-25 DIAGNOSIS — M51379 Other intervertebral disc degeneration, lumbosacral region without mention of lumbar back pain or lower extremity pain: Secondary | ICD-10-CM

## 2022-11-25 MED ORDER — OXYCODONE HCL 5 MG PO TABS
5.0000 mg | ORAL_TABLET | Freq: Two times a day (BID) | ORAL | 0 refills | Status: DC | PRN
Start: 2022-12-26 — End: 2023-02-18

## 2022-11-25 MED ORDER — OXYCODONE HCL 5 MG PO TABS
5.0000 mg | ORAL_TABLET | Freq: Two times a day (BID) | ORAL | 0 refills | Status: DC | PRN
Start: 2023-01-25 — End: 2023-02-18

## 2022-11-25 MED ORDER — OXYCODONE HCL 5 MG PO TABS: 5.0000 mg | ORAL_TABLET | Freq: Two times a day (BID) | ORAL | 0 refills | Status: DC | PRN

## 2022-11-25 NOTE — Progress Notes (Signed)
Nursing Pain Medication Assessment:  Safety precautions to be maintained throughout the outpatient stay will include: orient to surroundings, keep bed in low position, maintain call bell within reach at all times, provide assistance with transfer out of bed and ambulation.  Medication Inspection Compliance: Pill count conducted under aseptic conditions, in front of the patient. Neither the pills nor the bottle was removed from the patient's sight at any time. Once count was completed pills were immediately returned to the patient in their original bottle.  Medication: Oxycodone IR Pill/Patch Count:  6 of 60 pills remain Pill/Patch Appearance: Markings consistent with prescribed medication Bottle Appearance: Standard pharmacy container. Clearly labeled. Filled Date: 23 / 14 / 2024 Last Medication intake:  TodaySafety precautions to be maintained throughout the outpatient stay will include: orient to surroundings, keep bed in low position, maintain call bell within reach at all times, provide assistance with transfer out of bed and ambulation.

## 2022-12-09 ENCOUNTER — Telehealth: Payer: Self-pay | Admitting: Family Medicine

## 2022-12-09 NOTE — Telephone Encounter (Signed)
Called LVM 12/09/18 24 to schedule Annual Wellness Visit  Verlee Rossetti; Care Guide Ambulatory Clinical Support [place] [facility] Direct Dial: [phone]

## 2022-12-19 ENCOUNTER — Other Ambulatory Visit: Payer: Self-pay | Admitting: Family Medicine

## 2022-12-19 DIAGNOSIS — J432 Centrilobular emphysema: Secondary | ICD-10-CM

## 2022-12-20 NOTE — Telephone Encounter (Signed)
Requested Prescriptions  Pending Prescriptions Disp Refills   SPIRIVA RESPIMAT 2.5 MCG/ACT AERS [Pharmacy Med Name: SPIRIVA RESPIMAT 2.5 MCG/ACT INH AE] 4 g 2    Sig: INHALE 2 PUFFS BY MOUTH INTO THE LUNGS DAILY     Pulmonology:  Anticholinergic Agents Passed - 12/19/2022  2:09 PM      Passed - Valid encounter within last 12 months    Recent Outpatient Visits           5 months ago Chronic pain syndrome   [place] [facility] Smitty Cords, DO   1 year ago Tinea pedis of right foot   [place] [facility] Smitty Cords, DO   1 year ago Benign hypertension with CKD (chronic kidney disease) stage III [facility])   [place] [facility] Smitty Cords, DO   1 year ago Chronic right-sided low back pain with bilateral sciatica   [place] [facility] Smitty Cords, DO   2 years ago Acute intractable headache, unspecified headache type   [place] [facility], Salvadore Oxford, NP       Future Appointments             In 1 week Althea Charon, Netta Neat, DO  [facility]   In 3 months Stoioff, Verna Czech, MD [facility] [place]

## 2022-12-27 ENCOUNTER — Ambulatory Visit: Payer: 59 | Admitting: Family Medicine

## 2022-12-30 ENCOUNTER — Other Ambulatory Visit: Payer: Self-pay | Admitting: Family Medicine

## 2022-12-30 DIAGNOSIS — F5105 Insomnia due to other mental disorder: Secondary | ICD-10-CM

## 2022-12-30 DIAGNOSIS — F419 Anxiety disorder, unspecified: Secondary | ICD-10-CM

## 2022-12-31 ENCOUNTER — Encounter: Payer: Self-pay | Admitting: Family Medicine

## 2022-12-31 ENCOUNTER — Ambulatory Visit (INDEPENDENT_AMBULATORY_CARE_PROVIDER_SITE_OTHER): Payer: 59 | Admitting: Family Medicine

## 2022-12-31 ENCOUNTER — Other Ambulatory Visit: Payer: Self-pay | Admitting: Family Medicine

## 2022-12-31 VITALS — BP 100/60 | Ht 71.0 in | Wt 200.0 lb

## 2022-12-31 DIAGNOSIS — E538 Deficiency of other specified B group vitamins: Secondary | ICD-10-CM

## 2022-12-31 DIAGNOSIS — N401 Enlarged prostate with lower urinary tract symptoms: Secondary | ICD-10-CM | POA: Diagnosis not present

## 2022-12-31 DIAGNOSIS — Z23 Encounter for immunization: Secondary | ICD-10-CM

## 2022-12-31 DIAGNOSIS — I129 Hypertensive chronic kidney disease with stage 1 through stage 4 chronic kidney disease, or unspecified chronic kidney disease: Secondary | ICD-10-CM

## 2022-12-31 DIAGNOSIS — N183 Chronic kidney disease, stage 3 unspecified: Secondary | ICD-10-CM | POA: Diagnosis not present

## 2022-12-31 DIAGNOSIS — I25118 Atherosclerotic heart disease of native coronary artery with other forms of angina pectoris: Secondary | ICD-10-CM | POA: Diagnosis not present

## 2022-12-31 DIAGNOSIS — R972 Elevated prostate specific antigen [PSA]: Secondary | ICD-10-CM

## 2022-12-31 DIAGNOSIS — N138 Other obstructive and reflux uropathy: Secondary | ICD-10-CM | POA: Diagnosis not present

## 2022-12-31 DIAGNOSIS — R7303 Prediabetes: Secondary | ICD-10-CM | POA: Diagnosis not present

## 2022-12-31 DIAGNOSIS — J432 Centrilobular emphysema: Secondary | ICD-10-CM

## 2022-12-31 DIAGNOSIS — E559 Vitamin D deficiency, unspecified: Secondary | ICD-10-CM

## 2022-12-31 DIAGNOSIS — Z0000 Encounter for general adult medical examination without abnormal findings: Secondary | ICD-10-CM

## 2022-12-31 DIAGNOSIS — E785 Hyperlipidemia, unspecified: Secondary | ICD-10-CM

## 2022-12-31 NOTE — Assessment & Plan Note (Signed)
Mostly controlled HTN Home BP reviewed Complication with orthostatic hypotension, CAD, AAA  Followed by [facility]   Plan:  1. Continue current BP regimen - Amlodipine 5mg , Metoprolol XL 50mg  daily, Losartan 25mg  daily 2. Encourage improved lifestyle - low sodium diet, regular exercise 3. Continue monitor BP outside office, bring readings to next visit, if persistently >140/90 or new symptoms notify office sooner

## 2022-12-31 NOTE — Telephone Encounter (Signed)
Requested medication (s) are due for refill today: yes  Requested medication (s) are on the active medication list: yes  Last refill:  06/26/22 #30/5  Future visit scheduled: yes today  Notes to clinic:  Unable to refill per protocol, cannot delegate.    Requested Prescriptions  Pending Prescriptions Disp Refills   LORazepam (ATIVAN) 0.5 MG tablet [Pharmacy Med Name: LORAZEPAM 0.5 MG TAB] 30 tablet     Sig: TAKE 1 TABLET BY MOUTH AT BEDTIME     Not Delegated - Psychiatry: Anxiolytics/Hypnotics 2 Failed - 12/30/2022  2:29 PM      Failed - This refill cannot be delegated      Failed - Urine Drug Screen completed in last 360 days      Failed - Valid encounter within last 6 months    Recent Outpatient Visits           6 months ago Chronic pain syndrome   [place] [facility] Smitty Cords, DO   1 year ago Tinea pedis of right foot   [place] [facility] Smitty Cords, DO   1 year ago Benign hypertension with CKD (chronic kidney disease) stage III [facility])   [place] [facility] Smitty Cords, DO   1 year ago Chronic right-sided low back pain with bilateral sciatica   [place] [facility] Smitty Cords, DO   2 years ago Acute intractable headache, unspecified headache type   [place] [facility], Salvadore Oxford, NP       Future Appointments             Today Althea Charon, Netta Neat, DO [facility]   In 2 months Lonna Cobb, Verna Czech, MD [facility] [place]            Passed - Patient is not pregnant

## 2022-12-31 NOTE — Addendum Note (Signed)
Addended by: Smitty Cords on: 12/31/2022 10:48 PM   Modules accepted: Level of Service

## 2022-12-31 NOTE — Patient Instructions (Addendum)
Thank you for coming to the office today.  The spot on the back is not cancerous. It is a benign growth. It can be raised dry and irritated itchy.  The prostate has concern for cancer. But right now you are on active surveillance from Urology. They will check you again in February 2025 and repeat testing. If you want to pursue a biopsy, please discuss with your Urologist.  DUE for FASTING BLOOD WORK (no food or drink after midnight before the lab appointment, only water or coffee without cream/sugar on the morning of)  SCHEDULE "Lab Only" visit in the morning at the clinic for lab draw in 6 MONTHS   - Make sure Lab Only appointment is at about 1 week before your next appointment, so that results will be available  For Lab Results, once available within 2-3 days of blood draw, you can can log in to MyChart online to view your results and a brief explanation. Also, we can discuss results at next follow-up visit.   Please schedule a Follow-up Appointment to: Return for 6 month fasting lab > 1 week later Annual Physical (prefer any day, afternoon).  If you have any other questions or concerns, please feel free to call the office or send a message through MyChart. You may also schedule an earlier appointment if necessary.  Additionally, you may be receiving a survey about your experience at our office within a few days to 1 week by e-mail or mail. We value your feedback.  Saralyn Pilar, DO [facility], [place]

## 2022-12-31 NOTE — Progress Notes (Signed)
Subjective:    Patient ID: Charles Ewing, male    DOB: 1940-05-22, 82 y.o.   MRN: [id number]  Charles Ewing is a 82 y.o. male presenting on 12/31/2022 for Medical Management of Chronic Issues (Concerned that one of his medications is causing him to be dizzy) and Skin Problem (Spot on back itches)   HPI  Discussed the use of AI scribe software for clinical note transcription with the patient, who gave verbal consent to proceed.    Chronic Low Back Pain, with bilateral sciatica Describes chronic low back pain, with radiating pain into both legs with sciatica, seems to be fairly persistent. He has followed with [facility], and has seen Dr Sadie Haber, he has had an ESI injection 1.5 month without relief. - He admits difficulty with ambulation and has less functional, limited for ADL function. He has son that helps him now. Followed by Pain Management on medication therapy including opiate w Oxycodone. Previously w [place]   He is followed by [facility] Dr Laban Emperor and also Dr Myer Haff [facility] [place] [facility]   HTN CKD CAD Lower Extremity Edema Followed by [facility] 08/2022 last visit BP has been controlled History of heart disease CAD, on medication management   Pre-Diabetes A1c 5.9-5.8 range recently Meds: No longer on medication - Prior Metformin XR Currently on ARB Lifestyle: - Diet (Tries to improve diet) - Exercise (Limited - due to knee pain) - Fam history of DM Denies hypoglycemia, polyuria, visual changes, numbness or tingling.   Centrilobular Emphysema (COPD) Currently doing well. No new concern or recent flare up. He has maintenance therapy. Spiriva Active smoker. Not ready to quit   HYPERLIPIDEMIA: - Reports no concerns. Last lipid panel 06/2022, controlled  - Currently taking Simvastatin, tolerating well without side effects or myalgias   Chronic Anxiety Associated with PTSD Currently controlled on Lorazepam 0.5mg  nightly for insomnia  and anxiety Currently on Fluoxetone 20mg  TWICE A DAY   History of Skin Cancer nose s/p excision and healed now.  SK inflamed Has new spot on back, he has some itching from it  Elevated PSA Last lab 13.2 elevated PSA (09/23/22) Recent imaging MRI 10/24/22 - with area of concern for possible prostate cancer Last visit 11/2022 with Dr L[facility] They are doing active surveillance Next apt 03/2023  Health Maintenance: Flu Shot today     12/31/2022    2:35 PM 05/27/2022   12:39 PM 03/26/2022    2:20 PM  Depression screen PHQ 2/9  Decreased Interest 3 1 0  Down, Depressed, Hopeless 3 0 0  PHQ - 2 Score 6 1 0  Altered sleeping 3    Tired, decreased energy 1    Change in appetite 1    Feeling bad or failure about yourself  2    Trouble concentrating 3    Moving slowly or fidgety/restless 0    Suicidal thoughts 0    PHQ-9 Score 16         12/31/2022    2:35 PM 02/15/2021    9:53 AM 08/26/2018    1:46 PM 04/09/2018   11:18 AM  GAD 7 : Generalized Anxiety Score  Nervous, Anxious, on Edge 3 2 2 2   Control/stop worrying 3 1 3 2   Worry too much - different things 3 1 3 3   Trouble relaxing 3 1 3 3   Restless 3 1 3 3   Easily annoyed or irritable 3 1 0 3  Afraid - awful might happen 3  1 3 3   Total GAD 7 Score 21 8 17 19   Anxiety Difficulty  Not difficult at all Not difficult at all Not difficult at all    Social History   Tobacco Use   Smoking status: Every Day    Current packs/day: 1.00    Average packs/day: 1 pack/day for 70.0 years (70.0 ttl pk-yrs)    Types: Cigarettes   Smokeless tobacco: Never  Vaping Use   Vaping status: Never Used  Substance Use Topics   Alcohol use: No   Drug use: No    Review of Systems Per HPI unless specifically indicated above     Objective:    BP 100/60   Ht 5\' 11"  (1.803 m)   Wt 200 lb (90.7 kg)   BMI 27.89 kg/m   Wt Readings from Last 3 Encounters:  12/31/22 200 lb (90.7 kg)  11/25/22 205 lb (93 kg)  08/21/22 205 lb  (93 kg)    Physical Exam Vitals and nursing note reviewed.  Constitutional:      General: He is not in acute distress.    Appearance: Normal appearance. He is well-developed. He is not diaphoretic.     Comments: Well-appearing, comfortable, cooperative  HENT:     Head: Normocephalic and atraumatic.  Eyes:     General:        Right eye: No discharge.        Left eye: No discharge.     Conjunctiva/sclera: Conjunctivae normal.  Cardiovascular:     Rate and Rhythm: Normal rate.  Pulmonary:     Effort: Pulmonary effort is normal.  Skin:    General: Skin is warm and dry.     Findings: No erythema or rash.  Neurological:     Mental Status: He is alert and oriented to person, place, and time.  Psychiatric:        Mood and Affect: Mood normal.        Behavior: Behavior normal.        Thought Content: Thought content normal.     Comments: Well groomed, good eye contact, normal speech and thoughts     I have personally reviewed the radiology report from 11/01/22 on MRI Prostate.  CLINICAL DATA:  Elevated PSA level.  R97.20.   EXAM: MR PROSTATE WITHOUT AND WITH CONTRAST   TECHNIQUE: Multiplanar multisequence MRI images were obtained of the pelvis centered about the prostate. Pre and post contrast images were obtained.   CONTRAST:  9mL GADAVIST GADOBUTROL 1 MMOL/ML IV SOLN   COMPARISON:  None Available.   FINDINGS: Prostate: Encapsulated nodularity in the transition zone compatible with benign prostatic hypertrophy. Cystic degeneration of nodularity at the posterior base.   Hazy low T2 signal in the peripheral zone is nonfocal, likely postinflammatory, and is considered PI-RADS category 2. Low T1 signal favoring scarring at the left posterolateral apex as on image 26 of series 3 -2.   Region of interest # 1: Rads category 4 lesion of the right posterolateral posteromedial peripheral zone the apex with focally reduced T2 signal (image 59, series 9) corresponding to  reduced ADC map activity and focally restricted diffusion (image 20 of series 7 and 8). This measures 0.42 cc (1.2 by 0.4 by 0.8 cm).   Volume: 3D volumetric analysis: Prostate volume 91.8 cc (5.8 by 5.7 by 6.6 cm).   Transcapsular spread:  Absent   Seminal vesicle involvement: Absent   Neurovascular bundle involvement: Absent   Pelvic adenopathy: Absent   Bone metastasis: Absent  Other findings: No other significant findings.   IMPRESSION: 1. PI-RADS category 4 lesion of the right posterolateral posteromedial peripheral zone at the apex. Targeting data sent to [facility]ro[facility]. 2. Scarring at the left posterolateral apex. 3. Benign prostatic hypertrophy and prostatomegaly.     Electronically Signed   By: Gaylyn Rong M.D.   On: 11/04/2022 15:24  Results for orders placed or performed in visit on 09/23/22  PSA  Result Value Ref Range   Prostate Specific Ag, Serum 13.2 (H) 0.0 - 4.0 ng/mL      Assessment & Plan:   Problem List Items Addressed This Visit     Benign hypertension with CKD (chronic kidney disease) stage III (HCC)    Mostly controlled HTN Home BP reviewed Complication with orthostatic hypotension, CAD, AAA  Follow[facility] by [facility]   Plan:  1. Continue current BP regimen - Amlodipine 5mg , Metoprolol XL 50mg  daily, Losartan 25mg  daily 2. Encourage improved lifestyle - low sodium diet, regular exercise 3. Continue monitor BP outside office, bring readings to next visit, if persistently >140/90 or new symptoms notify office sooner      Benign prostatic hyperplasia with urinary obstruction   Centrilobular emphysema (HCC)   Coronary artery disease   Pre-diabetes   Other Visit Diagnoses     Elevated PSA, between 10 and less than 20 ng/ml    -  Primary   Needs flu shot       Relevant Orders   Flu Vaccine Trivalent High Dose (Fluad) (Completed)        Prostate Cancer Concern Elevated PSA (13.2 on 09/23/2022) and concerning area on MRI. Currently  under active surveillance with urologist, Dr. Lonna Cobb. Next follow-up in February 2025. Patient anxious about uncertainty of diagnosis. -Discussed the need for biopsy for definitive diagnosis. -Encouraged patient to discuss biopsy option with urologist during next visit.  Skin Lesion / Seborrheic Keratosis Patient concerned about a skin lesion on the back. Examined and found to be a benign, dry, irritated patch. -Reassured patient that the lesion is not cancerous. -Advised that dermatologist can freeze the lesion if it becomes bothersome.  Pre-Diabetes Patient previously on Metformin, but has discontinued the medication. Last blood panel in May showed slightly elevated sugar levels. -Encouraged patient to continue current management without Metformin as long as he feels well  Medication Refills Patient reported needing refills on some medications. -Refilled Lorazepam as requested by patient. -Advised patient to contact the office if additional refills are needed.  General Health Maintenance -Administer influenza vaccine today. -Schedule follow-up visit in six months (May 2025) for blood work and physical exam.        Orders Placed This Encounter  Procedures   Flu Vaccine Trivalent High Dose (Fluad)    No orders of the defined types were placed in this encounter.   Follow up plan: Return for 6 month fasting lab > 1 week later Annual Physical (prefer any day, afternoon).  Future labs ordered for 06/24/23   Saralyn Pilar, DO [facility] 12/31/2022, 2:40 PM

## 2023-01-07 IMAGING — CR DG HIP (WITH OR WITHOUT PELVIS) 2-3V*R*
1 series · 3 of 3 positions shown · non-contrast
Comparison: CT 09/17/2016.

CLINICAL DATA: Generalized lower back pain that extends down
posterior side of right hip for 1 year.

EXAM:
DG HIP (WITH OR WITHOUT PELVIS) 2-3V RIGHT

[Series 1: dg hip unilat w or w/o pelvis 2-3 views  · non-contrast · 0.14mm/px · 3 of 3 slices shown]
[im 1/3]
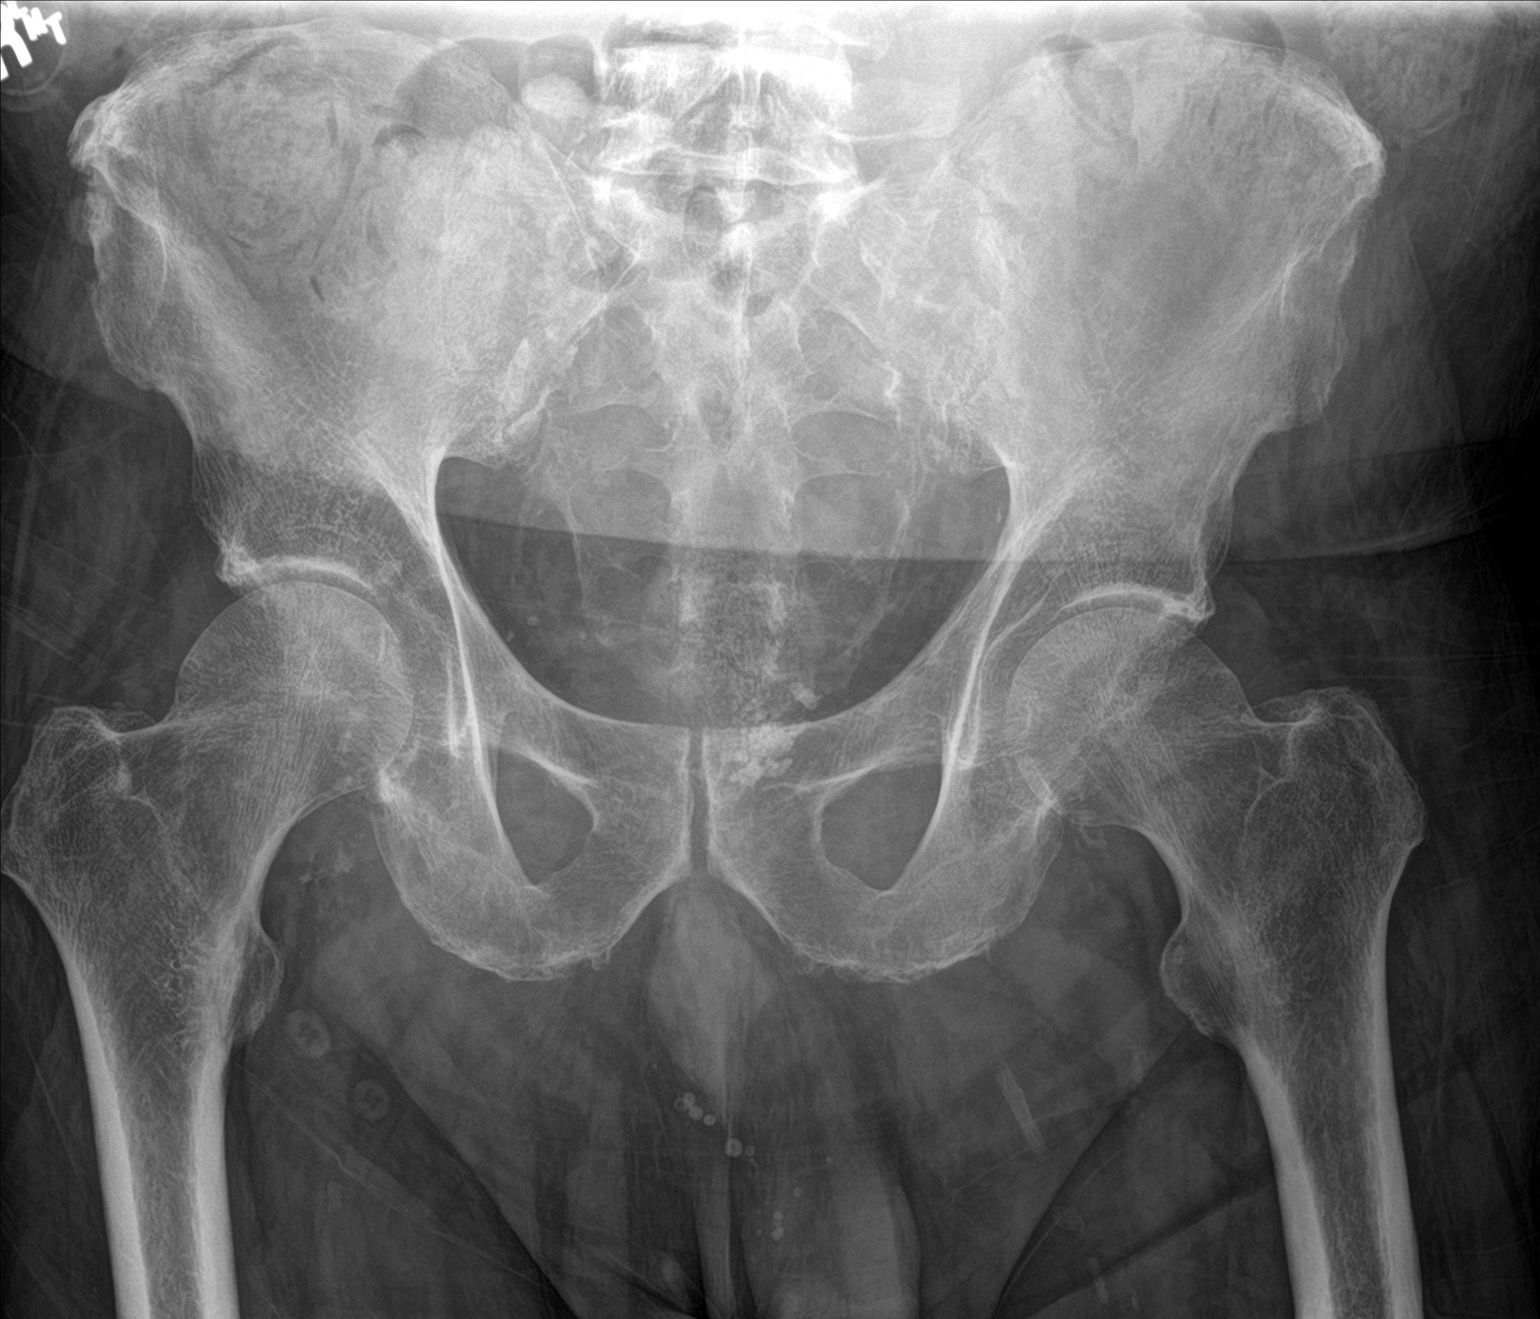
[im 2/3]
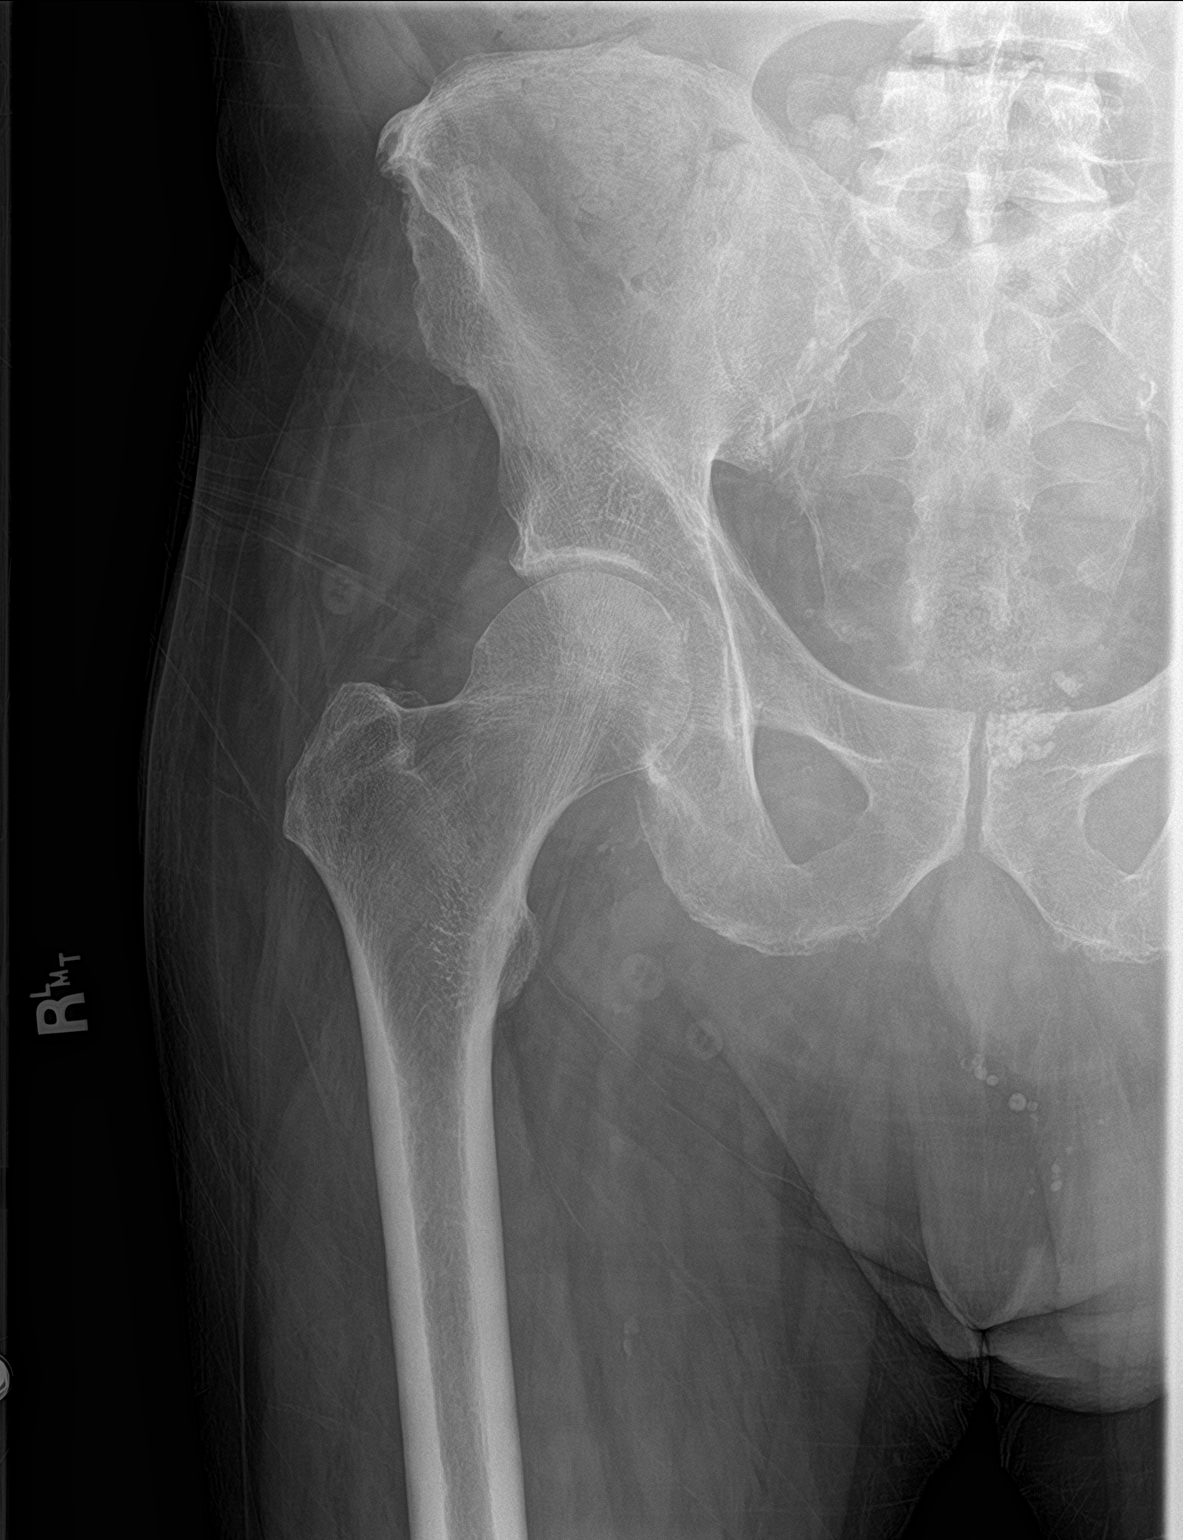
[im 3/3]
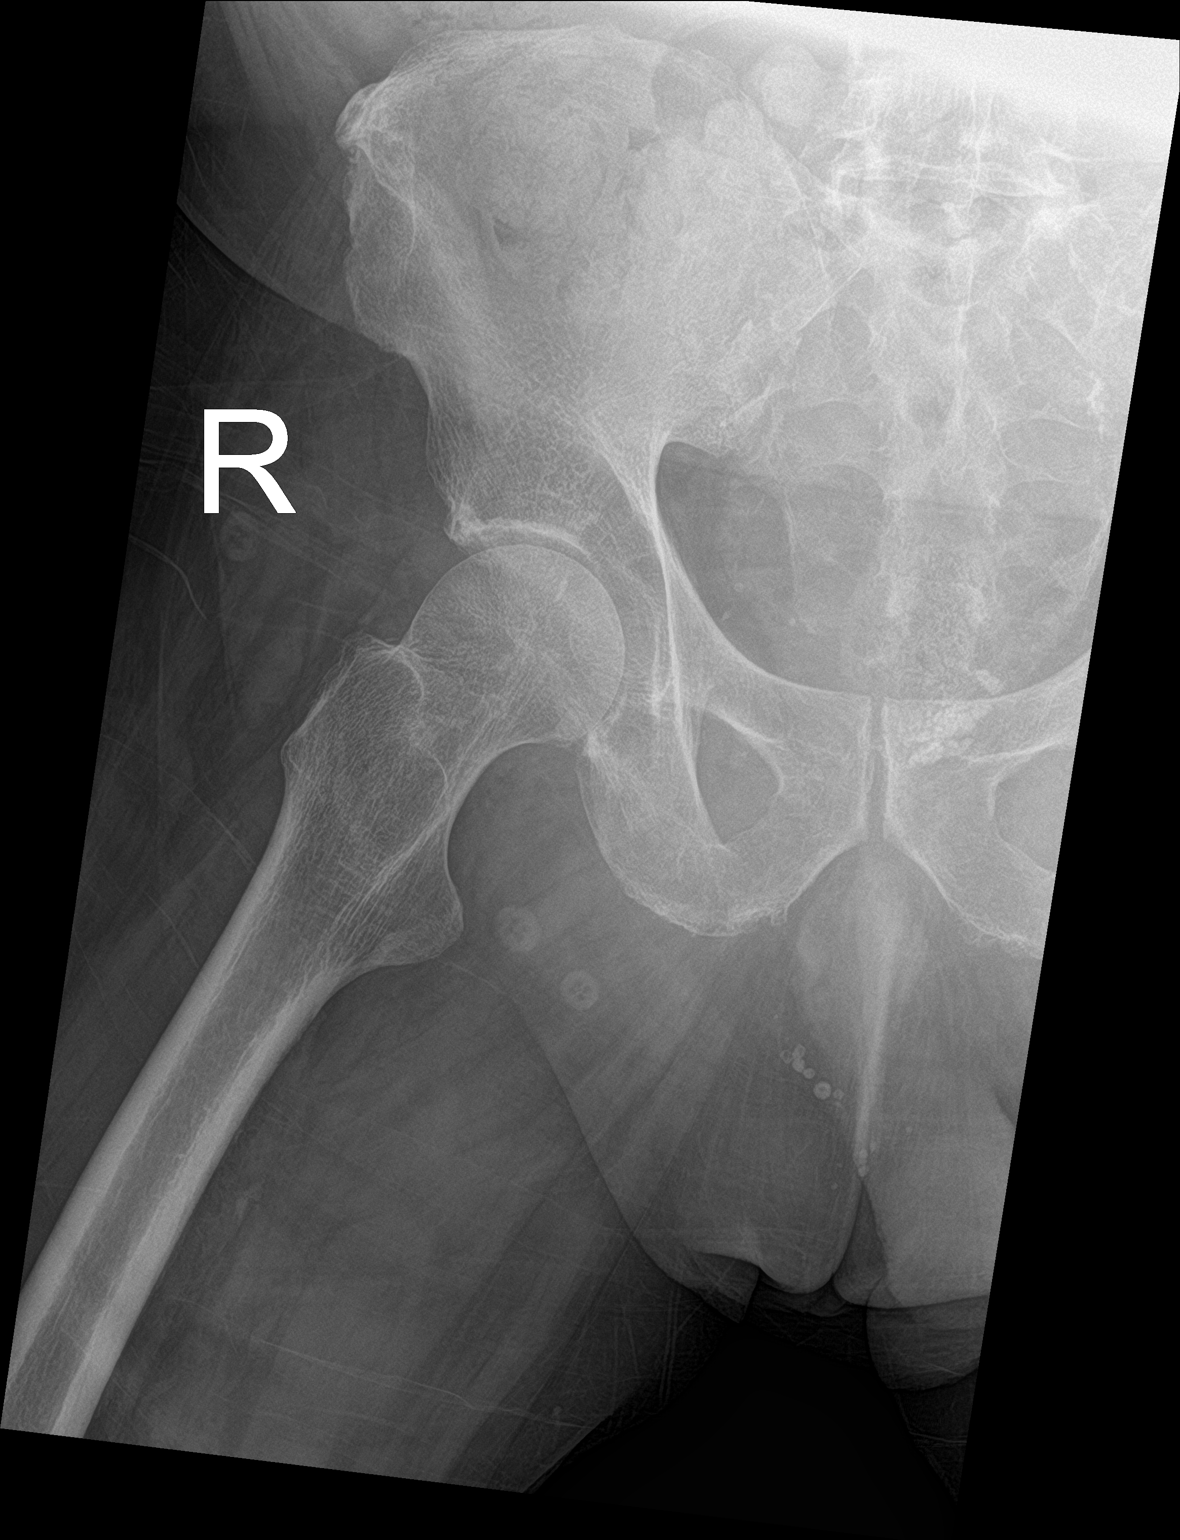

[3 of 3 positions shown; findings below may reference images not displayed]

FINDINGS: Diffuse osteopenia. Degenerative changes lumbar spine, both SI
joints, both hips. No acute bony or joint abnormality identified.
Pelvic and perineal calcifications consistent phlebolith prostate
calcification noted. Peripheral vascular calcification.
IMPRESSION: 1. Diffuse osteopenia. Diffuse degenerative change. No acute bony
abnormality.

2.  Peripheral vascular disease.

## 2023-01-13 ENCOUNTER — Other Ambulatory Visit: Payer: Self-pay | Admitting: Family Medicine

## 2023-01-13 DIAGNOSIS — R600 Localized edema: Secondary | ICD-10-CM

## 2023-01-13 DIAGNOSIS — N183 Chronic kidney disease, stage 3 unspecified: Secondary | ICD-10-CM

## 2023-01-16 NOTE — Telephone Encounter (Signed)
Requested Prescriptions  Pending Prescriptions Disp Refills   amLODipine (NORVASC) 5 MG tablet [Pharmacy Med Name: AMLODIPINE BESYLATE 5 MG TAB] 90 tablet 1    Sig: TAKE 1 TABLET BY MOUTH DAILY     Cardiovascular: Calcium Channel Blockers 2 Passed - 01/13/2023  9:34 AM      Passed - Last BP in normal range    BP Readings from Last 1 Encounters:  12/31/22 100/60         Passed - Last Heart Rate in normal range    Pulse Readings from Last 1 Encounters:  11/25/22 75         Passed - Valid encounter within last 6 months    Recent Outpatient Visits           2 weeks ago Elevated PSA, between 10 and less than 20 ng/ml   [facility] [place], Netta Neat, DO   6 months ago Chronic pain syndrome   [place] [facility] [place], Netta Neat, DO   1 year ago Tinea pedis of right foot   [place] [facility] [place], Netta Neat, DO   1 year ago Benign hypertension with CKD (chronic kidney disease) stage III [facility])   [place] [facility] [place], Netta Neat, DO   1 year ago Chronic right-sided low back pain with bilateral sciatica   [place] [facility] [place], Netta Neat, DO       Future Appointments             In 2 months Stoioff, Verna Czech, MD [facility] [place]   In 5 months Althea Charon, Netta Neat, DO [place] [facility]             furosemide (LASIX) 20 MG tablet [Pharmacy Med Name: FUROSEMIDE 20 MG TAB] 45 tablet     Sig: TAKE 1 TABLET BY MOUTH EVERY OTHER DAY     Cardiovascular:  Diuretics - Loop Failed - 01/13/2023  9:34 AM      Failed - K in normal range and within 180 days    Potassium  Date Value Ref Range Status  06/28/2022 4.9 3.5 - 5.3 mmol/L Final  10/16/2013 3.8 3.5 - 5.1 mmol/L Final         Failed - Ca in normal range and within 180 days    Calcium  Date Value Ref Range Status  06/28/2022 8.8  8.6 - 10.3 mg/dL Final   Calcium, Total  Date Value Ref Range Status  10/16/2013 8.0 (L) 8.5 - 10.1 mg/dL Final         Failed - Na in normal range and within 180 days    Sodium  Date Value Ref Range Status  06/28/2022 138 135 - 146 mmol/L Final  06/13/2021 138 134 - 144 mmol/L Final  10/16/2013 141 136 - 145 mmol/L Final         Failed - Cr in normal range and within 180 days    Creat  Date Value Ref Range Status  06/28/2022 1.18 0.70 - 1.22 mg/dL Final         Failed - Cl in normal range and within 180 days    Chloride  Date Value Ref Range Status  06/28/2022 104 98 - 110 mmol/L Final  10/16/2013 107 98 - 107 mmol/L Final         Failed - Mg Level in normal range and within 180  days    Magnesium  Date Value Ref Range Status  06/13/2021 2.3 1.6 - 2.3 mg/dL Final  29/51/8841 1.8 mg/dL Final    Comment:    6.6-0.6 THERAPEUTIC RANGE: 4-7 mg/dL TOXIC: > 10 mg/dL  -----------------------          Passed - Last BP in normal range    BP Readings from Last 1 Encounters:  12/31/22 100/60         Passed - Valid encounter within last 6 months    Recent Outpatient Visits           2 weeks ago Elevated PSA, between 10 and less than 20 ng/ml   [place] [facility] Smitty Cords, DO   6 months ago Chronic pain syndrome   [place] [facility] Smitty Cords, DO   1 year ago Tinea pedis of right foot   [place] [facility] Smitty Cords, DO   1 year ago Benign hypertension with CKD (chronic kidney disease) stage III [facility])   [place] [facility] Smitty Cords, DO   1 year ago Chronic right-sided low back pain with bilateral sciatica   [place] [facility] Smitty Cords, DO       Future Appointments             In 2 months Stoioff, Verna Czech, MD [facility] [place]   In 5 months Althea Charon, Netta Neat, DO [facility]

## 2023-01-16 NOTE — Telephone Encounter (Signed)
Requested medication (s) are due for refill today: yes  Requested medication (s) are on the active medication list: yes  Last refill:  06/26/22  #45 1 RF  Future visit scheduled: yes  Notes to clinic:  overdue lab work    Requested Prescriptions  Pending Prescriptions Disp Refills   furosemide (LASIX) 20 MG tablet [Pharmacy Med Name: FUROSEMIDE 20 MG TAB] 45 tablet     Sig: TAKE 1 TABLET BY MOUTH EVERY OTHER DAY     Cardiovascular:  Diuretics - Loop Failed - 01/13/2023  9:34 AM      Failed - K in normal range and within 180 days    Potassium  Date Value Ref Range Status  06/28/2022 4.9 3.5 - 5.3 mmol/L Final  10/16/2013 3.8 3.5 - 5.1 mmol/L Final         Failed - Ca in normal range and within 180 days    Calcium  Date Value Ref Range Status  06/28/2022 8.8 8.6 - 10.3 mg/dL Final   Calcium, Total  Date Value Ref Range Status  10/16/2013 8.0 (L) 8.5 - 10.1 mg/dL Final         Failed - Na in normal range and within 180 days    Sodium  Date Value Ref Range Status  06/28/2022 138 135 - 146 mmol/L Final  06/13/2021 138 134 - 144 mmol/L Final  10/16/2013 141 136 - 145 mmol/L Final         Failed - Cr in normal range and within 180 days    Creat  Date Value Ref Range Status  06/28/2022 1.18 0.70 - 1.22 mg/dL Final         Failed - Cl in normal range and within 180 days    Chloride  Date Value Ref Range Status  06/28/2022 104 98 - 110 mmol/L Final  10/16/2013 107 98 - 107 mmol/L Final         Failed - Mg Level in normal range and within 180 days    Magnesium  Date Value Ref Range Status  06/13/2021 2.3 1.6 - 2.3 mg/dL Final  40/98/1191 1.8 mg/dL Final    Comment:    4.7-8.2 THERAPEUTIC RANGE: 4-7 mg/dL TOXIC: > 10 mg/dL  -----------------------          Passed - Last BP in normal range    BP Readings from Last 1 Encounters:  12/31/22 100/60         Passed - Valid encounter within last 6 months    Recent Outpatient Visits           2 weeks ago  Elevated PSA, between 10 and less than 20 ng/ml   [facility] Smitty Cords, DO   6 months ago Chronic pain syndrome   [place] [facility] [facility] Smitty C[facility], DO   1 year ago Tinea pedis of right foot   [place] [facility] Smitty Cords, DO   1 year ago Benign hypertension with CKD (chronic kidney disease) stage III [facility])   [place] [facility] Smitty Cords, DO   1 year ago Chronic right-sided low back pain with bilateral sciatica   [place] [facility] Smitty Cords, DO       Future Appointments             In 2 months Stoioff, Verna Czech, MD [facility] [place]   In 5 months [place],  Netta Neat, DO [place] [facility]            Signed Prescriptions Disp Refills   amLODipine (NORVASC) 5 MG tablet 90 tablet 1    Sig: TAKE 1 TABLET BY MOUTH DAILY     Cardiovascular: Calcium Channel Blockers 2 Passed - 01/13/2023  9:34 AM      Passed - Last BP in normal range    BP Readings from Last 1 Encounters:  12/31/22 100/60         Passed - Last Heart Rate in normal range    Pulse Readings from Last 1 Encounters:  11/25/22 75         Passed - Valid encounter within last 6 months    Recent Outpatient Visits           2 weeks ago Elevated PSA, between 10 and less than 20 ng/ml   [facility] Smitty Cords, DO   6 months ago Chronic pain syndrome   [place] [facility] Smit[facility] Cords, DO   1 year ago Tinea pedis of right foot   [place] [facility] [place], Netta Neat, DO   1 year ago Benign hypertension with CKD (chronic kidney disease) stage III [facility])   [place] [facility] [place], Netta Neat, DO   1 year ago Chronic right-sided low back pain with bilateral sciatica    [place] [facility] Smitty Cords, DO       Future Appointments             In 2 months Stoioff, Verna Czech, MD [facility] [place]   In 5 months Althea Charon, Netta Neat, DO [facility], [facility]

## 2023-01-22 ENCOUNTER — Other Ambulatory Visit: Payer: Self-pay | Admitting: Family Medicine

## 2023-01-22 DIAGNOSIS — R350 Frequency of micturition: Secondary | ICD-10-CM

## 2023-01-22 DIAGNOSIS — R3911 Hesitancy of micturition: Secondary | ICD-10-CM

## 2023-01-22 DIAGNOSIS — R809 Proteinuria, unspecified: Secondary | ICD-10-CM

## 2023-01-22 DIAGNOSIS — N138 Other obstructive and reflux uropathy: Secondary | ICD-10-CM

## 2023-01-22 DIAGNOSIS — I10 Essential (primary) hypertension: Secondary | ICD-10-CM

## 2023-01-23 NOTE — Telephone Encounter (Signed)
Requested Prescriptions  Pending Prescriptions Disp Refills   losartan (COZAAR) 25 MG tablet [Pharmacy Med Name: LOSARTAN POTASSIUM 25 MG TAB] 90 tablet 1    Sig: TAKE 1 TABLET BY MOUTH DAILY     Cardiovascular:  Angiotensin Receptor Blockers Failed - 01/23/2023  8:57 AM      Failed - Cr in normal range and within 180 days    Creat  Date Value Ref Range Status  06/28/2022 1.18 0.70 - 1.22 mg/dL Final         Failed - K in normal range and within 180 days    Potassium  Date Value Ref Range Status  06/28/2022 4.9 3.5 - 5.3 mmol/L Final  10/16/2013 3.8 3.5 - 5.1 mmol/L Final         Passed - Patient is not pregnant      Passed - Last BP in normal range    BP Readings from Last 1 Encounters:  12/31/22 100/60         Passed - Valid encounter within last 6 months    Recent Outpatient Visits           3 weeks ago Elevated PSA, between 10 and less than 20 ng/ml   [place] [facility] Smitty Cords, DO   7 months ago Chronic pain syndrome   [place] [facility] Smitty Cords, DO   1 year ago Tinea pedis of right foot   [place] [facility] [place], Netta Neat, DO   1 year ago Benign hypertension with CKD (chronic kidney disease) stage III [facility])   [place] [facility] [place], Netta Neat, DO   1 year ago Chronic right-sided low back pain with bilateral sciatica   [place] [facility] Smitty Cords, DO       Future Appointments             In 2 months Stoioff, Verna Czech, MD [facility] [place]   In 5 months Althea Charon, Netta Neat, DO [place] [facility]             tamsulosin (FLOMAX) 0.4 MG CAPS capsule [Pharmacy Med Name: TAMSULOSIN HCL 0.4 MG CAP] 90 capsule 1    Sig: TAKE 1 CAPSULE BY MOUTH DAILY AFTER BREAKFAST     Urology: Alpha-Adrenergic Blocker Failed - 01/23/2023  8:57 AM      Failed -  PSA in normal range and within 360 days    PSA  Date Value Ref Range Status  06/28/2022 11.48 (H) < OR = 4.00 ng/mL Final    Comment:    The total PSA value from this assay system is  standardized against the WHO standard. The test  result will be approximately 20% lower when compared  to the equimolar-standardized total PSA (Beckman  Coulter). Comparison of serial PSA results should be  interpreted with this fact in mind. . This test was performed using the Siemens  chemiluminescent method. Values obtained from  different assay methods cannot be used interchangeably. PSA levels, regardless of value, should not be interpreted as absolute evidence of the presence or absence of disease.    Prostate Specific Ag, Serum  Date Value Ref Range Status  09/23/2022 13.2 (H) 0.0 - 4.0 ng/mL Final    Comment:    Roche ECLIA methodology. According to the American Urological Association, Serum PSA should decrease and remain at undetectable levels after radical prostatectomy. The  AUA defines biochemical recurrence as an initial PSA value 0.2 ng/mL or greater followed by a subsequent confirmatory PSA value 0.2 ng/mL or greater. Values obtained with different assay methods or kits cannot be used interchangeably. Results cannot be interpreted as absolute evidence of the presence or absence of malignant disease.          Passed - Last BP in normal range    BP Readings from Last 1 Encounters:  12/31/22 100/60         Passed - Valid encounter within last 12 months    Recent Outpatient Visits           3 weeks ago Elevated PSA, between 10 and less than 20 ng/ml   [place] [facility] Smitty Cords, DO   7 months ago Chronic pain syndrome   [place] [facility] Smitty Cords, DO   1 year ago Tinea pedis of right foot   [place] [facility] Smitty Cords, DO   1 year ago Benign hypertension with CKD  (chronic kidney disease) stage III [facility])   [place] [facility] Smitty Cords, DO   1 year ago Chronic right-sided low back pain with bilateral sciatica   [place] [facility] Smitty Cords, DO       Future Appointments             In 2 months Stoioff, Verna Czech, MD [facility] [place]   In 5 months Althea Charon, Netta Neat, DO [place] [facility]

## 2023-02-10 ENCOUNTER — Other Ambulatory Visit: Payer: Self-pay | Admitting: Family Medicine

## 2023-02-10 DIAGNOSIS — N183 Chronic kidney disease, stage 3 unspecified: Secondary | ICD-10-CM

## 2023-02-14 NOTE — Telephone Encounter (Signed)
 Requested Prescriptions  Pending Prescriptions Disp Refills   potassium chloride  (MICRO-K ) 10 MEQ CR capsule [Pharmacy Med Name: POTASSIUM CHLORIDE  ER 10 MEQ CAP] 45 capsule 1    Sig: TAKE 1 CAPSULE BY MOUTH EVERY OTHER DAY     Endocrinology:  Minerals - Potassium Supplementation Passed - 02/14/2023  3:16 PM      Passed - K in normal range and within 360 days    Potassium  Date Value Ref Range Status  06/28/2022 4.9 3.5 - 5.3 mmol/L Final  10/16/2013 3.8 3.5 - 5.1 mmol/L Final         Passed - Cr in normal range and within 360 days    Creat  Date Value Ref Range Status  06/28/2022 1.18 0.70 - 1.22 mg/dL Final         Passed - Valid encounter within last 12 months    Recent Outpatient Visits           1 month ago Elevated PSA, between 10 and less than 20 ng/ml   [place] [facility] Ed[place] Marsa PARAS, DO   7 months ago Chronic pain syndrome   [place] [facility] Edman Marsa PARAS, DO   1 year ago Tinea pedis of right foot   [place] [facility] Edman Marsa PARAS, DO   1 year ago Benign hypertension with CKD (chronic kidney disease) stage III [facility])   [place] [facility] Edman Marsa PARAS, DO   1 year ago Chronic right-sided low back pain with bilateral sciatica   [place] [facility] Edman Marsa PARAS, DO       Future Appointments             In 1 month Stoioff, Glendia BROCKS, MD [facility] [place]   In 4 months Edman, Marsa PARAS, DO [facility], [facility]

## 2023-02-18 NOTE — Progress Notes (Signed)
 PROVIDER NOTE: Information contained herein reflects review and annotations entered in association with encounter. Interpretation of such information and data should be left to medically-trained personnel. Information provided to patient can be located elsewhere in the medical record under Patient Instructions. Document created using STT-dictation technology, any transcriptional errors that may result from process are unintentional.    Patient: Charles Ewing  Service Category: E/M  Provider: Eric DELENA Como, MD  DOB: July 23, 1940  DOS: 02/19/2023  Referring Provider: Edman Blunt *  MRN: [id number]  Specialty: Interventional Pain Management  PCP: Edman Blunt PARAS, DO  Type: Established Patient  Setting: Ambulatory outpatient    Location: Office  Delivery: Face-to-face     HPI  [name], a 83 y.o. year old male, is here today because of his No primary diagnosis found.. [name] primary complain today is Back Pain (lower)  Pertinent problems: Mr. Skalicky has Osteoarthritis of knees (Bilateral); Bilateral lower extremity edema; Chronic pain syndrome; Chronic hip pain (2ry area of Pain) (Right); Chronic lower extremity pain (3ry area of Pain) (Right); Chronic low back pain (1ry area of Pain) (Bilateral) (R>L) w/o sciatica; Lumbar facet syndrome; Dextroscoliosis of lumbar spine; DDD (degenerative disc disease), lumbosacral; Osteopenia of lumbar spine; Osteopenia determined by x-ray; Osteoarthritis of sacroiliac joints (HCC) (Bilateral); Osteoarthritis of hips (Bilateral); Chronic sacroiliac joint pain (Bilateral); Sacroiliac joint dysfunction (Bilateral); Spondylosis without myelopathy or radiculopathy, lumbosacral region; Other spondylosis, sacral and sacrococcygeal region; Abnormal MRI, lumbar spine (03/11/2022); Chronic low back pain (Bilateral) w/ sciatica (Right); Herniated nucleus pulposus, L4-5 (Right); Lumbosacral radiculopathy at L4 (Right); L4-5 disc bulge (Right);  Spondylolisthesis at L4-L5 level; 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 Pain Assessment: Severity of Chronic pain is reported as a 6 /10. Location: Back Lower/right leg to the knee. Onset: More than a month ago. Quality: Shooting. Timing: Intermittent. Modifying factor(s): rest. Vitals:  height is 5' 11 (1.803 m) and weight is 190 lb (86.2 kg). His temporal temperature is 97.4 F (36.3 C) (abnormal). His blood pressure is 110/57 (abnormal). His respiration is 16 and oxygen saturation is 95%.  BMI: Estimated body mass index is 26.5 kg/m as calculated from the following:   Height as of this encounter: 5' 11 (1.803 m).   Weight as of this encounter: 190 lb (86.2 kg). Last encounter: 11/25/2022. Last procedure: 03/12/2022.  Reason for encounter: medication management.  The patient indicates doing well with the current medication regimen. No adverse reactions or side effects reported to the medications.  The patient comes in today indicating that rather than taking 2 oxycodones per day he has been taking 3/day.  This change was not sanctioned by me and constitutes noncompliance with his medication regimen.  In lieu of the fact that these are opioid analgesics and to have a significant potential for addiction, this type of on sanctioned escalations of dosage or not to be decided by the patient.  This is of special concern due to the fact that he is also taking a benzodiazepine (lorazepam ) on a regular basis, increasing the risk of drug to drug interaction that may lead to respiratory depression and death.  His last prescription for oxycodone  IR was filled on 01/25/2023 (25 days ago) according to his PMP.  This was in accordance to the prescriptions provided to him on 11/25/2022.  Since he had his prescription filled 25 days ago and he should be using 2 pills/day, his count for today  should have been 10 pills  remaining.  His actual count is 25 pills.  These should last him for another 12.5 days or until 03/03/2023.  This is when he will be able to fill his next prescription.  Discussed the use of AI scribe software for clinical note transcription with the patient, who gave verbal consent to proceed.  History of Present Illness   The patient, with a history of chronic pain managed with opioids, reports experiencing dizziness, which he attributes to his medication regimen. He also takes lorazepam , prescribed by his primary care physician, for several years, primarily to aid sleep. He takes one 0.5mg  pill at night. The patient acknowledges the potential risks of combining opioids and benzodiazepines and agrees to discontinue the lorazepam . He also admits to occasionally taking more than the prescribed dose of his pain medication when experiencing severe pain, such as after a recent back injury. The patient is aware of the risks associated with this behavior and agrees to adhere strictly to the prescribed dosage in the future. He denies any other adverse effects or concerns related to his current medication regimen.      RTCB: 06/01/2023    Pharmacotherapy Assessment  Analgesic: Oxycodone  IR 5 mg tablet, 1 tab p.o. 2 times daily (#60) MME/day: 15 mg/day   Monitoring: August PMP: PDMP reviewed during this encounter.       Pharmacotherapy: No side-effects or adverse reactions reported. Compliance: No problems identified. Effectiveness: Clinically acceptable.  Shela Reda CROME, RN  02/19/2023 12:37 PM  Sign when Signing Visit Nursing Pain Medication Assessment:  Safety precautions to be maintained throughout the outpatient stay will include: orient to surroundings, keep bed in low position, maintain call bell within reach at all times, provide assistance with transfer out of bed and ambulation.  Medication Inspection Compliance: Pill count conducted under aseptic conditions, in front of the patient. Neither the  pills nor the bottle was removed from the patient's sight at any time. Once count was completed pills were immediately returned to the patient in their original bottle.  Medication: Oxycodone  IR Pill/Patch Count:  25 of 60 pills remain Pill/Patch Appearance: Markings consistent with prescribed medication Bottle Appearance: Standard pharmacy container. Clearly labeled. Filled Date: 59 / 14 / 2024 Last Medication intake:  Today    No results found for: CBDTHCR No results found for: D8THCCBX No results found for: D9THCCBX  UDS:  Summary  Date Value Ref Range Status  08/21/2022 Note  Final    Comment:    ==================================================================== [employer] Select 13 (MW) ==================================================================== Test                             Result       Flag       Units  Drug Present and Declared for Prescription Verification   Lorazepam                       119          EXPECTED   ng/mg creat    Source of lorazepam  is a scheduled prescription medication.    Oxycodone                       573          EXPECTED   ng/mg creat   Oxymorphone  77           EXPECTED   ng/mg creat   Noroxycodone                   1127         EXPECTED   ng/mg creat    Sources of oxycodone  include scheduled prescription medications.    Oxymorphone and noroxycodone are expected metabolites of oxycodone .    Oxymorphone is also available as a scheduled prescription medication.  ==================================================================== Test                      Result    Flag   Units      Ref Range   Creatinine              192              mg/dL      >=79 ==================================================================== Declared Medications:  The flagging and interpretation on this report are based on the  following declared medications.  Unexpected results may arise from  inaccuracies in the declared  medications.   **Note: The testing scope of this panel includes these medications:   Lorazepam  (Ativan )  Oxycodone    **Note: The testing scope of this panel does not include the  following reported medications:   Albuterol   Amlodipine   Aspirin   Fluoxetine  (Prozac )  Fluticasone  (Flonase )  Furosemide  (Lasix )  Losartan   Meloxicam   Naloxone  (Narcan )  Pantoprazole  (Protonix )  Potassium Chloride   Simvastatin  (Zocor )  Tamsulosin  (Flomax )  Tiotropium (Spiriva )  Vitamin B12 ==================================================================== For clinical consultation, please call [phone]. ====================================================================       ROS  Constitutional: Denies any fever or chills Gastrointestinal: No reported hemesis, hematochezia, vomiting, or acute GI distress Musculoskeletal: Denies any acute onset joint swelling, redness, loss of ROM, or weakness Neurological: No reported episodes of acute onset apraxia, aphasia, dysarthria, agnosia, amnesia, paralysis, loss of coordination, or loss of consciousness  Medication Review  FLUoxetine , LORazepam , Tiotropium Bromide  Monohydrate, albuterol , amLODipine , aspirin  EC, cyanocobalamin , fluticasone , furosemide , losartan , meloxicam , naloxone , oxyCODONE , pantoprazole , potassium chloride , simvastatin , and tamsulosin   History Review  Allergy: Mr. Martinezlopez is allergic to nexium [esomeprazole magnesium]. Drug: Mr. Brockbank  reports no history of drug use. Alcohol:  reports no history of alcohol use. Tobacco:  reports that he has been smoking cigarettes. He has a 70 pack-year smoking history. He has never used smokeless tobacco. Social: Mr. Sellman  reports that he has been smoking cigarettes. He has a 70 pack-year smoking history. He has never used smokeless tobacco. He reports that he does not drink alcohol and does not use drugs. Medical:  has a past medical history of Abdominal aortic aneurysm (AAA) (HCC) (2013),  Anxiety, Arrhythmia, Arthritis, Colon polyp, COPD (chronic obstructive pulmonary disease) (HCC), Coronary artery disease, Depression, GERD (gastroesophageal reflux disease), Hyperlipidemia, Hypertension, Skin cancer, and Thrush. Surgical: Mr. Howry  has a past surgical history that includes Skin cancer excision; Colon polyp surgery; Transurethral resection of prostate (2010); Cataract extraction w/PHACO (Left, 05/09/2020); and Cataract extraction w/PHACO (Right, 05/23/2020). Family: family history includes Kidney cancer in his mother.  Laboratory Chemistry Profile   Renal Lab Results  Component Value Date   BUN 18 06/28/2022   CREATININE 1.18 06/28/2022   BCR SEE NOTE: 06/28/2022   GFRAA 64 01/11/2019   GFRNONAA >60 12/23/2020    Hepatic Lab Results  Component Value Date   AST 13 06/28/2022   ALT 8 (L) 06/28/2022   ALBUMIN 3.9 06/13/2021   ALKPHOS  70 06/13/2021    Electrolytes Lab Results  Component Value Date   NA 138 06/28/2022   K 4.9 06/28/2022   CL 104 06/28/2022   CALCIUM  8.8 06/28/2022   MG 2.3 06/13/2021    Bone Lab Results  Component Value Date   VD25OH 39 06/28/2022   25OHVITD1 14 (L) 06/13/2021   25OHVITD2 1.8 06/13/2021   25OHVITD3 12 06/13/2021    Inflammation (CRP: Acute Phase) (ESR: Chronic Phase) Lab Results  Component Value Date   CRP 1 06/13/2021   ESRSEDRATE 20 06/13/2021         Note: Above Lab results reviewed.  Recent Imaging Review  MR PROSTATE W WO CONTRAST CLINICAL DATA:  Elevated PSA level.  R97.20.  EXAM: MR PROSTATE WITHOUT AND WITH CONTRAST  TECHNIQUE: Multiplanar multisequence MRI images were obtained of the pelvis centered about the prostate. Pre and post contrast images were obtained.  CONTRAST:  9mL GADAVIST  GADOBUTROL  1 MMOL/ML IV SOLN  COMPARISON:  None Available.  FINDINGS: Prostate: Encapsulated nodularity in the transition zone compatible with benign prostatic hypertrophy. Cystic degeneration of nodularity at the  posterior base.  Hazy low T2 signal in the peripheral zone is nonfocal, likely postinflammatory, and is considered PI-RADS category 2. Low T1 signal favoring scarring at the left posterolateral apex as on image 26 of series 3 -2.  Region of interest # 1: Rads category 4 lesion of the right posterolateral posteromedial peripheral zone the apex with focally reduced T2 signal (image 59, series 9) corresponding to reduced ADC map activity and focally restricted diffusion (image 20 of series 7 and 8). This measures 0.42 cc (1.2 by 0.4 by 0.8 cm).  Volume: 3D volumetric analysis: Prostate volume 91.8 cc (5.8 by 5.7 by 6.6 cm).  Transcapsular spread:  Absent  Seminal vesicle involvement: Absent  Neurovascular bundle involvement: Absent  Pelvic adenopathy: Absent  Bone metastasis: Absent  Other findings: No other significant findings.  IMPRESSION: 1. PI-RADS category 4 lesion of the right posterolateral posteromedial peripheral zone at the apex. Targeting data sent to [facility]. 2. Scarring at the left posterolateral apex. 3. Benign prostatic hypertrophy and prostatomegaly.  Electronically Signed   By: Ryan Salvage M.D.   On: 11/04/2022 15:24 Note: Reviewed        Physical Exam  General appearance: Well nourished, well developed, and well hydrated. In no apparent acute distress Mental status: Alert, oriented x 3 (person, place, & time)       Respiratory: No evidence of acute respiratory distress Eyes: PERLA Vitals: BP (!) 110/57   Temp (!) 97.4 F (36.3 C) (Temporal)   Resp 16   Ht 5' 11 (1.803 m)   Wt 190 lb (86.2 kg)   SpO2 95%   BMI 26.50 kg/m  BMI: Estimated body mass index is 26.5 kg/m as calculated from the following:   Height as of this encounter: 5' 11 (1.803 m).   Weight as of this encounter: 190 lb (86.2 kg). Ideal: Ideal body weight: 75.3 kg (166 lb 0.1 oz) Adjusted ideal body weight: 79.7 kg (175 lb 9.7 oz)  Assessment   Diagnosis Status  1.  Chronic pain syndrome   2. Pharmacologic therapy   3. Osteoarthritis of hips (Bilateral)   4. Encounter for medication management   5. Chronic hip pain (2ry area of Pain) (Right)   6. Encounter for chronic pain management   7. Chronic lower extremity pain (3ry area of Pain) (Right)   8. Chronic use of opiate for therapeutic purpose  9. Lumbar facet syndrome (Right)   10. 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 Dextroscoliosis of lumbar spine    Controlled Controlled Controlled   Updated Problems: No problems updated.  Plan of Care  Problem-specific:  Assessment and Plan    Chronic Pain Management   Currently on opioid therapy for chronic pain, he is also using lorazepam , which significantly increases the risk of respiratory depression and fatal outcomes. After discussing the CDC warning about this drug interaction, he agreed to discontinue lorazepam  to mitigate risk. He has been informed about potential withdrawal symptoms and alternative sleep aids like melatonin, and the importance of proper sleep hygiene was emphasized. He will discuss tapering off lorazepam  with his primary care physician, reduce the lorazepam  dose from 0.5 mg to 0.25 mg, then discontinue it after one week, consider alternative sleep aids such as melatonin, and was educated on proper sleep hygiene, including avoiding daytime naps and caffeine before bedtime.  Medication Compliance   He has been taking his pain medication as prescribed but occasionally exceeds the dose due to severe pain episodes. The risks of non-compliance, including adverse effects and potential overdose, were reinforced. He was advised to take medication exactly as prescribed and educated on the risks of taking additional doses. He was also advised to use alternative pain relief methods such as aspirin , acetaminophen , or ibuprofen for breakthrough  pain.  Dizziness   He reports experiencing dizziness, which could be due to his current medications. The possibility of other causes such as cardiac arrhythmias or hypotension was discussed, and he was advised to discuss this with his primary care physician for further evaluation. He will monitor for signs of hypotension or cardiac abnormalities.  General Health Maintenance   He was advised on general health maintenance practices to improve overall well-being and prevent complications. His pharmacy records were confirmed, and his next prescription refill was scheduled for March 03, 2023.       Mr. DONALDO TEEGARDEN has a current medication list which includes the following long-term medication(s): albuterol , amlodipine , fluoxetine , fluticasone , furosemide , losartan , potassium chloride , simvastatin , spiriva  respimat, [START ON 03/03/2023] oxycodone , [START ON 04/02/2023] oxycodone , and [START ON 05/02/2023] oxycodone .  Pharmacotherapy (Medications Ordered): Meds ordered this encounter  Medications   oxyCODONE  (OXY IR/ROXICODONE ) 5 MG immediate release tablet    Sig: Take 1 tablet (5 mg total) by mouth 2 (two) times daily as needed for severe pain (pain score 7-10). Each refill must last 30 days.    Dispense:  60 tablet    Refill:  0    DO NOT: delete (not duplicate); no partial-fill (will deny script to complete), no refill request (F/U required). DISPENSE: 1 day early if closed on fill date. WARN: No CNS-depressants within 8 hrs of med.   oxyCODONE  (OXY IR/ROXICODONE ) 5 MG immediate release tablet    Sig: Take 1 tablet (5 mg total) by mouth 2 (two) times daily as needed for severe pain (pain score 7-10). Each refill must last 30 days.    Dispense:  60 tablet    Refill:  0    DO NOT: delete (not duplicate); no partial-fill (will deny script to complete), no refill request (F/U required). DISPENSE: 1 day early if closed on fill date. WARN: No CNS-depressants within 8 hrs of med.   oxyCODONE  (OXY  IR/ROXICODONE ) 5 MG immediate release tablet    Sig: Take 1 tablet (5 mg total)  by mouth 2 (two) times daily as needed for severe pain (pain score 7-10). Each refill must last 30 days.    Dispense:  60 tablet    Refill:  0    DO NOT: delete (not duplicate); no partial-fill (will deny script to complete), no refill request (F/U required). DISPENSE: 1 day early if closed on fill date. WARN: No CNS-depressants within 8 hrs of med.   Orders:  No orders of the defined types were placed in this encounter.  Follow-up plan:   Return in about 3 months (around 06/01/2023) for Eval-day (M,W), (F2F), (MM).      Interventional Therapies  Risk Factors  Considerations:   Abdominal aortic aneurysm (AAA)  HTN  Cardiomyopathy  CAD  orthostatic hypotension  Irregular heart rhythm CKD  SOB  Tobacco abuse  COPD  GERD   Planned  Pending:      Under consideration:   Diagnostic/therapeutic right L4-5 LESI #2  Diagnostic/therapeutic bilateral IA hip joint inj. #1    Completed:   Diagnostic/therapeutic right L4-5 LESI x1 (03/12/2022)  Therapeutic right lumbar facet RFA x1 (10/18/2021) (100/100/50/50)  Diagnostic right lumbar facet MBB x2 (09/13/2021) (100/100/50/50)    Completed by other providers:   Diagnostic/therapeutic bilateral IA steroid knee inj. (03/15/2016) by Darold Molt, PA [place])    Therapeutic  Palliative (PRN) options:   Therapeutic right lumbar facet MBB       Recent Visits Date Type Provider Dept  11/25/22 Office Visit Tanya Glisson, MD [facility]  Showing recent visits within past 90 days and meeting all other requirements Today's Visits Date Type Provider Dept  02/19/23 Office Visit Tanya Glisson, MD [facility]  Showing today's visits and meeting all other requirements Future Appointments No visits were found meeting these conditions. Showing future appointments within next 90 days and meeting all other requirements  I discussed  the assessment and treatment plan with the patient. The patient was provided an opportunity to ask questions and all were answered. The patient agreed with the plan and demonstrated an understanding of the instructions.  Patient advised to call back or seek an in-person evaluation if the symptoms or condition worsens.  Duration of encounter: 30 minutes.  Total time on encounter, as per AMA guidelines included both the face-to-face and non-face-to-face time personally spent by the physician and/or other qualified health care professional(s) on the day of the encounter (includes time in activities that require the physician or other qualified health care professional and does not include time in activities normally performed by clinical staff). Physician's time may include the following activities when performed: Preparing to see the patient (e.g., pre-charting review of records, searching for previously ordered imaging, lab work, and nerve conduction tests) Review of prior analgesic pharmacotherapies. Reviewing PMP Interpreting ordered tests (e.g., lab work, imaging, nerve conduction tests) Performing post-procedure evaluations, including interpretation of diagnostic procedures Obtaining and/or reviewing separately obtained history Performing a medically appropriate examination and/or evaluation Counseling and educating the patient/family/caregiver Ordering medications, tests, or procedures Referring and communicating with other health care professionals (when not separately reported) Documenting clinical information in the electronic or other health record Independently interpreting results (not separately reported) and communicating results to the patient/ family/caregiver Care coordination (not separately reported)  Note by: Glisson DELENA Tanya, MD Date: 02/19/2023; Time: 3:26 PM

## 2023-02-18 NOTE — Patient Instructions (Addendum)
 ______________________________________________________________________    Drug Holidays  What is a "Drug Holiday"? Drug Holiday: is the name given to the process of slowly tapering down and temporarily stopping the pain medication for the purpose of decreasing or eliminating tolerance to the drug.  Benefits Improved effectiveness Decreased required effective dose Improved pain control End dependence on high dose therapy Decrease cost of therapy Uncovering "opioid-induced hyperalgesia". (OIH)  What is "opioid hyperalgesia"? It is a paradoxical increase in pain caused by exposure to opioids. Stopping the opioid pain medication, contrary to the expected, it actually decreases or completely eliminates the pain. Ref.: "A comprehensive review of opioid-induced hyperalgesia". Donney Rankins, et.al. Pain Physician. 2011 Mar-Apr;14(2):145-61.  What is tolerance? Tolerance: the progressive loss of effectiveness of a pain medicine due to repetitive use. A common problem of opioid pain medications.  How long should a "Drug Holiday" last? Effectiveness depends on the patient staying off all opioid pain medicines for a minimum of 14 consecutive days. (2 weeks) During this time the patient should not be taking any other opioid analgesic medication.  How about just taking less of the medicine? Does not work. Will not accomplish goal of eliminating the excess receptors.  How about switching to a different pain medicine? (AKA. "Opioid rotation") Does not work. Creates the illusion of effectiveness by taking advantage of inaccurate equivalent dose calculations between different opioids. -This "technique" was promoted by studies funded by [employer], such as [employer], creators of "OxyContin".  Can I stop the medicine "cold [place]"? We do not recommend it. You should always coordinate with your prescribing physician to make the transition as smoothly as possible. Avoid stopping the  medicine abruptly without consulting. We recommend a "slow taper".  What is a slow taper? Taper: refers to the gradual decrease in dose.   How do I stop/taper the dose? Slowly. Decrease the daily amount of pills that you take by one (1) pill every seven (7) days. This is called a "slow downward taper". Example: if you normally take four (4) pills per day, drop it to three (3) pills per day for seven (7) days, then to two (2) pills per day for seven (7) days, then to one (1) per day for seven (7) days, and then stop the medicine. The 14 day "Drug Holiday" starts on the first day without medicine.   Will I experience withdrawals? Unlikely with a slow taper.  What triggers withdrawals? Withdrawals are triggered by the sudden/abrupt stop of high dose opioids. Withdrawals can be avoided by slowly decreasing the dose over a prolonged period of time.  What are withdrawals? Symptoms associated with sudden/abrupt reduction/stopping of high-dose, long-term use of pain medication. Withdrawal are seldom seen on low dose therapy, or patients rarely taking opioid medication.  Early Withdrawal Symptoms may include: Agitation Anxiety Muscle aches Increased tearing Insomnia Runny nose Sweating Yawning  Late symptoms may include: Abdominal cramping Diarrhea Dilated pupils Goose bumps Nausea Vomiting  When could I see withdrawals? Onset: 8-24 hours after last use for most opioids. 12-48 hours for long-acting opioids (i.e.: methadone)  How long could they last? Duration: 4-10 days for most opioids. 14-21 days for long-acting opioids (i.e.: methadone)  What will happen after I complete my "Drug Holiday"? The need and indications for the opioid analgesic will be reviewed before restarting the medication. Dose requirements will likely decrease and the dose will need to be adjusted accordingly.   (Last update: 12/04/2022) ______________________________________________________________________       ______________________________________________________________________    Opioid Pain  Medication Update  To: All patients taking opioid pain medications. (I.e.: hydrocodone, hydromorphone, oxycodone, oxymorphone, morphine, codeine, methadone, tapentadol, tramadol, buprenorphine, fentanyl, etc.)  Re: Updated review of side effects and adverse reactions of opioid analgesics, as well as new information about long term effects of this class of medications.  Direct risks of long-term opioid therapy are not limited to opioid addiction and overdose. Potential medical risks include serious fractures, breathing problems during sleep, hyperalgesia, immunosuppression, chronic constipation, bowel obstruction, myocardial infarction, and tooth decay secondary to xerostomia.  Unpredictable adverse effects that can occur even if you take your medication correctly: Cognitive impairment, respiratory depression, and death. Most people think that if they take their medication "correctly", and "as instructed", that they will be safe. Nothing could be farther from the truth. In reality, a significant amount of recorded deaths associated with the use of opioids has occurred in individuals that had taken the medication for a long time, and were taking their medication correctly. The following are examples of how this can happen: Patient taking his/her medication for a long time, as instructed, without any side effects, is given a certain antibiotic or another unrelated medication, which in turn triggers a "Drug-to-drug interaction" leading to disorientation, cognitive impairment, impaired reflexes, respiratory depression or an untoward event leading to serious bodily harm or injury, including death.  Patient taking his/her medication for a long time, as instructed, without any side effects, develops an acute impairment of liver and/or kidney function. This will lead to a rapid inability of the body to breakdown and  eliminate their pain medication, which will result in effects similar to an "overdose", but with the same medicine and dose that they had always taken. This again may lead to disorientation, cognitive impairment, impaired reflexes, respiratory depression or an untoward event leading to serious bodily harm or injury, including death.  A similar problem will occur with patients as they grow older and their liver and kidney function begins to decrease as part of the aging process.  Background information: Historically, the original case for using long-term opioid therapy to treat chronic noncancer pain was based on safety assumptions that subsequent experience has called into question. In 1996, the American Pain Society and the American Academy of Pain Medicine issued a consensus statement supporting long-term opioid therapy. This statement acknowledged the dangers of opioid prescribing but concluded that the risk for addiction was low; respiratory depression induced by opioids was short-lived, occurred mainly in opioid-naive patients, and was antagonized by pain; tolerance was not a common problem; and efforts to control diversion should not constrain opioid prescribing. This has now proven to be wrong. Experience regarding the risks for opioid addiction, misuse, and overdose in community practice has failed to support these assumptions.  According to the Centers for Disease Control and Prevention, fatal overdoses involving opioid analgesics have increased sharply over the past decade. Currently, more than 96,700 people die from drug overdoses every year. Opioids are a factor in 7 out of every 10 overdose deaths. Deaths from drug overdose have surpassed motor vehicle accidents as the leading cause of death for individuals between the ages of 43 and 53.  Clinical data suggest that neuroendocrine dysfunction may be very common in both men and women, potentially causing hypogonadism, erectile dysfunction,  infertility, decreased libido, osteoporosis, and depression. Recent studies linked higher opioid dose to increased opioid-related mortality. Controlled observational studies reported that long-term opioid therapy may be associated with increased risk for cardiovascular events. Subsequent meta-analysis concluded that the safety of  long-term opioid therapy in elderly patients has not been proven.   Side Effects and adverse reactions: Common side effects: Drowsiness (sedation). Dizziness. Nausea and vomiting. Constipation. Physical dependence -- Dependence often manifests with withdrawal symptoms when opioids are discontinued or decreased. Tolerance -- As you take repeated doses of opioids, you require increased medication to experience the same effect of pain relief. Respiratory depression -- This can occur in healthy people, especially with higher doses. However, people with COPD, asthma or other lung conditions may be even more susceptible to fatal respiratory impairment.  Uncommon side effects: An increased sensitivity to feeling pain and extreme response to pain (hyperalgesia). Chronic use of opioids can lead to this. Delayed gastric emptying (the process by which the contents of your stomach are moved into your small intestine). Muscle rigidity. Immune system and hormonal dysfunction. Quick, involuntary muscle jerks (myoclonus). Arrhythmia. Itchy skin (pruritus). Dry mouth (xerostomia).  Long-term side effects: Chronic constipation. Sleep-disordered breathing (SDB). Increased risk of bone fractures. Hypothalamic-pituitary-adrenal dysregulation. Increased risk of overdose.  RISKS: Respiratory depression and death: Opioids increase the risk of respiratory depression and death.  Drug-to-drug interactions: Opioids are relatively contraindicated in combination with benzodiazepines, sleep inducers, and other central nervous system depressants. Other classes of medications (i.e.: certain  antibiotics and even over-the-counter medications) may also trigger or induce respiratory depression in some patients.  Medical conditions: Patients with pre-existing respiratory problems are at higher risk of respiratory failure and/or depression when in combination with opioid analgesics. Opioids are relatively contraindicated in some medical conditions such as central sleep apnea.   Fractures and Falls:  Opioids increase the risk and incidence of falls. This is of particular importance in elderly patients.  Endocrine System:  Long-term administration is associated with endocrine abnormalities (endocrinopathies). (Also known as Opioid-induced Endocrinopathy) Influences on both the hypothalamic-pituitary-adrenal axis?and the hypothalamic-pituitary-gonadal axis have been demonstrated with consequent hypogonadism and adrenal insufficiency in both sexes. Hypogonadism and decreased levels of dehydroepiandrosterone sulfate have been reported in men and women. Endocrine effects include: Amenorrhoea in women (abnormal absence of menstruation) Reduced libido in both sexes Decreased sexual function Erectile dysfunction in men Hypogonadisms (decreased testicular function with shrinkage of testicles) Infertility Depression and fatigue Loss of muscle mass Anxiety Depression Immune suppression Hyperalgesia Weight gain Anemia Osteoporosis Patients (particularly women of childbearing age) should avoid opioids. There is insufficient evidence to recommend routine monitoring of asymptomatic patients taking opioids in the long-term for hormonal deficiencies.  Immune System: Human studies have demonstrated that opioids have an immunomodulating effect. These effects are mediated via opioid receptors both on immune effector cells and in the central nervous system. Opioids have been demonstrated to have adverse effects on antimicrobial response and anti-tumour surveillance. Buprenorphine has been  demonstrated to have no impact on immune function.  Opioid Induced Hyperalgesia: Human studies have demonstrated that prolonged use of opioids can lead to a state of abnormal pain sensitivity, sometimes called opioid induced hyperalgesia (OIH). Opioid induced hyperalgesia is not usually seen in the absence of tolerance to opioid analgesia. Clinically, hyperalgesia may be diagnosed if the patient on long-term opioid therapy presents with increased pain. This might be qualitatively and anatomically distinct from pain related to disease progression or to breakthrough pain resulting from development of opioid tolerance. Pain associated with hyperalgesia tends to be more diffuse than the pre-existing pain and less defined in quality. Management of opioid induced hyperalgesia requires opioid dose reduction.  Cancer: Chronic opioid therapy has been associated with an increased risk of cancer among noncancer patients with  chronic pain. This association was more evident in chronic strong opioid users. Chronic opioid consumption causes significant pathological changes in the small intestine and colon. Epidemiological studies have found that there is a link between opium dependence and initiation of gastrointestinal cancers. Cancer is the second leading cause of death after cardiovascular disease. Chronic use of opioids can cause multiple conditions such as GERD, immunosuppression and renal damage as well as carcinogenic effects, which are associated with the incidence of cancers.   Mortality: Long-term opioid use has been associated with increased mortality among patients with chronic non-cancer pain (CNCP).  Prescription of long-acting opioids for chronic noncancer pain was associated with a significantly increased risk of all-cause mortality, including deaths from causes other than overdose.  Reference: Von Korff M, Kolodny A, Deyo RA, Chou R. Long-term opioid therapy reconsidered. Ann Intern Med. 2011 Sep  6;155(5):325-8. doi: 10.7326/[id number]. PMID: 40981191; PMCID: [id number]. Randon Goldsmith, Hayward RA, Dunn KM, [place] KP. Risk of adverse events in patients prescribed long-term opioids: A cohort study in the [place] [facility] Research Datalink. Eur J Pain. 2019 May;23(5):908-922. doi: 10.1002/ejp.1357. Epub 2019 Jan 31. PMID: 08657846. Colameco S, Coren JS, [place] CA. Continuous opioid treatment for chronic noncancer pain: a time for moderation in prescribing. Postgrad Med. 2009 Jul;121(4):61-6. doi: 10.3810/pgm.2009.07.2032. PMID: 96295284. William Hamburger RN, [place], Blazina I, Cristopher Peru, Bougatsos C, Deyo RA. The effectiveness and risks of long-term opioid therapy for chronic pain: a systematic review for a [employer] of [employer]ways to [employer]. Ann Intern Med. 2015 Feb 17;162(4):276-86. doi: 10.7326/M14-2559. PMID: 13244010. Caryl Bis [facility], [place] DM. NCHS Data Brief No. 22. [place]: Centers for Disease Control and Prevention; 2009. Sep, Increase in Fatal Poisonings Involving Opioid Analgesics in the [place], 1999-2006. Song IA, Choi HR, Oh TK. Long-term opioid use and mortality in patients with chronic non-cancer pain: Ten-year follow-up study in [place] from 2010 through 2019. EClinicalMedicine. 2022 Jul 18;51:101558. doi: 10.1016/j.eclinm.2022.272536. PMID: 64403474; PMCID: [id number]. Huser, W., Schubert, T., Vogelmann, T. et al. All-cause mortality in patients with long-term opioid therapy compared with non-opioid analgesics for chronic non-cancer pain: a database study. BMC Med 18, 162 (2020). http://lester.info/ Rashidian H, Karie Kirks, Malekzadeh R, Haghdoost AA. An Ecological Study of the Association between Opiate Use and Incidence of Cancers. Addict Health. 2016 Fall;8(4):252-260. PMID: 43329518; PMCID: [id number].  Our Goal: Our goal is to control your  pain with means other than the use of opioid pain medications.  Our Recommendation: Talk to your physician about coming off of these medications. We can assist you with the tapering down and stopping these medicines. Based on the new information, even if you cannot completely stop the medication, a decrease in the dose may be associated with a lesser risk. Ask for other means of controlling the pain. Decrease or eliminate those factors that significantly contribute to your pain such as smoking, obesity, and a diet heavily tilted towards "inflammatory" nutrients.  Last Updated: 08/19/2022   ______________________________________________________________________       ______________________________________________________________________    National Pain Medication Shortage  The [place] is experiencing worsening drug shortages. These have had a negative widespread effect on patient care and treatment. Not expected to improve any time soon. Predicted to last past 2029.   Drug shortage list (generic names) Oxycodone IR Oxycodone/APAP Oxymorphone IR Hydromorphone Hydrocodone/APAP Morphine  Where is the problem?  Manufacturing and supply level.  Will this shortage affect you?  Only if you  take any of the above pain medications.  How? You may be unable to fill your prescription.  Your pharmacist may offer a "partial fill" of your prescription. (Warning: Do not accept partial fills.) Prescriptions partially filled cannot be transferred to another pharmacy. Read our Medication Rules and Regulation. Depending on how much medicine you are dependent on, you may experience withdrawals when unable to get the medication.  Recommendations: Consider ending your dependence on opioid pain medications. Ask your pain specialist to assist you with the process. Consider switching to a medication currently not in shortage, such as Buprenorphine. Talk to your pain specialist about this option. Consider  decreasing your pain medication requirements by managing tolerance thru "Drug Holidays". This may help minimize withdrawals, should you run out of medicine. Control your pain thru the use of non-pharmacological interventional therapies.   Your prescriber: Prescribers cannot be blamed for shortages. Medication manufacturing and supply issues cannot be fixed by the prescriber.   NOTE: The prescriber is not responsible for supplying the medication, or solving supply issues. Work with your pharmacist to solve it. The patient is responsible for the decision to take or continue taking the medication and for identifying and securing a legal supply source. By law, supplying the medication is the job and responsibility of the pharmacy. The prescriber is responsible for the evaluation, monitoring, and prescribing of these medications.   Prescribers will NOT: Re-issue prescriptions that have been partially filled. Re-issue prescriptions already sent to a pharmacy.  Re-send prescriptions to a different pharmacy because yours did not have your medication. Ask pharmacist to order more medicine or transfer the prescription to another pharmacy. (Read below.)  New 2023 regulation: "October 12, 2021 Revised Regulation Allows DEA-Registered Pharmacies to Transfer Electronic Prescriptions at a Patient's Request DEA Headquarters Division - Public Information Office Patients now have the ability to request their electronic prescription be transferred to another pharmacy without having to go back to their practitioner to initiate the request. This revised regulation went into effect on Monday, October 08, 2021.     At a patient's request, a DEA-registered retail pharmacy can now transfer an electronic prescription for a controlled substance (schedules II-V) to another DEA-registered retail pharmacy. Prior to this change, patients would have to go through their practitioner to cancel their prescription and have it  re-issued to a different pharmacy. The process was taxing and time consuming for both patients and practitioners.    The Drug Enforcement Administration [facility]) published its intent to revise the process for transferring electronic prescriptions on December 31, 2019.  The final rule was published in the federal register on September 06, 2021 and went into effect 30 days later.  Under the final rule, a prescription can only be transferred once between pharmacies, and only if allowed under existing state or other applicable law. The prescription must remain in its electronic form; may not be altered in any way; and the transfer must be communicated directly between two licensed pharmacists. It's important to note, any authorized refills transfer with the original prescription, which means the entire prescription will be filled at the same pharmacy".  Reference: [email or web address] [facility] website announcement)  [email or web address] [employer])   [employer] / Vol. 88, No. 143 / Thursday, September 06, 2021 / Rules and Regulations DEPARTMENT OF JUSTICE  Drug Enforcement Administration  21 CFR Part 1306  [Docket No. [id number]]  RIN [id number] Transfer of Electronic Prescriptions for Schedules II-V Controlled Substances Between Pharmacies for Initial Filling  ______________________________________________________________________  ______________________________________________________________________    Transfer of Pain Medication between Pharmacies  Re: 2023 DEA Clarification on existing regulation  Published on DEA Website: October 12, 2021  Title: Revised Regulation Allows DEA-Registered Pharmacies to Electrical engineer Prescriptions at a Patient's Request DEA Headquarters Division - [employer]  "Patients now  have the ability to request their electronic prescription be transferred to another pharmacy without having to go back to their practitioner to initiate the request. This revised regulation went into effect on Monday, October 08, 2021.     At a patient's request, a DEA-registered retail pharmacy can now transfer an electronic prescription for a controlled substance (schedules II-V) to another DEA-registered retail pharmacy. Prior to this change, patients would have to go through their practitioner to cancel their prescription and have it re-issued to a different pharmacy. The process was taxing and time consuming for both patients and practitioners.    The Drug Enforcement Administration [facility]) published its intent to revise the process for transferring electronic prescriptions on December 31, 2019.  The final rule was published in the federal register on September 06, 2021 and went into effect 30 days later.  Under the final rule, a prescription can only be transferred once between pharmacies, and only if allowed under existing state or other applicable law. The prescription must remain in its electronic form; may not be altered in any way; and the transfer must be communicated directly between two licensed pharmacists. It's important to note, any authorized refills transfer with the original prescription, which means the entire prescription will be filled at the same pharmacy."    REFERENCES: 1. DEA website announcement [email or web address]  2. Department of Justice website  [email or web address]  3. DEPARTMENT OF JUSTICE Drug Enforcement Administration 21 CFR Part 1306 [Docket No. [id number]] RIN [id number] "Transfer of Electronic Prescriptions for Schedules II-V Controlled Substances Between Pharmacies for Initial  Filling"  ______________________________________________________________________       ______________________________________________________________________    Medication Rules  Purpose: To inform patients, and their family members, of our medication rules and regulations.  Applies to: All patients receiving prescriptions from our practice (written or electronic).  Pharmacy of record: This is the pharmacy where your electronic prescriptions will be sent. Make sure we have the correct one.  Electronic prescriptions: In compliance with the [facility] Strengthen Opioid Misuse Prevention (STOP) Act of 2017 (Session Conni Elliot [phone]), effective February 11, 2018, all controlled substances must be electronically prescribed. Written prescriptions, faxing, or calling prescriptions to a pharmacy will no longer be done.  Prescription refills: These will be provided only during in-person appointments. No medications will be renewed without a "face-to-face" evaluation with your provider. Applies to all prescriptions.  NOTE: The following applies primarily to controlled substances (Opioid* Pain Medications).   Type of encounter (visit): For patients receiving controlled substances, face-to-face visits are required. (Not an option and not up to the patient.)  Patient's Responsibilities: Pain Pills: Bring all pain pills to every appointment (except for procedure appointments). Pill counts are required.  Pill Bottles: Bring pills in original pharmacy bottle. Bring bottle, even if empty. Always bring the bottle of the most recent fill.  Medication refills: You are responsible for knowing and keeping track of what medications you are taking and when is it that you will need a refill. The day before your appointment: write a list of all prescriptions that need to be refilled. The day of the appointment: give the list to the admitting nurse. Prescriptions will be written only during appointments. No  prescriptions will be written on procedure days. If you forget a medication: it will not be "Called in", "Faxed", or "electronically sent". You will need to get another appointment to get these prescribed. No early refills. Do not call asking to have your prescription filled early. Partial  or short prescriptions: Occasionally your pharmacy may not have enough pills to fill your prescription.  NEVER ACCEPT a partial fill or a prescription that is short of the total amount of pills that you were prescribed.  With controlled substances the law allows 72 hours for the pharmacy to complete the prescription.  If the prescription is not completed within 72 hours, the pharmacist will require a new prescription to be written. This means that you will be short on your medicine and we WILL NOT send another prescription to complete your original prescription.  Instead, request the pharmacy to send a carrier to a nearby branch to get enough medication to provide you with your full prescription. Prescription Accuracy: You are responsible for carefully inspecting your prescriptions before leaving our office. Have the discharge nurse carefully go over each prescription with you, before taking them home. Make sure that your name is accurately spelled, that your address is correct. Check the name and dose of your medication to make sure it is accurate. Check the number of pills, and the written instructions to make sure they are clear and accurate. Make sure that you are given enough medication to last until your next medication refill appointment. Taking Medication: Take medication as prescribed. When it comes to controlled substances, taking less pills or less frequently than prescribed is permitted and encouraged. Never take more pills than instructed. Never take the medication more frequently than prescribed.  Inform other Doctors: Always inform, all of your healthcare providers, of all the medications you take. Pain  Medication from other Providers: You are not allowed to accept any additional pain medication from any other Doctor or Healthcare provider. There are two exceptions to this rule. (see below) In the event that you require additional pain medication, you are responsible for notifying us, as stated below. Cough Medicine: Often these contain an opioid, such as codeine or hydrocodone. Never accept or take cough medicine containing these opioids if you are already taking an opioid* medication. The combination may cause respiratory failure and death. Medication Agreement: You are responsible for carefully reading and following our Medication Agreement. This must be signed before receiving any prescriptions from our practice. Safely store a copy of your signed Agreement. Violations to the Agreement will result in no further prescriptions. (Additional copies of our Medication Agreement are available upon request.) Laws, Rules, & Regulations: All patients are expected to follow all [address] and [employer], Rules, [place] [employer]. Ignorance of the Laws does not constitute a valid excuse.  Illegal drugs and Controlled Substances: The use of illegal substances (including, but not limited to marijuana and its derivatives) and/or the illegal use of any controlled substances is strictly prohibited. Violation of this rule may result in the immediate and permanent discontinuation of any and all prescriptions being written by our practice. The use of any illegal substances is prohibited. Adopted CDC guidelines & recommendations: Target dosing levels will be at or below 60 MME/day. Use of benzodiazepines** is not recommended. Urine Drug testing: Patients taking controlled substances will be required to provide a urine sample upon request. Do not void before coming to your medication management appointments. Hold emptying your bladder until you are admitted. The admitting nurse will  inform you if a sample is required. Our  practice reserves the right to call you at any time to provide a sample. Once receiving the call, you have 24 hours to comply with request. Not providing a sample upon request may result in termination of medication therapy.  Exceptions: There are only two exceptions to the rule of not receiving pain medications from other Healthcare Providers. Exception #1 (Emergencies): In the event of an emergency (i.e.: accident requiring emergency care), you are allowed to receive additional pain medication. However, you are responsible for: As soon as you are able, call our office [phone], at any time of the day or night, and leave a message stating your name, the date and nature of the emergency, and the name and dose of the medication prescribed. In the event that your call is answered by a member of our staff, make sure to document and save the date, time, and the name of the person that took your information.  Exception #2 (Planned Surgery): In the event that you are scheduled by another doctor or dentist to have any type of surgery or procedure, you are allowed (for a period no longer than 30 days), to receive additional pain medication, for the acute post-op pain. However, in this case, you are responsible for picking up a copy of our "Post-op Pain Management for Surgeons" handout, and giving it to your surgeon or dentist. This document is available at our office, and does not require an appointment to obtain it. Simply go to our office during business hours (Monday-Thursday from 8:00 AM to 4:00 PM) (Friday 8:00 AM to 12:00 Noon) or if you have a scheduled appointment with [place], prior to your surgery, and ask for it by name. In addition, you are responsible for: calling our office [phone], at any time of the day or night, and leaving a message stating your name, name of your surgeon, type of surgery, and date of procedure or surgery. Failure to comply with your responsibilities may result in termination  of therapy involving the controlled substances.  Consequences:  Non-compliance with the above rules may result in permanent discontinuation of medication prescription therapy. All patients receiving any type of controlled substance is expected to comply with the above patient responsibilities. Not doing so may result in permanent discontinuation of medication prescription therapy. Medication Agreement Violation. Following the above rules, including your responsibilities will help you in avoiding a Medication Agreement Violation ("Breaking your Pain Medication Contract").  *Opioid medications include: morphine, codeine, oxycodone, oxymorphone, hydrocodone, hydromorphone, meperidine, tramadol, tapentadol, buprenorphine, fentanyl, methadone. **Benzodiazepine medications include: diazepam (Valium), alprazolam (Xanax), clonazepam (Klonopine), lorazepam (Ativan), clorazepate (Tranxene), chlordiazepoxide (Librium), estazolam (Prosom), oxazepam (Serax), temazepam (Restoril), triazolam (Halcion) (Last updated: 12/04/2022) ______________________________________________________________________      ______________________________________________________________________    Medication Recommendations and Reminders  Applies to: All patients receiving prescriptions (written and/or electronic).  Medication Rules & Regulations: You are responsible for reading, knowing, and following our "Medication Rules" document. These exist for your safety and that of others. They are not flexible and neither are we. Dismissing or ignoring them is an act of "non-compliance" that may result in complete and irreversible termination of such medication therapy. For safety reasons, "non-compliance" will not be tolerated. As with the [place] fundamental legal principle of "ignorance of the law is no defense", we will accept no excuses for not having read and knowing the content of documents provided to you by our practice.  Pharmacy of  record:  Definition: This is the  pharmacy where your electronic prescriptions will be sent.  We do not endorse any particular pharmacy. It is up to you and your insurance to decide what pharmacy to use.  We do not restrict you in your choice of pharmacy. However, once we write for your prescriptions, we will NOT be re-sending more prescriptions to fix restricted supply problems created by your pharmacy, or your insurance.  The pharmacy listed in the electronic medical record should be the one where you want electronic prescriptions to be sent. If you choose to change pharmacy, simply notify our nursing staff. Changes will be made only during your regular appointments and not over the phone.  Recommendations: Keep all of your pain medications in a safe place, under lock and key, even if you live alone. We will NOT replace lost, stolen, or damaged medication. We do not accept "Police Reports" as proof of medications having been stolen. After you fill your prescription, take 1 week's worth of pills and put them away in a safe place. You should keep a separate, properly labeled bottle for this purpose. The remainder should be kept in the original bottle. Use this as your primary supply, until it runs out. Once it's gone, then you know that you have 1 week's worth of medicine, and it is time to come in for a prescription refill. If you do this correctly, it is unlikely that you will ever run out of medicine. To make sure that the above recommendation works, it is very important that you make sure your medication refill appointments are scheduled at least 1 week before you run out of medicine. To do this in an effective manner, make sure that you do not leave the office without scheduling your next medication management appointment. Always ask the nursing staff to show you in your prescription , when your medication will be running out. Then arrange for the receptionist to get you a return appointment, at least  7 days before you run out of medicine. Do not wait until you have 1 or 2 pills left, to come in. This is very poor planning and does not take into consideration that we may need to cancel appointments due to bad weather, sickness, or emergencies affecting our staff. DO NOT ACCEPT A "Partial Fill": If for any reason your pharmacy does not have enough pills/tablets to completely fill or refill your prescription, do not allow for a "partial fill". The law allows the pharmacy to complete that prescription within 72 hours, without requiring a new prescription. If they do not fill the rest of your prescription within those 72 hours, you will need a separate prescription to fill the remaining amount, which we will NOT provide. If the reason for the partial fill is your insurance, you will need to talk to the pharmacist about payment alternatives for the remaining tablets, but again, DO NOT ACCEPT A PARTIAL FILL, unless you can trust your pharmacist to obtain the remainder of the pills within 72 hours.  Prescription refills and/or changes in medication(s):  Prescription refills, and/or changes in dose or medication, will be conducted only during scheduled medication management appointments. (Applies to both, written and electronic prescriptions.) No refills on procedure days. No medication will be changed or started on procedure days. No changes, adjustments, and/or refills will be conducted on a procedure day. Doing so will interfere with the diagnostic portion of the procedure. No phone refills. No medications will be "called into the pharmacy". No Fax refills. No weekend refills. No Holliday  refills. No after hours refills.  Remember:  Business hours are:  Monday to Thursday 8:00 AM to 4:00 PM Provider's Schedule: Delano Metz, MD - Appointments are:  Medication management: Monday and Wednesday 8:00 AM to 4:00 PM Procedure day: Tuesday and Thursday 7:30 AM to 4:00 PM Edward Jolly, MD -  Appointments are:  Medication management: Tuesday and Thursday 8:00 AM to 4:00 PM Procedure day: Monday and Wednesday 7:30 AM to 4:00 PM (Last update: 12/04/2021) ______________________________________________________________________      ______________________________________________________________________     Naloxone Nasal Spray  Why am I receiving this medication? [place] STOP ACT requires that all patients taking high dose opioids or at risk of opioids respiratory depression, be prescribed an opioid reversal agent, such as Naloxone (AKA: Narcan).  What is this medication? NALOXONE (nal OX one) treats opioid overdose, which causes slow or shallow breathing, severe drowsiness, or trouble staying awake. Call emergency services after using this medication. You may need additional treatment. Naloxone works by reversing the effects of opioids. It belongs to a group of medications called opioid blockers.  COMMON BRAND NAME(S): Kloxxado, Narcan  What should I tell my care team before I take this medication? They need to know if you have any of these conditions: Heart disease Substance use disorder An unusual or allergic reaction to naloxone, other medications, foods, dyes, or preservatives Pregnant or trying to get pregnant Breast-feeding  When to use this medication? This medication is to be used for the treatment of respiratory depression (less than 8 breaths per minute) secondary to opioid overdose.   How to use this medication? This medication is for use in the nose. Lay the person on their back. Support their neck with your hand and allow the head to tilt back before giving the medication. The nasal spray should be given into 1 nostril. After giving the medication, move the person onto their side. Do not remove or test the nasal spray until ready to use. Get emergency medical help right away after giving the first dose of this medication, even if the person wakes up. You  should be familiar with how to recognize the signs and symptoms of a narcotic overdose. If more doses are needed, give the additional dose in the other nostril. Talk to your care team about the use of this medication in children. While this medication may be prescribed for children as young as newborns for selected conditions, precautions do apply.  Naloxone Overdosage: If you think you have taken too much of this medicine contact a poison control center or emergency room at once.  NOTE: This medicine is only for you. Do not share this medicine with others.  What if I miss a dose? This does not apply.  What may interact with this medication? This is only used during an emergency. No interactions are expected during emergency use. This list may not describe all possible interactions. Give your health care provider a list of all the medicines, herbs, non-prescription drugs, or dietary supplements you use. Also tell them if you smoke, drink alcohol, or use illegal drugs. Some items may interact with your medicine.  What should I watch for while using this medication? Keep this medication ready for use in the case of an opioid overdose. Make sure that you have the phone number of your care team and local hospital ready. You may need to have additional doses of this medication. Each nasal spray contains a single dose. Some emergencies may require additional doses. After use, bring  the treated person to the nearest hospital or call 911. Make sure the treating care team knows that the person has received a dose of this medication. You will receive additional instructions on what to do during and after use of this medication before an emergency occurs.  What side effects may I notice from receiving this medication? Side effects that you should report to your care team as soon as possible: Allergic reactions--skin rash, itching, hives, swelling of the face, lips, tongue, or throat Side effects that  usually do not require medical attention (report these to your care team if they continue or are bothersome): Constipation Dryness or irritation inside the nose Headache Increase in blood pressure Muscle spasms Stuffy nose Toothache This list may not describe all possible side effects. Call your doctor for medical advice about side effects. You may report side effects to FDA at 1-800-FDA-1088.  Where should I keep my medication? Because this is an emergency medication, you should keep it with you at all times.  Keep out of the reach of children and pets. Store between 20 and 25 degrees C (68 and 77 degrees F). Do not freeze. Throw away any unused medication after the expiration date. Keep in original box until ready to use.  NOTE: This sheet is a summary. It may not cover all possible information. If you have questions about this medicine, talk to your doctor, pharmacist, or health care provider.   2023 [employer] Standard (2020-10-06 00:00:00)  ______________________________________________________________________

## 2023-02-19 ENCOUNTER — Ambulatory Visit: Payer: 59 | Attending: Pain Medicine | Admitting: Pain Medicine

## 2023-02-19 ENCOUNTER — Encounter: Payer: Self-pay | Admitting: Pain Medicine

## 2023-02-19 DIAGNOSIS — Z79899 Other long term (current) drug therapy: Secondary | ICD-10-CM | POA: Diagnosis not present

## 2023-02-19 DIAGNOSIS — M545 Low back pain, unspecified: Secondary | ICD-10-CM | POA: Diagnosis not present

## 2023-02-19 DIAGNOSIS — M25551 Pain in right hip: Secondary | ICD-10-CM | POA: Insufficient documentation

## 2023-02-19 DIAGNOSIS — M160 Bilateral primary osteoarthritis of hip: Secondary | ICD-10-CM | POA: Diagnosis not present

## 2023-02-19 DIAGNOSIS — G894 Chronic pain syndrome: Secondary | ICD-10-CM | POA: Insufficient documentation

## 2023-02-19 DIAGNOSIS — Z79891 Long term (current) use of opiate analgesic: Secondary | ICD-10-CM | POA: Diagnosis not present

## 2023-02-19 DIAGNOSIS — M79604 Pain in right leg: Secondary | ICD-10-CM | POA: Insufficient documentation

## 2023-02-19 DIAGNOSIS — M47816 Spondylosis without myelopathy or radiculopathy, lumbar region: Secondary | ICD-10-CM | POA: Diagnosis not present

## 2023-02-19 DIAGNOSIS — G8929 Other chronic pain: Secondary | ICD-10-CM | POA: Diagnosis not present

## 2023-02-19 DIAGNOSIS — M4186 Other forms of scoliosis, lumbar region: Secondary | ICD-10-CM | POA: Insufficient documentation

## 2023-02-19 DIAGNOSIS — M461 Sacroiliitis, not elsewhere classified: Secondary | ICD-10-CM | POA: Diagnosis not present

## 2023-02-19 MED ORDER — OXYCODONE HCL 5 MG PO TABS: 5.0000 mg | ORAL_TABLET | Freq: Two times a day (BID) | ORAL | 0 refills | Status: DC | PRN

## 2023-02-19 NOTE — Progress Notes (Signed)
 Nursing Pain Medication Assessment:  Safety precautions to be maintained throughout the outpatient stay will include: orient to surroundings, keep bed in low position, maintain call bell within reach at all times, provide assistance with transfer out of bed and ambulation.  Medication Inspection Compliance: Pill count conducted under aseptic conditions, in front of the patient. Neither the pills nor the bottle was removed from the patient's sight at any time. Once count was completed pills were immediately returned to the patient in their original bottle.  Medication: Oxycodone  IR Pill/Patch Count:  25 of 60 pills remain Pill/Patch Appearance: Markings consistent with prescribed medication Bottle Appearance: Standard pharmacy container. Clearly labeled. Filled Date: 40 / 14 / 2024 Last Medication intake:  Today

## 2023-02-20 DIAGNOSIS — Z720 Tobacco use: Secondary | ICD-10-CM | POA: Diagnosis not present

## 2023-02-20 DIAGNOSIS — R0602 Shortness of breath: Secondary | ICD-10-CM | POA: Diagnosis not present

## 2023-02-20 DIAGNOSIS — J449 Chronic obstructive pulmonary disease, unspecified: Secondary | ICD-10-CM | POA: Diagnosis not present

## 2023-02-20 DIAGNOSIS — I714 Abdominal aortic aneurysm, without rupture, unspecified: Secondary | ICD-10-CM | POA: Diagnosis not present

## 2023-02-20 DIAGNOSIS — I10 Essential (primary) hypertension: Secondary | ICD-10-CM | POA: Diagnosis not present

## 2023-02-20 DIAGNOSIS — I2510 Atherosclerotic heart disease of native coronary artery without angina pectoris: Secondary | ICD-10-CM | POA: Diagnosis not present

## 2023-02-20 DIAGNOSIS — E785 Hyperlipidemia, unspecified: Secondary | ICD-10-CM | POA: Diagnosis not present

## 2023-03-21 ENCOUNTER — Other Ambulatory Visit: Payer: 59

## 2023-03-21 DIAGNOSIS — R972 Elevated prostate specific antigen [PSA]: Secondary | ICD-10-CM

## 2023-03-22 LAB — PSA: Prostate Specific Ag, Serum: 13.8 ng/mL — ABNORMAL HIGH (ref 0.0–4.0)

## 2023-03-26 ENCOUNTER — Encounter: Payer: Self-pay | Admitting: Urology

## 2023-03-26 ENCOUNTER — Ambulatory Visit: Payer: 59 | Admitting: Urology

## 2023-03-26 VITALS — BP 82/54 | HR 93 | Ht 71.0 in | Wt 190.0 lb

## 2023-03-26 DIAGNOSIS — R972 Elevated prostate specific antigen [PSA]: Secondary | ICD-10-CM | POA: Diagnosis not present

## 2023-03-26 NOTE — Progress Notes (Signed)
I, Maysun Anabel Bene, acting as a scribe for Riki Altes, MD., have documented all relevant documentation on the behalf of Riki Altes, MD, as directed by Riki Altes, MD while in the presence of Riki Altes, MD.  03/26/2023 3:27 PM   Charles Ewing January 12, 1941 [id number]  Referring provider: Smitty Cords, DO [address] [place],  [place] [address]  Chief Complaint  Patient presents with   Elevated PSA   Urologic history: 1. Elevated PSA  PSA has been as high as 8.19 February 2015.  PSA in 2020 was 6.8 and 2022 was 8.29. Last PSA May 2024 was 11.48  HPI: Charles Ewing is a 83 y.o. male presents for 3 month follow up.  PSA slightly higher at 13.8 No bothersome lower urinary tract symptoms. Denies dysuria, gross hematuria.  PSA trend   Prostate Specific Ag, Serum  Latest Ref Rng 0.0 - 4.0 ng/mL  09/23/2022 13.2 (H)   03/21/2023 13.8 (H)      PMH: Past Medical History:  Diagnosis Date   Abdominal aortic aneurysm (AAA) (HCC) 2013   [facility]   Anxiety    Arrhythmia    Arthritis    Colon polyp    COPD (chronic obstructive pulmonary disease) (HCC)    Coronary artery disease    Depression    GERD (gastroesophageal reflux disease)    Hyperlipidemia    Hypertension    Skin cancer    Thrush     Surgical History: Past Surgical History:  Procedure Laterality Date   CATARACT EXTRACTION W/PHACO Left 05/09/2020   Procedure: CATARACT EXTRACTION PHACO AND INTRAOCULAR LENS PLACEMENT (IOC) LEFT DIABETIC 9.09 01:02.0;  Surgeon: Galen Manila, MD;  [facility];  Service: Ophthalmology;  Laterality: Left;   CATARACT EXTRACTION W/PHACO Right 05/23/2020   Procedure: CATARACT EXTRACTION PHACO AND INTRAOCULAR LENS PLACEMENT (IOC) RIGHT DIABETIC 8.13 01:00.3;  Surgeon: Galen Manila, MD;  Location: [facility];  Service: Ophthalmology;  Laterality: Right;   Colon polyp surgery     SKIN CANCER EXCISION     TRANSURETHRAL RESECTION  OF PROSTATE  2010    Home Medications:  Allergies as of 03/26/2023       Reactions   Nexium [esomeprazole Magnesium]         Medication List        Accurate as of March 26, 2023  3:27 PM. If you have any questions, ask your nurse or doctor.          STOP taking these medications    aspirin EC 81 MG tablet Stopped by: Verna Czech [place] Droll   LORazepam 0.5 MG tablet Commonly known as: ATIVAN Stopped by: Riki Altes   naloxone 4 MG/0.1ML Liqd nasal spray kit Commonly known as: NARCAN Stopped by: Riki Altes       TAKE these medications    albuterol 108 (90 Base) MCG/ACT inhaler Commonly known as: VENTOLIN HFA Inhale 2 puffs into the lungs every 6 (six) hours as needed for wheezing or shortness of breath.   amLODipine 5 MG tablet Commonly known as: NORVASC TAKE 1 TABLET BY MOUTH DAILY   cyanocobalamin 1000 MCG tablet Commonly known as: VITAMIN B12 Take 1,000 mcg by mouth daily.   FLUoxetine 20 MG capsule Commonly known as: PROZAC TAKE ONE CAPSULE BY MOUTH TWICE A DAY   fluticasone 50 MCG/ACT nasal spray Commonly known as: FLONASE PLACE 2 SPRAYS INTO BOTH NOSTRILS DAILY   furosemide 20 MG  tablet Commonly known as: LASIX TAKE 1 TABLET BY MOUTH EVERY OTHER DAY   losartan 25 MG tablet Commonly known as: COZAAR TAKE 1 TABLET BY MOUTH DAILY   meloxicam 15 MG tablet Commonly known as: MOBIC TAKE 1 TABLET BY MOUTH DAILY AS NEEDED FOR PAIN   oxyCODONE 5 MG immediate release tablet Commonly known as: Oxy IR/ROXICODONE Take 1 tablet (5 mg total) by mouth 2 (two) times daily as needed for severe pain (pain score 7-10). Each refill must last 30 days.   oxyCODONE 5 MG immediate release tablet Commonly known as: Oxy IR/ROXICODONE Take 1 tablet (5 mg total) by mouth 2 (two) times daily as needed for severe pain (pain score 7-10). Each refill must last 30 days. Start taking on: April 02, 2023   oxyCODONE 5 MG immediate release tablet Commonly known  as: Oxy IR/ROXICODONE Take 1 tablet (5 mg total) by mouth 2 (two) times daily as needed for severe pain (pain score 7-10). Each refill must last 30 days. Start taking on: May 02, 2023   pantoprazole 20 MG tablet Commonly known as: PROTONIX Take 1 tablet by mouth daily.   potassium chloride 10 MEQ CR capsule Commonly known as: MICRO-K TAKE 1 CAPSULE BY MOUTH EVERY OTHER DAY   simvastatin 20 MG tablet Commonly known as: ZOCOR Take 1 tablet (20 mg total) by mouth at bedtime.   Spiriva Respimat 2.5 MCG/ACT Aers Generic drug: Tiotropium Bromide Monohydrate INHALE 2 PUFFS BY MOUTH INTO THE LUNGS DAILY   tamsulosin 0.4 MG Caps capsule Commonly known as: FLOMAX TAKE 1 CAPSULE BY MOUTH DAILY AFTER BREAKFAST        Allergies:  Allergies  Allergen Reactions   Nexium [Esomeprazole Magnesium]     Family History: Family History  Problem Relation Age of Onset   Kidney cancer Mother    Prostate cancer Neg Hx    Bladder Cancer Neg Hx     Social History:  reports that he has been smoking cigarettes. He has a 70 pack-year smoking history. He has never used smokeless tobacco. He reports that he does not drink alcohol and does not use drugs.   Physical Exam: BP (!) 82/54   Pulse 93   Ht 5\' 11"  (1.803 m)   Wt 190 lb (86.2 kg)   BMI 26.50 kg/m   Constitutional:  Alert and oriented, No acute distress. HEENT: [place] AT Respiratory: Normal respiratory effort, no increased work of breathing. Psychiatric: Normal mood and affect.   Assessment & Plan:    1. 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  We again discuss prostate biopsy versus observation based on age.  Today he would be inclined to seek treatment if he was diagnosed with high-grade prostate cancer and would like to schedule prostate biopsy.  Based on PSA level and peripheral zone lesion, will plan on a cognitive biopsy in lieu of fusion biopsy.  He was offered  biopsy and same-day surgery under sedation, however elected to pursue biopsy in office. We discussed potential complications of bleeding and infections/sepsis.   I have reviewed the above documentation for accuracy and completeness, and I agree with the above.   Riki Altes, MD  [facility] [address], [address] [place], [place] [address] [phone]

## 2023-03-26 NOTE — Patient Instructions (Signed)
Prostate Biopsy Instructions  Stop all aspirin or blood thinners (aspirin, plavix, coumadin, warfarin, motrin, ibuprofen, advil, aleve, naproxen, naprosyn) for 7 days prior to the procedure.  If you have any questions about stopping these medications, please contact your primary care physician or cardiologist.  Having a light meal prior to the procedure is recommended.  If you are diabetic or have low blood sugar please bring a small snack or glucose tablet.  A Fleets enema is needed to be purchased over the counter at a local pharmacy and used 2 hours before you scheduled appointment.  This can be purchased over the counter at any pharmacy.  Antibiotics will be administered in the clinic at the time of the procedure unless otherwise specified.    Please bring someone with you to the procedure to drive you home.  A follow up appointment has been scheduled for you to receive the results of the biopsy.  If you have any questions or concerns, please feel free to call the office at [phone] or send a Mychart message.    Thank you, Staff at [facility]

## 2023-03-27 ENCOUNTER — Ambulatory Visit: Payer: Self-pay | Admitting: *Deleted

## 2023-03-27 NOTE — Telephone Encounter (Signed)
  Chief Complaint: BP low 82/54 yesterday at dr. Isidore Moos for prostate check Symptoms: dizzy and lightheaded, shortness of breath Frequency: Intermittently Pertinent Negatives: Patient denies chest pain Disposition: [] ED /[] Urgent Care (no appt availability in office) / [x] Appointment(In office/virtual)/ []  [place] [facility]/ [] Home Care/ [] Refused Recommended Disposition /[] [place] [facility]/ [x]  Follow-up with PCP Additional Notes: No appts available with either provider for today or tomorrow.   Message being sent to see if he can be possibly worked in.    Pt. Agreeable to this plan.   Either provider was fine.

## 2023-03-27 NOTE — Telephone Encounter (Signed)
Appt made for 2 pm 03/28/23. Patient notified

## 2023-03-27 NOTE — Telephone Encounter (Signed)
Reason for Disposition  Fall in systolic BP > 20 mmHg after standing up  Answer Assessment - Initial Assessment Questions 1. BLOOD PRESSURE: "What is the blood pressure?" "Did you take at least two measurements 5 minutes apart?"     BP is low 82/54 yesterday at prostate dr's office.   I to see if there is any medicine I should not be taking.   No chest pain.   2. ONSET: "When did you take your blood pressure?"     Yesterday at dr office.   3. HOW: "How did you obtain the blood pressure?" (e.g., visiting nurse, automatic home BP monitor)     At office dr. 4. HISTORY: "Do you have a history of low blood pressure?" "What is your blood pressure normally?"     No   It's high BP 5. MEDICINES: "Are you taking any medications for blood pressure?" If Yes, ask: "Have they been changed recently?"     Yes  I'm on 2 medicines for BP now. 6. PULSE RATE: "Do you know what your pulse rate is?"      Not asked 7. OTHER SYMPTOMS: "Have you been sick recently?" "Have you had a recent injury?"     I'm wondering if it's too much medicine for BP. 8. PREGNANCY: "Is there any chance you are pregnant?" "When was your last menstrual period?"     N/A  Protocols used: Blood Pressure - Low-A-AH

## 2023-03-28 ENCOUNTER — Encounter: Payer: Self-pay | Admitting: Family Medicine

## 2023-03-28 ENCOUNTER — Ambulatory Visit: Payer: 59 | Admitting: Family Medicine

## 2023-03-28 VITALS — BP 130/58 | HR 67 | Ht 71.0 in | Wt 207.0 lb

## 2023-03-28 DIAGNOSIS — N183 Chronic kidney disease, stage 3 unspecified: Secondary | ICD-10-CM

## 2023-03-28 DIAGNOSIS — I129 Hypertensive chronic kidney disease with stage 1 through stage 4 chronic kidney disease, or unspecified chronic kidney disease: Secondary | ICD-10-CM

## 2023-03-28 DIAGNOSIS — R600 Localized edema: Secondary | ICD-10-CM | POA: Diagnosis not present

## 2023-03-28 DIAGNOSIS — J432 Centrilobular emphysema: Secondary | ICD-10-CM

## 2023-03-28 MED ORDER — BREZTRI AEROSPHERE 160-9-4.8 MCG/ACT IN AERO: 2.0000 | INHALATION_SPRAY | Freq: Two times a day (BID) | RESPIRATORY_TRACT | 11 refills | Status: AC

## 2023-03-28 NOTE — Progress Notes (Signed)
Subjective:    Patient ID: Charles Ewing, male    DOB: December 09, 1940, 83 y.o.   MRN: [id number]  Charles Ewing is a 83 y.o. male presenting on 03/28/2023 for Hypertension   HPI  Discussed the use of AI scribe software for clinical note transcription with the patient, who gave verbal consent to proceed.  History of Present Illness   Charles CANION "Bud" is an 83 year old male with hypertension and COPD who presents with dizziness and low blood pressure. He is accompanied by family member.  He experiences dizziness and low blood pressure, with a recent reading of 82/54 mmHg recorded two days ago. He attributes these symptoms to his blood pressure medications, specifically amlodipine and losartan, which he has stopped taking since the onset of dizziness. He notes feeling better since discontinuing these medications.  He has a history of hypertension and is currently prescribed amlodipine 5 mg daily and losartan 25 mg daily. He also takes a diuretic every other day. Over the past six months, he has experienced fluctuating blood pressure readings, with some readings as low as 90/60 mmHg and others around 130/58 mmHg.  He has COPD and uses two inhalers daily, Flovent and Spiriva, and reports worsening symptoms, including difficulty walking short distances such as to his mailbox. He recalls a previous trial of Breztri, which he found effective, but is unsure about insurance coverage for this medication.  He takes meloxicam for knee pain and has received knee injections in the past. He also takes oxycodone for back pain, which he reports has been worsening, and pantoprazole for stomach acid, which he receives from the [place].         02/19/2023   12:36 PM 12/31/2022    2:35 PM 05/27/2022   12:39 PM  Depression screen PHQ 2/9  Decreased Interest 0 3 1  Down, Depressed, Hopeless 0 3 0  PHQ - 2 Score 0 6 1  Altered sleeping  3   Tired, decreased energy  1   Change in appetite  1   Feeling bad or  failure about yourself   2   Trouble concentrating  3   Moving slowly or fidgety/restless  0   Suicidal thoughts  0   PHQ-9 Score  16        12/31/2022    2:35 PM 02/15/2021    9:53 AM 08/26/2018    1:46 PM 04/09/2018   11:18 AM  GAD 7 : Generalized Anxiety Score  Nervous, Anxious, on Edge 3 2 2 2   Control/stop worrying 3 1 3 2   Worry too much - different things 3 1 3 3   Trouble relaxing 3 1 3 3   Restless 3 1 3 3   Easily annoyed or irritable 3 1 0 3  Afraid - awful might happen 3 1 3 3   Total GAD 7 Score 21 8 17 19   Anxiety Difficulty  Not difficult at all Not difficult at all Not difficult at all    Social History   Tobacco Use   Smoking status: Every Day    Current packs/day: 1.00    Average packs/day: 1 pack/day for 70.0 years (70.0 ttl pk-yrs)    Types: Cigarettes   Smokeless tobacco: Never  Vaping Use   Vaping status: Never Used  Substance Use Topics   Alcohol use: No   Drug use: No    Review of Systems Per HPI unless specifically indicated above     Objective:    BP Marland Kitchen)  130/58   Pulse 67   Ht 5\' 11"  (1.803 m)   Wt 207 lb (93.9 kg)   SpO2 100%   BMI 28.87 kg/m   Wt Readings from Last 3 Encounters:  03/28/23 207 lb (93.9 kg)  03/26/23 190 lb (86.2 kg)  02/19/23 190 lb (86.2 kg)    Physical Exam Vitals and nursing note reviewed.  Constitutional:      General: He is not in acute distress.    Appearance: He is well-developed. He is not diaphoretic.     Comments: Well-appearing, comfortable, cooperative  HENT:     Head: Normocephalic and atraumatic.  Eyes:     General:        Right eye: No discharge.        Left eye: No discharge.     Conjunctiva/sclera: Conjunctivae normal.  Neck:     Thyroid: No thyromegaly.  Cardiovascular:     Rate and Rhythm: Normal rate and regular rhythm.     Pulses: Normal pulses.     Heart sounds: Normal heart sounds. No murmur heard. Pulmonary:     Effort: Pulmonary effort is normal. No respiratory distress.      Breath sounds: Normal breath sounds. No wheezing or rales.  Musculoskeletal:        General: Normal range of motion.     Cervical back: Normal range of motion and neck supple.  Lymphadenopathy:     Cervical: No cervical adenopathy.  Skin:    General: Skin is warm and dry.     Findings: No erythema or rash.  Neurological:     Mental Status: He is alert and oriented to person, place, and time. Mental status is at baseline.  Psychiatric:        Behavior: Behavior normal.     Comments: Well groomed, good eye contact, normal speech and thoughts     Results for orders placed or performed in visit on 03/21/23  PSA   Collection Time: 03/21/23  1:03 PM  Result Value Ref Range   Prostate Specific Ag, Serum 13.8 (H) 0.0 - 4.0 ng/mL      Assessment & Plan:   Problem List Items Addressed This Visit     Benign hypertension with CKD (chronic kidney disease) stage III (HCC)   Relevant Medications   furosemide (LASIX) 20 MG tablet   Other Relevant Orders   AMB Referral [facility] Care Management   Bilateral lower extremity edema   Relevant Medications   furosemide (LASIX) 20 MG tablet   Centrilobular emphysema (HCC) - Primary   Relevant Medications   BREZTRI AEROSPHERE 160-9-4.8 MCG/ACT AERO   Other Relevant Orders   AMB Referral [facility]I Care Management     Hypertension with LOW readings Recent low blood pressure readings, including 82/54 two days ago. Patient reports feeling dizzy prior to discontinuing medications. Currently off all medications and reports feeling better. -Discontinue Amlodipine 5mg  daily. -Continue Losartan 25mg  daily. -Monitor blood pressure at home.  Emphysema COPD Patient reports worsening symptoms, difficulty walking to mailbox due to shortness of breath. Currently on Flovent and Spiriva. -Order Breztri inhaler, 2 puffs twice daily. Once new inhaler is received, discontinue Flovent and Spiriva. -Continue to use Albuterol as a rescue inhaler. -Reconnect with  pharmacist Lanora Manis to assist with medication management, if he is unable to obtain the [facility] due to cost, or if there is a med assistance program eligibility  CHF / Edema Patient reports no current swelling, previously had issues with swelling.  Currently on Furosemide 20mg   every other day. -Change Furosemide 20mg  to as needed only for swelling.  Other medications Patient currently on Simvastatin, Meloxicam, Oxycodone, Pantoprazole. -Resume these medications gradually, monitoring for any side effects.  Follow-up Patient has an appointment scheduled in May. Plan to reassess at that time.      Route to [facility] to resume services, issues with med assistance for inhaler and polypharmacy, side effects  Orders Placed This Encounter  Procedures   AMB Referral [facility]BCI Care Management    Referral Priority:   Routine    Referral Type:   Consultation    Referral Reason:   Care Coordination    Number of Visits Requested:   1    Meds ordered this encounter  Medications   BREZTRI AEROSPHERE 160-9-4.8 MCG/ACT AERO    Sig: Inhale 2 puffs into the lungs 2 (two) times daily.    Dispense:  10.7 g    Refill:  11    Follow up plan: Return if symptoms worsen or fail to improve.   Saralyn Pilar, DO [facility] 03/28/2023, 2:15 PM

## 2023-03-28 NOTE — Patient Instructions (Addendum)
Thank you for coming to the office today.  NEW Inhaler - Breztri 2 puff twice a day - maintenance inhaler for COPD - Once you get the new inhaler - STOP the old ones - Flovent + Spiriva  Okay to still use Albuterol as a rescue.  For the BP - STOP taking Amlodipine 5mg  daily - Keep Losartan 25mg  daily for now. Monitor BP  All other meds okay to resume gradually - Simvastatin, Meloxicam, Oxycodone, Pantoprazole  For the fluid pill Furosemide 20mg  - I suggest as needed.   I will try to reconnect you with Gentry Fitz for the medications.   Please schedule a Follow-up Appointment to: Return if symptoms worsen or fail to improve.  If you have any other questions or concerns, please feel free to call the office or send a message through MyChart. You may also schedule an earlier appointment if necessary.  Additionally, you may be receiving a survey about your experience at our office within a few days to 1 week by e-mail or mail. We value your feedback.  Saralyn Pilar, DO [facility], [place]

## 2023-03-29 ENCOUNTER — Encounter: Payer: Self-pay | Admitting: Urology

## 2023-03-31 ENCOUNTER — Other Ambulatory Visit: Payer: Self-pay | Admitting: Family Medicine

## 2023-04-01 NOTE — Telephone Encounter (Signed)
Routing this to [place] for review for upcoming [facility] pharmacy referral.  I referred this patient because we can use some help with access to inhalers for his advanced COPD. He has been using Flovent + Spiriva without success. He has done well on Breztri sample. I just tried again to order Breztri and I don't see any denial or any call report, but all I see is now a request from pharmacy for Spiriva again.  This makes me suspicious that Markus Daft is not an option and they are only going back to the Spiriva.  Would you be able to investigate this and find out the situation with his COPD Maintenance inhaler access? I am not receiving good communication from patient and or pharmacy.  Thank you!  Saralyn Pilar, DO [facility] 04/01/2023, 5:09 PM

## 2023-04-01 NOTE — Telephone Encounter (Signed)
Requested medication (s) are due for refill today:   Requested medication (s) are on the active medication list: No  Last refill:    Future visit scheduled:   Notes to clinic:  Medication not on list.    Requested Prescriptions  Pending Prescriptions Disp Refills   SPIRIVA RESPIMAT 2.5 MCG/ACT AERS [Pharmacy Med Name: SPIRIVA RESPIMAT 2.5 MCG/ACT INH AE] 4 g     Sig: INHALE 2 PUFFS BY MOUTH INTO THE LUNGS DAILY     Pulmonology:  Anticholinergic Agents Passed - 04/01/2023  1:31 PM      Passed - Valid encounter within last 12 months    Recent Outpatient Visits           3 months ago Elevated PSA, between 10 and less than 20 ng/ml   [facility] Smitty Cords, DO   9 months ago Chronic pain syndrome   [place] Smitty Cords, DO   1 year ago Tinea pedis of right foot   [place] [facility] Smitty Cords, DO   1 year ago Benign hypertension with CKD (chronic kidney disease) stage III [facility])   [place] [facility] [place], Netta Neat, DO   2 years ago Chronic right-sided low back pain with bilateral sciatica   [place] [facility] Althea Charon, Netta Neat, DO       Future Appointments             In 3 months Althea Charon, Netta Neat, DO [facility]

## 2023-04-02 ENCOUNTER — Other Ambulatory Visit: Payer: 59 | Admitting: Pharmacist

## 2023-04-02 DIAGNOSIS — I129 Hypertensive chronic kidney disease with stage 1 through stage 4 chronic kidney disease, or unspecified chronic kidney disease: Secondary | ICD-10-CM

## 2023-04-02 DIAGNOSIS — J432 Centrilobular emphysema: Secondary | ICD-10-CM

## 2023-04-02 DIAGNOSIS — N183 Chronic kidney disease, stage 3 unspecified: Secondary | ICD-10-CM

## 2023-04-02 NOTE — Progress Notes (Signed)
04/02/2023 Name: Charles Ewing MRN: [id number] DOB: 1940-08-30  Chief Complaint  Patient presents with   Medication Adherence   Medication Management    Charles Ewing is a 83 y.o. year old male who presented for a telephone visit.   They were referred to the pharmacist by their PCP for assistance in managing hypertension and medication access.    Subjective:  Care Team: Primary Care Provider: Smitty Cords, DO ; Next Scheduled Visit: 07/01/2023 Cardiologist: Ladean Raya, PA  Pain Management: Delano Metz, MD Urologist: Riki Altes, MD  Medication Access/Adherence  Current Pharmacy:  [facility] - [place], [place] - [address] [place] [address] Phone: [phone] Fax: [phone]   Patient reports affordability concerns with their medications: No  Patient reports access/transportation concerns to their pharmacy: No  Patient reports adherence concerns with their medications:  No    Reports using weekly pillbox to organize his medications.   Hypertension:  Current medications:  - losartan 25 mg daily - furosemide 20 mg daily as needed  Medications previously tried: amlodipine (stopped as directed by PCP)  Patient has an automated, upper arm home BP cuff, but denies monitoring recently  Patient denies hypotensive s/sx including dizziness, lightheadedness since stopped amlodipine as directed by PCP    COPD:  Current medications: - Breztri 160-9-4.8 mcg/act - 2 puffs twice daily (plans to pick up and start taking as prescribed by PCP) - albuterol HFA 2 puffs every 6 hours as needed for wheezing or shortness of breath  Medications tried in the past: Spiriva (plans to stop with start of Breztri), Flovent    Tobacco Abuse:  Currently 1 pack/day  Denies interest in quitting smoking at this time   Objective:  Lab Results  Component Value Date   HGBA1C 5.9 (H) 06/28/2022    Lab  Results  Component Value Date   CREATININE 1.18 06/28/2022   BUN 18 06/28/2022   NA 138 06/28/2022   K 4.9 06/28/2022   CL 104 06/28/2022   CO2 28 06/28/2022    Lab Results  Component Value Date   CHOL 94 06/28/2022   HDL 50 06/28/2022   LDLCALC 28 06/28/2022   TRIG 81 06/28/2022   CHOLHDL 1.9 06/28/2022    Medications Reviewed Today     Reviewed by Manuela Neptune, RPH-CPP (Pharmacist) on 04/02/23 at 1706  Med List Status: <None>   Medication Order Taking? Sig Documenting Provider Last Dose Status Informant  albuterol (VENTOLIN HFA) 108 (90 Base) MCG/ACT inhaler [id number] Yes Inhale 2 puffs into the lungs every 6 (six) hours as needed for wheezing or shortness of breath. Smitty Cords, DO Taking Active   BREZTRI AEROSPHERE 160-9-4.8 MCG/ACT Sandrea Matte [id number]  Inhale 2 puffs into the lungs 2 (two) times daily. Smitty Cords, DO  Active   cyanocobalamin (VITAMIN B12) 1000 MCG tablet [id number]  Take 1,000 mcg by mouth daily. [provider]  Active   FLUoxetine (PROZAC) 20 MG capsule [id number]  TAKE ONE CAPSULE BY MOUTH TWICE A DAY Karamalegos, Alexander Shela Commons, DO  Active   fluticasone (FLONASE) 50 MCG/ACT nasal spray [id number]  PLACE 2 SPRAYS INTO BOTH NOSTRILS DAILY Karamalegos, Netta Neat, DO  Active   furosemide (LASIX) 20 MG tablet [id number]  Take 1 tablet (20 mg total) by mouth daily as needed. Smitty Cords, DO  Active   losartan (COZAAR) 25 MG tablet [id number]  TAKE 1 TABLET BY MOUTH DAILY Karamalegos, Netta Neat,  DO  Active   meloxicam (MOBIC) 15 MG tablet [id number]  TAKE 1 TABLET BY MOUTH DAILY AS NEEDED FOR PAIN Smitty Cords, DO  Active   oxyCODONE (OXY IR/ROXICODONE) 5 MG immediate release tablet [id number]  Take 1 tablet (5 mg total) by mouth 2 (two) times daily as needed for severe pain (pain score 7-10). Each refill must last 30 days. Delano Metz, MD  Active   oxyCODONE (OXY IR/ROXICODONE) 5 MG immediate release  tablet [id number]  Take 1 tablet (5 mg total) by mouth 2 (two) times daily as needed for severe pain (pain score 7-10). Each refill must last 30 days. Delano Metz, MD  Active   oxyCODONE (OXY IR/ROXICODONE) 5 MG immediate release tablet [id number]  Take 1 tablet (5 mg total) by mouth 2 (two) times daily as needed for severe pain (pain score 7-10). Each refill must last 30 days. Delano Metz, MD  Active            Med Note [facility], FRANCISCO A   Wed Feb 19, 2023  3:25 PM) WARNING: Future prescription, NOT a DUPLICATE. DO NOT DELETE. Do not delete during hospital medication reconciliation or at discharge. DO NOT LABEL as "Patient not taking".  pantoprazole (PROTONIX) 20 MG tablet [id number]  Take 1 tablet by mouth daily. [provider]  Active   simvastatin (ZOCOR) 20 MG tablet [id number]  Take 1 tablet (20 mg total) by mouth at bedtime. Smitty Cords, DO  Active   tamsulosin (FLOMAX) 0.4 MG CAPS capsule [id number]  TAKE 1 CAPSULE BY MOUTH DAILY AFTER BREAKFAST Althea Charon Netta Neat, DO  Active   Med List Note Valerie Salts, RN 02/19/23 1304): MR 06-01-2023 UDS 08-21-22              Assessment/Plan:   Encourage patient to continue using weekly pillbox  Hypertension: - Reviewed appropriate blood pressure monitoring technique and reviewed goal blood pressure. - Recommended to check home blood pressure and heart rate, keep log of results and have this record to review at upcoming medical appointments. Patient to contact provider office sooner if needed for readings outside of established parameters or symptoms   COPD: - Reviewed appropriate inhaler technique for Breztri, including importance of rinsing out mouth after each use. Also recommend patient have pharmacist at [facility] go over this device with him when he picks up   Tobacco Abuse - Denies interest in quitting at this time  Follow Up Plan: Clinical Pharmacist will follow up with patient by  telephone on 04/14/2023 at 10:00 AM   Estelle Grumbles, PharmD, Patsy [place], CPP Clinical Pharmacist [facility] [phone]

## 2023-04-02 NOTE — Patient Instructions (Signed)
Goals Addressed             This Visit's Progress    Pharmacy Goals       Check your blood pressure twice weekly, and any time you have concerning symptoms like headache, chest pain, dizziness, shortness of breath, or vision changes.   Our goal is less than 130/80.  To appropriately check your blood pressure, make sure you do the following:  1) Avoid caffeine, exercise, or tobacco products for 30 minutes before checking. Empty your bladder. 2) Sit with your back supported in a flat-backed chair. Rest your arm on something flat (arm of the chair, table, etc). 3) Sit still with your feet flat on the floor, resting, for at least 5 minutes.  4) Check your blood pressure. Take 1-2 readings.  5) Write down these readings and bring with you to any provider appointments.  Bring your home blood pressure machine with you to a provider's office for accuracy comparison at least once a year.   Make sure you take your blood pressure medications before you come to any office visit, even if you were asked to fast for labs.   Our goal bad cholesterol, or LDL, is less than 70 . This is why it is important to continue taking your simvastatin.  Reminders for using your Breztri inhaler:  Please remember to prime your inhaler before using it for the first time. To do this, remove the cap from the mouthpiece, hold it upright, facing away from you, and shake well. Push the canister all the way down until it stops moving in the actuator to release a puff of medicine from the mouthpiece. You may hear a soft click from the dose indicator as it counts down. Repeat this process three times more.  To use the inhaler, take the cap off and shake the primed inhaler. Exhale as much as you reasonably can through your mouth, then close your lips around the mouthpiece and tilt your head back, keeping your tongue below the mouthpiece. Inhale deeply and slowly while pressing down on the canister until it stops moving. You  should feel a puff of medicine. Remove the inhaler while holding your breath for as long as you comfortably can, up to 10 seconds, before breathing out gently. Repeat this process once more and replace the cap. You should be taking 2 puffs, 2 times a day. After your second puff, rinse your mouth with water to remove any excess medicine, making sure you don't swallow the water.   Thank you!  Estelle Grumbles, PharmD, Patsy [place], CPP Clinical Pharmacist [facility] [phone]

## 2023-04-14 ENCOUNTER — Other Ambulatory Visit (INDEPENDENT_AMBULATORY_CARE_PROVIDER_SITE_OTHER): Payer: 59 | Admitting: Pharmacist

## 2023-04-14 DIAGNOSIS — N183 Chronic kidney disease, stage 3 unspecified: Secondary | ICD-10-CM

## 2023-04-14 DIAGNOSIS — I129 Hypertensive chronic kidney disease with stage 1 through stage 4 chronic kidney disease, or unspecified chronic kidney disease: Secondary | ICD-10-CM

## 2023-04-14 DIAGNOSIS — J432 Centrilobular emphysema: Secondary | ICD-10-CM

## 2023-04-14 NOTE — Progress Notes (Signed)
 04/14/2023 Name: Charles Ewing MRN: [id number] DOB: [birth year]  Chief Complaint  Patient presents with   Medication Adherence   Medication Management    Charles Ewing is a 83 y.o. year old male who presented for a telephone visit.   They were referred to the pharmacist by their PCP for assistance in managing hypertension and medication access.      Subjective:   Care Team: Primary Care Provider: Smitty Cords, DO ; Next Scheduled Visit: 07/01/2023 Cardiologist: Ladean Raya, PA  Pain Management: Delano Metz, MD; Next Scheduled Visit: 05/28/2023 Urologist: Riki Altes, MD; Next Scheduled Visit: 05/01/2023  Medication Access/Adherence  Current Pharmacy:  [facility] - [place], [place] - [address] [place] [address] Phone: [phone] Fax: [phone]   Patient reports affordability concerns with their medications: No  Patient reports access/transportation concerns to their pharmacy: No  Patient reports adherence concerns with their medications:  No     Reports using weekly pillbox to organize his medications.    Hypertension:   Current medications:  - losartan 25 mg daily - furosemide 20 mg daily as needed (reports taking ~ every other day)   Medications previously tried: amlodipine (stopped as directed by PCP)   Patient has an automated, upper arm home BP cuff Reports last checked: - Today: 147/77, HR 70 (checked right after coffee and cigarette this morning) - Reports recent readings ranging: 124-153/70-85   Patient questions the accuracy of his BP monitor; considering obtaining a new one  Reports mostly eats high-sodium foods (frozen dinners and fast food)  Patient denies hypotensive s/sx including dizziness, lightheadedness since stopped amlodipine as directed by PCP      COPD:   Current medications: - Breztri 160-9-4.8 mcg/act - 2 puffs twice daily  - albuterol HFA 2 puffs every  6 hours as needed for wheezing or shortness of breath   Medications tried in the past: Spiriva (plans to stop with start of Breztri), Flovent   Reports has noticed improvement in his breathing since started [place]. Reports using consistently twice daily and rinsing out his mouth after each use     Tobacco Abuse:   Currently 1 pack/day  Denies interest in quitting smoking at this time   Objective:  Lab Results  Component Value Date   HGBA1C 5.9 (H) 06/28/2022    Lab Results  Component Value Date   CREATININE 1.18 06/28/2022   BUN 18 06/28/2022   NA 138 06/28/2022   K 4.9 06/28/2022   CL 104 06/28/2022   CO2 28 06/28/2022    Lab Results  Component Value Date   CHOL 94 06/28/2022   HDL 50 06/28/2022   LDLCALC 28 06/28/2022   TRIG 81 06/28/2022   CHOLHDL 1.9 06/28/2022   BP Readings from Last 3 Encounters:  03/28/23 (!) 130/58  03/26/23 (!) 82/54  02/19/23 (!) 110/57   Pulse Readings from Last 3 Encounters:  03/28/23 67  03/26/23 93  11/25/22 75     Medications Reviewed Today     Reviewed by Manuela Neptune, RPH-CPP (Pharmacist) on 04/14/23 at 1052  Med List Status: <None>   Medication Order Taking? Sig Documenting Provider Last Dose Status Informant  albuterol (VENTOLIN HFA) 108 (90 Base) MCG/ACT inhaler [id number]  Inhale 2 puffs into the lungs every 6 (six) hours as needed for wheezing or shortness of breath. Smitty Cords, DO  Active   BREZTRI AEROSPHERE 160-9-4.8 MCG/ACT AERO [id number] Yes Inhale 2  puffs into the lungs 2 (two) times daily. Smitty Cords, DO Taking Active   cyanocobalamin (VITAMIN B12) 1000 MCG tablet [id number]  Take 1,000 mcg by mouth daily. [provider]  Active   FLUoxetine (PROZAC) 20 MG capsule [id number]  TAKE ONE CAPSULE BY MOUTH TWICE A DAY Karamalegos, Alexander Shela Commons, DO  Active   fluticasone (FLONASE) 50 MCG/ACT nasal spray [id number]  PLACE 2 SPRAYS INTO BOTH NOSTRILS DAILY Karamalegos, Netta Neat,  DO  Active   furosemide (LASIX) 20 MG tablet [id number] Yes Take 1 tablet (20 mg total) by mouth daily as needed. Smitty Cords, DO Taking Active   losartan (COZAAR) 25 MG tablet [id number] Yes TAKE 1 TABLET BY MOUTH DAILY Smitty Cords, DO Taking Active   meloxicam (MOBIC) 15 MG tablet [id number]  TAKE 1 TABLET BY MOUTH DAILY AS NEEDED FOR PAIN Smitty Cords, DO  Active   oxyCODONE (OXY IR/ROXICODONE) 5 MG immediate release tablet [id number]  Take 1 tablet (5 mg total) by mouth 2 (two) times daily as needed for severe pain (pain score 7-10). Each refill must last 30 days. Delano Metz, MD  Expired 04/02/23 2359   oxyCODONE (OXY IR/ROXICODONE) 5 MG immediate release tablet [id number]  Take 1 tablet (5 mg total) by mouth 2 (two) times daily as needed for severe pain (pain score 7-10). Each refill must last 30 days. Delano Metz, MD  Active   oxyCODONE (OXY IR/ROXICODONE) 5 MG immediate release tablet [id number]  Take 1 tablet (5 mg total) by mouth 2 (two) times daily as needed for severe pain (pain score 7-10). Each refill must last 30 days. Delano Metz, MD  Active            Med Note [place], FRANCISCO A   Wed Feb 19, 2023  3:25 PM) WARNING: Future prescription, NOT a DUPLICATE. DO NOT DELETE. Do not delete during hospital medication reconciliation or at discharge. DO NOT LABEL as "Patient not taking".  pantoprazole (PROTONIX) 20 MG tablet [id number]  Take 1 tablet by mouth daily. [provider]  Active   simvastatin (ZOCOR) 20 MG tablet [id number]  Take 1 tablet (20 mg total) by mouth at bedtime. Smitty Cords, DO  Active   tamsulosin (FLOMAX) 0.4 MG CAPS capsule [id number]  TAKE 1 CAPSULE BY MOUTH DAILY AFTER BREAKFAST Althea Charon Netta Neat, DO  Active   Med List Note Valerie Salts, RN 02/19/23 1304): MR 06-01-2023 UDS 08-21-22              Assessment/Plan:   Encourage patient to continue using weekly pillbox    Hypertension: - Reviewed appropriate blood pressure monitoring technique and reviewed goal blood pressure. Remind patient to wait at least 30 minutes after caffeine and nicotine before checking his blood pressure - Recommended to check home blood pressure and heart rate, keep log of results and have this record to review at upcoming medical appointments. Patient to contact provider office sooner if needed for readings outside of established parameters or symptoms     COPD: - Reviewed appropriate inhaler technique for Breztri, including importance of rinsing out mouth after each use. Also recommend patient have pharmacist at [facility] go over this device with him when he picks up     Tobacco Abuse - Denies interest in quitting at this time   Patient denies further medication questions or concerns today Provide patient with contact information for clinic pharmacist to contact if needed in future for medication questions/concerns    Estelle Grumbles,  PharmD, Patsy [place], CPP Clinical Pharmacist [facility] [phone]

## 2023-04-14 NOTE — Patient Instructions (Signed)
 Goals Addressed             This Visit's Progress    Pharmacy Goals       Check your blood pressure twice weekly, and any time you have concerning symptoms like headache, chest pain, dizziness, shortness of breath, or vision changes.   Our goal is less than 130/80.  To appropriately check your blood pressure, make sure you do the following:  1) Avoid caffeine, exercise, or tobacco products for 30 minutes before checking. Empty your bladder. 2) Sit with your back supported in a flat-backed chair. Rest your arm on something flat (arm of the chair, table, etc). 3) Sit still with your feet flat on the floor, resting, for at least 5 minutes.  4) Check your blood pressure. Take 1-2 readings.  5) Write down these readings and bring with you to any provider appointments.  Bring your home blood pressure machine with you to a provider's office for accuracy comparison at least once a year.   Make sure you take your blood pressure medications before you come to any office visit, even if you were asked to fast for labs.   Our goal bad cholesterol, or LDL, is less than 70 . This is why it is important to continue taking your simvastatin.  Reminders for using your Breztri inhaler:  Please remember to prime your inhaler before using it for the first time. To do this, remove the cap from the mouthpiece, hold it upright, facing away from you, and shake well. Push the canister all the way down until it stops moving in the actuator to release a puff of medicine from the mouthpiece. You may hear a soft click from the dose indicator as it counts down. Repeat this process three times more.  To use the inhaler, take the cap off and shake the primed inhaler. Exhale as much as you reasonably can through your mouth, then close your lips around the mouthpiece and tilt your head back, keeping your tongue below the mouthpiece. Inhale deeply and slowly while pressing down on the canister until it stops moving. You  should feel a puff of medicine. Remove the inhaler while holding your breath for as long as you comfortably can, up to 10 seconds, before breathing out gently. Repeat this process once more and replace the cap. You should be taking 2 puffs, 2 times a day. After your second puff, rinse your mouth with water to remove any excess medicine, making sure you don't swallow the water.   Thank you!  Estelle Grumbles, PharmD, Patsy [place], CPP Clinical Pharmacist [facility] [phone]

## 2023-04-17 ENCOUNTER — Other Ambulatory Visit: Payer: Self-pay | Admitting: Family Medicine

## 2023-04-17 ENCOUNTER — Other Ambulatory Visit: Payer: Self-pay | Admitting: Internal Medicine

## 2023-04-17 DIAGNOSIS — R600 Localized edema: Secondary | ICD-10-CM

## 2023-04-17 DIAGNOSIS — E785 Hyperlipidemia, unspecified: Secondary | ICD-10-CM

## 2023-04-17 DIAGNOSIS — G8929 Other chronic pain: Secondary | ICD-10-CM

## 2023-04-17 DIAGNOSIS — I129 Hypertensive chronic kidney disease with stage 1 through stage 4 chronic kidney disease, or unspecified chronic kidney disease: Secondary | ICD-10-CM

## 2023-04-18 NOTE — Telephone Encounter (Signed)
 Requested medication (s) are due for refill today: Yes  Requested medication (s) are on the active medication list: Yes  Last refill:  10/15/22  Future visit scheduled: Yes  Notes to clinic:  Manual review.    Requested Prescriptions  Pending Prescriptions Disp Refills   meloxicam (MOBIC) 15 MG tablet [Pharmacy Med Name: MELOXICAM 15 MG TAB] 90 tablet 1    Sig: TAKE 1 TABLET BY MOUTH DAILY AS NEEDED FOR PAIN     Analgesics:  COX2 Inhibitors Failed - 04/18/2023  9:38 AM      Failed - Manual Review: Labs are only required if the patient has taken medication for more than 8 weeks.      Failed - HGB in normal range and within 360 days    Hemoglobin  Date Value Ref Range Status  06/28/2022 11.2 (L) 13.2 - 17.1 g/dL Final  14/78/2956 21.3 12.6 - 17.7 g/dL Final         Failed - HCT in normal range and within 360 days    HCT  Date Value Ref Range Status  06/28/2022 36.0 (L) 38.5 - 50.0 % Final   Hematocrit  Date Value Ref Range Status  05/19/2015 44.5 37.5 - 51.0 % Final         Failed - ALT in normal range and within 360 days    ALT  Date Value Ref Range Status  06/28/2022 8 (L) 9 - 46 U/L Final   SGPT (ALT)  Date Value Ref Range Status  10/15/2013 27 U/L Final    Comment:    14-63 NOTE: New Reference Range 08/31/13          Passed - Cr in normal range and within 360 days    Creat  Date Value Ref Range Status  06/28/2022 1.18 0.70 - 1.22 mg/dL Final         Passed - AST in normal range and within 360 days    AST  Date Value Ref Range Status  06/28/2022 13 10 - 35 U/L Final   SGOT(AST)  Date Value Ref Range Status  10/15/2013 15 15 - 37 Unit/L Final         Passed - eGFR is 30 or above and within 360 days    GFR, Est African American  Date Value Ref Range Status  01/11/2019 64 > OR = 60 mL/min/1.99m2 Final   GFR, Est Non African American  Date Value Ref Range Status  01/11/2019 55 (L) > OR = 60 mL/min/1.77m2 Final   GFR, Estimated  Date Value Ref  Range Status  12/23/2020 >60 >60 mL/min Final    Comment:    (NOTE) Calculated using the CKD-EPI Creatinine Equation (2021)    eGFR  Date Value Ref Range Status  06/28/2022 62 > OR = 60 mL/min/1.58m2 Final  06/13/2021 62 >59 mL/min/1.73 Final         Passed - Patient is not pregnant      Passed - Valid encounter within last 12 months    Recent Outpatient Visits           3 months ago Elevated PSA, between 10 and less than 20 ng/ml   [facility] Smitty Cords, DO   9 months ago Chronic pain syndrome   [place] [facility] Smitty Cords, DO   1 year ago Tinea pedis of right foot   [facility] Smitty Cords, DO   1  year ago Benign hypertension with CKD (chronic kidney disease) stage III [facility])   [facility] [place] [facility], Netta Neat, DO   2 years ago Chronic right-sided low back pain with bilateral sciatica   [place] [facility], Netta Neat, DO       Future Appointments             In 2 months Althea Charon, Netta Neat, DO [facility] [place]            Signed Prescriptions Disp Refills   simvastatin (ZOCOR) 20 MG tablet 90 tablet 1    Sig: TAKE 1 TABLET BY MOUTH AT BEDTIME     Cardiovascular:  Antilipid - Statins Failed - 04/18/2023  9:38 AM      Failed - Lipid Panel in normal range within the last 12 months    Cholesterol  Date Value Ref Range Status  06/28/2022 94 <200 mg/dL Final   LDL Cholesterol (Calc)  Date Value Ref Range Status  06/28/2022 28 mg/dL (calc) Final    Comment:    Reference range: <100 . Desirable range <100 mg/dL for primary prevention;   <70 mg/dL for patients with CHD or diabetic patients  with > or = 2 CHD risk factors. Marland Kitchen LDL-C is now calculated using the Martin-Hopkins  calculation, which is a validated novel method providing  better accuracy  than the Friedewald equation in the  estimation of LDL-C.  Horald Pollen et al. Lenox Ahr. 4098;119(14): 2061-2068  (http://education.[employer].com/faq/FAQ164)    HDL  Date Value Ref Range Status  06/28/2022 50 > OR = 40 mg/dL Final   Triglycerides  Date Value Ref Range Status  06/28/2022 81 <150 mg/dL Final         Passed - Patient is not pregnant      Passed - Valid encounter within last 12 months    Recent Outpatient Visits           3 months ago Elevated PSA, between 10 and less than 20 ng/ml   [facility] [facility] Smitty Cords, DO   9 months ago Chronic pain syndrome   [place] [facility] Smitty Cords, DO   1 year ago Tinea pedis of right foot   [facility] Smitty Cords, DO   1 year ago Benign hypertension with CKD (chronic kidney disease) stage III [facility])   [facility] [place], Netta Neat, DO   2 years ago Chronic right-sided low back pain with bilateral sciatica   [place] [facility] [place], Netta Neat, DO       Future Appointments             In 2 months Althea Charon, Netta Neat, DO  [facility]

## 2023-04-18 NOTE — Telephone Encounter (Signed)
 Dc'd (changed to prn) on 03/28/23 Order Status Reason By On  Discontinued None Smitty Cords, DO 03/28/23 1430   Requested Prescriptions  Refused Prescriptions Disp Refills   furosemide (LASIX) 20 MG tablet [Pharmacy Med Name: FUROSEMIDE 20 MG TAB] 45 tablet     Sig: TAKE 1 TABLET BY MOUTH EVERY OTHER DAY     Cardiovascular:  Diuretics - Loop Failed - 04/18/2023  9:43 AM      Failed - K in normal range and within 180 days    Potassium  Date Value Ref Range Status  06/28/2022 4.9 3.5 - 5.3 mmol/L Final  10/16/2013 3.8 3.5 - 5.1 mmol/L Final         Failed - Ca in normal range and within 180 days    Calcium  Date Value Ref Range Status  06/28/2022 8.8 8.6 - 10.3 mg/dL Final   Calcium, Total  Date Value Ref Range Status  10/16/2013 8.0 (L) 8.5 - 10.1 mg/dL Final         Failed - Na in normal range and within 180 days    Sodium  Date Value Ref Range Status  06/28/2022 138 135 - 146 mmol/L Final  06/13/2021 138 134 - 144 mmol/L Final  10/16/2013 141 136 - 145 mmol/L Final         Failed - Cr in normal range and within 180 days    Creat  Date Value Ref Range Status  06/28/2022 1.18 0.70 - 1.22 mg/dL Final         Failed - Cl in normal range and within 180 days    Chloride  Date Value Ref Range Status  06/28/2022 104 98 - 110 mmol/L Final  10/16/2013 107 98 - 107 mmol/L Final         Failed - Mg Level in normal range and within 180 days    Magnesium  Date Value Ref Range Status  06/13/2021 2.3 1.6 - 2.3 mg/dL Final  16/11/9602 1.8 mg/dL Final    Comment:    5.4-0.9 THERAPEUTIC RANGE: 4-7 mg/dL TOXIC: > 10 mg/dL  -----------------------          Passed - Last BP in normal range    BP Readings from Last 1 Encounters:  03/28/23 (!) 130/58         Passed - Valid encounter within last 6 months    Recent Outpatient Visits           3 months ago Elevated PSA, between 10 and less than 20 ng/ml   [facility] Smitty Cords, DO   9 months ago Chronic pain syndrome   [place] [facility] Smitty Cords, DO   1 year ago Tinea pedis of right foot   [place] [facility] Smitty Cords, DO   1 year ago Benign hypertension with CKD (chronic kidney disease) stage III [facility])   [place] [facility] Smitty Cords, DO   2 years ago Chronic right-sided low back pain with bilateral sciatica   [place] [facility] Smitty Cords, DO       Future Appointments             In 2 months Althea Charon, Netta Neat, DO [place] [facility], [facility]

## 2023-04-18 NOTE — Telephone Encounter (Signed)
 Requested Prescriptions  Pending Prescriptions Disp Refills   simvastatin (ZOCOR) 20 MG tablet [Pharmacy Med Name: SIMVASTATIN 20 MG TAB] 90 tablet 1    Sig: TAKE 1 TABLET BY MOUTH AT BEDTIME     Cardiovascular:  Antilipid - Statins Failed - 04/18/2023  9:37 AM      Failed - Lipid Panel in normal range within the last 12 months    Cholesterol  Date Value Ref Range Status  06/28/2022 94 <200 mg/dL Final   LDL Cholesterol (Calc)  Date Value Ref Range Status  06/28/2022 28 mg/dL (calc) Final    Comment:    Reference range: <100 . Desirable range <100 mg/dL for primary prevention;   <70 mg/dL for patients with CHD or diabetic patients  with > or = 2 CHD risk factors. [place] [facility] LDL-C is now calculated using the Martin-Hopkins  calculation, which is a validated novel method providing  better accuracy than the Friedewald equation in the  estimation of LDL-C.  Horald Pollen et al. Lenox Ahr. 1610;960(45): 2061-2068  (http://education.[employer].com/faq/FAQ164)    HDL  Date Value Ref Range Status  06/28/2022 50 > OR = 40 mg/dL Final   Triglycerides  Date Value Ref Range Status  06/28/2022 81 <150 mg/dL Final         Passed - Patient is not pregnant      Passed - Valid encounter within last 12 months    Recent Outpatient Visits           3 months ago Elevated PSA, between 10 and less than 20 ng/ml   [place] [facility] Smitty Cords, DO   9 months ago Chronic pain syndrome   [place] [facility] Smitty Cords, DO   1 year ago Tinea pedis of right foot   [place] [facility] [place], Netta Neat, DO   1 year ago Benign hypertension with CKD (chronic kidney disease) stage III [facility])   [place] [facility] [place], Netta Neat, DO   2 years ago Chronic right-sided low back pain with bilateral sciatica   [place] [facility] [place], Netta Neat, DO        Future Appointments             In 2 months Althea Charon, Netta Neat, DO [place], [facility]             meloxicam (MOBIC) 15 MG tablet [Pharmacy Med Name: MELOXICAM 15 MG TAB] 90 tablet 1    Sig: TAKE 1 TABLET BY MOUTH DAILY AS NEEDED FOR PAIN     Analgesics:  COX2 Inhibitors Failed - 04/18/2023  9:37 AM      Failed - Manual Review: Labs are only required if the patient has taken medication for more than 8 weeks.      Failed - HGB in normal range and within 360 days    Hemoglobin  Date Value Ref Range Status  06/28/2022 11.2 (L) 13.2 - 17.1 g/dL Final  40/98/1191 47.8 12.6 - 17.7 g/dL Final         Failed - HCT in normal range and within 360 days    HCT  Date Value Ref Range Status  06/28/2022 36.0 (L) 38.5 - 50.0 % Final   Hematocrit  Date Value Ref Range Status  05/19/2015 44.5 37.5 - 51.0 % Final         Failed - ALT in normal range and within 360  days    ALT  Date Value Ref Range Status  06/28/2022 8 (L) 9 - 46 U/L Final   SGPT (ALT)  Date Value Ref Range Status  10/15/2013 27 U/L Final    Comment:    14-63 NOTE: New Reference Range 08/31/13          Passed - Cr in normal range and within 360 days    Creat  Date Value Ref Range Status  06/28/2022 1.18 0.70 - 1.22 mg/dL Final         Passed - AST in normal range and within 360 days    AST  Date Value Ref Range Status  06/28/2022 13 10 - 35 U/L Final   SGOT(AST)  Date Value Ref Range Status  10/15/2013 15 15 - 37 Unit/L Final         Passed - eGFR is 30 or above and within 360 days    GFR, Est African American  Date Value Ref Range Status  01/11/2019 64 > OR = 60 mL/min/1.63m2 Final   GFR, Est Non African American  Date Value Ref Range Status  01/11/2019 55 (L) > OR = 60 mL/min/1.82m2 Final   GFR, Estimated  Date Value Ref Range Status  12/23/2020 >60 >60 mL/min Final    Comment:    (NOTE) Calculated using the CKD-EPI Creatinine Equation (2021)    eGFR   Date Value Ref Range Status  06/28/2022 62 > OR = 60 mL/min/1.64m2 Final  06/13/2021 62 >59 mL/min/1.73 Final         Passed - Patient is not pregnant      Passed - Valid encounter within last 12 months    Recent Outpatient Visits           3 months ago Elevated PSA, between 10 and less than 20 ng/ml   [place] [facility] Smitty Cords, DO   9 months ago Chronic pain syndrome   [place] [facility] Smitty Cords, DO   1 year ago Tinea pedis of right foot   [place] [facility] [place], Netta Neat, DO   1 year ago Benign hypertension with CKD (chronic kidney disease) stage III [facility])   [place] [facility] [place], Netta Neat, DO   2 years ago Chronic right-sided low back pain with bilateral sciatica   [place] [facility] [place], Netta Neat, DO       Future Appointments             In 2 months Althea Charon, Netta Neat, DO [place] [facility], [facility]

## 2023-04-21 ENCOUNTER — Other Ambulatory Visit: Payer: Self-pay | Admitting: Family Medicine

## 2023-04-21 DIAGNOSIS — R600 Localized edema: Secondary | ICD-10-CM

## 2023-04-21 DIAGNOSIS — I129 Hypertensive chronic kidney disease with stage 1 through stage 4 chronic kidney disease, or unspecified chronic kidney disease: Secondary | ICD-10-CM

## 2023-04-21 MED ORDER — FUROSEMIDE 20 MG PO TABS: 20.0000 mg | ORAL_TABLET | Freq: Every day | ORAL | 0 refills | Status: AC | PRN

## 2023-04-21 NOTE — Telephone Encounter (Signed)
 Copied from CRM [id number]. Topic: Clinical - Medication Refill >> Apr 21, 2023 12:10 PM Almira Coaster wrote: Most Recent Primary Care Visit:  Provider: Estelle Grumbles A  Department: [facility]  Visit Type: PATIENT OUTREACH 30  Date: 04/14/2023  Medication: furosemide (LASIX) 20 MG tablet   Has the patient contacted their pharmacy? Yes, pharmacy is calling to place refill request (Agent: If no, request that the patient contact the pharmacy for the refill. If patient does not wish to contact the pharmacy document the reason why and proceed with request.) (Agent: If yes, when and what did the pharmacy advise?)  Is this the correct pharmacy for this prescription? Yes If no, delete pharmacy and type the correct one.  This is the patient's preferred pharmacy:  [facility] - [place], [place] - [address] [place] [address] Phone: [phone] Fax: [phone]   Has the prescription been filled recently? Yes  Is the patient out of the medication? Yes  Has the patient been seen for an appointment in the last year OR does the patient have an upcoming appointment? Yes  Can we respond through MyChart? No  Agent: Please be advised that Rx refills may take up to 3 business days. We ask that you follow-up with your pharmacy.

## 2023-05-01 ENCOUNTER — Encounter: Payer: Self-pay | Admitting: Urology

## 2023-05-01 ENCOUNTER — Ambulatory Visit: Payer: 59 | Admitting: Urology

## 2023-05-01 VITALS — BP 79/48 | HR 73 | Ht 71.0 in | Wt 207.0 lb

## 2023-05-01 DIAGNOSIS — R972 Elevated prostate specific antigen [PSA]: Secondary | ICD-10-CM | POA: Diagnosis not present

## 2023-05-01 DIAGNOSIS — N4231 Prostatic intraepithelial neoplasia: Secondary | ICD-10-CM | POA: Diagnosis not present

## 2023-05-01 DIAGNOSIS — Z2989 Encounter for other specified prophylactic measures: Secondary | ICD-10-CM | POA: Diagnosis not present

## 2023-05-01 DIAGNOSIS — C61 Malignant neoplasm of prostate: Secondary | ICD-10-CM

## 2023-05-01 MED ORDER — LEVOFLOXACIN 500 MG PO TABS
500.0000 mg | ORAL_TABLET | Freq: Once | ORAL | Status: AC
  Administered 2023-05-01: 500 mg via ORAL

## 2023-05-01 MED ORDER — GENTAMICIN SULFATE 40 MG/ML IJ SOLN
80.0000 mg | Freq: Once | INTRAMUSCULAR | Status: AC
  Administered 2023-05-01: 80 mg via INTRAMUSCULAR

## 2023-05-01 NOTE — Progress Notes (Unsigned)
 Prostate Biopsy Procedure   Informed consent was obtained after discussing risks/benefits of the procedure.  A time out was performed to ensure correct patient identity.  Pre-Procedure: - Last PSA Level: Rising PSA-PSA 13.8 Lab Results  Component Value Date   PSA 11.48 (H) 06/28/2022   PSA 8.29 (H) 10/30/2020   PSA 6.8 (H) 01/11/2019   - Gentamicin given prophylactically - Levaquin 500 mg administered PO -Transrectal Ultrasound performed revealing a *** gm prostate -No significant hypoechoic or median lobe noted  Procedure: - Prostate block performed using 10 cc 1% lidocaine and biopsies taken from sextant areas, a total of 12 under ultrasound guidance.  Post-Procedure: - Patient tolerated the procedure well - He was counseled to seek immediate medical attention if experiences any severe pain, significant bleeding, or fevers - Return in one week to discuss biopsy results

## 2023-05-12 ENCOUNTER — Encounter: Payer: Self-pay | Admitting: Urology

## 2023-05-12 ENCOUNTER — Ambulatory Visit (INDEPENDENT_AMBULATORY_CARE_PROVIDER_SITE_OTHER): Admitting: Urology

## 2023-05-12 VITALS — BP 76/43 | HR 87 | Ht 71.5 in | Wt 190.0 lb

## 2023-05-12 DIAGNOSIS — C61 Malignant neoplasm of prostate: Secondary | ICD-10-CM | POA: Diagnosis not present

## 2023-05-12 NOTE — Progress Notes (Signed)
 I, Maysun Anabel Bene, acting as a scribe for Riki Altes, MD., have documented all relevant documentation on the behalf of Riki Altes, MD, as directed by Riki Altes, MD while in the presence of Riki Altes, MD.  05/12/2023 7:33 PM   [name] March 05, 1940 [id number]  Referring provider: Smitty Cords, DO [address] [place],  [place] [address]  Chief Complaint  Patient presents with   Results   Urologic history: 1. Elevated PSA  PSA has been as high as 8.19 February 2015.  PSA in 2020 was 6.8 and 2022 was 8.29. Last PSA May 2024 was 11.48    HPI: Charles Ewing is a 83 y.o. male presents for prostate biopsy follow-up.   TRUS biopsy performed 05/01/2023 for a PSA of 13.8 and prostate MRI showing a PIRADS IV lesion in the right PZ. Underwent cognitive biopsy and a hypoechoic area noted on ultrasound in the MRI abnormality, for which the patient was concerned. Right apical biopsies included cores from this area.  No post-biopsy complaints.  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 5 additional right template cores showed no significant side effects. High-grade PIN periods 6/6 left side cores were benign.  PSA trend  Prostate Specific Ag, Serum  Latest Ref Rng 0.0 - 4.0 ng/mL  09/23/2022 13.2 (H)   03/21/2023 13.8 (H)      PMH: Past Medical History:  Diagnosis Date   Abdominal aortic aneurysm (AAA) (HCC) 2013   [facility]-   Anxiety    Arrhythmia    Arthritis    Colon polyp    COPD (chronic obstructive pulmonary disease) (HCC)    Coronary artery disease    Depression    GERD (gastroesophageal reflux disease)    Hyperlipidemia    Hypertension    Skin cancer    Thrush     Surgical History: Past Surgical History:  Procedure Laterality Date   CATARACT EXTRACTION W/PHACO Left 05/09/2020   Procedure: CATARACT EXTRACTION PHACO AND INTRAOCULAR LENS PLACEMENT (IOC) LEFT DIABETIC 9.09 01:02.0;   Surgeon: Galen Manila, MD;  [facility];  Service: Ophthalmology;  Laterality: Left;   CATARACT EXTRACTION W/PHACO Right 05/23/2020   Procedure: CATARACT EXTRACTION PHACO AND INTRAOCULAR LENS PLACEMENT (IOC) RIGHT DIABETIC 8.13 01:00.3;  Surgeon: Galen Manila, MD;  [facility];  Service: Ophthalmology;  Laterality: Right;   Colon polyp surgery     SKIN CANCER EXCISION     TRANSURETHRAL RESECTION OF PROSTATE  2010    Home Medications:  Allergies as of 05/12/2023       Reactions   Nexium [esomeprazole Magnesium]         Medication List        Accurate as of May 12, 2023  7:33 PM. If you have any questions, ask your nurse or doctor.          albuterol 108 (90 Base) MCG/ACT inhaler Commonly known as: VENTOLIN HFA Inhale 2 puffs into the lungs every 6 (six) hours as needed for wheezing or shortness of breath.   Breztri Aerosphere 160-9-4.8 MCG/ACT Aero Generic drug: budeson-glycopyrrolate-formoterol Inhale 2 puffs into the lungs 2 (two) times daily.   cyanocobalamin 1000 MCG tablet Commonly known as: VITAMIN B12 Take 1,000 mcg by mouth daily.   FLUoxetine 20 MG capsule Commonly known as: PROZAC TAKE ONE CAPSULE BY MOUTH TWICE A DAY   fluticasone 50 MCG/ACT nasal spray Commonly known as:  FLONASE PLACE 2 SPRAYS INTO BOTH NOSTRILS DAILY   furosemide 20 MG tablet Commonly known as: LASIX Take 1 tablet (20 mg total) by mouth daily as needed.   losartan 25 MG tablet Commonly known as: COZAAR TAKE 1 TABLET BY MOUTH DAILY   meloxicam 15 MG tablet Commonly known as: MOBIC TAKE 1 TABLET BY MOUTH DAILY AS NEEDED FOR PAIN   oxyCODONE 5 MG immediate release tablet Commonly known as: Oxy IR/ROXICODONE Take 1 tablet (5 mg total) by mouth 2 (two) times daily as needed for severe pain (pain score 7-10). Each refill must last 30 days.   oxyCODONE 5 MG immediate release tablet Commonly known as: Oxy IR/ROXICODONE Take 1 tablet (5  mg total) by mouth 2 (two) times daily as needed for severe pain (pain score 7-10). Each refill must last 30 days.   oxyCODONE 5 MG immediate release tablet Commonly known as: Oxy IR/ROXICODONE Take 1 tablet (5 mg total) by mouth 2 (two) times daily as needed for severe pain (pain score 7-10). Each refill must last 30 days.   pantoprazole 20 MG tablet Commonly known as: PROTONIX Take 1 tablet by mouth daily.   simvastatin 20 MG tablet Commonly known as: ZOCOR TAKE 1 TABLET BY MOUTH AT BEDTIME   tamsulosin 0.4 MG Caps capsule Commonly known as: FLOMAX TAKE 1 CAPSULE BY MOUTH DAILY AFTER BREAKFAST        Allergies:  Allergies  Allergen Reactions   Nexium [Esomeprazole Magnesium]     Family History: Family History  Problem Relation Age of Onset   Kidney cancer Mother    Prostate cancer Neg Hx    Bladder Cancer Neg Hx     Social History:  reports that he has been smoking cigarettes. He has a 70 pack-year smoking history. He has never used smokeless tobacco. He reports that he does not drink alcohol and does not use drugs.   Physical Exam: BP (!) 76/43   Pulse 87   Ht 5' 11.5" (1.816 m)   Wt 190 lb (86.2 kg)   BMI 26.13 kg/m   Constitutional:  Alert and oriented, No acute distress. HEENT: [place] AT, moist mucus membranes.  Trachea midline, no masses. Cardiovascular: No clubbing, cyanosis, or edema. Respiratory: Normal respiratory effort, no increased work of breathing. GI: Abdomen is soft, nontender, nondistended, no abdominal masses Skin: No rashes, bruises or suspicious lesions. Neurologic: Grossly intact, no focal deficits, moving all 4 extremities. Psychiatric: Normal mood and affect.   Assessment & Plan:    1. Prostate cancer NCC and risk stratification: favorable intermediate Prostate MRI showed no findings suspicious for transcapital spread, seminal vascular involvement, pelvic adenopathy or pelvic bony metastatic disease.  Based on NCC guidelines, we  discussed recommendation of observation, which is the preferred management option versus EBRT/brachytherapy.  He has elected observation and will follow up in 6 months with a PSA.  [facility] [address], [address] [place], [place] [address] [phone]

## 2023-05-27 NOTE — Patient Instructions (Signed)

## 2023-05-27 NOTE — Progress Notes (Unsigned)
 Mar[place] Kitchen

## 2023-05-27 NOTE — Progress Notes (Unsigned)
 PROVIDER NOTE: Interpretation of information contained herein should be left to medically-trained personnel. Specific patient instructions are provided elsewhere under "Patient Instructions" section of medical record. This document was created in part using AI and STT-dictation technology, any transcriptional errors that may result from this process are unintentional.  Patient: Charles Ewing  Service: E/M   PCP: Raina Bunting, DO  DOB: Oct 11, 1940  DOS: 05/28/2023  Provider: Cherylin Corrigan, NP  MRN: [id number]  Delivery: Face-to-face  Specialty: Interventional Pain Management  Type: Established Patient  Setting: Ambulatory outpatient facility  Specialty designation: 09  Referring Prov.: Domingo Friend *  Location: Outpatient office facility       HPI  [name], a 83 y.o. year old male, is here today because of his No primary diagnosis found.. [name] primary complain today is No chief complaint on file.   Pain Assessment: Severity of   is reported as a  /10. Location:    / . Onset:  . Quality:  . Timing:  . Modifying factor(s):  Aaron Aas Vitals:  vitals were not taken for this visit.  BMI: Estimated body mass index is 26.13 kg/m as calculated from the following:   Height as of 05/12/23: 5' 11.5" (1.816 m).   Weight as of 05/12/23: 190 lb (86.2 kg). Last encounter: 02/19/2023.  Reason for encounter: medication management.  The patient is doing well with current medication regimen.  No adverse reaction or side effects reported to the medication.  Prescribed drug monitoring (PDMP) consistent with the prescribed medication. Pharmacotherapy Assessment  Analgesic: Oxycodone (Oxy IR/Roxicodone) 5 mg immediate release 2 times daily as needed for severe pain. MME=15  Monitoring: [place] PMP: PDMP reviewed during this encounter.       Pharmacotherapy: No side-effects or adverse reactions reported. Compliance: No problems identified. Effectiveness: Clinically acceptable.  No notes on  file  No results found for: "CBDTHCR" No results found for: "D8THCCBX" No results found for: "D9THCCBX"  UDS:  Summary  Date Value Ref Range Status  08/21/2022 Note  Final    Comment:    ==================================================================== [employer] [facility] (MW) ==================================================================== Test                             Result       Flag       Units  Drug Present and Declared for Prescription Verification   Lorazepam                      119          EXPECTED   ng/mg creat    Source of lorazepam is a scheduled prescription medication.    Oxycodone                      573          EXPECTED   ng/mg creat   Oxymorphone                    77           EXPECTED   ng/mg creat   Noroxycodone                   1127         EXPECTED   ng/mg creat    Sources of oxycodone include scheduled prescription medications.    Oxymorphone and noroxycodone are expected metabolites of oxycodone.  Oxymorphone is also available as a scheduled prescription medication.  ==================================================================== Test                      Result    Flag   Units      Ref Range   Creatinine              192              mg/dL      >=16 ==================================================================== Declared Medications:  The flagging and interpretation on this report are based on the  following declared medications.  Unexpected results may arise from  inaccuracies in the declared medications.   **Note: The testing scope of this panel includes these medications:   Lorazepam (Ativan)  Oxycodone   **Note: The testing scope of this panel does not include the  following reported medications:   Albuterol  Amlodipine  Aspirin  Fluoxetine (Prozac)  Fluticasone (Flonase)  Furosemide (Lasix)  Losartan  Meloxicam  Naloxone (Narcan)  Pantoprazole (Protonix)  Potassium Chloride  Simvastatin (Zocor)  Tamsulosin  (Flomax)  Tiotropium (Spiriva)  Vitamin B12 ==================================================================== For clinical consultation, please call [phone]. ====================================================================       ROS  Constitutional: Denies any fever or chills Gastrointestinal: No reported hemesis, hematochezia, vomiting, or acute GI distress Musculoskeletal: Denies any acute onset joint swelling, redness, loss of ROM, or weakness Neurological: No reported episodes of acute onset apraxia, aphasia, dysarthria, agnosia, amnesia, paralysis, loss of coordination, or loss of consciousness  Medication Review  FLUoxetine, albuterol, budeson-glycopyrrolate-formoterol, cyanocobalamin, fluticasone, furosemide, losartan, meloxicam, oxyCODONE, pantoprazole, simvastatin, and tamsulosin  History Review  Allergy: [name] is allergic to nexium [esomeprazole magnesium]. Drug: [name]  reports no history of drug use. Alcohol:  reports no history of alcohol use. Tobacco:  reports that he has been smoking cigarettes. He has a 70 pack-year smoking history. He has never used smokeless tobacco. Social: [name]  reports that he has been smoking cigarettes. He has a 70 pack-year smoking history. He has never used smokeless tobacco. He reports that he does not drink alcohol and does not use drugs. Medical:  has a past medical history of Abdominal aortic aneurysm (AAA) (HCC) (2013), Anxiety, Arrhythmia, Arthritis, Colon polyp, COPD (chronic obstructive pulmonary disease) (HCC), Coronary artery disease, Depression, GERD (gastroesophageal reflux disease), Hyperlipidemia, Hypertension, Skin cancer, and Thrush. Surgical: [name]  has a past surgical history that includes Skin cancer excision; Colon polyp surgery; Transurethral resection of prostate (2010); Cataract extraction w/PHACO (Left, 05/09/2020); and Cataract extraction w/PHACO (Right, 05/23/2020). Family: family history  includes Kidney cancer in his mother.  Laboratory Chemistry Profile   Renal Lab Results  Component Value Date   BUN 18 06/28/2022   CREATININE 1.18 06/28/2022   BCR SEE NOTE: 06/28/2022   GFRAA 64 01/11/2019   GFRNONAA >60 12/23/2020    Hepatic Lab Results  Component Value Date   AST 13 06/28/2022   ALT 8 (L) 06/28/2022   ALBUMIN 3.9 06/13/2021   ALKPHOS 70 06/13/2021    Electrolytes Lab Results  Component Value Date   NA 138 06/28/2022   K 4.9 06/28/2022   CL 104 06/28/2022   CALCIUM 8.8 06/28/2022   MG 2.3 06/13/2021    Bone Lab Results  Component Value Date   VD25OH 39 06/28/2022   25OHVITD1 14 (L) 06/13/2021   25OHVITD2 1.8 06/13/2021   25OHVITD3 12 06/13/2021    Inflammation (CRP: Acute Phase) (ESR: Chronic Phase) Lab Results  Component Value Date  CRP 1 06/13/2021   ESRSEDRATE 20 06/13/2021         Note: Above Lab results reviewed.  Recent Imaging Review  MR PROSTATE W WO CONTRAST CLINICAL DATA:  Elevated PSA level.  R97.20.  EXAM: MR PROSTATE WITHOUT AND WITH CONTRAST  TECHNIQUE: Multiplanar multisequence MRI images were obtained of the pelvis centered about the prostate. Pre and post contrast images were obtained.  CONTRAST:  9mL GADAVIST GADOBUTROL 1 MMOL/ML IV SOLN  COMPARISON:  None Available.  FINDINGS: Prostate: Encapsulated nodularity in the transition zone compatible with benign prostatic hypertrophy. Cystic degeneration of nodularity at the posterior base.  Hazy low T2 signal in the peripheral zone is nonfocal, likely postinflammatory, and is considered PI-RADS category 2. Low T1 signal favoring scarring at the left posterolateral apex as on image 26 of series 3 -2.  Region of interest # 1: Rads category 4 lesion of the right posterolateral posteromedial peripheral zone the apex with focally reduced T2 signal (image 59, series 9) corresponding to reduced ADC map activity and focally restricted diffusion (image 20 of series  7 and 8). This measures 0.42 cc (1.2 by 0.4 by 0.8 cm).  Volume: 3D volumetric analysis: Prostate volume 91.8 cc (5.8 by 5.7 by 6.6 cm).  Transcapsular spread:  Absent  Seminal vesicle involvement: Absent  Neurovascular bundle involvement: Absent  Pelvic adenopathy: Absent  Bone metastasis: Absent  Other findings: No other significant findings.  IMPRESSION: 1. PI-RADS category 4 lesion of the right posterolateral posteromedial peripheral zone at the apex. Targeting data sent to [facility]. 2. Scarring at the left posterolateral apex. 3. Benign prostatic hypertrophy and prostatomegaly.  Electronically Signed   By: Freida Jes M.D.   On: 11/04/2022 15:24 Note: Reviewed        Physical Exam  General appearance: Well nourished, well developed, and well hydrated. In no apparent acute distress Mental status: Alert, oriented x 3 (person, place, & time)       Respiratory: No evidence of acute respiratory distress Eyes: PERLA Vitals: There were no vitals taken for this visit. BMI: Estimated body mass index is 26.13 kg/m as calculated from the following:   Height as of 05/12/23: 5' 11.5" (1.816 m).   Weight as of 05/12/23: 190 lb (86.2 kg). Ideal: Patient weight not recorded  Assessment   Diagnosis Status  1. Chronic pain syndrome   2. Pharmacologic therapy   3. Osteoarthritis of hips (Bilateral)   4. Encounter for medication management   5. Chronic hip pain (2ry area of Pain) (Right)   6. Encounter for chronic pain management   7. Chronic lower extremity pain (3ry area of Pain) (Right)   8. Chronic use of opiate for therapeutic purpose   9. Lumbar facet syndrome (Right)   10. 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 Dextroscoliosis of lumbar spine    Controlled Controlled Controlled   Plan of Care  Assessment and Plan   Pharmacotherapy (Medications Ordered): No  orders of the defined types were placed in this encounter.  Orders:  No orders of the defined types were placed in this encounter.  Follow-up plan:   No follow-ups on file.    Recent Visits No visits were found meeting these conditions. Showing recent visits within past 90 days and meeting all other requirements Future Appointments Date Type Provider Dept  05/28/23 Appointment Renaldo Caroli, MD [facility]  Showing future appointments within  next 90 days and meeting all other requirements  I discussed the assessment and treatment plan with the patient. The patient was provided an opportunity to ask questions and all were answered. The patient agreed with the plan and demonstrated an understanding of the instructions.  Patient advised to call back or seek an in-person evaluation if the symptoms or condition worsens.  Duration of encounter: *** minutes.  Total time on encounter, as per AMA guidelines included both the face-to-face and non-face-to-face time personally spent by the physician and/or other qualified health care professional(s) on the day of the encounter (includes time in activities that require the physician or other qualified health care professional and does not include time in activities normally performed by clinical staff). Physician's time may include the following activities when performed: Preparing to see the patient (e.g., pre-charting review of records, searching for previously ordered imaging, lab work, and nerve conduction tests) Review of prior analgesic pharmacotherapies. Reviewing PMP Interpreting ordered tests (e.g., lab work, imaging, nerve conduction tests) Performing post-procedure evaluations, including interpretation of diagnostic procedures Obtaining and/or reviewing separately obtained history Performing a medically appropriate examination and/or evaluation Counseling and educating the patient/family/caregiver Ordering medications,  tests, or procedures Referring and communicating with other health care professionals (when not separately reported) Documenting clinical information in the electronic or other health record Independently interpreting results (not separately reported) and communicating results to the patient/ family/caregiver Care coordination (not separately reported)  Note by: Jaishon Krisher K Huck Ashworth, NP (TTS and AI technology used. I apologize for any typographical errors that were not detected and corrected.) Date: 05/28/2023; Time: 4:20 PM

## 2023-05-28 ENCOUNTER — Encounter: Payer: Self-pay | Admitting: Pain Medicine

## 2023-05-28 ENCOUNTER — Ambulatory Visit: Payer: 59 | Attending: Pain Medicine | Admitting: Pain Medicine

## 2023-05-28 VITALS — BP 107/64 | HR 65 | Temp 97.3°F | Resp 16 | Ht 71.5 in | Wt 240.0 lb

## 2023-05-28 DIAGNOSIS — M545 Low back pain, unspecified: Secondary | ICD-10-CM | POA: Diagnosis not present

## 2023-05-28 DIAGNOSIS — M4186 Other forms of scoliosis, lumbar region: Secondary | ICD-10-CM | POA: Diagnosis not present

## 2023-05-28 DIAGNOSIS — M160 Bilateral primary osteoarthritis of hip: Secondary | ICD-10-CM

## 2023-05-28 DIAGNOSIS — M79604 Pain in right leg: Secondary | ICD-10-CM | POA: Diagnosis not present

## 2023-05-28 DIAGNOSIS — M461 Sacroiliitis, not elsewhere classified: Secondary | ICD-10-CM

## 2023-05-28 DIAGNOSIS — M25551 Pain in right hip: Secondary | ICD-10-CM | POA: Diagnosis not present

## 2023-05-28 DIAGNOSIS — Z79899 Other long term (current) drug therapy: Secondary | ICD-10-CM

## 2023-05-28 DIAGNOSIS — Z79891 Long term (current) use of opiate analgesic: Secondary | ICD-10-CM | POA: Diagnosis not present

## 2023-05-28 DIAGNOSIS — G8929 Other chronic pain: Secondary | ICD-10-CM | POA: Diagnosis not present

## 2023-05-28 DIAGNOSIS — G894 Chronic pain syndrome: Secondary | ICD-10-CM

## 2023-05-28 DIAGNOSIS — M47816 Spondylosis without myelopathy or radiculopathy, lumbar region: Secondary | ICD-10-CM | POA: Diagnosis not present

## 2023-05-28 MED ORDER — OXYCODONE HCL 5 MG PO TABS: 5.0000 mg | ORAL_TABLET | Freq: Two times a day (BID) | ORAL | 0 refills | Status: DC | PRN

## 2023-05-28 MED ORDER — OXYCODONE HCL 5 MG PO TABS
5.0000 mg | ORAL_TABLET | Freq: Two times a day (BID) | ORAL | 0 refills | Status: DC | PRN
Start: 2023-07-01 — End: 2023-08-19

## 2023-05-28 NOTE — Progress Notes (Signed)
 Nursing Pain Medication Assessment:  Safety precautions to be maintained throughout the outpatient stay will include: orient to surroundings, keep bed in low position, maintain call bell within reach at all times, provide assistance with transfer out of bed and ambulation.  Medication Inspection Compliance: Pill count conducted under aseptic conditions, in front of the patient. Neither the pills nor the bottle was removed from the patient's sight at any time. Once count was completed pills were immediately returned to the patient in their original bottle.  Medication: Oxycodone IR Pill/Patch Count:  12 of 60 pills remain Pill/Patch Appearance: Markings consistent with prescribed medication Bottle Appearance: Standard pharmacy container. Clearly labeled. Filled Date: 3 / 21 / 2025 Last Medication intake:  Today

## 2023-05-28 NOTE — Progress Notes (Signed)
(  05/28/2023) Encounter and plan of care reviewed as Supervising Attending Physician. - Magnus Crescenzo, MD

## 2023-06-03 DIAGNOSIS — R208 Other disturbances of skin sensation: Secondary | ICD-10-CM | POA: Diagnosis not present

## 2023-06-03 DIAGNOSIS — X32XXXA Exposure to sunlight, initial encounter: Secondary | ICD-10-CM | POA: Diagnosis not present

## 2023-06-03 DIAGNOSIS — L821 Other seborrheic keratosis: Secondary | ICD-10-CM | POA: Diagnosis not present

## 2023-06-03 DIAGNOSIS — D225 Melanocytic nevi of trunk: Secondary | ICD-10-CM | POA: Diagnosis not present

## 2023-06-03 DIAGNOSIS — L570 Actinic keratosis: Secondary | ICD-10-CM | POA: Diagnosis not present

## 2023-06-03 DIAGNOSIS — R238 Other skin changes: Secondary | ICD-10-CM | POA: Diagnosis not present

## 2023-06-03 DIAGNOSIS — D2261 Melanocytic nevi of right upper limb, including shoulder: Secondary | ICD-10-CM | POA: Diagnosis not present

## 2023-06-03 DIAGNOSIS — Z85828 Personal history of other malignant neoplasm of skin: Secondary | ICD-10-CM | POA: Diagnosis not present

## 2023-06-03 DIAGNOSIS — D2262 Melanocytic nevi of left upper limb, including shoulder: Secondary | ICD-10-CM | POA: Diagnosis not present

## 2023-06-03 DIAGNOSIS — D485 Neoplasm of uncertain behavior of skin: Secondary | ICD-10-CM | POA: Diagnosis not present

## 2023-06-03 DIAGNOSIS — D0339 Melanoma in situ of other parts of face: Secondary | ICD-10-CM | POA: Diagnosis not present

## 2023-06-03 DIAGNOSIS — B078 Other viral warts: Secondary | ICD-10-CM | POA: Diagnosis not present

## 2023-06-09 ENCOUNTER — Other Ambulatory Visit: Payer: Self-pay | Admitting: *Deleted

## 2023-06-11 ENCOUNTER — Other Ambulatory Visit

## 2023-06-11 ENCOUNTER — Other Ambulatory Visit: Payer: Self-pay | Admitting: *Deleted

## 2023-06-11 DIAGNOSIS — C61 Malignant neoplasm of prostate: Secondary | ICD-10-CM

## 2023-06-11 DIAGNOSIS — R972 Elevated prostate specific antigen [PSA]: Secondary | ICD-10-CM

## 2023-06-12 LAB — PSA: Prostate Specific Ag, Serum: 15.4 ng/mL — ABNORMAL HIGH (ref 0.0–4.0)

## 2023-06-24 ENCOUNTER — Other Ambulatory Visit: Payer: Self-pay

## 2023-06-24 DIAGNOSIS — R7303 Prediabetes: Secondary | ICD-10-CM | POA: Diagnosis not present

## 2023-06-24 DIAGNOSIS — E538 Deficiency of other specified B group vitamins: Secondary | ICD-10-CM

## 2023-06-24 DIAGNOSIS — N183 Chronic kidney disease, stage 3 unspecified: Secondary | ICD-10-CM | POA: Diagnosis not present

## 2023-06-24 DIAGNOSIS — Z0000 Encounter for general adult medical examination without abnormal findings: Secondary | ICD-10-CM

## 2023-06-24 DIAGNOSIS — E785 Hyperlipidemia, unspecified: Secondary | ICD-10-CM

## 2023-06-24 DIAGNOSIS — I129 Hypertensive chronic kidney disease with stage 1 through stage 4 chronic kidney disease, or unspecified chronic kidney disease: Secondary | ICD-10-CM | POA: Diagnosis not present

## 2023-06-25 LAB — HEMOGLOBIN A1C
Hgb A1c MFr Bld: 6.1 % — ABNORMAL HIGH (ref <5.7)
Mean Plasma Glucose: 128 mg/dL
eAG (mmol/L): 7.1 mmol/L

## 2023-06-25 LAB — VITAMIN B12: Vitamin B-12: 1092 pg/mL (ref 200–1100)

## 2023-06-25 LAB — CBC WITH DIFFERENTIAL/PLATELET
Absolute Lymphocytes: 1998 {cells}/uL (ref 850–3900)
Absolute Monocytes: 496 {cells}/uL (ref 200–950)
Basophils Absolute: 30 {cells}/uL (ref 0–200)
Basophils Relative: 0.4 %
Eosinophils Absolute: 178 {cells}/uL (ref 15–500)
Eosinophils Relative: 2.4 %
HCT: 35.2 % — ABNORMAL LOW (ref 38.5–50.0)
Hemoglobin: 10.5 g/dL — ABNORMAL LOW (ref 13.2–17.1)
MCH: 24.2 pg — ABNORMAL LOW (ref 27.0–33.0)
MCHC: 29.8 g/dL — ABNORMAL LOW (ref 32.0–36.0)
MCV: 81.1 fL (ref 80.0–100.0)
MPV: 10.4 fL (ref 7.5–12.5)
Monocytes Relative: 6.7 %
Neutro Abs: 4699 {cells}/uL (ref 1500–7800)
Neutrophils Relative %: 63.5 %
Platelets: 264 10*3/uL (ref 140–400)
RBC: 4.34 10*6/uL (ref 4.20–5.80)
RDW: 16.2 % — ABNORMAL HIGH (ref 11.0–15.0)
Total Lymphocyte: 27 %
WBC: 7.4 10*3/uL (ref 3.8–10.8)

## 2023-06-25 LAB — COMPLETE METABOLIC PANEL WITHOUT GFR
AG Ratio: 1.9 (calc) (ref 1.0–2.5)
ALT: 9 U/L (ref 9–46)
AST: 12 U/L (ref 10–35)
Albumin: 3.7 g/dL (ref 3.6–5.1)
Alkaline phosphatase (APISO): 44 U/L (ref 35–144)
BUN: 20 mg/dL (ref 7–25)
CO2: 26 mmol/L (ref 20–32)
Calcium: 8.8 mg/dL (ref 8.6–10.3)
Chloride: 105 mmol/L (ref 98–110)
Creat: 1.2 mg/dL (ref 0.70–1.22)
Globulin: 2 g/dL (ref 1.9–3.7)
Glucose, Bld: 102 mg/dL — ABNORMAL HIGH (ref 65–99)
Potassium: 4.9 mmol/L (ref 3.5–5.3)
Sodium: 139 mmol/L (ref 135–146)
Total Bilirubin: 0.4 mg/dL (ref 0.2–1.2)
Total Protein: 5.7 g/dL — ABNORMAL LOW (ref 6.1–8.1)

## 2023-06-25 LAB — LIPID PANEL
Cholesterol: 90 mg/dL (ref <200)
HDL: 48 mg/dL (ref >=40)
LDL Cholesterol (Calc): 26 mg/dL
Non-HDL Cholesterol (Calc): 42 mg/dL (ref <130)
Total CHOL/HDL Ratio: 1.9 (calc) (ref <5.0)
Triglycerides: 75 mg/dL (ref <150)

## 2023-06-25 LAB — TSH: TSH: 3.15 m[IU]/L (ref 0.40–4.50)

## 2023-07-01 ENCOUNTER — Encounter: Payer: Self-pay | Admitting: Family Medicine

## 2023-07-01 ENCOUNTER — Ambulatory Visit (INDEPENDENT_AMBULATORY_CARE_PROVIDER_SITE_OTHER): Payer: Self-pay | Admitting: Family Medicine

## 2023-07-01 VITALS — BP 100/50 | HR 67 | Ht 71.5 in

## 2023-07-01 DIAGNOSIS — J432 Centrilobular emphysema: Secondary | ICD-10-CM

## 2023-07-01 DIAGNOSIS — C61 Malignant neoplasm of prostate: Secondary | ICD-10-CM

## 2023-07-01 DIAGNOSIS — N401 Enlarged prostate with lower urinary tract symptoms: Secondary | ICD-10-CM

## 2023-07-01 DIAGNOSIS — R350 Frequency of micturition: Secondary | ICD-10-CM | POA: Diagnosis not present

## 2023-07-01 DIAGNOSIS — G3184 Mild cognitive impairment, so stated: Secondary | ICD-10-CM

## 2023-07-01 DIAGNOSIS — R7303 Prediabetes: Secondary | ICD-10-CM

## 2023-07-01 DIAGNOSIS — R809 Proteinuria, unspecified: Secondary | ICD-10-CM

## 2023-07-01 DIAGNOSIS — Z7409 Other reduced mobility: Secondary | ICD-10-CM

## 2023-07-01 DIAGNOSIS — I129 Hypertensive chronic kidney disease with stage 1 through stage 4 chronic kidney disease, or unspecified chronic kidney disease: Secondary | ICD-10-CM

## 2023-07-01 DIAGNOSIS — N138 Other obstructive and reflux uropathy: Secondary | ICD-10-CM

## 2023-07-01 DIAGNOSIS — Z0000 Encounter for general adult medical examination without abnormal findings: Secondary | ICD-10-CM

## 2023-07-01 DIAGNOSIS — R3911 Hesitancy of micturition: Secondary | ICD-10-CM

## 2023-07-01 DIAGNOSIS — I10 Essential (primary) hypertension: Secondary | ICD-10-CM

## 2023-07-01 DIAGNOSIS — N183 Chronic kidney disease, stage 3 unspecified: Secondary | ICD-10-CM

## 2023-07-01 MED ORDER — LOSARTAN POTASSIUM 25 MG PO TABS
25.0000 mg | ORAL_TABLET | Freq: Every day | ORAL | 3 refills | Status: AC
Start: 2023-07-01 — End: ?

## 2023-07-01 MED ORDER — TAMSULOSIN HCL 0.4 MG PO CAPS
0.4000 mg | ORAL_CAPSULE | Freq: Every day | ORAL | 3 refills | Status: AC
Start: 2023-07-01 — End: ?

## 2023-07-01 NOTE — Patient Instructions (Addendum)
 Thank you for coming to the office today.  Refilled medications.  Mild anemia, Hemoglobin 10.3 Likely cancer related  Add Iron Supplement - Ferrous Sulfate 325 (65 Fe) once a day with meal + Vitamin C  ------------------  Recent Labs    06/24/23 0755  HGBA1C 6.1*   Blood sugar is stable, mild pre-diabetes  Referral to our team on the [facility] team with nursing and social worker who can help manage and assist anything in the home.  Please schedule a Follow-up Appointment to: Return in about 6 months (around 01/01/2024) for 6 month PreDM A1c.  If you have any other questions or concerns, please feel free to call the office or send a message through MyChart. You may also schedule an earlier appointment if necessary.  Additionally, you may be receiving a survey about your experience at our office within a few days to 1 week by e-mail or mail. We value your feedback.  Domingo Friend, DO [facility], [place]

## 2023-07-01 NOTE — Progress Notes (Signed)
 Subjective:    Patient ID: Charles Ewing, male    DOB: May 21, 1940, 83 y.o.   MRN: [id number]  Charles Ewing is a 83 y.o. male presenting on 07/01/2023 for Annual Exam   HPI  Discussed the use of AI scribe software for clinical note transcription with the patient, who gave verbal consent to proceed.  History of Present Illness   Charles MALTESE "Bud" is an 83 year old male who presents for an annual physical exam.  He experiences easy bruising on his arms and legs, which he attributes to thin skin. No dark stools or blood in the stool. He is not taking any iron supplements or multivitamins.  He takes a protein supplement and denies any significant changes in his diet or nutrition.  Mild Cognitive Impairment. Mobility difficulty He reports memory decline and reduced mobility, confirmed by his son.   He continues to take B12 supplements with no issues related to his thyroid  or vitamin B12 levels.      Chronic Low Back Pain, with bilateral sciatica Describes chronic low back pain, with radiating pain into both legs with sciatica, seems to be fairly persistent. He has followed with [facility], and has seen Dr Jodeane Mulligan, he has had an ESI injection 1.5 month without relief. - He admits difficulty with ambulation and has less functional, limited for ADL function. He has son that helps him now. Followed by Pain Management on medication therapy including opiate w Oxycodone . Previously w [place]   He is followed by [facility] Dr Barth Borne and also Dr Mont Antis [facility] Neurosurgery   HTN CKD CAD Lower Extremity Edema Followed by [facility] Cardiology 08/2022 last visit BP has been controlled History of heart disease CAD, on medication management LDL low and controlled < 55   Pre-Diabetes A1c 6.1, previous range 5.9-6.1+ range recently Meds: No longer on medication - Prior Metformin  XR Currently on ARB Denies hypoglycemia, polyuria, visual changes, numbness or  tingling.   Centrilobular Emphysema (COPD) Currently doing well. No new concern or recent flare up. He has maintenance therapy. Active smoker. Not ready to quit On Breztri     Chronic Anxiety Associated with PTSD Currently controlled on Lorazepam  0.5mg  nightly for insomnia and anxiety Currently on Fluoxetone 20mg  TWICE A DAY   History of Skin Cancer nose s/p excision and healed now.   SK inflamed Has new spot on back, he has some itching from it   Prostate Cancer Active surveillance by [facility]   HM Decline vaccines     02/19/2023   12:36 PM 12/31/2022    2:35 PM 05/27/2022   12:39 PM  Depression screen PHQ 2/9  Decreased Interest 0 3 1  Down, Depressed, Hopeless 0 3 0  PHQ - 2 Score 0 6 1  Altered sleeping  3   Tired, decreased energy  1   Change in appetite  1   Feeling bad or failure about yourself   2   Trouble concentrating  3   Moving slowly or fidgety/restless  0   Suicidal thoughts  0   PHQ-9 Score  16        12/31/2022    2:35 PM 02/15/2021    9:53 AM 08/26/2018    1:46 PM 04/09/2018   11:18 AM  GAD 7 : Generalized Anxiety Score  Nervous, Anxious, on Edge 3 2 2 2   Control/stop worrying 3 1 3 2   Worry too much - different things 3 1 3 3   Trouble relaxing  3 1 3 3   Restless 3 1 3 3   Easily annoyed or irritable 3 1 0 3  Afraid - awful might happen 3 1 3 3   Total GAD 7 Score 21 8 17 19   Anxiety Difficulty  Not difficult at all Not difficult at all Not difficult at all     Past Medical History:  Diagnosis Date   Abdominal aortic aneurysm (AAA) (HCC) 2013   found at [facility]-   Anxiety    Arrhythmia    Arthritis    Colon polyp    COPD (chronic obstructive pulmonary disease) (HCC)    Coronary artery disease    Depression    GERD (gastroesophageal reflux disease)    Hyperlipidemia    Hypertension    Skin cancer    Thrush    Past Surgical History:  Procedure Laterality Date   CATARACT EXTRACTION W/PHACO Left 05/09/2020   Procedure: CATARACT  EXTRACTION PHACO AND INTRAOCULAR LENS PLACEMENT (IOC) LEFT DIABETIC 9.09 01:02.0;  Surgeon: Clair Crews, MD;  Location[facility];  Service: Ophthalmology;  Laterality: Left;   CATARACT EXTRACTION W/PHACO Right 05/23/2020   Procedure: CATARACT EXTRACTION PHACO AND INTRAOCULAR LENS PLACEMENT (IOC) RIGHT DIABETIC 8.13 01:00.3;  Surgeon: Clair Crews, MD;  Location: [facility];  Service: Ophthalmology;  Laterality: Right;   Colon polyp surgery     SKIN CANCER EXCISION     TRANSURETHRAL RESECTION OF PROSTATE  2010   Social History   Socioeconomic History   Marital status: Divorced    Spouse name: Not on file   Number of children: Not on file   Years of education: Not on file   Highest education level: Not on file  Occupational History   Not on file  Tobacco Use   Smoking status: Every Day    Current packs/day: 1.00    Average packs/day: 1 pack/day for 70.0 years (70.0 ttl pk-yrs)    Types: Cigarettes   Smokeless tobacco: Never  Vaping Use   Vaping status: Never Used  Substance and Sexual Activity   Alcohol use: No   Drug use: No   Sexual activity: Not on file  Other Topics Concern   Not on file  Social History Narrative   Not on file   Social Drivers of Health   Financial Resource Strain: Low Risk  (12/31/2022)   Overall Financial Resource Strain (CARDIA)    Difficulty of Paying Living Expenses: Not hard at all  Food Insecurity: No Food Insecurity (12/31/2022)   Hunger Vital Sign    Worried About Running Out of Food in the Last Year: Never true    Ran Out of Food in the Last Year: Never true  Transportation Needs: No Transportation Needs (12/31/2022)   PRAPARE - Administrator, Civil Service (Medical): No    Lack of Transportation (Non-Medical): No  Physical Activity: Insufficiently Active (12/31/2022)   Exercise Vital Sign    Days of Exercise per Week: 3 days    Minutes of Exercise per Session: 10 min  Stress: Stress Concern  Present (12/31/2022)   [employer] of Occupational Health - Occupational Stress Questionnaire    Feeling of Stress : To some extent  Social Connections: Socially Isolated (12/31/2022)   Social Connection and Isolation Panel [NHANES]    Frequency of Communication with Friends and Family: Twice a week    Frequency of Social Gatherings with Friends and Family: Twice a week    Attends Religious Services: Never    Active Member of  Clubs or Organizations: No    Attends Banker Meetings: Never    Marital Status: Divorced  Catering manager Violence: Not At Risk (12/31/2022)   Humiliation, Afraid, Rape, and Kick questionnaire    Fear of Current or Ex-Partner: No    Emotionally Abused: No    Physically Abused: No    Sexually Abused: No   Family History  Problem Relation Age of Onset   Kidney cancer Mother    Prostate cancer Neg Hx    Bladder Cancer Neg Hx    Current Outpatient Medications on File Prior to Visit  Medication Sig   albuterol  (VENTOLIN  HFA) 108 (90 Base) MCG/ACT inhaler Inhale 2 puffs into the lungs every 6 (six) hours as needed for wheezing or shortness of breath.   BREZTRI  AEROSPHERE 160-9-4.8 MCG/ACT AERO Inhale 2 puffs into the lungs 2 (two) times daily.   cyanocobalamin  (VITAMIN B12) 1000 MCG tablet Take 1,000 mcg by mouth daily.   FLUoxetine  (PROZAC ) 20 MG capsule TAKE ONE CAPSULE BY MOUTH TWICE A DAY   fluticasone  (FLONASE ) 50 MCG/ACT nasal spray PLACE 2 SPRAYS INTO BOTH NOSTRILS DAILY   furosemide  (LASIX ) 20 MG tablet Take 1 tablet (20 mg total) by mouth daily as needed.   meloxicam  (MOBIC ) 15 MG tablet TAKE 1 TABLET BY MOUTH DAILY AS NEEDED FOR PAIN   oxyCODONE  (OXY IR/ROXICODONE ) 5 MG immediate release tablet Take 1 tablet (5 mg total) by mouth 2 (two) times daily as needed for severe pain (pain score 7-10). Each refill must last 30 days.   oxyCODONE  (OXY IR/ROXICODONE ) 5 MG immediate release tablet Take 1 tablet (5 mg total) by mouth 2 (two) times  daily as needed for severe pain (pain score 7-10). Each refill must last 30 days.   [START ON 07/31/2023] oxyCODONE  (OXY IR/ROXICODONE ) 5 MG immediate release tablet Take 1 tablet (5 mg total) by mouth 2 (two) times daily as needed for severe pain (pain score 7-10). Each refill must last 30 days.   pantoprazole  (PROTONIX ) 20 MG tablet Take 1 tablet by mouth daily.   simvastatin  (ZOCOR ) 20 MG tablet TAKE 1 TABLET BY MOUTH AT BEDTIME   No current facility-administered medications on file prior to visit.    Review of Systems Per HPI unless specifically indicated above     Objective:     BP (!) 100/50 (BP Location: Left Arm, Patient Position: Sitting, Cuff Size: Normal)   Pulse 67   Ht 5' 11.5" (1.816 m)   SpO2 96%   BMI 33.01 kg/m   Wt Readings from Last 3 Encounters:  05/28/23 240 lb (108.9 kg)  05/12/23 190 lb (86.2 kg)  05/01/23 207 lb (93.9 kg)    Physical Exam Vitals and nursing note reviewed.  Constitutional:      General: He is not in acute distress.    Appearance: He is well-developed. He is not diaphoretic.     Comments: Elderly 83 yr male, comfortable, cooperative  HENT:     Head: Normocephalic and atraumatic.  Eyes:     General:        Right eye: No discharge.        Left eye: No discharge.     Conjunctiva/sclera: Conjunctivae normal.     Pupils: Pupils are equal, round, and reactive to light.  Neck:     Thyroid : No thyromegaly.  Cardiovascular:     Rate and Rhythm: Normal rate and regular rhythm.     Pulses: Normal pulses.     Heart sounds:  Normal heart sounds. No murmur heard. Pulmonary:     Effort: Pulmonary effort is normal. No respiratory distress.     Breath sounds: Wheezing present. No rales.     Comments: Mild reduced breath sounds with scattered wheezes. Abdominal:     General: Bowel sounds are normal. There is no distension.     Palpations: Abdomen is soft. There is no mass.     Tenderness: There is no abdominal tenderness.  Musculoskeletal:         General: No tenderness. Normal range of motion.     Cervical back: Normal range of motion and neck supple.     Right lower leg: No edema.     Left lower leg: No edema.     Comments: Upper / Lower Extremities: - Normal muscle tone, strength bilateral upper extremities 5/5, lower extremities 5/5  Using cane for ambulation  Lymphadenopathy:     Cervical: No cervical adenopathy.  Skin:    General: Skin is warm and dry.     Findings: Bruising present. No erythema or rash.  Neurological:     Mental Status: He is alert and oriented to person, place, and time.     Comments: Distal sensation intact to light touch all extremities  Psychiatric:        Mood and Affect: Mood normal.        Behavior: Behavior normal.        Thought Content: Thought content normal.     Comments: Well groomed, good eye contact, normal speech and thoughts     Results for orders placed or performed in visit on 06/24/23  Vitamin B12   Collection Time: 06/24/23  7:55 AM  Result Value Ref Range   Vitamin B-12 1,092 200 - 1,100 pg/mL  TSH   Collection Time: 06/24/23  7:55 AM  Result Value Ref Range   TSH 3.15 0.40 - 4.50 mIU/L  CBC with Differential/Platelet   Collection Time: 06/24/23  7:55 AM  Result Value Ref Range   WBC 7.4 3.8 - 10.8 Thousand/uL   RBC 4.34 4.20 - 5.80 Million/uL   Hemoglobin 10.5 (L) 13.2 - 17.1 g/dL   HCT 81.1 (L) 91.4 - 78.2 %   MCV 81.1 80.0 - 100.0 fL   MCH 24.2 (L) 27.0 - 33.0 pg   MCHC 29.8 (L) 32.0 - 36.0 g/dL   RDW 95.6 (H) 21.3 - 08.6 %   Platelets 264 140 - 400 Thousand/uL   MPV 10.4 7.5 - 12.5 fL   Neutro Abs 4,699 1,500 - 7,800 cells/uL   Absolute Lymphocytes 1,998 850 - 3,900 cells/uL   Absolute Monocytes 496 200 - 950 cells/uL   Eosinophils Absolute 178 15 - 500 cells/uL   Basophils Absolute 30 0 - 200 cells/uL   Neutrophils Relative % 63.5 %   Total Lymphocyte 27.0 %   Monocytes Relative 6.7 %   Eosinophils Relative 2.4 %   Basophils Relative 0.4 %  COMPLETE  METABOLIC PANEL WITH GFR   Collection Time: 06/24/23  7:55 AM  Result Value Ref Range   Glucose, Bld 102 (H) 65 - 99 mg/dL   BUN 20 7 - 25 mg/dL   Creat 5.78 4.69 - 6.29 mg/dL   BUN/Creatinine Ratio SEE NOTE: 6 - 22 (calc)   Sodium 139 135 - 146 mmol/L   Potassium 4.9 3.5 - 5.3 mmol/L   Chloride 105 98 - 110 mmol/L   CO2 26 20 - 32 mmol/L   Calcium  8.8 8.6 - 10.3 mg/dL  Total Protein 5.7 (L) 6.1 - 8.1 g/dL   Albumin 3.7 3.6 - 5.1 g/dL   Globulin 2.0 1.9 - 3.7 g/dL (calc)   AG Ratio 1.9 1.0 - 2.5 (calc)   Total Bilirubin 0.4 0.2 - 1.2 mg/dL   Alkaline phosphatase (APISO) 44 35 - 144 U/L   AST 12 10 - 35 U/L   ALT 9 9 - 46 U/L  Lipid panel   Collection Time: 06/24/23  7:55 AM  Result Value Ref Range   Cholesterol 90 <200 mg/dL   HDL 48 > OR = 40 mg/dL   Triglycerides 75 <295 mg/dL   LDL Cholesterol (Calc) 26 mg/dL (calc)   Total CHOL/HDL Ratio 1.9 <5.0 (calc)   Non-HDL Cholesterol (Calc) 42 <621 mg/dL (calc)  Hemoglobin H0Q   Collection Time: 06/24/23  7:55 AM  Result Value Ref Range   Hgb A1c MFr Bld 6.1 (H) <5.7 %   Mean Plasma Glucose 128 mg/dL   eAG (mmol/L) 7.1 mmol/L      Assessment & Plan:   Problem List Items Addressed This Visit     Benign hypertension with CKD (chronic kidney disease) stage III (HCC)   Relevant Medications   losartan  (COZAAR ) 25 MG tablet   Other Relevant Orders   AMB Referral [facility] Care Management   Benign prostatic hyperplasia with urinary obstruction   Relevant Medications   tamsulosin  (FLOMAX ) 0.4 MG CAPS capsule   Other Relevant Orders   AMB Referral [facility] Care Management   Centrilobular emphysema (HCC)   Relevant Orders   AMB Referral [facility] Care Management   Mild cognitive impairment with memory loss   Relevant Orders   AMB Referral [facility] Care Management   Pre-diabetes   Other Visit Diagnoses       Annual physical exam    -  Primary     Urinary hesitancy       Relevant Medications   tamsulosin  (FLOMAX ) 0.4 MG CAPS capsule      Urinary frequency       Relevant Medications   tamsulosin  (FLOMAX ) 0.4 MG CAPS capsule     Microalbuminuria       Relevant Medications   losartan  (COZAAR ) 25 MG tablet     Essential hypertension       Relevant Medications   losartan  (COZAAR ) 25 MG tablet   Other Relevant Orders   AMB Referral [facility] Care Management     Prostate cancer (HCC)       Relevant Orders   AMB Referral [facility] Care Management     Mobility impaired       Relevant Orders   AMB Referral [facility] Care Management        Updated Health Maintenance information Reviewed recent lab results with patient Encouraged improvement to lifestyle with diet and exercise  Iron deficiency anemia, normocytic Mild iron deficiency anemia likely due to nutritional deficiency or blood loss. Prostate cancer may contribute to anemia. - Add ferrous sulfate 325 mg (65 mg elemental iron) once daily with a meal plus vitamin C.  Prostate cancer Managed by [facility]. Active surveillance. Regular PSA monitoring.  Dermatology has done biopsy Awaiting Referral to [facility] for skin cancer evaluation. MOHS management  Benign prostatic hyperplasia Managed with Flomax  for urinary symptoms. - Refill Flomax  prescription.  Peripheral edema Intermittent edema in left foot managed with diuretic. Normal circulation test results. - Continue diuretic furosemide  20mg  as needed. Monitor for dizziness and lightheadedness.  Mild Cognitive Impairment with Memory decline Memory decline with worsening  over time. Son Zyiere Rosemond, has moved in with him and assists with daily activities, medications, meals, mobility, transportation to appointments  - Refer to [facility] case management / nursing and social worker team for support and management.  Mobility decline Mobility decline impacting daily functioning. Son assists with daily activities. - Refer to nursing and social worker team for support and management.  Prediabetes Mild prediabetes with  HbA1c increased from 5.9 to 6.1, still within prediabetic range.  General health maintenance Declined pneumonia shot due to previous doses and pain concerns. Good cholesterol levels with simvastatin . Stable kidney and liver function. Improved protein levels. Normal sodium, potassium, and thyroid  function. Stable B12 levels with supplementation. - Consider pneumonia vaccination at pharmacy if interested. - Continue simvastatin  20 mg at bedtime. - Continue B12 supplementation. - Routine follow-up in six months.       Orders Placed This Encounter  Procedures   AMB Referral [facility] Care Management    Referral Priority:   Routine    Referral Type:   Consultation    Referral Reason:   Care Coordination    Number of Visits Requested:   1    Meds ordered this encounter  Medications   tamsulosin  (FLOMAX ) 0.4 MG CAPS capsule    Sig: Take 1 capsule (0.4 mg total) by mouth daily after breakfast.    Dispense:  90 capsule    Refill:  3   losartan  (COZAAR ) 25 MG tablet    Sig: Take 1 tablet (25 mg total) by mouth daily.    Dispense:  90 tablet    Refill:  3     Follow up plan: Return in about 6 months (around 01/01/2024) for 6 month PreDM A1c.  Domingo Friend, DO [facility] 07/01/2023, 3:09 PM

## 2023-07-04 ENCOUNTER — Telehealth: Payer: Self-pay

## 2023-07-04 NOTE — Progress Notes (Signed)
 Complex Care Management Note  Care Guide Note 07/04/2023 Name: JAIZON DEROOS MRN: [id number] DOB: Feb 06, 1941  BLANCA THORNTON is a 83 y.o. year old male who sees Raina Bunting, DO for primary care. I reached out to Alyson Jump by phone today to offer complex care management services.  Mr. Arvidson was given information about Complex Care Management services today including:   The Complex Care Management services include support from the care team which includes your Nurse Care Manager, Clinical Social Worker, or Pharmacist.  The Complex Care Management team is here to help remove barriers to the health concerns and goals most important to you. Complex Care Management services are voluntary, and the patient may decline or stop services at any time by request to their care team member.   Complex Care Management Consent Status: Patient agreed to services and verbal consent obtained.   Follow up plan:  Telephone appointment with complex care management team member scheduled for:  LCSW 07/23/2023 RNCM 07/28/2023  Encounter Outcome:  Patient Scheduled  Lenton Rail , RMA     [place]  [facility], [facility] Guide  Direct Dial: [phone]  Website: [email or web address]

## 2023-07-04 NOTE — Progress Notes (Signed)
 Care Guide Pharmacy Note  07/04/2023 Name: Charles Ewing MRN: [id number] DOB: 1941-01-21  Referred By: Raina Bunting, DO Reason for referral: Complex Care Management (Outreach to schedule referral )   Charles Ewing is a 83 y.o. year old male who is a primary care patient of Raina Bunting, DO.  Charles Ewing was referred to the pharmacist for assistance related to: HTN  Successful contact was made with the patient to discuss pharmacy services including being ready for the pharmacist to call at least 5 minutes before the scheduled appointment time and to have medication bottles and any blood pressure readings ready for review. The patient agreed to meet with the pharmacist via telephone visit on (date/time).07/21/2023  Lenton Rail , RMA     [place]  [facility], [facility] Guide  Direct Dial: [phone]  Website: [email or web address]

## 2023-07-21 ENCOUNTER — Other Ambulatory Visit: Payer: Self-pay | Admitting: Family Medicine

## 2023-07-21 ENCOUNTER — Other Ambulatory Visit (INDEPENDENT_AMBULATORY_CARE_PROVIDER_SITE_OTHER): Admitting: Pharmacist

## 2023-07-21 DIAGNOSIS — I129 Hypertensive chronic kidney disease with stage 1 through stage 4 chronic kidney disease, or unspecified chronic kidney disease: Secondary | ICD-10-CM

## 2023-07-21 DIAGNOSIS — J432 Centrilobular emphysema: Secondary | ICD-10-CM

## 2023-07-21 DIAGNOSIS — F5105 Insomnia due to other mental disorder: Secondary | ICD-10-CM

## 2023-07-21 NOTE — Patient Instructions (Signed)
 Goals Addressed             This Visit's Progress    Pharmacy Goals       Check your blood pressure twice weekly, and any time you have concerning symptoms like headache, chest pain, dizziness, shortness of breath, or vision changes.   Our goal is less than 130/80.  To appropriately check your blood pressure, make sure you do the following:  1) Avoid caffeine, exercise, or tobacco products for 30 minutes before checking. Empty your bladder. 2) Sit with your back supported in a flat-backed chair. Rest your arm on something flat (arm of the chair, table, etc). 3) Sit still with your feet flat on the floor, resting, for at least 5 minutes.  4) Check your blood pressure. Take 1-2 readings.  5) Write down these readings and bring with you to any provider appointments.  Bring your home blood pressure machine with you to a provider's office for accuracy comparison at least once a year.   Make sure you take your blood pressure medications before you come to any office visit, even if you were asked to fast for labs.   Our goal bad cholesterol, or LDL, is less than 70 . This is why it is important to continue taking your simvastatin.  Reminders for using your Breztri inhaler:  Please remember to prime your inhaler before using it for the first time. To do this, remove the cap from the mouthpiece, hold it upright, facing away from you, and shake well. Push the canister all the way down until it stops moving in the actuator to release a puff of medicine from the mouthpiece. You may hear a soft click from the dose indicator as it counts down. Repeat this process three times more.  To use the inhaler, take the cap off and shake the primed inhaler. Exhale as much as you reasonably can through your mouth, then close your lips around the mouthpiece and tilt your head back, keeping your tongue below the mouthpiece. Inhale deeply and slowly while pressing down on the canister until it stops moving. You  should feel a puff of medicine. Remove the inhaler while holding your breath for as long as you comfortably can, up to 10 seconds, before breathing out gently. Repeat this process once more and replace the cap. You should be taking 2 puffs, 2 times a day. After your second puff, rinse your mouth with water to remove any excess medicine, making sure you don't swallow the water.   Thank you!  Estelle Grumbles, PharmD, Patsy [place], CPP Clinical Pharmacist [facility] [phone]

## 2023-07-21 NOTE — Progress Notes (Signed)
 07/21/2023 Name: Charles Ewing MRN: [id number] DOB: May 03, 1940  Chief Complaint  Patient presents with   Medication Management    Charles Ewing is a 83 y.o. year old male who presented for a telephone visit.   They were referred to the pharmacist by their PCP for assistance in managing hypertension and medication access.      Subjective:   Care Team: Primary Care Provider: Raina Bunting, DO ; Next Scheduled Visit: 01/06/2024 Cardiologist: Ascencion Lava, PA; Next Scheduled Visit: 11/20/2023 Pain Management: Renaldo Caroli, MD; Next Scheduled Visit: 08/20/2023 Urologist: Geraline Knapp, MD; Next Scheduled Visit: 10/17/2023 Nurse Care Manager: Roxie Cord, RN; Next Scheduled Visit: 07/28/2023  Social Worker: [address], [place]; Next Scheduled Visit: 07/23/2023  Medication Access/Adherence  Current Pharmacy:  [facility] - [place], [place] - [address] [place] [address] Phone: [phone] Fax: [phone]   Patient reports affordability concerns with their medications: No  Patient reports access/transportation concerns to their pharmacy: No  Patient reports adherence concerns with their medications:  No     Reports using weekly pillbox to organize his medications. Reports misses a dose less than once/month - Reports fills his own pillbox, but that son picks up medications from pharmacy for him   Hypertension:   Current medications:  - losartan  25 mg daily - furosemide  20 mg daily as needed for swelling   Medications previously tried: amlodipine    Patient has an automated, upper arm home BP cuff Denies checking home BP recently; misplaced/thinking about getting a new one due to age of current monitor  Reports mostly eats high-sodium foods (frozen dinners and fast food)   Patient reports hypotensive s/sx including dizziness/lightheadedness improved with not taking furosemide  daily     COPD:    Current medications: - Breztri  160-9-4.8 mcg/act - 2 puffs twice daily  - albuterol  HFA 2 puffs every 6 hours as needed for wheezing or shortness of breath   Medications tried in the past: Spiriva  (plans to stop with start of Breztri ), Flovent    Reports has noticed improvement in his breathing since started Breztri . Reports using consistently twice daily and rinsing out his mouth after each use      Objective:   Lab Results  Component Value Date   CREATININE 1.20 06/24/2023   BUN 20 06/24/2023   NA 139 06/24/2023   K 4.9 06/24/2023   CL 105 06/24/2023   CO2 26 06/24/2023    Lab Results  Component Value Date   CHOL 90 06/24/2023   HDL 48 06/24/2023   LDLCALC 26 06/24/2023   TRIG 75 06/24/2023   CHOLHDL 1.9 06/24/2023   BP Readings from Last 3 Encounters:  07/01/23 (!) 100/50  05/28/23 107/64  05/12/23 (!) 76/43   Pulse Readings from Last 3 Encounters:  07/01/23 67  05/28/23 65  05/12/23 87    Medications Reviewed Today     Reviewed by Ardis Becton, RPH-CPP (Pharmacist) on 07/21/23 at 1533  Med List Status: <None>   Medication Order Taking? Sig Documenting Provider Last Dose Status Informant  albuterol  (VENTOLIN  HFA) 108 (90 Base) MCG/ACT inhaler [id number] Yes Inhale 2 puffs into the lungs every 6 (six) hours as needed for wheezing or shortness of breath. Raina Bunting, DO Taking Active   BREZTRI  AEROSPHERE 160-9-4.8 MCG/ACT Sudie Ely [id number] Yes Inhale 2 puffs into the lungs 2 (two) times daily. Raina Bunting, DO Taking Active   cyanocobalamin  (VITAMIN B12) 1000 MCG tablet  [id number] Yes Take 1,000 mcg by mouth daily. [provider] Taking Active   ferrous sulfate 325 (65 FE) MG tablet [id number] Yes Take 325 mg by mouth daily with breakfast. [provider]  Active   FLUoxetine  (PROZAC ) 20 MG capsule [id number] Yes TAKE ONE CAPSULE BY MOUTH TWICE A DAY Karamalegos, Kayleen Party, DO Taking Active   fluticasone  (FLONASE ) 50  MCG/ACT nasal spray [id number] Yes PLACE 2 SPRAYS INTO BOTH NOSTRILS DAILY Karamalegos, Kayleen Party, DO Taking Active   furosemide  (LASIX ) 20 MG tablet [id number] Yes Take 1 tablet (20 mg total) by mouth daily as needed. Raina Bunting, DO Taking Active   losartan  (COZAAR ) 25 MG tablet [id number] Yes Take 1 tablet (25 mg total) by mouth daily. Raina Bunting, DO Taking Active   meloxicam  (MOBIC ) 15 MG tablet [id number] Yes TAKE 1 TABLET BY MOUTH DAILY AS NEEDED FOR PAIN Raina Bunting, DO Taking Active   oxyCODONE  (OXY IR/ROXICODONE ) 5 MG immediate release tablet [id number]  Take 1 tablet (5 mg total) by mouth 2 (two) times daily as needed for severe pain (pain score 7-10). Each refill must last 30 days. Patel, Seema K, NP  Expired 07/01/23 2359   oxyCODONE  (OXY IR/ROXICODONE ) 5 MG immediate release tablet [id number]  Take 1 tablet (5 mg total) by mouth 2 (two) times daily as needed for severe pain (pain score 7-10). Each refill must last 30 days. Patel, Seema K, NP  Active   oxyCODONE  (OXY IR/ROXICODONE ) 5 MG immediate release tablet [id number]  Take 1 tablet (5 mg total) by mouth 2 (two) times daily as needed for severe pain (pain score 7-10). Each refill must last 30 days. Patel, Seema K, NP  Active   pantoprazole  (PROTONIX ) 20 MG tablet [id number] Yes Take 1 tablet by mouth daily. [provider] Taking Active   simvastatin  (ZOCOR ) 20 MG tablet [id number] Yes TAKE 1 TABLET BY MOUTH AT BEDTIME Raina Bunting, DO Taking Active   tamsulosin  (FLOMAX ) 0.4 MG CAPS capsule [id number] Yes Take 1 capsule (0.4 mg total) by mouth daily after breakfast. Raina Bunting, DO Taking Active   Med List Note Sibyl Drafts, RN 05/28/23 1348): MR 08/30/23 UDS 08-21-22              Assessment/Plan:   Comprehensive medication review performed; medication list updated in electronic medical record - Review caution for risk of dizziness and sedation with  oxycodone . Encourage patient to sit or lay down after taking. Patient verbalizes understanding  Identify patient has not yet picked up iron supplement as recommended by PCP at office visit on 07/01/2023. Advise patient to start Ferrous Sulfate 325 once a day with meal + Vitamin C as directed  States will follow up with pharmacy to pick this up  Place coordination of care call to [facility] [employer] on behalf of patient today    Encourage patient to continue using weekly pillbox   Hypertension: - Have reviewed appropriate blood pressure monitoring technique and reviewed goal blood pressure. - Remind patient to take positional changes slowly - Recommended to obtain a new upper arm blood pressure monitor through health plan OTC benefit and restart checking home blood pressure and heart rate, keep log of results and have this record to review at upcoming medical appointments. Patient to contact provider office sooner if needed for readings outside of established parameters or symptoms     COPD: - Reviewed appropriate inhaler technique for Breztri , including importance of rinsing out mouth after each use.  Follow Up Plan: Clinical Pharmacist will follow up with patient by telephone on 08/01/2023 at 11:00 AM    Arthur Lash, PharmD, Becky Bowels, CPP Clinical Pharmacist [facility] [phone]

## 2023-07-22 NOTE — Telephone Encounter (Signed)
 Requested medication (s) are due for refill today: review  Requested medication (s) are on the active medication list: no  Last refill:  02/10/23  Future visit scheduled: no  Notes to clinic:  Unable to refill per protocol, cannot delegate.     Requested Prescriptions  Pending Prescriptions Disp Refills   LORazepam  (ATIVAN ) 0.5 MG tablet [Pharmacy Med Name: LORAZEPAM  0.5 MG TAB] 30 tablet     Sig: TAKE 1 TABLET BY MOUTH AT BEDTIME     Not Delegated - Psychiatry: Anxiolytics/Hypnotics 2 Failed - 07/22/2023  1:01 PM      Failed - This refill cannot be delegated      Failed - Urine Drug Screen completed in last 360 days      Passed - Patient is not pregnant      Passed - Valid encounter within last 6 months    Recent Outpatient Visits           3 weeks ago Annual physical exam   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

## 2023-07-23 ENCOUNTER — Telehealth: Payer: Self-pay | Admitting: *Deleted

## 2023-07-28 ENCOUNTER — Other Ambulatory Visit: Payer: Self-pay

## 2023-08-01 ENCOUNTER — Other Ambulatory Visit (INDEPENDENT_AMBULATORY_CARE_PROVIDER_SITE_OTHER): Admitting: Pharmacist

## 2023-08-01 DIAGNOSIS — N183 Chronic kidney disease, stage 3 unspecified: Secondary | ICD-10-CM

## 2023-08-01 DIAGNOSIS — I129 Hypertensive chronic kidney disease with stage 1 through stage 4 chronic kidney disease, or unspecified chronic kidney disease: Secondary | ICD-10-CM

## 2023-08-01 NOTE — Progress Notes (Signed)
 08/01/2023 Name: Charles Ewing MRN: [id number] DOB: 1940/11/09  Chief Complaint  Patient presents with   Medication Management    Charles Ewing is a 83 y.o. year old male who presented for a telephone visit.   They were referred to the pharmacist by their PCP for assistance in managing hypertension and medication access.      Subjective:   Care Team: Primary Care Provider: Raina Bunting, DO ; Next Scheduled Visit: 01/06/2024 Cardiologist: Ascencion Lava, PA; Next Scheduled Visit: 11/20/2023 Pain Management: Renaldo Caroli, MD; Next Scheduled Visit: 08/20/2023 Urologist: Geraline Knapp, MD; Next Scheduled Visit: 10/17/2023 Nurse Care Manager: Roxie Cord, RN Social Worker: Fletcher Humble, [place]; Next Scheduled Visit: 08/08/2023  Medication Access/Adherence  Current Pharmacy:  [facility] - [place], [place] - [address] [place] [address] Phone: [phone] Fax: [phone]   Patient reports affordability concerns with their medications: No  Patient reports access/transportation concerns to their pharmacy: No  Patient reports adherence concerns with their medications:  No     Reports using weekly pillbox to organize his medications.  - Patient fills his own pillbox, but that son picks up medications from pharmacy for him    Confirms picked up Ferrous Sulfate 325 mg and Vitamin C from pharmacy and now taking as directed   Hypertension:   Current medications:  - losartan  25 mg daily - furosemide  20 mg daily as needed for swelling   Medications previously tried: amlodipine    Patient has an automated, upper arm home BP cuff Denies checking home BP recently; reports has not yet picked up new monitor due to limited transportation, but planning to pick up on Monday   Reports mostly eats high-sodium foods (frozen dinners and fast food)   Denies symptoms of hypotension, such as  dizziness/lightheadedness    Objective:   Lab Results  Component Value Date   CREATININE 1.20 06/24/2023   BUN 20 06/24/2023   NA 139 06/24/2023   K 4.9 06/24/2023   CL 105 06/24/2023   CO2 26 06/24/2023    Lab Results  Component Value Date   CHOL 90 06/24/2023   HDL 48 06/24/2023   LDLCALC 26 06/24/2023   TRIG 75 06/24/2023   CHOLHDL 1.9 06/24/2023   BP Readings from Last 3 Encounters:  07/01/23 (!) 100/50  05/28/23 107/64  05/12/23 (!) 76/43   Pulse Readings from Last 3 Encounters:  07/01/23 67  05/28/23 65  05/12/23 87    Current Outpatient Medications on File Prior to Visit  Medication Sig Dispense Refill   ferrous sulfate 325 (65 FE) MG tablet Take 325 mg by mouth daily with breakfast.     furosemide  (LASIX ) 20 MG tablet Take 1 tablet (20 mg total) by mouth daily as needed. 90 tablet 0   losartan  (COZAAR ) 25 MG tablet Take 1 tablet (25 mg total) by mouth daily. 90 tablet 3   albuterol  (VENTOLIN  HFA) 108 (90 Base) MCG/ACT inhaler Inhale 2 puffs into the lungs every 6 (six) hours as needed for wheezing or shortness of breath. 18 g 1 RF   BREZTRI  AEROSPHERE 160-9-4.8 MCG/ACT AERO Inhale 2 puffs into the lungs 2 (two) times daily. 10.7 g 11   cyanocobalamin  (VITAMIN B12) 1000 MCG tablet Take 1,000 mcg by mouth daily.     FLUoxetine  (PROZAC ) 20 MG capsule TAKE ONE CAPSULE BY MOUTH TWICE A DAY 180 capsule 3   fluticasone  (FLONASE ) 50 MCG/ACT nasal spray PLACE 2 SPRAYS INTO  BOTH NOSTRILS DAILY 16 g 3   LORazepam  (ATIVAN ) 0.5 MG tablet TAKE 1 TABLET BY MOUTH AT BEDTIME 30 tablet 2   meloxicam  (MOBIC ) 15 MG tablet TAKE 1 TABLET BY MOUTH DAILY AS NEEDED FOR PAIN 90 tablet 1   oxyCODONE  (OXY IR/ROXICODONE ) 5 MG immediate release tablet Take 1 tablet (5 mg total) by mouth 2 (two) times daily as needed for severe pain (pain score 7-10). Each refill must last 30 days. 60 tablet 0   oxyCODONE  (OXY IR/ROXICODONE ) 5 MG immediate release tablet Take 1 tablet (5 mg total) by mouth  2 (two) times daily as needed for severe pain (pain score 7-10). Each refill must last 30 days. 60 tablet 0   oxyCODONE  (OXY IR/ROXICODONE ) 5 MG immediate release tablet Take 1 tablet (5 mg total) by mouth 2 (two) times daily as needed for severe pain (pain score 7-10). Each refill must last 30 days. 60 tablet 0   pantoprazole  (PROTONIX ) 20 MG tablet Take 1 tablet by mouth daily.     simvastatin  (ZOCOR ) 20 MG tablet TAKE 1 TABLET BY MOUTH AT BEDTIME 90 tablet 1   tamsulosin  (FLOMAX ) 0.4 MG CAPS capsule Take 1 capsule (0.4 mg total) by mouth daily after breakfast. 90 capsule 3   No current facility-administered medications on file prior to visit.        Assessment/Plan:   Encourage patient to continue using weekly pillbox   Hypertension: - Have reviewed appropriate blood pressure monitoring technique and reviewed goal blood pressure. - Remind patient to take positional changes slowly - Recommended to obtain a new upper arm blood pressure monitor through health plan OTC benefit and restart checking home blood pressure and heart rate, keep log of results and have this record to review at upcoming medical appointments. Patient to contact provider office sooner if needed for readings outside of established parameters or symptoms  Plans to go to store with his brother on 6/23 to pick up new upper arm BP monitor         Follow Up Plan: Clinical Pharmacist will follow up with patient by telephone on 08/25/2023 at 1:00 PM    Arthur Lash, PharmD, Becky Bowels, CPP Clinical Pharmacist [facility] [phone]

## 2023-08-01 NOTE — Patient Instructions (Signed)
 Goals Addressed             This Visit's Progress    Pharmacy Goals       Check your blood pressure twice weekly, and any time you have concerning symptoms like headache, chest pain, dizziness, shortness of breath, or vision changes.   Our goal is less than 130/80.  To appropriately check your blood pressure, make sure you do the following:  1) Avoid caffeine, exercise, or tobacco products for 30 minutes before checking. Empty your bladder. 2) Sit with your back supported in a flat-backed chair. Rest your arm on something flat (arm of the chair, table, etc). 3) Sit still with your feet flat on the floor, resting, for at least 5 minutes.  4) Check your blood pressure. Take 1-2 readings.  5) Write down these readings and bring with you to any provider appointments.  Bring your home blood pressure machine with you to a provider's office for accuracy comparison at least once a year.   Make sure you take your blood pressure medications before you come to any office visit, even if you were asked to fast for labs.   Our goal bad cholesterol, or LDL, is less than 70 . This is why it is important to continue taking your simvastatin.  Reminders for using your Breztri inhaler:  Please remember to prime your inhaler before using it for the first time. To do this, remove the cap from the mouthpiece, hold it upright, facing away from you, and shake well. Push the canister all the way down until it stops moving in the actuator to release a puff of medicine from the mouthpiece. You may hear a soft click from the dose indicator as it counts down. Repeat this process three times more.  To use the inhaler, take the cap off and shake the primed inhaler. Exhale as much as you reasonably can through your mouth, then close your lips around the mouthpiece and tilt your head back, keeping your tongue below the mouthpiece. Inhale deeply and slowly while pressing down on the canister until it stops moving. You  should feel a puff of medicine. Remove the inhaler while holding your breath for as long as you comfortably can, up to 10 seconds, before breathing out gently. Repeat this process once more and replace the cap. You should be taking 2 puffs, 2 times a day. After your second puff, rinse your mouth with water to remove any excess medicine, making sure you don't swallow the water.   Thank you!  Estelle Grumbles, PharmD, Patsy [place], CPP Clinical Pharmacist [facility] [phone]

## 2023-08-08 ENCOUNTER — Other Ambulatory Visit: Payer: Self-pay | Admitting: Licensed Clinical Social Worker

## 2023-08-08 NOTE — Patient Outreach (Signed)
 Called pt phone and son ph unable to leave message due to mailbox not set up

## 2023-08-20 ENCOUNTER — Encounter: Payer: Self-pay | Admitting: Nurse Practitioner

## 2023-08-20 ENCOUNTER — Ambulatory Visit: Attending: Nurse Practitioner | Admitting: Nurse Practitioner

## 2023-08-20 DIAGNOSIS — G894 Chronic pain syndrome: Secondary | ICD-10-CM | POA: Diagnosis not present

## 2023-08-20 DIAGNOSIS — Z79899 Other long term (current) drug therapy: Secondary | ICD-10-CM | POA: Diagnosis not present

## 2023-08-20 DIAGNOSIS — M160 Bilateral primary osteoarthritis of hip: Secondary | ICD-10-CM | POA: Insufficient documentation

## 2023-08-20 DIAGNOSIS — M47816 Spondylosis without myelopathy or radiculopathy, lumbar region: Secondary | ICD-10-CM | POA: Diagnosis not present

## 2023-08-20 DIAGNOSIS — M545 Low back pain, unspecified: Secondary | ICD-10-CM | POA: Insufficient documentation

## 2023-08-20 DIAGNOSIS — M25551 Pain in right hip: Secondary | ICD-10-CM | POA: Insufficient documentation

## 2023-08-20 DIAGNOSIS — M79604 Pain in right leg: Secondary | ICD-10-CM | POA: Diagnosis not present

## 2023-08-20 DIAGNOSIS — M461 Sacroiliitis, not elsewhere classified: Secondary | ICD-10-CM | POA: Diagnosis not present

## 2023-08-20 DIAGNOSIS — M4186 Other forms of scoliosis, lumbar region: Secondary | ICD-10-CM | POA: Insufficient documentation

## 2023-08-20 DIAGNOSIS — G8929 Other chronic pain: Secondary | ICD-10-CM | POA: Diagnosis not present

## 2023-08-20 DIAGNOSIS — Z79891 Long term (current) use of opiate analgesic: Secondary | ICD-10-CM | POA: Insufficient documentation

## 2023-08-20 MED ORDER — OXYCODONE HCL 5 MG PO TABS: 5.0000 mg | ORAL_TABLET | Freq: Two times a day (BID) | ORAL | 0 refills | Status: DC | PRN

## 2023-08-20 NOTE — Progress Notes (Signed)
 PROVIDER NOTE: Interpretation of information contained herein should be left to medically-trained personnel. Specific patient instructions are provided elsewhere under Patient Instructions section of medical record. This document was created in part using AI and STT-dictation technology, any transcriptional errors that may result from this process are unintentional.  Patient: Charles Ewing  Service: E/M   PCP: Edman Marsa PARAS, DO  DOB: 04-Apr-1940  DOS: 08/20/2023  Provider: Emmy MARLA Blanch, NP  MRN: [id number]  Delivery: Face-to-face  Specialty: Interventional Pain Management  Type: Established Patient  Setting: Ambulatory outpatient facility  Specialty designation: 09  Referring Prov.: Edman Marsa *  Location: Outpatient office facility       Ewing of present illness (HPI) Charles Ewing, a 83 y.o. year old male, is here today because of his No primary diagnosis found.. Charles Ewing primary complain today is Back Pain (Radiates down right leg to about to knee, Left hip )  Pertinent problems: Mr. Stephanie has Osteoarthritis of knee (Bilateral); Bilateral lower extremity edema; Chronic pain syndrome; DDD (degenerative disc disease), lumbosacral; and Osteopenia of the lumbar spine on their pertinent problem list.  Pain Assessment: Severity of Chronic pain is reported as a 10-Worst pain ever/10. Location: Back Right, Left, Lower/Down right leg to the knee, left hip. Onset: More than a month ago. Quality: Sharp. Timing: Constant. Modifying factor(s): Medication,rest. Vitals:  height is 5' 11 (1.803 m) and weight is 240 lb (108.9 kg). His temperature is 97.2 F (36.2 C) (abnormal). His blood pressure is 135/74 and his pulse is 91. His respiration is 18 and oxygen saturation is 98%.  BMI: Estimated body mass index is 33.47 kg/m as calculated from the following:   Height as of this encounter: 5' 11 (1.803 m).   Weight as of this encounter: 240 lb (108.9 kg).  Last encounter:  05/28/2023 Last procedure: Visit date not found.  Reason for encounter: medication management.  The patient is doing well with current medication regimen.  No adverse reaction or side effects reported to the medication.   The patient complains of chronic low back pain, pain radiating down to the right leg to approximately the knee, along with the left hip pain.  However the current medication regimen is providing adequate pain relief and maintaining functional mobility.  Pharmacotherapy Assessment    Oxycodone  (Oxy IR/Roxicodone ) 5 mg immediate release 2 times daily as needed for severe pain. MME=15  Monitoring: [place] PMP: PDMP reviewed during this encounter.       Pharmacotherapy: No side-effects or adverse reactions reported. Compliance: No problems identified. Effectiveness: Clinically acceptable.  [name], [place]  08/20/2023 10:19 AM  Sign when Signing Visit Nursing Pain Medication Assessment:  Safety precautions to be maintained throughout the outpatient stay will include: orient to surroundings, keep bed in low position, maintain call bell within reach at all times, provide assistance with transfer out of bed and ambulation.  Medication Inspection Compliance: Pill count conducted under aseptic conditions, in front of the patient. Neither the pills nor the bottle was removed from the patient's sight at any time. Once count was completed pills were immediately returned to the patient in their original bottle.  Medication: Oxycodone  IR Pill/Patch Count: 17 of 60 pills/patches remain Pill/Patch Appearance: Markings consistent with prescribed medication Bottle Appearance: Standard pharmacy container. Clearly labeled. Filled Date: 06 / 19 / 2025 Last Medication intake:  Today  UDS:  Summary  Date Value Ref Range Status  08/21/2022 Note  Final    Comment:    ====================================================================  ToxASSURE Select 13  (MW) ==================================================================== Test                             Result       Flag       Units  Drug Present and Declared for Prescription Verification   Lorazepam                       119          EXPECTED   ng/mg creat    Source of lorazepam  is a scheduled prescription medication.    Oxycodone                       573          EXPECTED   ng/mg creat   Oxymorphone                    77           EXPECTED   ng/mg creat   Noroxycodone                   1127         EXPECTED   ng/mg creat    Sources of oxycodone  include scheduled prescription medications.    Oxymorphone and noroxycodone are expected metabolites of oxycodone .    Oxymorphone is also available as a scheduled prescription medication.  ==================================================================== Test                      Result    Flag   Units      Ref Range   Creatinine              192              mg/dL      >=79 ==================================================================== Declared Medications:  The flagging and interpretation on this report are based on the  following declared medications.  Unexpected results may arise from  inaccuracies in the declared medications.   **Note: The testing scope of this panel includes these medications:   Lorazepam  (Ativan )  Oxycodone    **Note: The testing scope of this panel does not include the  following reported medications:   Albuterol   Amlodipine   Aspirin   Fluoxetine  (Prozac )  Fluticasone  (Flonase )  Furosemide  (Lasix )  Losartan   Meloxicam   Naloxone  (Narcan )  Pantoprazole  (Protonix )  Potassium Chloride   Simvastatin  (Zocor )  Tamsulosin  (Flomax )  Tiotropium (Spiriva )  Vitamin B12 ==================================================================== For clinical consultation, please call [phone]. ====================================================================     No results found for: CBDTHCR No  results found for: D8THCCBX No results found for: D9THCCBX  ROS  Constitutional: Denies any fever or chills Gastrointestinal: No reported hemesis, hematochezia, vomiting, or acute GI distress Musculoskeletal: Low back pain radiating down to the knee, left hip pain Neurological: No reported episodes of acute onset apraxia, aphasia, dysarthria, agnosia, amnesia, paralysis, loss of coordination, or loss of consciousness  Medication Review  FLUoxetine , LORazepam , albuterol , budesonide-glycopyrrolate-formoterol, cyanocobalamin , ferrous sulfate, fluticasone , furosemide , losartan , meloxicam , oxyCODONE , pantoprazole , simvastatin , and tamsulosin   Ewing Review  Allergy: Charles Ewing is allergic to nexium [esomeprazole magnesium]. Drug: Charles Ewing  reports no Ewing of drug use. Alcohol:  reports no Ewing of alcohol use. Tobacco:  reports that he has been smoking cigarettes. He has a 70 pack-year smoking Ewing. He has never used smokeless tobacco. Social: Charles Ewing  reports that he has been smoking  cigarettes. He has a 70 pack-year smoking Ewing. He has never used smokeless tobacco. He reports that he does not drink alcohol and does not use drugs. Medical:  has a past medical Ewing of Abdominal aortic aneurysm (AAA) (HCC) (2013), Anxiety, Arrhythmia, Arthritis, Colon polyp, COPD (chronic obstructive pulmonary disease) (HCC), Coronary artery disease, Depression, GERD (gastroesophageal reflux disease), Hyperlipidemia, Hypertension, Skin cancer, and Thrush. Surgical: Charles Ewing  has a past surgical Ewing that includes Skin cancer excision; Colon polyp surgery; Transurethral resection of prostate (2010); Cataract extraction w/PHACO (Left, 05/09/2020); and Cataract extraction w/PHACO (Right, 05/23/2020). Family: family Ewing includes Kidney cancer in his mother.  Laboratory Chemistry Profile   Renal Lab Results  Component Value Date   BUN 20 06/24/2023   CREATININE 1.20 06/24/2023   BCR SEE  NOTE: 06/24/2023   GFRAA 64 01/11/2019   GFRNONAA >60 12/23/2020    Hepatic Lab Results  Component Value Date   AST 12 06/24/2023   ALT 9 06/24/2023   ALBUMIN 3.9 06/13/2021   ALKPHOS 70 06/13/2021    Electrolytes Lab Results  Component Value Date   NA 139 06/24/2023   K 4.9 06/24/2023   CL 105 06/24/2023   CALCIUM  8.8 06/24/2023   MG 2.3 06/13/2021    Bone Lab Results  Component Value Date   VD25OH 39 06/28/2022   25OHVITD1 14 (L) 06/13/2021   25OHVITD2 1.8 06/13/2021   25OHVITD3 12 06/13/2021    Inflammation (CRP: Acute Phase) (ESR: Chronic Phase) Lab Results  Component Value Date   CRP 1 06/13/2021   ESRSEDRATE 20 06/13/2021         Note: Above Lab results reviewed.  Recent Imaging Review  MR PROSTATE W WO CONTRAST CLINICAL DATA:  Elevated PSA level.  R97.20.  EXAM: MR PROSTATE WITHOUT AND WITH CONTRAST  TECHNIQUE: Multiplanar multisequence MRI images were obtained of the pelvis centered about the prostate. Pre and post contrast images were obtained.  CONTRAST:  9mL GADAVIST  GADOBUTROL  1 MMOL/ML IV SOLN  COMPARISON:  None Available.  FINDINGS: Prostate: Encapsulated nodularity in the transition zone compatible with benign prostatic hypertrophy. Cystic degeneration of nodularity at the posterior base.  Hazy low T2 signal in the peripheral zone is nonfocal, likely postinflammatory, and is considered PI-RADS category 2. Low T1 signal favoring scarring at the left posterolateral apex as on image 26 of series 3 -2.  Region of interest # 1: Rads category 4 lesion of the right posterolateral posteromedial peripheral zone the apex with focally reduced T2 signal (image 59, series 9) corresponding to reduced ADC map activity and focally restricted diffusion (image 20 of series 7 and 8). This measures 0.42 cc (1.2 by 0.4 by 0.8 cm).  Volume: 3D volumetric analysis: Prostate volume 91.8 cc (5.8 by 5.7 by 6.6 cm).  Transcapsular spread:  Absent  Seminal  vesicle involvement: Absent  Neurovascular bundle involvement: Absent  Pelvic adenopathy: Absent  Bone metastasis: Absent  Other findings: No other significant findings.  IMPRESSION: 1. PI-RADS category 4 lesion of the right posterolateral posteromedial peripheral zone at the apex. Targeting data sent to [facility]. 2. Scarring at the left posterolateral apex. 3. Benign prostatic hypertrophy and prostatomegaly.  Electronically Signed   By: Ryan Salvage M.D.   On: 11/04/2022 15:24 Note: Reviewed        Physical Exam  Vitals: BP 135/74   Pulse 91   Temp (!) 97.2 F (36.2 C)   Resp 18   Ht 5' 11 (1.803 m)   Wt 240 lb (108.9  kg)   SpO2 98%   BMI 33.47 kg/m  BMI: Estimated body mass index is 33.47 kg/m as calculated from the following:   Height as of this encounter: 5' 11 (1.803 m).   Weight as of this encounter: 240 lb (108.9 kg). Ideal: Ideal body weight: 75.3 kg (166 lb 0.1 oz) Adjusted ideal body weight: 88.7 kg (195 lb 9.7 oz) General appearance: Well nourished, well developed, and well hydrated. In no apparent acute distress Mental status: Alert, oriented x 3 (person, place, & time)       Respiratory: No evidence of acute respiratory distress Eyes: PERLA   Assessment   Diagnosis Status  1. Chronic low back pain (1ry area of Pain) (Bilateral) (R>L) w/o sciatica   2. Chronic hip pain (2ry area of Pain) (Right)   3. Chronic lower extremity pain (3ry area of Pain) (Right)   4. Osteoarthritis of hips (Bilateral)   5. Lumbar facet syndrome (Right)   6. Osteoarthritis of sacroiliac joints (HCC) (Bilateral)   7. Dextroscoliosis of lumbar spine   8. Chronic pain syndrome   9. Pharmacologic therapy   10. Chronic use of opiate for therapeutic purpose   11. Encounter for medication management   12. Encounter for chronic pain management    Controlled Controlled Controlled   Updated Problems: No problems updated.  Plan of Care  Problem-specific:  Assessment  and Plan We will continue on current medication regimen.  And drug monitoring (PDMP) reviewed; findings consistent with the use of prescribed medication and no evidence of narcotic misuse or abuse Routine UDS ordered today.  No other new issues or problems reported to this visit.  Schedule follow-up in 90 days for medication management.    Charles Ewing has a current medication list which includes the following long-term medication(s): albuterol , ferrous sulfate, fluoxetine , fluticasone , furosemide , losartan , simvastatin , [START ON 08/30/2023] oxycodone , [START ON 09/29/2023] oxycodone , and [START ON 10/29/2023] oxycodone .  Pharmacotherapy (Medications Ordered): Meds ordered this encounter  Medications   oxyCODONE  (OXY IR/ROXICODONE ) 5 MG immediate release tablet    Sig: Take 1 tablet (5 mg total) by mouth 2 (two) times daily as needed for severe pain (pain score 7-10). Each refill must last 30 days.    Dispense:  60 tablet    Refill:  0    DO NOT: delete (not duplicate); no partial-fill (will deny script to complete), no refill request (F/U required). DISPENSE: 1 day early if closed on fill date. WARN: No CNS-depressants within 8 hrs of med.   oxyCODONE  (OXY IR/ROXICODONE ) 5 MG immediate release tablet    Sig: Take 1 tablet (5 mg total) by mouth 2 (two) times daily as needed for severe pain (pain score 7-10). Each refill must last 30 days.    Dispense:  60 tablet    Refill:  0    DO NOT: delete (not duplicate); no partial-fill (will deny script to complete), no refill request (F/U required). DISPENSE: 1 day early if closed on fill date. WARN: No CNS-depressants within 8 hrs of med.   oxyCODONE  (OXY IR/ROXICODONE ) 5 MG immediate release tablet    Sig: Take 1 tablet (5 mg total) by mouth 2 (two) times daily as needed for severe pain (pain score 7-10). Each refill must last 30 days.    Dispense:  60 tablet    Refill:  0    DO NOT: delete (not duplicate); no partial-fill (will deny script to  complete), no refill request (F/U required). DISPENSE: 1 day early if  closed on fill date. WARN: No CNS-depressants within 8 hrs of med.   Orders:  Orders Placed This Encounter  Procedures   ToxASSURE Select 13 (MW), Urine    Volume: 30 ml(s). Minimum 3 ml of urine is needed. Document temperature of fresh sample. Indications: Long term (current) use of opiate analgesic (S20.108)    Release to patient:   Immediate        Return in about 3 months (around 11/20/2023) for (F2F), (MM), Emmy Blanch NP.    Recent Visits Date Type Provider Dept  05/28/23 Office Visit Tanya Glisson, MD [facility]  Showing recent visits within past 90 days and meeting all other requirements Today's Visits Date Type Provider Dept  08/20/23 Office Visit Eustolia Drennen K, NP [facility]  Showing today's visits and meeting all other requirements Future Appointments Date Type Provider Dept  11/18/23 Appointment Corban Kistler K, NP [facility]  Showing future appointments within next 90 days and meeting all other requirements  I discussed the assessment and treatment plan with the patient. The patient was provided an opportunity to ask questions and all were answered. The patient agreed with the plan and demonstrated an understanding of the instructions.  Patient advised to call back or seek an in-person evaluation if the symptoms or condition worsens.  Duration of encounter: 30 minutes.  Total time on encounter, as per AMA guidelines included both the face-to-face and non-face-to-face time personally spent by the physician and/or other qualified health care professional(s) on the day of the encounter (includes time in activities that require the physician or other qualified health care professional and does not include time in activities normally performed by clinical staff). Physician's time may include the following activities when performed: Preparing to see the patient (e.g.,  pre-charting review of records, searching for previously ordered imaging, lab work, and nerve conduction tests) Review of prior analgesic pharmacotherapies. Reviewing PMP Interpreting ordered tests (e.g., lab work, imaging, nerve conduction tests) Performing post-procedure evaluations, including interpretation of diagnostic procedures Obtaining and/or reviewing separately obtained Ewing Performing a medically appropriate examination and/or evaluation Counseling and educating the patient/family/caregiver Ordering medications, tests, or procedures Referring and communicating with other health care professionals (when not separately reported) Documenting clinical information in the electronic or other health record Independently interpreting results (not separately reported) and communicating results to the patient/ family/caregiver Care coordination (not separately reported)  Note by: Taitum Menton K Rianne Degraaf, NP (TTS and AI technology used. I apologize for any typographical errors that were not detected and corrected.) Date: 08/20/2023; Time: 12:19 PM

## 2023-08-20 NOTE — Progress Notes (Signed)
 Nursing Pain Medication Assessment:  Safety precautions to be maintained throughout the outpatient stay will include: orient to surroundings, keep bed in low position, maintain call bell within reach at all times, provide assistance with transfer out of bed and ambulation.  Medication Inspection Compliance: Pill count conducted under aseptic conditions, in front of the patient. Neither the pills nor the bottle was removed from the patient's sight at any time. Once count was completed pills were immediately returned to the patient in their original bottle.  Medication: Oxycodone  IR Pill/Patch Count: 17 of 60 pills/patches remain Pill/Patch Appearance: Markings consistent with prescribed medication Bottle Appearance: Standard pharmacy container. Clearly labeled. Filled Date: 06 / 19 / 2025 Last Medication intake:  Today

## 2023-08-21 ENCOUNTER — Other Ambulatory Visit: Payer: Self-pay | Admitting: Family Medicine

## 2023-08-21 ENCOUNTER — Telehealth: Payer: Self-pay

## 2023-08-21 NOTE — Progress Notes (Signed)
 Complex Care Management Care Guide Note  08/21/2023 Name: Charles Ewing MRN: [id number] DOB: 1940-12-27  Charles Ewing is a 83 y.o. year old male who is a primary care patient of Edman Marsa PARAS, DO and is actively engaged with the care management team. I reached out to Clem GORMAN Moats by phone today to assist with re-scheduling  with the RN Case Manager Licensed Clinical Social Worker.  Follow up plan: Unsuccessful telephone outreach attempt made. A HIPAA compliant phone message was left for the patient providing contact information and requesting a return call.  Jeoffrey Buffalo , RMA     [facility]  [facility] Guide  Direct Dial: [phone]  Website: [email or web address]

## 2023-08-22 NOTE — Telephone Encounter (Signed)
 Requested medications are due for refill today.  unsure  Requested medications are on the active medications list.  no  Last refill. 02/14/2023  Future visit scheduled.   With pharmacy  Notes to clinic.  Please review for refill.    Requested Prescriptions  Pending Prescriptions Disp Refills   potassium chloride  (MICRO-K ) 10 MEQ CR capsule [Pharmacy Med Name: POTASSIUM CHLORIDE  ER 10 MEQ CAP] 45 capsule     Sig: TAKE 1 CAPSULE BY MOUTH EVERY OTHER DAY     Endocrinology:  Minerals - Potassium Supplementation Passed - 08/22/2023  3:40 PM      Passed - K in normal range and within 360 days    Potassium  Date Value Ref Range Status  06/24/2023 4.9 3.5 - 5.3 mmol/L Final  10/16/2013 3.8 3.5 - 5.1 mmol/L Final         Passed - Cr in normal range and within 360 days    Creat  Date Value Ref Range Status  06/24/2023 1.20 0.70 - 1.22 mg/dL Final         Passed - Valid encounter within last 12 months    Recent Outpatient Visits           1 month ago Annual physical exam   [place] [facility] Edman Marsa PARAS, DO   4 months ago Centrilobular emphysema [facility])   [place] [facility] Edman Marsa PARAS, DO       Future Appointments             In 1 month Stoioff, Glendia BROCKS, MD [facility]

## 2023-08-24 LAB — TOXASSURE SELECT 13 (MW), URINE

## 2023-08-25 ENCOUNTER — Other Ambulatory Visit

## 2023-08-25 ENCOUNTER — Telehealth: Payer: Self-pay | Admitting: Pharmacist

## 2023-08-25 NOTE — Progress Notes (Signed)
   Outreach Note  08/25/2023 Name: JHOSTIN EPPS MRN: [id number] DOB: 1940/05/26  Referred by: Edman Marsa PARAS, DO  Was unable to reach patient via telephone today and have left HIPAA compliant voicemail asking patient to return my call.    Follow Up Plan: Will attempt to reach patient by telephone again within the next 30 days  Sharyle Sia, PharmD, [facility] [phone]

## 2023-08-29 ENCOUNTER — Other Ambulatory Visit (INDEPENDENT_AMBULATORY_CARE_PROVIDER_SITE_OTHER): Admitting: Pharmacist

## 2023-08-29 DIAGNOSIS — N183 Chronic kidney disease, stage 3 unspecified: Secondary | ICD-10-CM

## 2023-08-29 DIAGNOSIS — I129 Hypertensive chronic kidney disease with stage 1 through stage 4 chronic kidney disease, or unspecified chronic kidney disease: Secondary | ICD-10-CM

## 2023-08-29 NOTE — Progress Notes (Signed)
 08/29/2023 Name: Charles Ewing MRN: [id number] DOB: 01/20/1941  Chief Complaint  Patient presents with   Medication Management    Charles Ewing is a 83 y.o. year old male who presented for a telephone visit.   They were referred to the pharmacist by their PCP for assistance in managing hypertension and medication access.      Subjective:   Care Team: Primary Care Provider: Edman Marsa PARAS, DO ; Next Scheduled Visit: 01/06/2024 Cardiologist: Clarisa Therisa Ip, PA; Next Scheduled Visit: 11/20/2023 Pain Management: Tanya Glisson, MD; Next Scheduled Visit: 11/18/2023 Urologist: Twylla Glendia BROCKS, MD; Next Scheduled Visit: 10/17/2023 Nurse Care Manager: Karoline Lima, RN Social Worker: Clement Odor, [place]  Medication Access/Adherence  Current Pharmacy:  [facility] - [place], [place] - [address] [address] Phone: [phone] Fax: [phone]   Patient reports affordability concerns with their medications: No  Patient reports access/transportation concerns to their pharmacy: No  Patient reports adherence concerns with their medications:  No     Reports using weekly pillbox to organize his medications.  - Patient fills his own pillbox, but that son picks up medications from pharmacy for him      Hypertension:   Current medications:  - losartan  25 mg daily - furosemide  20 mg daily as needed for swelling   Medications previously tried: amlodipine    Patient has an automated, upper arm home BP cuff Denies checking home BP recently; reports has not yet picked up new monitor due to limited transportation (but they now have his truck back). Planning to pick up monitor next week   Reports mostly eats high-sodium foods (frozen dinners and fast food)   Denies symptoms of hypotension such as dizziness/lightheadedness as long as takes positional changes slowly     Objective:  Lab Results  Component  Value Date   CREATININE 1.20 06/24/2023   BUN 20 06/24/2023   NA 139 06/24/2023   K 4.9 06/24/2023   CL 105 06/24/2023   CO2 26 06/24/2023    Lab Results  Component Value Date   CHOL 90 06/24/2023   HDL 48 06/24/2023   LDLCALC 26 06/24/2023   TRIG 75 06/24/2023   CHOLHDL 1.9 06/24/2023   BP Readings from Last 3 Encounters:  08/20/23 135/74  07/01/23 (!) 100/50  05/28/23 107/64   Pulse Readings from Last 3 Encounters:  08/20/23 91  07/01/23 67  05/28/23 65     Current Outpatient Medications on File Prior to Visit  Medication Sig Dispense Refill   albuterol  (VENTOLIN  HFA) 108 (90 Base) MCG/ACT inhaler Inhale 2 puffs into the lungs every 6 (six) hours as needed for wheezing or shortness of breath. 18 g 1 RF   BREZTRI  AEROSPHERE 160-9-4.8 MCG/ACT AERO Inhale 2 puffs into the lungs 2 (two) times daily. 10.7 g 11   cyanocobalamin  (VITAMIN B12) 1000 MCG tablet Take 1,000 mcg by mouth daily.     ferrous sulfate 325 (65 FE) MG tablet Take 325 mg by mouth daily with breakfast.     FLUoxetine  (PROZAC ) 20 MG capsule TAKE ONE CAPSULE BY MOUTH TWICE A DAY 180 capsule 3   fluticasone  (FLONASE ) 50 MCG/ACT nasal spray PLACE 2 SPRAYS INTO BOTH NOSTRILS DAILY 16 g 3   furosemide  (LASIX ) 20 MG tablet Take 1 tablet (20 mg total) by mouth daily as needed. 90 tablet 0   LORazepam  (ATIVAN ) 0.5 MG tablet TAKE 1 TABLET BY MOUTH AT BEDTIME 30 tablet 2   losartan  (  COZAAR ) 25 MG tablet Take 1 tablet (25 mg total) by mouth daily. 90 tablet 3   meloxicam  (MOBIC ) 15 MG tablet TAKE 1 TABLET BY MOUTH DAILY AS NEEDED FOR PAIN 90 tablet 1   [START ON 08/30/2023] oxyCODONE  (OXY IR/ROXICODONE ) 5 MG immediate release tablet Take 1 tablet (5 mg total) by mouth 2 (two) times daily as needed for severe pain (pain score 7-10). Each refill must last 30 days. 60 tablet 0   [START ON 09/29/2023] oxyCODONE  (OXY IR/ROXICODONE ) 5 MG immediate release tablet Take 1 tablet (5 mg total) by mouth 2 (two) times daily as needed  for severe pain (pain score 7-10). Each refill must last 30 days. 60 tablet 0   [START ON 10/29/2023] oxyCODONE  (OXY IR/ROXICODONE ) 5 MG immediate release tablet Take 1 tablet (5 mg total) by mouth 2 (two) times daily as needed for severe pain (pain score 7-10). Each refill must last 30 days. 60 tablet 0   pantoprazole  (PROTONIX ) 20 MG tablet Take 1 tablet by mouth daily.     simvastatin  (ZOCOR ) 20 MG tablet TAKE 1 TABLET BY MOUTH AT BEDTIME 90 tablet 1   tamsulosin  (FLOMAX ) 0.4 MG CAPS capsule Take 1 capsule (0.4 mg total) by mouth daily after breakfast. 90 capsule 3   No current facility-administered medications on file prior to visit.       Assessment/Plan:   Encourage patient to continue using weekly pillbox   Hypertension: - Have reviewed appropriate blood pressure monitoring technique and reviewed goal blood pressure. - Remind patient to take positional changes slowly - Recommended to obtain a new upper arm blood pressure monitor through health plan OTC benefit and restart checking home blood pressure and heart rate, keep log of results and have this record to review at upcoming medical appointments. Patient to contact provider office sooner if needed for readings outside of established parameters or symptoms             Plans to go to store with his brother within the next week to pick up new upper arm BP monitor         Follow Up Plan: Clinical Pharmacist will follow up with patient by telephone within the next 30 days   Sharyle Sia, PharmD, JAQUELINE, CPP Clinical Pharmacist [facility] [phone]

## 2023-08-29 NOTE — Patient Instructions (Signed)
 Goals Addressed             This Visit's Progress    Pharmacy Goals       Check your blood pressure twice weekly, and any time you have concerning symptoms like headache, chest pain, dizziness, shortness of breath, or vision changes.   Our goal is less than 130/80.  To appropriately check your blood pressure, make sure you do the following:  1) Avoid caffeine, exercise, or tobacco products for 30 minutes before checking. Empty your bladder. 2) Sit with your back supported in a flat-backed chair. Rest your arm on something flat (arm of the chair, table, etc). 3) Sit still with your feet flat on the floor, resting, for at least 5 minutes.  4) Check your blood pressure. Take 1-2 readings.  5) Write down these readings and bring with you to any provider appointments.  Bring your home blood pressure machine with you to a provider's office for accuracy comparison at least once a year.   Make sure you take your blood pressure medications before you come to any office visit, even if you were asked to fast for labs.   Our goal bad cholesterol, or LDL, is less than 70 . This is why it is important to continue taking your simvastatin .  Thank you!  Sharyle Sia, PharmD, JAQUELINE, CPP Clinical Pharmacist [facility] [phone]

## 2023-09-09 NOTE — Progress Notes (Signed)
 Complex Care Management Care Guide Note  09/09/2023 Name: BAYAN KUSHNIR MRN: [id number] DOB: Jul 06, 1940  KEAVON SENSING is a 83 y.o. year old male who is a primary care patient of Edman Marsa PARAS, DO and is actively engaged with the care management team. I reached out to Clem GORMAN Moats by phone today to assist with re-scheduling  with the RN Case Manager Licensed Clinical Social Worker.  Follow up plan: Unsuccessful telephone outreach attempt made. A HIPAA compliant phone message was left for the patient providing contact information and requesting a return call.  Jeoffrey Buffalo , RMA     [facility]  [facility], [facility] Guide  Direct Dial: [phone]  Website: [email or web address]

## 2023-09-22 ENCOUNTER — Other Ambulatory Visit

## 2023-09-22 ENCOUNTER — Telehealth: Payer: Self-pay | Admitting: Pharmacist

## 2023-09-22 NOTE — Progress Notes (Signed)
   Outreach Note  09/22/2023 Name: ASHIR KUNZ MRN: [id number] DOB: 09-Oct-1940  Referred by: Edman Marsa PARAS, DO  Was unable to reach patient via telephone today and unable to leave a message as voicemail is not setup   Sharyle Sia, PharmD, JAQUELINE, CPP Clinical Pharmacist [facility] [phone]

## 2023-09-24 DIAGNOSIS — D0339 Melanoma in situ of other parts of face: Secondary | ICD-10-CM | POA: Diagnosis not present

## 2023-09-24 DIAGNOSIS — L814 Other melanin hyperpigmentation: Secondary | ICD-10-CM | POA: Diagnosis not present

## 2023-09-24 DIAGNOSIS — Z85828 Personal history of other malignant neoplasm of skin: Secondary | ICD-10-CM | POA: Diagnosis not present

## 2023-09-24 DIAGNOSIS — L578 Other skin changes due to chronic exposure to nonionizing radiation: Secondary | ICD-10-CM | POA: Diagnosis not present

## 2023-09-29 ENCOUNTER — Other Ambulatory Visit

## 2023-09-29 ENCOUNTER — Telehealth: Payer: Self-pay | Admitting: Pharmacist

## 2023-09-29 NOTE — Progress Notes (Signed)
   Outreach Note  09/29/2023 Name: Charles Ewing MRN: [id number] DOB: [birth year]  Referred by: Edman Marsa PARAS, DO  Was unable to reach patient via telephone today and unable to leave a message as voicemail is not setup   Sharyle Sia, PharmD, JAQUELINE, CPP Clinical Pharmacist [facility] [phone]

## 2023-10-03 ENCOUNTER — Other Ambulatory Visit

## 2023-10-03 ENCOUNTER — Telehealth: Payer: Self-pay | Admitting: Pharmacist

## 2023-10-03 NOTE — Progress Notes (Signed)
   Outreach Note  10/03/2023 Name: Charles Ewing MRN: [id number] DOB: 05-18-1940  Referred by: Edman Marsa PARAS, DO  Was unable to reach patient via telephone today and have left HIPAA compliant voicemail asking patient to return my call. (unsuccessful outreach #3)  Will notify PCP of unsuccessful attempts to reach patient  Sharyle Sia, PharmD, JAQUELINE, CPP Clinical Pharmacist [facility] [phone]

## 2023-10-09 DIAGNOSIS — B379 Candidiasis, unspecified: Secondary | ICD-10-CM | POA: Diagnosis not present

## 2023-10-09 DIAGNOSIS — H6122 Impacted cerumen, left ear: Secondary | ICD-10-CM | POA: Diagnosis not present

## 2023-10-09 DIAGNOSIS — H9212 Otorrhea, left ear: Secondary | ICD-10-CM | POA: Diagnosis not present

## 2023-10-14 ENCOUNTER — Other Ambulatory Visit: Payer: Self-pay | Admitting: Family Medicine

## 2023-10-14 DIAGNOSIS — F419 Anxiety disorder, unspecified: Secondary | ICD-10-CM

## 2023-10-14 DIAGNOSIS — F4310 Post-traumatic stress disorder, unspecified: Secondary | ICD-10-CM

## 2023-10-15 ENCOUNTER — Other Ambulatory Visit

## 2023-10-15 DIAGNOSIS — C61 Malignant neoplasm of prostate: Secondary | ICD-10-CM

## 2023-10-15 DIAGNOSIS — R972 Elevated prostate specific antigen [PSA]: Secondary | ICD-10-CM

## 2023-10-15 NOTE — Telephone Encounter (Signed)
 Requested Prescriptions  Pending Prescriptions Disp Refills   fluticasone  (FLONASE ) 50 MCG/ACT nasal spray [Pharmacy Med Name: FLUTICASONE  PROPIONATE 50 MCG/ACT N] 16 g 0    Sig: PLACE 2 SPRAYS INTO BOTH NOSTRILS DAILY     Ear, Nose, and Throat: Nasal Preparations - Corticosteroids Passed - 10/15/2023 12:46 PM      Passed - Valid encounter within last 12 months    Recent Outpatient Visits           3 months ago Annual physical exam   [place] [facility] [place], Marsa PARAS, DO   6 months ago Centrilobular emphysema [facility])   [place] [facility] Edman Marsa PARAS, DO       Future Appointments             In 2 days Stoioff, Glendia BROCKS, MD [facility] [place]             FLUoxetine  (PROZAC ) 20 MG capsule [Pharmacy Med Name: FLUOXETINE  HCL 20 MG CAP] 180 capsule 0    Sig: TAKE 1 CAPSULE BY MOUTH TWICE DAILY     Psychiatry:  Antidepressants - SSRI Passed - 10/15/2023 12:46 PM      Passed - Completed PHQ-2 or PHQ-9 in the last 360 days      Passed - Valid encounter within last 6 months    Recent Outpatient Visits           3 months ago Annual physical exam   [facility] [place], Marsa PARAS, DO   6 months ago Centrilobular emphysema [facility])   [place] [facility] Edman Marsa PARAS, DO       Future Appointments             In 2 days Stoioff, Glendia BROCKS, MD [facility] [place]

## 2023-10-16 LAB — PSA: Prostate Specific Ag, Serum: 15 ng/mL — ABNORMAL HIGH (ref 0.0–4.0)

## 2023-10-17 ENCOUNTER — Ambulatory Visit (INDEPENDENT_AMBULATORY_CARE_PROVIDER_SITE_OTHER): Admitting: Urology

## 2023-10-17 ENCOUNTER — Encounter: Payer: Self-pay | Admitting: Urology

## 2023-10-17 VITALS — BP 107/73 | HR 77 | Ht 71.5 in | Wt 240.0 lb

## 2023-10-17 DIAGNOSIS — C61 Malignant neoplasm of prostate: Secondary | ICD-10-CM | POA: Diagnosis not present

## 2023-10-17 NOTE — Progress Notes (Signed)
 10/17/2023 9:41 AM   Charles Ewing [birth year] [id number]  Referring provider: Edman Marsa PARAS, DO [address] [place],  [place] [address]  Chief Complaint  Patient presents with   Elevated PSA   Urologic history: 1.  Prostate cancer PSA has been as high as 8.19 February 2015.  PSA in 2020 was 6.8 and 2022 was 8.29. 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 Based on age he elected surveillance  HPI: Charles Ewing is a 83 y.o. male who presents for 18-month follow-up.  PSA was inadvertently ordered April 2025 and was 15.4; follow-up PSA 10/15/2023 was 15.0 No bothersome LUTS No new bony pain  PMH: Past Medical History:  Diagnosis Date   Abdominal aortic aneurysm (AAA) (HCC) 2013   found at [facility]-   Anxiety    Arrhythmia    Arthritis    Colon polyp    COPD (chronic obstructive pulmonary disease) (HCC)    Coronary artery disease    Depression    GERD (gastroesophageal reflux disease)    Hyperlipidemia    Hypertension    Skin cancer    Thrush     Surgical History: Past Surgical History:  Procedure Laterality Date   CATARACT EXTRACTION W/PHACO Left 05/09/2020   Procedure: CATARACT EXTRACTION PHACO AND INTRAOCULAR LENS PLACEMENT (IOC) LEFT DIABETIC 9.09 01:02.0;  Surgeon: Jaye Fallow, MD;  [facility];  Service: Ophthalmology;  Laterality: Left;   CATARACT EXTRACTION W/PHACO Right 05/23/2020   Procedure: CATARACT EXTRACTION PHACO AND INTRAOCULAR LENS PLACEMENT (IOC) RIGHT DIABETIC 8.13 01:00.3;  Surgeon: Jaye Fallow, MD;  Location: [facility];  Service: Ophthalmology;  Laterality: Right;   Colon polyp surgery     SKIN CANCER EXCISION     TRANSURETHRAL RESECTION OF PROSTATE  2010    Home Medications:  Allergies as of 10/17/2023       Reactions   Nexium [esomeprazole  Magnesium]         Medication List        Accurate as of October 17, 2023  9:41 AM. If you have any questions, ask your nurse or doctor.          albuterol  108 (90 Base) MCG/ACT inhaler Commonly known as: VENTOLIN  HFA Inhale 2 puffs into the lungs every 6 (six) hours as needed for wheezing or shortness of breath.   Breztri  Aerosphere 160-9-4.8 MCG/ACT Aero inhaler Generic drug: budesonide-glycopyrrolate-formoterol Inhale 2 puffs into the lungs 2 (two) times daily.   cyanocobalamin  1000 MCG tablet Commonly known as: VITAMIN B12 Take 1,000 mcg by mouth daily.   ferrous sulfate 325 (65 FE) MG tablet Take 325 mg by mouth daily with breakfast.   FLUoxetine  20 MG capsule Commonly known as: PROZAC  TAKE 1 CAPSULE BY MOUTH TWICE DAILY   fluticasone  50 MCG/ACT nasal spray Commonly known as: FLONASE  PLACE 2 SPRAYS INTO BOTH NOSTRILS DAILY   furosemide  20 MG tablet Commonly known as: LASIX  Take 1 tablet (20 mg total) by mouth daily as needed.   LORazepam  0.5 MG tablet Commonly known as: ATIVAN  TAKE 1 TABLET BY MOUTH AT BEDTIME   losartan  25 MG tablet Commonly known as: COZAAR  Take 1 tablet (25 mg total) by mouth daily.   meloxicam  15 MG tablet Commonly known as: MOBIC  TAKE 1 TABLET BY MOUTH DAILY AS NEEDED FOR PAIN   oxyCODONE   5 MG immediate release tablet Commonly known as: Oxy IR/ROXICODONE  Take 1 tablet (5 mg total) by mouth 2 (two) times daily as needed for severe pain (pain score 7-10). Each refill must last 30 days.   oxyCODONE  5 MG immediate release tablet Commonly known as: Oxy IR/ROXICODONE  Take 1 tablet (5 mg total) by mouth 2 (two) times daily as needed for severe pain (pain score 7-10). Each refill must last 30 days.   oxyCODONE  5 MG immediate release tablet Commonly known as: Oxy IR/ROXICODONE  Take 1 tablet (5 mg total) by mouth 2 (two) times daily as needed for severe pain (pain score 7-10). Each refill must last 30 days. Start taking on: October 29, 2023   pantoprazole  20 MG tablet Commonly known as: PROTONIX  Take 1 tablet by mouth daily.   simvastatin  20 MG tablet Commonly known as: ZOCOR  TAKE 1 TABLET BY MOUTH AT BEDTIME   tamsulosin  0.4 MG Caps capsule Commonly known as: FLOMAX  Take 1 capsule (0.4 mg total) by mouth daily after breakfast.        Allergies:  Allergies  Allergen Reactions   Nexium [Esomeprazole Magnesium]     Family History: Family History  Problem Relation Age of Onset   Kidney cancer Mother    Prostate cancer Neg Hx    Bladder Cancer Neg Hx     Social History:  reports that he has been smoking cigarettes. He has a 70 pack-year smoking history. He has never used smokeless tobacco. He reports that he does not drink alcohol and does not use drugs.   Physical Exam: BP 107/73   Pulse 77   Ht 5' 11.5 (1.816 m)   Wt 240 lb (108.9 kg)   SpO2 97%   BMI 33.01 kg/m   Constitutional:  Alert, No acute distress. HEENT: [place] AT Respiratory: Normal respiratory effort, no increased work of breathing. Psychiatric: Normal mood and affect.   Assessment & Plan:    1.  T1c favorable intermediate prostate cancer Slight PSA rise 15 We again reviewed options of continued observation.  Radiation and ADT were also discussed Also discussed GPS on his prostate pathology and he would like to proceed with further molecular evaluation   Glendia JAYSON Barba, MD  [facility] [address], [address] [place], [place] [address] [phone]

## 2023-10-24 ENCOUNTER — Other Ambulatory Visit: Payer: Self-pay | Admitting: Family Medicine

## 2023-10-24 DIAGNOSIS — G8929 Other chronic pain: Secondary | ICD-10-CM

## 2023-10-24 NOTE — Telephone Encounter (Signed)
 Requested Prescriptions  Pending Prescriptions Disp Refills   meloxicam  (MOBIC ) 15 MG tablet [Pharmacy Med Name: MELOXICAM  15 MG TAB] 90 tablet 1    Sig: TAKE 1 TABLET BY MOUTH DAILY AS NEEDED FOR PAIN     Analgesics:  COX2 Inhibitors Failed - 10/24/2023  5:11 PM      Failed - Manual Review: Labs are only required if the patient has taken medication for more than 8 weeks.      Failed - HGB in normal range and within 360 days    Hemoglobin  Date Value Ref Range Status  06/24/2023 10.5 (L) 13.2 - 17.1 g/dL Final  95/92/7982 85.3 12.6 - 17.7 g/dL Final         Failed - HCT in normal range and within 360 days    HCT  Date Value Ref Range Status  06/24/2023 35.2 (L) 38.5 - 50.0 % Final   Hematocrit  Date Value Ref Range Status  05/19/2015 44.5 37.5 - 51.0 % Final         Failed - eGFR is 30 or above and within 360 days    GFR, Est African American  Date Value Ref Range Status  01/11/2019 64 > OR = 60 mL/min/1.72m2 Final   GFR, Est Non African American  Date Value Ref Range Status  01/11/2019 55 (L) > OR = 60 mL/min/1.62m2 Final   GFR, Estimated  Date Value Ref Range Status  12/23/2020 >60 >60 mL/min Final    Comment:    (NOTE) Calculated using the CKD-EPI Creatinine Equation (2021)    eGFR  Date Value Ref Range Status  06/28/2022 62 > OR = 60 mL/min/1.6m2 Final  06/13/2021 62 >59 mL/min/1.73 Final         Passed - Cr in normal range and within 360 days    Creat  Date Value Ref Range Status  06/24/2023 1.20 0.70 - 1.22 mg/dL Final         Passed - AST in normal range and within 360 days    AST  Date Value Ref Range Status  06/24/2023 12 10 - 35 U/L Final   SGOT(AST)  Date Value Ref Range Status  10/15/2013 15 15 - 37 Unit/L Final         Passed - ALT in normal range and within 360 days    ALT  Date Value Ref Range Status  06/24/2023 9 9 - 46 U/L Final   SGPT (ALT)  Date Value Ref Range Status  10/15/2013 27 U/L Final    Comment:    14-63 NOTE: New  Reference Range 08/31/13          Passed - Patient is not pregnant      Passed - Valid encounter within last 12 months    Recent Outpatient Visits           3 months ago Annual physical exam   [place] [facility] Edman Marsa PARAS, DO   7 months ago Centrilobular emphysema [facility])   [place] [facility], Marsa PARAS, [place]

## 2023-10-28 ENCOUNTER — Other Ambulatory Visit: Payer: Self-pay | Admitting: Family Medicine

## 2023-10-28 DIAGNOSIS — E785 Hyperlipidemia, unspecified: Secondary | ICD-10-CM

## 2023-10-30 NOTE — Telephone Encounter (Signed)
 Requested Prescriptions  Pending Prescriptions Disp Refills   simvastatin  (ZOCOR ) 20 MG tablet [Pharmacy Med Name: SIMVASTATIN  20 MG TAB] 90 tablet 2    Sig: TAKE 1 TABLET BY MOUTH AT BEDTIME     Cardiovascular:  Antilipid - Statins Failed - 10/30/2023 10:06 AM      Failed - Lipid Panel in normal range within the last 12 months    Cholesterol  Date Value Ref Range Status  06/24/2023 90 <200 mg/dL Final   LDL Cholesterol (Calc)  Date Value Ref Range Status  06/24/2023 26 mg/dL (calc) Final    Comment:    Reference range: <100 . Desirable range <100 mg/dL for primary prevention;   <70 mg/dL for patients with CHD or diabetic patients  with > or = 2 CHD risk factors. SABRA LDL-C is now calculated using the Martin-Hopkins  calculation, which is a validated novel method providing  better accuracy than the Friedewald equation in the  estimation of LDL-C.  Gladis APPLETHWAITE et al. SANDREA. 7986;689(80): 2061-2068  (http://education.[employer].com/faq/FAQ164)    HDL  Date Value Ref Range Status  06/24/2023 48 > OR = 40 mg/dL Final   Triglycerides  Date Value Ref Range Status  06/24/2023 75 <150 mg/dL Final         Passed - Patient is not pregnant      Passed - Valid encounter within last 12 months    Recent Outpatient Visits           4 months ago Annual physical exam   [place] [facility] Edman Marsa PARAS, DO   7 months ago Centrilobular emphysema [facility])   [place] [facility] [place], Marsa PARAS, [place]

## 2023-10-31 ENCOUNTER — Ambulatory Visit: Admitting: Family Medicine

## 2023-11-18 ENCOUNTER — Ambulatory Visit (HOSPITAL_BASED_OUTPATIENT_CLINIC_OR_DEPARTMENT_OTHER): Admitting: Nurse Practitioner

## 2023-11-18 DIAGNOSIS — G894 Chronic pain syndrome: Secondary | ICD-10-CM

## 2023-11-18 DIAGNOSIS — Z91199 Patient's noncompliance with other medical treatment and regimen due to unspecified reason: Secondary | ICD-10-CM

## 2023-11-18 NOTE — Progress Notes (Signed)
 11/18/2023-No show

## 2023-11-19 ENCOUNTER — Telehealth: Payer: Self-pay | Admitting: Urology

## 2023-11-19 NOTE — Telephone Encounter (Signed)
 Based on molecular testing report active surveillance (monitoring) is still an excellent option.  If he desires treatment would recommend radiation therapy.  If you would like more information on radiation can place a radiation oncology referral.  If he desires active surveillance recommend follow-up visit March 2026 with a PSA prior

## 2023-11-19 NOTE — Telephone Encounter (Signed)
 Patient called and is wanting to know what treatment will be done for his prostate cancer. Patient states that a sample was sent out and has not yet heard anything. Please advise patient.

## 2023-11-20 ENCOUNTER — Ambulatory Visit
Admission: RE | Admit: 2023-11-20 | Discharge: 2023-11-20 | Disposition: A | Source: Ambulatory Visit | Attending: Nurse Practitioner | Admitting: Nurse Practitioner

## 2023-11-20 ENCOUNTER — Ambulatory Visit: Admitting: Nurse Practitioner

## 2023-11-20 ENCOUNTER — Ambulatory Visit
Admission: RE | Admit: 2023-11-20 | Discharge: 2023-11-20 | Disposition: A | Discharge status: Home / Self Care | Source: Ambulatory Visit | Attending: Nurse Practitioner | Admitting: Nurse Practitioner

## 2023-11-20 ENCOUNTER — Encounter: Payer: Self-pay | Admitting: Nurse Practitioner

## 2023-11-20 VITALS — BP 122/66 | HR 83 | Temp 97.3°F | Resp 18 | Ht 71.0 in | Wt 240.0 lb

## 2023-11-20 DIAGNOSIS — Z79899 Other long term (current) drug therapy: Secondary | ICD-10-CM | POA: Diagnosis not present

## 2023-11-20 DIAGNOSIS — I10 Essential (primary) hypertension: Secondary | ICD-10-CM | POA: Diagnosis not present

## 2023-11-20 DIAGNOSIS — M461 Sacroiliitis, not elsewhere classified: Secondary | ICD-10-CM

## 2023-11-20 DIAGNOSIS — M160 Bilateral primary osteoarthritis of hip: Secondary | ICD-10-CM

## 2023-11-20 DIAGNOSIS — E785 Hyperlipidemia, unspecified: Secondary | ICD-10-CM | POA: Diagnosis not present

## 2023-11-20 DIAGNOSIS — M79604 Pain in right leg: Secondary | ICD-10-CM

## 2023-11-20 DIAGNOSIS — Z79891 Long term (current) use of opiate analgesic: Secondary | ICD-10-CM | POA: Diagnosis not present

## 2023-11-20 DIAGNOSIS — R0602 Shortness of breath: Secondary | ICD-10-CM | POA: Diagnosis not present

## 2023-11-20 DIAGNOSIS — I714 Abdominal aortic aneurysm, without rupture, unspecified: Secondary | ICD-10-CM | POA: Diagnosis not present

## 2023-11-20 DIAGNOSIS — J449 Chronic obstructive pulmonary disease, unspecified: Secondary | ICD-10-CM | POA: Diagnosis not present

## 2023-11-20 DIAGNOSIS — M4186 Other forms of scoliosis, lumbar region: Secondary | ICD-10-CM

## 2023-11-20 DIAGNOSIS — G8929 Other chronic pain: Secondary | ICD-10-CM

## 2023-11-20 DIAGNOSIS — M47816 Spondylosis without myelopathy or radiculopathy, lumbar region: Secondary | ICD-10-CM | POA: Diagnosis not present

## 2023-11-20 DIAGNOSIS — M25552 Pain in left hip: Secondary | ICD-10-CM | POA: Diagnosis not present

## 2023-11-20 DIAGNOSIS — I2510 Atherosclerotic heart disease of native coronary artery without angina pectoris: Secondary | ICD-10-CM | POA: Diagnosis not present

## 2023-11-20 DIAGNOSIS — M25551 Pain in right hip: Secondary | ICD-10-CM | POA: Diagnosis not present

## 2023-11-20 DIAGNOSIS — G894 Chronic pain syndrome: Secondary | ICD-10-CM | POA: Diagnosis not present

## 2023-11-20 DIAGNOSIS — M545 Low back pain, unspecified: Secondary | ICD-10-CM | POA: Insufficient documentation

## 2023-11-20 DIAGNOSIS — M51369 Other intervertebral disc degeneration, lumbar region without mention of lumbar back pain or lower extremity pain: Secondary | ICD-10-CM | POA: Diagnosis not present

## 2023-11-20 DIAGNOSIS — Z720 Tobacco use: Secondary | ICD-10-CM | POA: Diagnosis not present

## 2023-11-20 MED ORDER — NALOXONE HCL 4 MG/0.1ML NA LIQD: 1.0000 | NASAL | 1 refills | Status: AC | PRN

## 2023-11-20 MED ORDER — OXYCODONE HCL 5 MG PO TABS: 5.0000 mg | ORAL_TABLET | Freq: Two times a day (BID) | ORAL | 0 refills | Status: DC | PRN

## 2023-11-20 NOTE — Progress Notes (Signed)
 PROVIDER NOTE: Interpretation of information contained herein should be left to medically-trained personnel. Specific patient instructions are provided elsewhere under Patient Instructions section of medical record. This document was created in part using AI and STT-dictation technology, any transcriptional errors that may result from this process are unintentional.  Patient: Charles Ewing  Service: E/M   PCP: Edman Marsa PARAS, DO  DOB: March 18, 1940  DOS: 11/20/2023  Provider: Emmy MARLA Blanch, NP  MRN: [id number]  Delivery: Face-to-face  Specialty: Interventional Pain Management  Type: Established Patient  Setting: Ambulatory outpatient facility  Specialty designation: 09  Referring Prov.: Edman Marsa *  Location: Outpatient office facility       History of present illness (HPI) Charles Ewing, a 83 y.o. year old male, is here today because of his left hip pain and low back pain Charles Ewing primary complain today is Hip Pain (left)  Pertinent problems: Charles Ewing  has Osteoarthritis of knee (Bilateral); Bilateral lower extremity edema; Chronic pain syndrome; DDD (degenerative disc disease), lumbosacral; and Osteopenia of the lumbar spine on their pertinent problem list.  Pain Assessment: Severity of Chronic pain is reported as a 10-Worst pain ever/10. Location: Hip Left/left leg to the foot. Onset: More than a month ago. Quality: Sharp. Timing: Constant. Modifying factor(s): nothing. Vitals:  height is 5' 11 (1.803 m) and weight is 240 lb (108.9 kg). His temporal temperature is 97.3 F (36.3 C) (abnormal). His blood pressure is 122/66 and his pulse is 83. His respiration is 18 and oxygen saturation is 97%.  BMI: Estimated body mass index is 33.47 kg/m as calculated from the following:   Height as of this encounter: 5' 11 (1.803 m).   Weight as of this encounter: 240 lb (108.9 kg).  Last encounter: 11/18/2023. Last procedure: Visit date not found.  Reason for encounter:  medication management. The patient is doing well with current medication regimen.  No adverse reaction or side effects reported to the medication.    The patient complains of chronic low back pain, pain radiating down to the right leg to approximately the knee, along with the left hip pain.  However the current medication regimen is providing adequate pain relief and maintaining functional mobility.  The patient continues to experience chronic left hip pain, worsening more now then before.  We discussed to order left hip x-rays to further evaluate.  Pharmacotherapy Assessment   Oxycodone  (Oxy IR/Roxicodone ) 5 mg immediate release 2 times daily as needed for severe pain. MME=15  Monitoring: [place] PMP: PDMP reviewed during this encounter.       Pharmacotherapy: No side-effects or adverse reactions reported. Compliance: No problems identified. Effectiveness: Clinically acceptable.  Shela Reda CROME, RN  11/20/2023  2:15 PM  Sign when Signing Visit Nursing Pain Medication Assessment:  Safety precautions to be maintained throughout the outpatient stay will include: orient to surroundings, keep bed in low position, maintain call bell within reach at all times, provide assistance with transfer out of bed and ambulation.  Medication Inspection Compliance: Pill count conducted under aseptic conditions, in front of the patient. Neither the pills nor the bottle was removed from the patient's sight at any time. Once count was completed pills were immediately returned to the patient in their original bottle.  Medication: Oxycodone  IR Pill/Patch Count: 20 of 60 pills/patches remain Pill/Patch Appearance: Markings consistent with prescribed medication Bottle Appearance: Standard pharmacy container. Clearly labeled. Filled Date: 09 / 17 / 2025 Last Medication intake:  TodaySafety precautions to be maintained throughout the  outpatient stay will include: orient to surroundings, keep bed in low position, maintain  call bell within reach at all times, provide assistance with transfer out of bed and ambulation.     UDS:  Summary  Date Value Ref Range Status  08/20/2023 FINAL  Final    Comment:    ==================================================================== [facility] (MW) ==================================================================== Test                             Result       Flag       Units  Drug Present and Declared for Prescription Verification   Lorazepam                       65           EXPECTED   ng/mg creat    Source of lorazepam  is a scheduled prescription medication.    Oxycodone                       688          EXPECTED   ng/mg creat   Oxymorphone                    33           EXPECTED   ng/mg creat   Noroxycodone                   1414         EXPECTED   ng/mg creat   Noroxymorphone                 33           EXPECTED   ng/mg creat    Sources of oxycodone  are scheduled prescription medications.    Oxymorphone, noroxycodone, and noroxymorphone are expected    metabolites of oxycodone . Oxymorphone is also available as a    scheduled prescription medication.  ==================================================================== Test                      Result    Flag   Units      Ref Range   Creatinine              181              mg/dL      >=79 ==================================================================== Declared Medications:  The flagging and interpretation on this report are based on the  following declared medications.  Unexpected results may arise from  inaccuracies in the declared medications.   **Note: The testing scope of this panel includes these medications:   Lorazepam  (Ativan )  Oxycodone    **Note: The testing scope of this panel does not include the  following reported medications:   Albuterol  (Ventolin  HFA)  Budesonide (Breztri  Aerosphere)  Fluoxetine  (Prozac )  Fluticasone  (Flonase )  Formoterol (Breztri  Aerosphere)   Furosemide  (Lasix )  Glycopyrrolate (Breztri  Aerosphere)  Iron  Losartan  (Cozaar )  Meloxicam  (Mobic )  Pantoprazole  (Protonix )  Simvastatin  (Zocor )  Tamsulosin  (Flomax )  Vitamin B12 ==================================================================== For clinical consultation, please call [phone]. ====================================================================     No results found for: CBDTHCR No results found for: D8THCCBX No results found for: D9THCCBX  ROS  Constitutional: Denies any fever or chills Gastrointestinal: No reported hemesis, hematochezia, vomiting, or acute GI distress Musculoskeletal: Left hip pain, low back pain Neurological: No reported episodes of acute onset apraxia,  aphasia, dysarthria, agnosia, amnesia, paralysis, loss of coordination, or loss of consciousness  Medication Review  FLUoxetine , LORazepam , albuterol , budesonide-glycopyrrolate-formoterol, cyanocobalamin , ferrous sulfate, fluticasone , furosemide , losartan , meloxicam , naloxone , oxyCODONE , pantoprazole , simvastatin , and tamsulosin   History Review  Allergy: Charles Ewing is allergic to nexium [esomeprazole magnesium]. Drug: Charles Ewing  reports no history of drug use. Alcohol:  reports no history of alcohol use. Tobacco:  reports that he has been smoking cigarettes. He has a 70 pack-year smoking history. He has never used smokeless tobacco. Social: Charles Ewing  reports that he has been smoking cigarettes. He has a 70 pack-year smoking history. He has never used smokeless tobacco. He reports that he does not drink alcohol and does not use drugs. Medical:  has a past medical history of Abdominal aortic aneurysm (AAA) (2013), Anxiety, Arrhythmia, Arthritis, Colon polyp, COPD (chronic obstructive pulmonary disease) (HCC), Coronary artery disease, Depression, GERD (gastroesophageal reflux disease), Hyperlipidemia, Hypertension, Skin cancer, and Thrush. Surgical: Charles Ewing  has a past surgical  history that includes Skin cancer excision; Colon polyp surgery; Transurethral resection of prostate (2010); Cataract extraction w/PHACO (Left, 05/09/2020); and Cataract extraction w/PHACO (Right, 05/23/2020). Family: family history includes Kidney cancer in his mother.  Laboratory Chemistry Profile   Renal Lab Results  Component Value Date   BUN 20 06/24/2023   CREATININE 1.20 06/24/2023   BCR SEE NOTE: 06/24/2023   GFRAA 64 01/11/2019   GFRNONAA >60 12/23/2020    Hepatic Lab Results  Component Value Date   AST 12 06/24/2023   ALT 9 06/24/2023   ALBUMIN 3.9 06/13/2021   ALKPHOS 70 06/13/2021    Electrolytes Lab Results  Component Value Date   NA 139 06/24/2023   K 4.9 06/24/2023   CL 105 06/24/2023   CALCIUM  8.8 06/24/2023   MG 2.3 06/13/2021    Bone Lab Results  Component Value Date   VD25OH 39 06/28/2022   25OHVITD1 14 (L) 06/13/2021   25OHVITD2 1.8 06/13/2021   25OHVITD3 12 06/13/2021    Inflammation (CRP: Acute Phase) (ESR: Chronic Phase) Lab Results  Component Value Date   CRP 1 06/13/2021   ESRSEDRATE 20 06/13/2021         Note: Above Lab results reviewed.  Recent Imaging Review  MR PROSTATE W WO CONTRAST CLINICAL DATA:  Elevated PSA level.  R97.20.  EXAM: MR PROSTATE WITHOUT AND WITH CONTRAST  TECHNIQUE: Multiplanar multisequence MRI images were obtained of the pelvis centered about the prostate. Pre and post contrast images were obtained.  CONTRAST:  9mL GADAVIST  GADOBUTROL  1 MMOL/ML IV SOLN  COMPARISON:  None Available.  FINDINGS: Prostate: Encapsulated nodularity in the transition zone compatible with benign prostatic hypertrophy. Cystic degeneration of nodularity at the posterior base.  Hazy low T2 signal in the peripheral zone is nonfocal, likely postinflammatory, and is considered PI-RADS category 2. Low T1 signal favoring scarring at the left posterolateral apex as on image 26 of series 3 -2.  Region of interest # 1: Rads category  4 lesion of the right posterolateral posteromedial peripheral zone the apex with focally reduced T2 signal (image 59, series 9) corresponding to reduced ADC map activity and focally restricted diffusion (image 20 of series 7 and 8). This measures 0.42 cc (1.2 by 0.4 by 0.8 cm).  Volume: 3D volumetric analysis: Prostate volume 91.8 cc (5.8 by 5.7 by 6.6 cm).  Transcapsular spread:  Absent  Seminal vesicle involvement: Absent  Neurovascular bundle involvement: Absent  Pelvic adenopathy: Absent  Bone metastasis: Absent  Other findings: No other significant  findings.  IMPRESSION: 1. PI-RADS category 4 lesion of the right posterolateral posteromedial peripheral zone at the apex. Targeting data sent to [facility]. 2. Scarring at the left posterolateral apex. 3. Benign prostatic hypertrophy and prostatomegaly.  Electronically Signed   By: Ryan Salvage M.D.   On: 11/04/2022 15:24 Note: Reviewed        Physical Exam  Vitals: BP 122/66 (Cuff Size: Normal)   Pulse 83   Temp (!) 97.3 F (36.3 C) (Temporal)   Resp 18   Ht 5' 11 (1.803 m)   Wt 240 lb (108.9 kg)   SpO2 97%   BMI 33.47 kg/m  BMI: Estimated body mass index is 33.47 kg/m as calculated from the following:   Height as of this encounter: 5' 11 (1.803 m).   Weight as of this encounter: 240 lb (108.9 kg). Ideal: Ideal body weight: 75.3 kg (166 lb 0.1 oz) Adjusted ideal body weight: 88.7 kg (195 lb 9.7 oz) General appearance: Well nourished, well developed, and well hydrated. In no apparent acute distress Mental status: Alert, oriented x 3 (person, place, & time)       Respiratory: No evidence of acute respiratory distress Eyes: PERLA  Musculoskeletal: Left hip pain worse with sitting, walking, prolonged standing Assessment   Diagnosis Status  1. Chronic pain syndrome   2. Osteoarthritis of hips (Bilateral)   3. Pharmacologic therapy   4. Chronic low back pain (1ry area of Pain) (Bilateral) (R>L) w/o  sciatica   5. Chronic hip pain (2ry area of Pain) (Right)   6. Chronic lower extremity pain (3ry area of Pain) (Right)   7. Lumbar facet syndrome (Right)   8. Osteoarthritis of sacroiliac joints (HCC) (Bilateral)   9. Dextroscoliosis of lumbar spine   10. Chronic use of opiate for therapeutic purpose    Controlled Controlled Controlled   Updated Problems: Problem  Chronic hip pain (2ry area of Pain) (Right)    Plan of Care  Problem-specific:  Assessment and Plan  Chronic pain syndrome: Patient's pain is well-controlled with oxycodone , will continue on current medication regimen.  Prescribing drug monitoring (PMP) reviewed, assistant with the use of prescribed medication and no evidence of narcotic misuse or abuse.  Urine drug screening (UDS) up to date and consistent with the use of prescribed medication.  Schedule follow-up in 90 days for medication management.  Plan: Order left hip x-ray for further evaluation    Charles Ewing has a current medication list which includes the following long-term medication(s): albuterol , ferrous sulfate, fluoxetine , fluticasone , furosemide , losartan , simvastatin , [START ON 11/28/2023] oxycodone , [START ON 12/28/2023] oxycodone , and [START ON 01/27/2024] oxycodone .  Pharmacotherapy (Medications Ordered): Meds ordered this encounter  Medications   oxyCODONE  (OXY IR/ROXICODONE ) 5 MG immediate release tablet    Sig: Take 1 tablet (5 mg total) by mouth 2 (two) times daily as needed for severe pain (pain score 7-10). Each refill must last 30 days.    Dispense:  60 tablet    Refill:  0    DO NOT: delete (not duplicate); no partial-fill (will deny script to complete), no refill request (F/U required). DISPENSE: 1 day early if closed on fill date. WARN: No CNS-depressants within 8 hrs of med.   oxyCODONE  (OXY IR/ROXICODONE ) 5 MG immediate release tablet    Sig: Take 1 tablet (5 mg total) by mouth 2 (two) times daily as needed for severe pain (pain  score 7-10). Each refill must last 30 days.    Dispense:  60 tablet  Refill:  0    DO NOT: delete (not duplicate); no partial-fill (will deny script to complete), no refill request (F/U required). DISPENSE: 1 day early if closed on fill date. WARN: No CNS-depressants within 8 hrs of med.   oxyCODONE  (OXY IR/ROXICODONE ) 5 MG immediate release tablet    Sig: Take 1 tablet (5 mg total) by mouth 2 (two) times daily as needed for severe pain (pain score 7-10). Each refill must last 30 days.    Dispense:  60 tablet    Refill:  0    DO NOT: delete (not duplicate); no partial-fill (will deny script to complete), no refill request (F/U required). DISPENSE: 1 day early if closed on fill date. WARN: No CNS-depressants within 8 hrs of med.   naloxone  (NARCAN ) nasal spray 4 mg/0.1 mL    Sig: Place 1 spray into the nose as needed for up to 365 doses (for opioid-induced respiratory depresssion). In case of emergency (overdose), spray once into each nostril. If no response within 3 minutes, repeat application and call 911.    Dispense:  1 each    Refill:  1    Instruct patient in proper use of device.   Orders:  Orders Placed This Encounter  Procedures   DG HIP UNILAT W OR W/O PELVIS 2-3 VIEWS LEFT    Please describe any evidence of DJD, such as joint narrowing, asymmetry, cysts, or any anomalies in bone density, production, or erosion.    Standing Status:   Future    Number of Occurrences:   1    Expiration Date:   02/20/2024    Scheduling Instructions:     Please make sure that the patient understands that this needs to be done as soon as possible. Never have the patient do the imaging just before the next appointment. Inform patient that having the imaging done within the [facility] will expedite the availability of the results and will provide      imaging availability to the requesting physician. In addition inform the patient that the imaging order has an expiration date and will not be renewed if  not done within the active period.    Reason for Exam (SYMPTOM  OR DIAGNOSIS REQUIRED):   Left hip pain/arthralgia    Preferred imaging location?:   [facility]    Call Results- Best Contact Number?:   [phone] [facility]    Release to patient:   Immediate      Return in about 1 week (around 11/27/2023) for (VV), review of ordered tests, Emmy Blanch NP.    Recent Visits Date Type Provider Dept  11/18/23 Office Visit Khadar Monger K, NP [facility]  Showing recent visits within past 90 days and meeting all other requirements Today's Visits Date Type Provider Dept  11/20/23 Office Visit Jachob Mcclean K, NP [facility]  Showing today's visits and meeting all other requirements Future Appointments Date Type Provider Dept  11/26/23 Appointment Jinan Biggins K, NP [facility]  02/16/24 Appointment Alhassan Everingham K, NP [facility]  Showing future appointments within next 90 days and meeting all other requirements  I discussed the assessment and treatment plan with the patient. The patient was provided an opportunity to ask questions and all were answered. The patient agreed with the plan and demonstrated an understanding of the instructions.  Patient advised to call back or seek an in-person evaluation if the symptoms or condition worsens.  Duration of encounter: 30 minutes.  Total time on encounter, as per AMA guidelines included both the face-to-face and non-face-to-face time personally spent by the physician and/or other qualified health care professional(s) on the day of the encounter (includes time in activities that require the physician or other qualified health care professional and does not include time in activities normally performed by clinical staff). Physician's time may include the following activities when performed: Preparing to see the patient (e.g., pre-charting review of  records, searching for previously ordered imaging, lab work, and nerve conduction tests) Review of prior analgesic pharmacotherapies. Reviewing PMP Interpreting ordered tests (e.g., lab work, imaging, nerve conduction tests) Performing post-procedure evaluations, including interpretation of diagnostic procedures Obtaining and/or reviewing separately obtained history Performing a medically appropriate examination and/or evaluation Counseling and educating the patient/family/caregiver Ordering medications, tests, or procedures Referring and communicating with other health care professionals (when not separately reported) Documenting clinical information in the electronic or other health record Independently interpreting results (not separately reported) and communicating results to the patient/ family/caregiver Care coordination (not separately reported)  Note by: Donnella Morford K Tessla Spurling, NP (TTS and AI technology used. I apologize for any typographical errors that were not detected and corrected.) Date: 11/20/2023; Time: 3:14 PM

## 2023-11-20 NOTE — Progress Notes (Signed)
 Nursing Pain Medication Assessment:  Safety precautions to be maintained throughout the outpatient stay will include: orient to surroundings, keep bed in low position, maintain call bell within reach at all times, provide assistance with transfer out of bed and ambulation.  Medication Inspection Compliance: Pill count conducted under aseptic conditions, in front of the patient. Neither the pills nor the bottle was removed from the patient's sight at any time. Once count was completed pills were immediately returned to the patient in their original bottle.  Medication: Oxycodone  IR Pill/Patch Count: 20 of 60 pills/patches remain Pill/Patch Appearance: Markings consistent with prescribed medication Bottle Appearance: Standard pharmacy container. Clearly labeled. Filled Date: 09 / 17 / 2025 Last Medication intake:  TodaySafety precautions to be maintained throughout the outpatient stay will include: orient to surroundings, keep bed in low position, maintain call bell within reach at all times, provide assistance with transfer out of bed and ambulation.

## 2023-11-20 NOTE — Telephone Encounter (Signed)
Left message to return the call

## 2023-11-20 NOTE — Patient Instructions (Signed)

## 2023-11-25 NOTE — Telephone Encounter (Signed)
 Patient states he will follow in march of 2026 with psa and office visit.

## 2023-11-26 ENCOUNTER — Ambulatory Visit: Attending: Nurse Practitioner | Admitting: Nurse Practitioner

## 2023-11-26 DIAGNOSIS — M160 Bilateral primary osteoarthritis of hip: Secondary | ICD-10-CM

## 2023-11-26 DIAGNOSIS — M5417 Radiculopathy, lumbosacral region: Secondary | ICD-10-CM | POA: Diagnosis not present

## 2023-11-26 DIAGNOSIS — M25551 Pain in right hip: Secondary | ICD-10-CM | POA: Diagnosis not present

## 2023-11-26 DIAGNOSIS — G894 Chronic pain syndrome: Secondary | ICD-10-CM | POA: Diagnosis not present

## 2023-11-26 DIAGNOSIS — M461 Sacroiliitis, not elsewhere classified: Secondary | ICD-10-CM

## 2023-11-26 DIAGNOSIS — G8929 Other chronic pain: Secondary | ICD-10-CM

## 2023-11-26 NOTE — Progress Notes (Signed)
 PROVIDER NOTE: Interpretation of information contained herein should be left to medically-trained personnel. Specific patient instructions are provided elsewhere under Patient Instructions section of medical record. This document was created in part using AI and STT-dictation technology, any transcriptional errors that may result from this process are unintentional.  Patient: Charles Ewing  Service: E/M   PCP: Edman Marsa PARAS, DO  DOB: January 12, 1941  DOS: 11/26/2023  Provider: Emmy MARLA Blanch, NP  MRN: [id number]  Delivery: Virtual Visit  Specialty: Interventional Pain Management  Type: Established Patient  Setting: Ambulatory outpatient facility  Specialty designation: 09  Referring Prov.: Edman Marsa *  Location: Remote location       Virtual Encounter - Pain Management PROVIDER NOTE: Information contained herein reflects review and annotations entered in association with encounter. Interpretation of such information and data should be left to medically-trained personnel. Information provided to patient can be located elsewhere in the medical record under Patient Instructions. Document created using STT-dictation technology, any transcriptional errors that may result from process are unintentional.    Contact & Pharmacy Preferred: [phone] Home: [phone] (home) Mobile: [phone] (mobile) E-mail: No e-mail address on record  [facility] - [place], [place] - [address] [place] [address] Phone: [phone] Fax: [phone]   Pre-screening  Mr. Radin offered in-person vs virtual encounter. He indicated preferring virtual for this encounter.   Reason COVID-19*  Social distancing based on CDC and AMA recommendations.   I contacted Charles Ewing on 11/26/2023 via telephone.      I clearly identified myself as Emmy MARLA Blanch, NP. I verified that I was speaking with the correct person using two identifiers (Name:  VALERIE FREDIN, and date of birth: 12/17/1940).  Consent I sought verbal advanced consent from Charles Ewing for virtual visit interactions. I informed Mr. Teicher of possible security and privacy concerns, risks, and limitations associated with providing not-in-person medical evaluation and management services. I also informed Mr. Talton of the availability of in-person appointments. Finally, I informed him that there would be a charge for the virtual visit and that he could be  personally, fully or partially, financially responsible for it. Mr. Strubel expressed understanding and agreed to proceed.   Historic Elements   Mr. OLUWATOMISIN DEMAN is a 83 y.o. year old, male patient evaluated today after our last contact on 11/20/2023. Mr. Samples  has a past medical history of Abdominal aortic aneurysm (AAA) (2013), Anxiety, Arrhythmia, Arthritis, Colon polyp, COPD (chronic obstructive pulmonary disease) (HCC), Coronary artery disease, Depression, GERD (gastroesophageal reflux disease), Hyperlipidemia, Hypertension, Skin cancer, and Thrush. He also  has a past surgical history that includes Skin cancer excision; Colon polyp surgery; Transurethral resection of prostate (2010); Cataract extraction w/PHACO (Left, 05/09/2020); and Cataract extraction w/PHACO (Right, 05/23/2020). Mr. Stradling has a current medication list which includes the following prescription(s): albuterol , breztri  aerosphere, cyanocobalamin , ferrous sulfate, fluoxetine , fluticasone , furosemide , lorazepam , losartan , meloxicam , naloxone , [START ON 11/28/2023] oxycodone , [START ON 12/28/2023] oxycodone , [START ON 01/27/2024] oxycodone , pantoprazole , simvastatin , and tamsulosin . He  reports that he has been smoking cigarettes. He has a 70 pack-year smoking history. He has never used smokeless tobacco. He reports that he does not drink alcohol and does not use drugs. Mr. Furtick is allergic to nexium [esomeprazole magnesium].  BMI: Estimated body mass index is  33.47 kg/m as calculated from the following:   Height as of 11/20/23: 5' 11 (1.803 m).   Weight as of 11/20/23: 240 lb (108.9 kg). Last  encounter: 11/20/2023. Last procedure: Visit date not found.  HPI  Today, he is being contacted for follow-up evaluation.  The patient was contacted for evaluation of his left hip x-rays, which were ordered during his previous visit.  Imaging findings are consistent with hip arthritis.  He continues to experience persistent left hip pain.  We discussed proceeding with a left hip injection, and the procedure will be scheduled with Dr. Tanya.  He is not taking any anticoagulants.   Pharmacotherapy Assessment  Monitoring: [place] PMP: PDMP not reviewed this encounter.       Pharmacotherapy: No side-effects or adverse reactions reported. Compliance: No problems identified. Effectiveness: Clinically acceptable. Plan: Refer to POC.  UDS:  Summary  Date Value Ref Range Status  08/20/2023 FINAL  Final    Comment:    ==================================================================== [employer] [facility] (MW) ==================================================================== Test                             Result       Flag       Units  Drug Present and Declared for Prescription Verification   Lorazepam                       65           EXPECTED   ng/mg creat    Source of lorazepam  is a scheduled prescription medication.    Oxycodone                       688          EXPECTED   ng/mg creat   Oxymorphone                    33           EXPECTED   ng/mg creat   Noroxycodone                   1414         EXPECTED   ng/mg creat   Noroxymorphone                 33           EXPECTED   ng/mg creat    Sources of oxycodone  are scheduled prescription medications.    Oxymorphone, noroxycodone, and noroxymorphone are expected    metabolites of oxycodone . Oxymorphone is also available as a    scheduled prescription  medication.  ==================================================================== Test                      Result    Flag   Units      Ref Range   Creatinine              181              mg/dL      >=79 ==================================================================== Declared Medications:  The flagging and interpretation on this report are based on the  following declared medications.  Unexpected results may arise from  inaccuracies in the declared medications.   **Note: The testing scope of this panel includes these medications:   Lorazepam  (Ativan )  Oxycodone    **Note: The testing scope of this panel does not include the  following reported medications:   Albuterol  (Ventolin  HFA)  Budesonide (Breztri  Aerosphere)  Fluoxetine  (Prozac )  Fluticasone  (Flonase )  Formoterol (Breztri  Aerosphere)  Furosemide  (Lasix )  Glycopyrrolate (  Breztri  Aerosphere)  Iron  Losartan  (Cozaar )  Meloxicam  (Mobic )  Pantoprazole  (Protonix )  Simvastatin  (Zocor )  Tamsulosin  (Flomax )  Vitamin B12 ==================================================================== For clinical consultation, please call [phone]. ====================================================================    No results found for: CBDTHCR, D8THCCBX, D9THCCBX  Laboratory Chemistry Profile   Renal Lab Results  Component Value Date   BUN 20 06/24/2023   CREATININE 1.20 06/24/2023   BCR SEE NOTE: 06/24/2023   GFRAA 64 01/11/2019   GFRNONAA >60 12/23/2020    Hepatic Lab Results  Component Value Date   AST 12 06/24/2023   ALT 9 06/24/2023   ALBUMIN 3.9 06/13/2021   ALKPHOS 70 06/13/2021    Electrolytes Lab Results  Component Value Date   NA 139 06/24/2023   K 4.9 06/24/2023   CL 105 06/24/2023   CALCIUM  8.8 06/24/2023   MG 2.3 06/13/2021    Bone Lab Results  Component Value Date   VD25OH 39 06/28/2022   25OHVITD1 14 (L) 06/13/2021   25OHVITD2 1.8 06/13/2021   25OHVITD3 12 06/13/2021     Inflammation (CRP: Acute Phase) (ESR: Chronic Phase) Lab Results  Component Value Date   CRP 1 06/13/2021   ESRSEDRATE 20 06/13/2021         Note: Above Lab results reviewed.  Imaging  DG HIP UNILAT W OR W/O PELVIS 2-3 VIEWS LEFT CLINICAL DATA:  Left hip pain  EXAM: DG HIP (WITH OR WITHOUT PELVIS) 2-3V LEFT  COMPARISON:  06/13/2021  FINDINGS: There is no evidence of hip fracture or dislocation. Mild bilateral hip joint space narrowing, similar to prior. Partially visualized old healed posttraumatic deformity of the left femoral shaft. Degenerative disc disease of the included lower lumbar spine. Atherosclerotic vascular calcifications.  IMPRESSION: Mild bilateral hip osteoarthritis, similar to prior. No acute findings.  Electronically Signed   By: Mabel Converse D.O.   On: 11/26/2023 10:35  Assessment  The primary encounter diagnosis was Osteoarthritis of hips (Bilateral). 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  Plan of Care  Problem-specific:  Osteoarthritis of Left hip:  Imaging findings are consistent with hip arthritis.  He continues to experience persistent left hip pain.  We discussed proceeding with a left hip injection, and the procedure will be scheduled with Dr. Tanya.  Plan:   Mr. DALESSANDRO BALDYGA has a current medication list which includes the following long-term medication(s): albuterol , ferrous sulfate, fluoxetine , fluticasone , furosemide , losartan , [START ON 11/28/2023] oxycodone , [START ON 12/28/2023] oxycodone , [START ON 01/27/2024] oxycodone , and simvastatin .  Pharmacotherapy (Medications Ordered): No orders of the defined types were placed in this encounter.  Orders:  Orders Placed This Encounter  Procedures   HIP INJECTION    Standing Status:   Future    Expiration  Date:   02/26/2024    Scheduling Instructions:     Procedure: Intra-articular Hip joint injection     Side: Left-sided     Approach: Posterior     Sedation: Patient's choice.     Timeframe: As soon as schedule allows   Follow-up plan:   Return in about 2 weeks (around 12/10/2023) for [facility]): (L) IA-hip injection # 1 with Dr. Tanya.      Recent Visits Date Type Provider Dept  11/20/23 Office Visit Tylena Prisk K, NP [facility]  Showing recent visits within past 90 days and meeting all other requirements Today's Visits Date Type Provider Dept  11/26/23 Office Visit Robley Matassa K, NP [facility]  Showing today's visits and meeting all other requirements Future Appointments Date Type Provider Dept  02/16/24 Appointment Maricia Scotti K, NP [facility]  Showing future appointments within next 90 days and meeting all other requirements  I discussed the assessment and treatment plan with the patient. The patient was provided an opportunity to ask questions and all were answered. The patient agreed with the plan and demonstrated an understanding of the instructions.  Patient advised to call back or seek an in-person evaluation if the symptoms or condition worsens.  Duration of encounter: 15 minutes.  Note by: Emmy MARLA Blanch, NP Date: 11/26/2023; Time: 2:42 PM

## 2023-11-27 NOTE — Patient Instructions (Signed)

## 2023-12-08 ENCOUNTER — Other Ambulatory Visit: Payer: Self-pay | Admitting: Family Medicine

## 2023-12-08 DIAGNOSIS — J432 Centrilobular emphysema: Secondary | ICD-10-CM

## 2023-12-10 NOTE — Telephone Encounter (Signed)
 Requested Prescriptions  Pending Prescriptions Disp Refills   fluticasone  (FLONASE ) 50 MCG/ACT nasal spray [Pharmacy Med Name: FLUTICASONE  PROPIONATE 50 MCG/ACT N] 16 g 0    Sig: PLACE 2 SPRAYS INTO BOTH NOSTRILS DAILY     Ear, Nose, and Throat: Nasal Preparations - Corticosteroids Passed - 12/10/2023  9:35 AM      Passed - Valid encounter within last 12 months    Recent Outpatient Visits           5 months ago Annual physical exam   [facility] [place], Marsa PARAS, DO   8 months ago Centrilobular emphysema [facility])   [facility] [place] Marsa PARAS, DO       Future Appointments             In 4 months Stoioff, Glendia BROCKS, MD [facility] [place]             albuterol  (VENTOLIN  HFA) 108 [phone] Base) MCG/ACT inhaler [Pharmacy Med Name: ALBUTEROL  SULFATE HFA 108 (90 BASE)] 8.5 g 0    Sig: INHALE 2 PUFFS INTO THE LUNGS EVERY 6 HOURS AS NEEDED FOR WHEEZING OR SHORTNESS OF BREATH     Pulmonology:  [facility] 2 Passed - 12/10/2023  9:35 AM      Passed - Last BP in normal range    BP Readings from Last 1 Encounters:  11/20/23 122/66         Passed - Last Heart Rate in normal range    Pulse Readings from Last 1 Encounters:  11/20/23 83         Passed - Valid encounter within last 12 months    Recent Outpatient Visits           5 months ago Annual physical exam   [facility] [facility] [place], Marsa PARAS, DO   8 months ago Centrilobular emphysema [facility])   [place] [facility] Edman Marsa PARAS, DO       Future Appointments             In 4 months Stoioff, Glendia BROCKS, MD [facility] [place]

## 2023-12-11 ENCOUNTER — Ambulatory Visit (HOSPITAL_BASED_OUTPATIENT_CLINIC_OR_DEPARTMENT_OTHER): Admitting: Pain Medicine

## 2023-12-11 ENCOUNTER — Ambulatory Visit
Admission: RE | Admit: 2023-12-11 | Discharge: 2023-12-11 | Disposition: A | Discharge status: Home / Self Care | Source: Ambulatory Visit | Attending: Pain Medicine | Admitting: Pain Medicine

## 2023-12-11 ENCOUNTER — Encounter: Payer: Self-pay | Admitting: Pain Medicine

## 2023-12-11 VITALS — BP 135/70 | HR 75 | Temp 97.6°F | Resp 15 | Ht 71.0 in | Wt 220.0 lb

## 2023-12-11 DIAGNOSIS — M160 Bilateral primary osteoarthritis of hip: Secondary | ICD-10-CM | POA: Insufficient documentation

## 2023-12-11 DIAGNOSIS — M25552 Pain in left hip: Secondary | ICD-10-CM

## 2023-12-11 DIAGNOSIS — G8929 Other chronic pain: Secondary | ICD-10-CM | POA: Insufficient documentation

## 2023-12-11 MED ORDER — LIDOCAINE HCL 2 % IJ SOLN
20.0000 mL | Freq: Once | INTRAMUSCULAR | Status: AC
  Administered 2023-12-11: 400 mg
  Filled 2023-12-11: qty 20

## 2023-12-11 MED ORDER — PENTAFLUOROPROP-TETRAFLUOROETH EX AERO: INHALATION_SPRAY | Freq: Once | CUTANEOUS | Status: AC

## 2023-12-11 MED ORDER — IOHEXOL 180 MG/ML  SOLN
10.0000 mL | Freq: Once | INTRAMUSCULAR | Status: AC
  Administered 2023-12-11: 10 mL via INTRA_ARTICULAR
  Filled 2023-12-11: qty 20

## 2023-12-11 MED ORDER — METHYLPREDNISOLONE ACETATE 80 MG/ML IJ SUSP
80.0000 mg | Freq: Once | INTRAMUSCULAR | Status: AC
  Administered 2023-12-11: 80 mg via INTRA_ARTICULAR
  Filled 2023-12-11: qty 1

## 2023-12-11 MED ORDER — MIDAZOLAM HCL (PF) 2 MG/2ML IJ SOLN
0.5000 mg | Freq: Once | INTRAMUSCULAR | Status: AC
  Administered 2023-12-11: 1 mg via INTRAVENOUS
  Filled 2023-12-11: qty 2

## 2023-12-11 MED ORDER — ROPIVACAINE HCL 2 MG/ML IJ SOLN
9.0000 mL | Freq: Once | INTRAMUSCULAR | Status: AC
  Administered 2023-12-11: 9 mL via INTRA_ARTICULAR
  Filled 2023-12-11: qty 20

## 2023-12-11 NOTE — Progress Notes (Signed)
 PROVIDER NOTE: Interpretation of information contained herein should be left to medically-trained personnel. Specific patient instructions are provided elsewhere under Patient Instructions section of medical record. This document was created in part using STT-dictation technology, any transcriptional errors that may result from this process are unintentional.  Patient: Charles Ewing Type: Established DOB: [birth year] MRN: [id number] PCP: Edman Marsa PARAS, DO  Service: Procedure DOS: 12/11/2023 Setting: Ambulatory Location: Ambulatory outpatient facility Delivery: Face-to-face Provider: Eric DELENA Como, MD Specialty: Interventional Pain Management Specialty designation: 09 Location: Outpatient facility Ref. Prov.: Edman Marsa *       Interventional Therapy   Procedure: Intra-articular hip injection  #1  Laterality: Left (-LT)  Approach: Percutaneous posterolateral approach. Level: Lower pelvic and hip joint level.  Imaging: Fluoroscopy-guided Non-spinal ([id number]) Anesthesia: Local anesthesia (1-2% Lidocaine ) Anxiolysis: IV Versed  1.0 mg Sedation: No Sedation                       DOS: 12/11/2023  Performed by: Eric DELENA Como, MD  Purpose: Diagnostic/Therapeutic Indications: Hip pain severe enough to impact quality of life or function. Rationale (medical necessity): procedure needed and proper for the diagnosis and/or treatment of Charles Ewing medical symptoms and needs. 1. Chronic hip pain (Left)   2. Osteoarthritis of hips (Bilateral)   3. Chronic hip pain, left    NAS-11 Pain score:   Pre-procedure: 9 /10   Post-procedure: 0-No pain/10      Target: Intra-articular hip joint Region: Hip joint, upper (proximal) femoral region Type of procedure: Percutaneous joint injection   Position / Prep / Materials:  Position: Right Lateral Decubitus.  Prep solution: ChloraPrep (2% chlorhexidine gluconate and 70% isopropyl alcohol) Prep Area:  Entire  Posterolateral hip area. Materials:  Tray: Block tray Needle(s):  Type: Spinal  Gauge (G): 22  Length: 7-in  Qty: 1  H&P (Pre-op Assessment):  Charles Ewing is a 83 y.o. (year old), male patient, seen today for interventional treatment. He  has a past surgical history that includes Skin cancer excision; Colon polyp surgery; Transurethral resection of prostate (2010); Cataract extraction w/PHACO (Left, 05/09/2020); and Cataract extraction w/PHACO (Right, 05/23/2020). Charles Ewing has a current medication list which includes the following prescription(s): albuterol , breztri  aerosphere, cyanocobalamin , ferrous sulfate, fluoxetine , fluticasone , furosemide , lorazepam , losartan , meloxicam , naloxone , oxycodone , [START ON 12/28/2023] oxycodone , [START ON 01/27/2024] oxycodone , pantoprazole , simvastatin , and tamsulosin . His primarily concern today is the Hip Pain (left)  Initial Vital Signs:  Pulse/HCG Rate: 71ECG Heart Rate: 79 Temp: 97.6 F (36.4 C) Resp: 18 BP: 114/78 SpO2: 95 %  BMI: Estimated body mass index is 30.68 kg/m as calculated from the following:   Height as of this encounter: 5' 11 (1.803 m).   Weight as of this encounter: 220 lb (99.8 kg).  Risk Assessment: Allergies: Reviewed. He is allergic to nexium [esomeprazole magnesium].  Allergy Precautions: None required Coagulopathies: Reviewed. None identified.  Blood-thinner therapy: None at this time Active Infection(s): Reviewed. None identified. Charles Ewing is afebrile  Site Confirmation: Charles Ewing was asked to confirm the procedure and laterality before marking the site Procedure checklist: Completed Consent: Before the procedure and under the influence of no sedative(s), amnesic(s), or anxiolytics, the patient was informed of the treatment options, risks and possible complications. To fulfill our ethical and legal obligations, as recommended by the American Medical Association's Code of Ethics, I have informed the patient of my  clinical impression; the nature and purpose of the treatment or procedure; the risks, benefits, and possible complications of  the intervention; the alternatives, including doing nothing; the risk(s) and benefit(s) of the alternative treatment(s) or procedure(s); and the risk(s) and benefit(s) of doing nothing. The patient was provided information about the general risks and possible complications associated with the procedure. These may include, but are not limited to: failure to achieve desired goals, infection, bleeding, organ or nerve damage, allergic reactions, paralysis, and death. In addition, the patient was informed of those risks and complications associated to the procedure, such as failure to decrease pain; infection; bleeding; organ or nerve damage with subsequent damage to sensory, motor, and/or autonomic systems, resulting in permanent pain, numbness, and/or weakness of one or several areas of the body; allergic reactions; (i.e.: anaphylactic reaction); and/or death. Furthermore, the patient was informed of those risks and complications associated with the medications. These include, but are not limited to: allergic reactions (i.e.: anaphylactic or anaphylactoid reaction(s)); adrenal axis suppression; blood sugar elevation that in diabetics may result in ketoacidosis or comma; water retention that in patients with history of congestive heart failure may result in shortness of breath, pulmonary edema, and decompensation with resultant heart failure; weight gain; swelling or edema; medication-induced neural toxicity; particulate matter embolism and blood vessel occlusion with resultant organ, and/or nervous system infarction; and/or aseptic necrosis of one or more joints. Finally, the patient was informed that Medicine is not an exact science; therefore, there is also the possibility of unforeseen or unpredictable risks and/or possible complications that may result in a catastrophic outcome. The  patient indicated having understood very clearly. We have given the patient no guarantees and we have made no promises. Enough time was given to the patient to ask questions, all of which were answered to the patient's satisfaction. Mr. Calico has indicated that he wanted to continue with the procedure. Attestation: I, the ordering provider, attest that I have discussed with the patient the benefits, risks, side-effects, alternatives, likelihood of achieving goals, and potential problems during recovery for the procedure that I have provided informed consent. Date  Time: 12/11/2023  2:18 PM  Pre-Procedure Preparation:  Monitoring: As per clinic protocol. Respiration, ETCO2, SpO2, BP, heart rate and rhythm monitor placed and checked for adequate function Safety Precautions: Patient was assessed for positional comfort and pressure points before starting the procedure. Time-out: I initiated and conducted the Time-out before starting the procedure, as per protocol. The patient was asked to participate by confirming the accuracy of the Time Out information. Verification of the correct person, site, and procedure were performed and confirmed by me, the nursing staff, and the patient. Time-out conducted as per Joint Commission's Universal Protocol (UP.01.01.01). Time: 1459 Start Time: 1459 hrs.  Description/Narrative of Procedure:          Rationale (medical necessity): procedure needed and proper for the diagnosis and/or treatment of the patient's medical symptoms and needs. Procedural Technique Safety Precautions: Aspiration looking for blood return was conducted prior to all injections. At no point did we inject any substances, as a needle was being advanced. No attempts were made at seeking any paresthesias. Safe injection practices and needle disposal techniques used. Medications properly checked for expiration dates. SDV (single dose vial) medications used. Description of the Procedure: Protocol  guidelines were followed. The patient was assisted into a comfortable position. The target area was identified and the area prepped in the usual manner. Skin & deeper tissues infiltrated with local anesthetic. Appropriate amount of time allowed to pass for local anesthetics to take effect. The procedure needles were then advanced to the  target area. Proper needle placement secured. Negative aspiration confirmed. Solution injected in intermittent fashion, asking for systemic symptoms every 0.5cc of injectate. The needles were then removed and the area cleansed, making sure to leave some of the prepping solution back to take advantage of its long term bactericidal properties.  Technical description of procedure:  Skin & deeper tissues infiltrated with local anesthetic. Appropriate amount of time allowed to pass for local anesthetics to take effect. The procedure needles were then advanced to the target area. Proper needle placement secured. Negative aspiration confirmed. Solution injected in intermittent fashion, asking for systemic symptoms every 0.5cc of injectate. The needles were then removed and the area cleansed, making sure to leave some of the prepping solution back to take advantage of its long term bactericidal properties.           Vitals:   12/11/23 1455 12/11/23 1500 12/11/23 1510 12/11/23 1515  BP: (!) 141/79 138/81 134/71 135/70  Pulse:   75   Resp: 16 15 16 15   Temp:      TempSrc:      SpO2: 96% 95% 95% 96%  Weight:      Height:         Start Time: 1459 hrs. End Time:   hrs.  Imaging Guidance (Non-Spinal):          Type of Imaging Technique: Fluoroscopy Guidance (Non-Spinal) Indication(s): Fluoroscopy guidance for needle placement to enhance accuracy in procedures requiring precise needle localization for targeted delivery of medication in or near specific anatomical locations not easily accessible without such real-time imaging assistance. Exposure Time: Please see nurses  notes. Contrast: Before injecting any contrast, we confirmed that the patient did not have an allergy to iodine, shellfish, or radiological contrast. Once satisfactory needle placement was completed at the desired level, radiological contrast was injected. Contrast injected under live fluoroscopy. No contrast complications. See chart for type and volume of contrast used. Fluoroscopic Guidance: I was personally present during the use of fluoroscopy. Tunnel Vision Technique used to obtain the best possible view of the target area. Parallax error corrected before commencing the procedure. Direction-depth-direction technique used to introduce the needle under continuous pulsed fluoroscopy. Once target was reached, antero-posterior, oblique, and lateral fluoroscopic projection used confirm needle placement in all planes. Images permanently stored in EMR. Interpretation: I personally interpreted the imaging intraoperatively. Adequate needle placement confirmed in multiple planes. Appropriate spread of contrast into desired area was observed. No evidence of afferent or efferent intravascular uptake. Permanent images saved into the patient's record.  Post-operative Assessment:  Post-procedure Vital Signs:  Pulse/HCG Rate: 7572 Temp: 97.6 F (36.4 C) Resp: 15 BP: 135/70 SpO2: 96 %  EBL: None  Complications: No immediate post-treatment complications observed by team, or reported by patient.  Note: The patient tolerated the entire procedure well. A repeat set of vitals were taken after the procedure and the patient was kept under observation following institutional policy, for this type of procedure. Post-procedural neurological assessment was performed, showing return to baseline, prior to discharge. The patient was provided with post-procedure discharge instructions, including a section on how to identify potential problems. Should any problems arise concerning this procedure, the patient was given  instructions to immediately contact us , at any time, without hesitation. In any case, we plan to contact the patient by telephone for a follow-up status report regarding this interventional procedure.  Comments:  No additional relevant information.  Plan of Care (POC)  Orders:  Orders Placed This Encounter  Procedures  HIP INJECTION    Scheduling Instructions:     Side: Left-sided     Sedation: Patient's choice.     Date: 12/11/2023   DG PAIN [facility] C-ARM 1-60 MIN NO REPORT    Intraoperative interpretation by procedural physician at [facility].    Standing Status:   Standing    Number of Occurrences:   1    Reason for exam::   Assistance in needle guidance and placement for procedures requiring needle placement in or near specific anatomical locations not easily accessible without such assistance.   Informed Consent Details: Physician/Practitioner Attestation; Transcribe to consent form and obtain patient signature    Nursing Order: Transcribe to consent form and obtain patient signature. Note: Always confirm laterality of pain with Mr. Volden, before procedure.    Physician/Practitioner attestation of informed consent for procedure/surgical case:   I, the physician/practitioner, attest that I have discussed with the patient the benefits, risks, side effects, alternatives, likelihood of achieving goals and potential problems during recovery for the procedure that I have provided informed consent.    Procedure:   Hip injection    Physician/Practitioner performing the procedure:   Keairra Bardon A. Kasara Schomer, MD    Indication/Reason:   Hip Joint Pain (Arthralgia)   Provide equipment / supplies at bedside    Procedure tray: Block Tray (Disposable  single use) Skin infiltration needle: Regular 1.5-in, 25-G, (x1) Block Needle type: Spinal Amount/quantity: 1 Size: Long (7-inch) Gauge: 22G    Standing Status:   Standing    Number of Occurrences:   1    Specify:   Block Tray    Saline lock IV    Have LR [id number] mL available and administer at 125 mL/hr if patient becomes hypotensive.    Standing Status:   Standing    Number of Occurrences:   1     Oxycodone  IR 5 mg tablet, 1 tab p.o. 2 times daily (#60) MME/day: 15 mg/day    Medications ordered for procedure: Meds ordered this encounter  Medications   iohexol  (OMNIPAQUE ) 180 MG/ML injection 10 mL    Must be Myelogram-compatible. If not available, you may substitute with a water-soluble, non-ionic, hypoallergenic, myelogram-compatible radiological contrast medium.   lidocaine  (XYLOCAINE ) 2 % (with pres) injection 400 mg   pentafluoroprop-tetrafluoroeth (GEBAUERS) aerosol   midazolam  PF (VERSED ) injection 0.5-2 mg    Make sure Flumazenil is available in the pyxis when using this medication. If oversedation occurs, administer 0.2 mg IV over 15 sec. If after 45 sec no response, administer 0.2 mg again over 1 min; may repeat at 1 min intervals; not to exceed 4 doses (1 mg)   ropivacaine  (PF) 2 mg/mL (0.2%) (NAROPIN ) injection 9 mL   methylPREDNISolone  acetate (DEPO-MEDROL ) injection 80 mg   Medications administered: We administered iohexol , lidocaine , pentafluoroprop-tetrafluoroeth, midazolam  PF, ropivacaine  (PF) 2 mg/mL (0.2%), and methylPREDNISolone  acetate.  See the medical record for exact dosing, route, and time of administration.    Interventional Therapies  Risk Factors  Considerations:   Abdominal aortic aneurysm (AAA)  HTN  Cardiomyopathy  CAD  orthostatic hypotension  Irregular heart rhythm CKD  SOB  Tobacco abuse  COPD  GERD   Planned  Pending:      Under consideration:   Diagnostic/therapeutic right L4-5 LESI #2  Diagnostic/therapeutic bilateral IA hip joint inj. #1    Completed:   Diagnostic/therapeutic left IA hip joint inj. x1 (12/11/2023)  Diagnostic/therapeutic right L4-5 LESI x1 (03/12/2022) (100/100/100/100)  Therapeutic right lumbar facet RFA x1 (10/18/2021) (  100/100/50/50)   Diagnostic right lumbar facet MBB x2 (09/13/2021) (100/100/50/50)    Completed by other providers:   Diagnostic/therapeutic bilateral IA steroid knee inj. (03/15/2016) by Darold Molt, PA [place])    Therapeutic  Palliative (PRN) options:   Therapeutic right lumbar facet MBB       Follow-up plan:   Return in about 2 weeks (around 12/25/2023) for (Face2F), (PPE).     Recent Visits Date Type Provider Dept  11/26/23 Office Visit Patel, Seema K, NP [facility]  11/20/23 Office Visit Patel, Seema K, NP [facility]  Showing recent visits within past 90 days and meeting all other requirements Today's Visits Date Type Provider Dept  12/11/23 Procedure visit Tanya Glisson, MD [facility]  Showing today's visits and meeting all other requirements Future Appointments Date Type Provider Dept  01/05/24 Appointment Tanya Glisson, MD [facility]  02/16/24 Appointment Patel, Seema K, NP [facility]  Showing future appointments within next 90 days and meeting all other requirements   Disposition: Discharge home  Discharge (Date  Time): 12/11/2023; 1511 hrs.   Primary Care Physician: Edman Marsa PARAS, DO Location: [facility] Note by: Glisson DELENA Tanya, MD (TTS technology used. I apologize for any typographical errors that were not detected and corrected.) Date: 12/11/2023; Time: 3:37 PM  Disclaimer:  Medicine is not an visual merchandiser. The only guarantee in medicine is that nothing is guaranteed. It is important to note that the decision to proceed with this intervention was based on the information collected from the patient. The Data and conclusions were drawn from the patient's questionnaire, the interview, and the physical examination. Because the information was provided in large part by the patient, it cannot be guaranteed that it has not been purposely or unconsciously manipulated.  Every effort has been made to obtain as much relevant data as possible for this evaluation. It is important to note that the conclusions that lead to this procedure are derived in large part from the available data. Always take into account that the treatment will also be dependent on availability of resources and existing treatment guidelines, considered by other Pain Management Practitioners as being common knowledge and practice, at the time of the intervention. For Medico-Legal purposes, it is also important to point out that variation in procedural techniques and pharmacological choices are the acceptable norm. The indications, contraindications, technique, and results of the above procedure should only be interpreted and judged by a Board-Certified Interventional Pain Specialist with extensive familiarity and expertise in the same exact procedure and technique.

## 2023-12-11 NOTE — Patient Instructions (Signed)
 ______________________________________________________________________    Post-Procedure Discharge Instructions  INSTRUCTIONS Apply ice:  Purpose: This will minimize any swelling and discomfort after procedure.  When: Day of procedure, as soon as you get home. How: Fill a plastic sandwich bag with crushed ice. Cover it with a small towel and apply to injection site. How long: (15 min on, 15 min off) Apply for 15 minutes then remove x 15 minutes.  Repeat sequence on day of procedure, until you go to bed. Apply heat:  Purpose: To treat any soreness and discomfort from the procedure. When: Starting the next day after the procedure. How: Apply heat to procedure site starting the day following the procedure. How long: May continue to repeat daily, until discomfort goes away. Food intake: Start with clear liquids (like water) and advance to regular food, as tolerated.  Physical activities: Keep activities to a minimum for the first 8 hours after the procedure. After that, then as tolerated. Driving: If you have received any sedation, be responsible and do not drive. You are not allowed to drive for 24 hours after having sedation. Blood thinner: (Applies only to those taking blood thinners) You may restart your blood thinner 6 hours after your procedure. Insulin: (Applies only to Diabetic patients taking insulin) As soon as you can eat, you may resume your normal dosing schedule. Infection prevention: Keep procedure site clean and dry. Shower daily and clean area with soap and water.  PAIN DIARY Post-procedure Pain Diary: Extremely important that this be done correctly and accurately. Recorded information will be used to determine the next step in treatment. For the purpose of accuracy, follow these rules: Evaluate only the area treated. Do not report or include pain from an untreated area. For the purpose of this evaluation, ignore all other areas of pain, except for the treated area. After your  procedure, avoid taking a long nap and attempting to complete the pain diary after you wake up. Instead, set your alarm clock to go off every hour, on the hour, for the initial 8 hours after the procedure. Document the duration of the numbing medicine, and the relief you are getting from it. Do not go to sleep and attempt to complete it later. It will not be accurate. If you received sedation, it is likely that you were given a medication that may cause amnesia. Because of this, completing the diary at a later time may cause the information to be inaccurate. This information is needed to plan your care. Follow-up appointment: Keep your post-procedure follow-up evaluation appointment after the procedure (usually 2 weeks for most procedures, 6 weeks for radiofrequencies). DO NOT FORGET to bring you pain diary with you.   EXPECT... (What should I expect to see with my procedure?) From numbing medicine (AKA: Local Anesthetics): Numbness or decrease in pain. You may also experience some weakness, which if present, could last for the duration of the local anesthetic. Onset: Full effect within 15 minutes of injected. Duration: It will depend on the type of local anesthetic used. On the average, 1 to 8 hours.  From steroids (Applies only if steroids were used): Decrease in swelling or inflammation. Once inflammation is improved, relief of the pain will follow. Onset of benefits: Depends on the amount of swelling present. The more swelling, the longer it will take for the benefits to be seen. In some cases, up to 10 days. Duration: Steroids will stay in the system x 2 weeks. Duration of benefits will depend on multiple posibilities including persistent irritating  factors. Side-effects: If present, they may typically last 2 weeks (the duration of the steroids). Frequent: Cramps (if they occur, drink Gatorade and take over-the-counter Magnesium 450-500 mg once to twice a day); water retention with temporary weight  gain; increases in blood sugar; decreased immune system response; increased appetite. Occasional: Facial flushing (red, warm cheeks); mood swings; menstrual changes. Uncommon: Long-term decrease or suppression of natural hormones; bone thinning. (These are more common with higher doses or more frequent use. This is why we prefer that our patients avoid having any injection therapies in other practices.)  Very Rare: Severe mood changes; psychosis; aseptic necrosis. From procedure: Some discomfort is to be expected once the numbing medicine wears off. This should be minimal if ice and heat are applied as instructed.  CALL IF... (When should I call?) You experience numbness and weakness that gets worse with time, as opposed to wearing off. New onset bowel or bladder incontinence. (Applies only to procedures done in the spine)  Emergency Numbers: Durning business hours (Monday - Thursday, 8:00 AM - 4:00 PM) (Friday, 9:00 AM - 12:00 Noon): [phone] After hours: [phone] NOTE: If you are having a problem and are unable connect with, or to talk to a provider, then go to your nearest urgent care or emergency department. If the problem is serious and urgent, please call 911. ______________________________________________________________________     ______________________________________________________________________    Steroid injections  Common steroids for injections Triamcinolone : Used by many sports medicine physicians for large joint and bursal injections, often combined with a local anesthetic like lidocaine . A study focusing on coccydynia (tailbone pain) found triamcinolone  was more effective than betamethasone, suggesting it may also be preferable for other localized inflammation conditions. Methylprednisolone: A common alternative to triamcinolone  that is also a strong anti-inflammatory. It is available in different formulations, with the acetate suspension being the long-acting  option for intra-articular injections. Dexamethasone : This is a non-particulate steroid, meaning it has a lower risk of tissue damage compared to particulate steroids like triamcinolone  and methylprednisolone. While less common for this specific use, it is an option for targeted injections.   Considerations for physicians Particulate vs. non-particulate steroids: Triamcinolone  and methylprednisolone are particulate, meaning they can clump together. Dexamethasone  is non-particulate. Particulate steroids are often preferred for their longer-lasting effects but carry a theoretical higher risk for certain injections (though this is less of a concern in the costochondral joints). Combined injectate: Corticosteroids are typically mixed with a local anesthetic like lidocaine  to provide both immediate pain relief (from the anesthetic) and longer-term inflammation reduction (from the steroid). Imaging guidance: To ensure accurate placement of the needle and medication, physicians may use ultrasound or fluoroscopic guidance for the injection, especially in complex or refractory cases.   Patient guidance Before undergoing a steroid injection, discuss the options with your physician. They will determine the best steroid, dosage, and procedure for your specific case based on factors like: Severity of your condition History of response to other treatments Your overall health status Experience and preference of the physician  Last  Updated: 10/07/2023 ______________________________________________________________________

## 2023-12-11 NOTE — Progress Notes (Signed)
 Fluro machine not working well. Delay in starting versed . Continued Safety measures. Patient position on side

## 2023-12-28 NOTE — Progress Notes (Signed)
 PROVIDER NOTE: Interpretation of information contained herein should be left to medically-trained personnel. Specific patient instructions are provided elsewhere under Patient Instructions section of medical record. This document was created in part using AI and STT-dictation technology, any transcriptional errors that may result from this process are unintentional.  Patient: Charles Ewing  Service: E/M   PCP: Edman Marsa PARAS, DO  DOB: [birth year]  DOS: 01/05/2024  Provider: Eric DELENA Como, MD  MRN: [id number]  Delivery: Face-to-face  Specialty: Interventional Pain Management  Type: Established Patient  Setting: Ambulatory outpatient facility  Specialty designation: 09  Referring Prov.: Edman Marsa *  Location: Outpatient office facility       History of present illness (HPI) [name], a 83 y.o. year old male, is here today because of his Chronic hip pain, left [M25.552, G89.29]. Charles Ewing primary complain today is Back Pain  Pertinent problems: Mr. Hinze has Osteoarthritis of knees (Bilateral); Bilateral lower extremity edema; Chronic pain syndrome; Chronic hip pain (2ry area of Pain) (Right); Chronic lower extremity pain (3ry area of Pain) (Right); Chronic low back pain (1ry area of Pain) (Bilateral) (R>L) w/o sciatica; Lumbar facet syndrome; Dextroscoliosis of lumbar spine; DDD (degenerative disc disease), lumbosacral; Osteopenia of lumbar spine; Osteopenia determined by x-ray; Osteoarthritis of sacroiliac joints (HCC) (Bilateral); Osteoarthritis of hips (Bilateral); Chronic sacroiliac joint pain (Bilateral); Sacroiliac joint dysfunction (Bilateral); Spondylosis without myelopathy or radiculopathy, lumbosacral region; Other spondylosis, sacral and sacrococcygeal region; Abnormal MRI, lumbar spine (03/11/2022); Chronic low back pain (Bilateral) w/ sciatica (Right); Herniated nucleus pulposus, L4-5 (Right); Lumbosacral radiculopathy at L4 (Right); L4-5 disc bulge  (Right); Spondylolisthesis at L4-L5 level; Grade 1 Anterolisthesis of lumbar spine (L4/L5); Chronic radicular pain of lower extremity (Right); Quadriceps weakness; Lumbar spondylosis; and Chronic hip pain (Left) on their pertinent problem list.  Pain Assessment: Severity of Chronic pain is reported as a 8 /10. Location: Back  /right leg to the ankle. Onset: More than a month ago. Quality: Sharp. Timing: Constant. Modifying factor(s): Oxycodone . Vitals:  height is 5' 11 (1.803 m) and weight is 200 lb (90.7 kg). His temporal temperature is 98.2 F (36.8 C). His blood pressure is 116/86 and his pulse is 71. His respiration is 18 and oxygen saturation is 98%.  BMI: Estimated body mass index is 27.89 kg/m as calculated from the following:   Height as of this encounter: 5' 11 (1.803 m).   Weight as of this encounter: 200 lb (90.7 kg).  Last encounter: 05/28/2023. Last procedure: 12/11/2023.  Reason for encounter: post-procedure evaluation and assessment.   Discussed the use of AI scribe software for clinical note transcription with the patient, who gave verbal consent to proceed.  History of Present Illness          Post-Procedure Evaluation   Procedure: Intra-articular hip injection  #1  Laterality: Left (-LT)  Approach: Percutaneous posterolateral approach. Level: Lower pelvic and hip joint level.  Imaging: Fluoroscopy-guided Non-spinal ([id number]) Anesthesia: Local anesthesia (1-2% Lidocaine ) Anxiolysis: IV Versed  1.0 mg Sedation: No Sedation                       DOS: 12/11/2023  Performed by: Eric DELENA Como, MD  Purpose: Diagnostic/Therapeutic Indications: Hip pain severe enough to impact quality of life or function. Rationale (medical necessity): procedure needed and proper for the diagnosis and/or treatment of Charles Ewing medical symptoms and needs. 1. Chronic hip pain (Left)   2. Osteoarthritis of hips (Bilateral)   3. Chronic hip pain, left  NAS-11 Pain score:    Pre-procedure: 9 /10   Post-procedure: 0-No pain/10       Effectiveness:  Initial hour after procedure: 100 %. Subsequent 4-6 hours post-procedure: 100 %. Analgesia past initial 6 hours: 80 %. Ongoing improvement:  Analgesic: According to the patient he attained 100% relief of the pain for the duration of local anesthetic followed by an ongoing 80% improvement. Function: Charles Ewing reports improvement in function ROM: Charles Ewing reports improvement in ROM  Pharmacotherapy Assessment   Opioid Analgesic: Oxycodone  IR 5 mg tablet, 1 tab p.o. 2 times daily (#60) MME/day: 15 mg/day   Monitoring: [place] PMP: PDMP reviewed during this encounter.       Pharmacotherapy: No side-effects or adverse reactions reported. Compliance: No problems identified. Effectiveness: Clinically acceptable.  No notes on file  UDS:  Summary  Date Value Ref Range Status  08/20/2023 FINAL  Final    Comment:    ==================================================================== [employer] 13 (MW) ==================================================================== Test                             Result       Flag       Units  Drug Present and Declared for Prescription Verification   Lorazepam                       65           EXPECTED   ng/mg creat    Source of lorazepam  is a scheduled prescription medication.    Oxycodone                       688          EXPECTED   ng/mg creat   Oxymorphone                    33           EXPECTED   ng/mg creat   Noroxycodone                   1414         EXPECTED   ng/mg creat   Noroxymorphone                 33           EXPECTED   ng/mg creat    Sources of oxycodone  are scheduled prescription medications.    Oxymorphone, noroxycodone, and noroxymorphone are expected    metabolites of oxycodone . Oxymorphone is also available as a    scheduled prescription medication.  ==================================================================== Test                       Result    Flag   Units      Ref Range   Creatinine              181              mg/dL      >=79 ==================================================================== Declared Medications:  The flagging and interpretation on this report are based on the  following declared medications.  Unexpected results may arise from  inaccuracies in the declared medications.   **Note: The testing scope of this panel includes these medications:   Lorazepam  (Ativan )  Oxycodone    **Note: The testing scope of this panel does not include the  following reported medications:  Albuterol  (Ventolin  HFA)  Budesonide (Breztri  Aerosphere)  Fluoxetine  (Prozac )  Fluticasone  (Flonase )  Formoterol (Breztri  Aerosphere)  Furosemide  (Lasix )  Glycopyrrolate (Breztri  Aerosphere)  Iron  Losartan  (Cozaar )  Meloxicam  (Mobic )  Pantoprazole  (Protonix )  Simvastatin  (Zocor )  Tamsulosin  (Flomax )  Vitamin B12 ==================================================================== For clinical consultation, please call [phone]. ====================================================================     No results found for: CBDTHCR No results found for: D8THCCBX No results found for: D9THCCBX  ROS  Constitutional: Denies any fever or chills Gastrointestinal: No reported hemesis, hematochezia, vomiting, or acute GI distress Musculoskeletal: Denies any acute onset joint swelling, redness, loss of ROM, or weakness Neurological: No reported episodes of acute onset apraxia, aphasia, dysarthria, agnosia, amnesia, paralysis, loss of coordination, or loss of consciousness  Medication Review  FLUoxetine , LORazepam , albuterol , budesonide-glycopyrrolate-formoterol, cyanocobalamin , ferrous sulfate, fluticasone , furosemide , losartan , meloxicam , naloxone , oxyCODONE , pantoprazole , simvastatin , and tamsulosin   History Review  Allergy: Charles Ewing is allergic to nexium [esomeprazole magnesium]. Drug: Charles Ewing   reports no history of drug use. Alcohol:  reports no history of alcohol use. Tobacco:  reports that he has been smoking cigarettes. He has a 70 pack-year smoking history. He has never used smokeless tobacco. Social: Charles Ewing  reports that he has been smoking cigarettes. He has a 70 pack-year smoking history. He has never used smokeless tobacco. He reports that he does not drink alcohol and does not use drugs. Medical:  has a past medical history of Abdominal aortic aneurysm (AAA) (2013), Anxiety, Arrhythmia, Arthritis, Colon polyp, COPD (chronic obstructive pulmonary disease) (HCC), Coronary artery disease, Depression, GERD (gastroesophageal reflux disease), Hyperlipidemia, Hypertension, Skin cancer, and Thrush. Surgical: Charles Ewing  has a past surgical history that includes Skin cancer excision; Colon polyp surgery; Transurethral resection of prostate (2010); Cataract extraction w/PHACO (Left, 05/09/2020); and Cataract extraction w/PHACO (Right, 05/23/2020). Family: family history includes Kidney cancer in his mother.  Laboratory Chemistry Profile   Renal Lab Results  Component Value Date   BUN 20 06/24/2023   CREATININE 1.20 06/24/2023   BCR SEE NOTE: 06/24/2023   GFRAA 64 01/11/2019   GFRNONAA >60 12/23/2020    Hepatic Lab Results  Component Value Date   AST 12 06/24/2023   ALT 9 06/24/2023   ALBUMIN 3.9 06/13/2021   ALKPHOS 70 06/13/2021    Electrolytes Lab Results  Component Value Date   NA 139 06/24/2023   K 4.9 06/24/2023   CL 105 06/24/2023   CALCIUM  8.8 06/24/2023   MG 2.3 06/13/2021    Bone Lab Results  Component Value Date   VD25OH 39 06/28/2022   25OHVITD1 14 (L) 06/13/2021   25OHVITD2 1.8 06/13/2021   25OHVITD3 12 06/13/2021    Inflammation (CRP: Acute Phase) (ESR: Chronic Phase) Lab Results  Component Value Date   CRP 1 06/13/2021   ESRSEDRATE 20 06/13/2021         Note: Above Lab results reviewed.  Recent Imaging Review  DG PAIN CLINIC C-ARM 1-60 MIN  NO REPORT Fluoro was used, but no Radiologist interpretation will be provided.  Please refer to NOTES tab for provider progress note. Note: Reviewed        Physical Exam  Vitals: BP 116/86 (Cuff Size: Normal)   Pulse 71   Temp 98.2 F (36.8 C) (Temporal)   Resp 18   Ht 5' 11 (1.803 m)   Wt 200 lb (90.7 kg)   SpO2 98%   BMI 27.89 kg/m  BMI: Estimated body mass index is 27.89 kg/m as calculated from the following:   Height as of  this encounter: 5' 11 (1.803 m).   Weight as of this encounter: 200 lb (90.7 kg). Ideal: Ideal body weight: 75.3 kg (166 lb 0.1 oz) Adjusted ideal body weight: 81.5 kg (179 lb 9.7 oz) General appearance: Well nourished, well developed, and well hydrated. In no apparent acute distress Mental status: Alert, oriented x 3 (person, place, & time)       Respiratory: No evidence of acute respiratory distress Eyes: PERLA   Assessment   Diagnosis Status  1. Chronic hip pain (Left)   2. Osteoarthritis of hips (Bilateral)   3. Chronic pain syndrome   4. Postop check    Improved Controlled Controlled   Updated Problems: Problem  Gastroesophageal Reflux Disease   Jan 12, 2004 Entered By: FRED SINKS A Comment: on omeprazoleDec 01, 2005 Entered By: MJWHTJOJ,[email or web address] A Comment: 7/01 EGD squamous mucosa bx c/w reflux esophagitis   Jan 12, 2004 Entered By: MJWHTJOJ,QJUPFJ A Comment: on omeprazoleDec 01, 2005 Entered By: MJWHTJOJ,QJUPFJ A Comment: 7/01 EGD squamous mucosa bx c/w reflux esophagitis  Jan 12, 2004 Entered By: MJWHTJOJ,QJUPFJ A Comment: on omeprazole Jan 12, 2004 Entered By: MJWHTJOJ,QJUPFJ A Comment: 7/01 EGD squamous mucosa bx c/w reflux esophagitis   Primary Hypertension   Jan 12, 2004 Entered By: MJWHTJOJ,QJUPFJ A Comment: Echo 2004 EF &gt;55%, mild LVH, mild TR  Jan 12, 2004 Entered By: MJWHTJOJ,QJUPFJ A Comment: Echo 2004 EF &gt;55%, mild LVH, mild TR     Plan of Care  Problem-specific:  Assessment and Plan            Mr.  KHY Ewing has a current medication list which includes the following long-term medication(s): albuterol , ferrous sulfate, fluoxetine , fluticasone , furosemide , losartan , oxycodone , oxycodone , [START ON 01/27/2024] oxycodone , and simvastatin .  Pharmacotherapy (Medications Ordered): No orders of the defined types were placed in this encounter.  Orders:  Orders Placed This Encounter  Procedures   Nursing Instructions:    Please complete this patient's postprocedure evaluation.    Scheduling Instructions:     Please complete this patient's postprocedure evaluation.     Interventional Therapies  Risk Factors  Considerations:   Abdominal aortic aneurysm (AAA)  HTN  Cardiomyopathy  CAD  orthostatic hypotension  Irregular heart rhythm CKD  SOB  Tobacco abuse  COPD  GERD   Planned  Pending:      Under consideration:   Diagnostic/therapeutic right L4-5 LESI #2  Diagnostic/therapeutic bilateral IA hip joint inj. #1    Completed:   Diagnostic/therapeutic left IA hip joint inj. x1 (12/11/2023) (100/100/80/80)  Diagnostic/therapeutic right L4-5 LESI x1 (03/12/2022) (100/100/100/100)  Therapeutic right lumbar facet RFA x1 (10/18/2021) (100/100/50/50)  Diagnostic right lumbar facet MBB x2 (09/13/2021) (100/100/50/50)    Completed by other providers:   Diagnostic/therapeutic bilateral IA steroid knee inj. (03/15/2016) by Darold Molt, PA ([facility])    Therapeutic  Palliative (PRN) options:   Therapeutic right lumbar facet MBB      No follow-ups on file.    Recent Visits Date Type Provider Dept  12/11/23 Procedure visit Tanya Glisson, MD [facility]  11/26/23 Office Visit Patel, Seema K, NP [facility]  11/20/23 Office Visit Patel, Seema K, NP [facility]  Showing recent visits within past 90 days and meeting all other requirements Today's Visits Date Type Provider Dept  01/05/24 Office Visit Tanya Glisson, MD [facility]   Showing today's visits and meeting all other requirements Future Appointments Date Type Provider Dept  02/16/24 Appointment Patel, Seema K, NP [facility]  Showing  future appointments within next 90 days and meeting all other requirements  I discussed the assessment and treatment plan with the patient. The patient was provided an opportunity to ask questions and all were answered. The patient agreed with the plan and demonstrated an understanding of the instructions.  Patient advised to call back or seek an in-person evaluation if the symptoms or condition worsens.  Duration of encounter: 30 minutes.  Total time on encounter, as per AMA guidelines included both the face-to-face and non-face-to-face time personally spent by the physician and/or other qualified health care professional(s) on the day of the encounter (includes time in activities that require the physician or other qualified health care professional and does not include time in activities normally performed by clinical staff). Physician's time may include the following activities when performed: Preparing to see the patient (e.g., pre-charting review of records, searching for previously ordered imaging, lab work, and nerve conduction tests) Review of prior analgesic pharmacotherapies. Reviewing PMP Interpreting ordered tests (e.g., lab work, imaging, nerve conduction tests) Performing post-procedure evaluations, including interpretation of diagnostic procedures Obtaining and/or reviewing separately obtained history Performing a medically appropriate examination and/or evaluation Counseling and educating the patient/family/caregiver Ordering medications, tests, or procedures Referring and communicating with other health care professionals (when not separately reported) Documenting clinical information in the electronic or other health record Independently interpreting results (not separately reported) and communicating  results to the patient/ family/caregiver Care coordination (not separately reported)  Note by: Eric DELENA Como, MD (TTS and AI technology used. I apologize for any typographical errors that were not detected and corrected.) Date: 01/05/2024; Time: 11:59 AM

## 2024-01-05 ENCOUNTER — Ambulatory Visit: Attending: Pain Medicine | Admitting: Pain Medicine

## 2024-01-05 ENCOUNTER — Encounter: Payer: Self-pay | Admitting: Pain Medicine

## 2024-01-05 VITALS — BP 116/86 | HR 71 | Temp 98.2°F | Resp 18 | Ht 71.0 in | Wt 200.0 lb

## 2024-01-05 DIAGNOSIS — Z09 Encounter for follow-up examination after completed treatment for conditions other than malignant neoplasm: Secondary | ICD-10-CM | POA: Insufficient documentation

## 2024-01-05 DIAGNOSIS — M160 Bilateral primary osteoarthritis of hip: Secondary | ICD-10-CM | POA: Insufficient documentation

## 2024-01-05 DIAGNOSIS — M25552 Pain in left hip: Secondary | ICD-10-CM | POA: Insufficient documentation

## 2024-01-05 DIAGNOSIS — G894 Chronic pain syndrome: Secondary | ICD-10-CM | POA: Diagnosis not present

## 2024-01-05 DIAGNOSIS — G8929 Other chronic pain: Secondary | ICD-10-CM | POA: Diagnosis present

## 2024-01-05 DIAGNOSIS — I10 Essential (primary) hypertension: Secondary | ICD-10-CM | POA: Diagnosis present

## 2024-01-06 ENCOUNTER — Other Ambulatory Visit: Payer: Self-pay | Admitting: Family Medicine

## 2024-01-06 ENCOUNTER — Ambulatory Visit (INDEPENDENT_AMBULATORY_CARE_PROVIDER_SITE_OTHER): Admitting: Family Medicine

## 2024-01-06 ENCOUNTER — Encounter: Payer: Self-pay | Admitting: Family Medicine

## 2024-01-06 VITALS — BP 130/68 | HR 72 | Ht 71.0 in | Wt 199.5 lb

## 2024-01-06 DIAGNOSIS — E559 Vitamin D deficiency, unspecified: Secondary | ICD-10-CM

## 2024-01-06 DIAGNOSIS — R7303 Prediabetes: Secondary | ICD-10-CM | POA: Diagnosis not present

## 2024-01-06 DIAGNOSIS — C61 Malignant neoplasm of prostate: Secondary | ICD-10-CM

## 2024-01-06 DIAGNOSIS — F99 Mental disorder, not otherwise specified: Secondary | ICD-10-CM

## 2024-01-06 DIAGNOSIS — I129 Hypertensive chronic kidney disease with stage 1 through stage 4 chronic kidney disease, or unspecified chronic kidney disease: Secondary | ICD-10-CM

## 2024-01-06 DIAGNOSIS — J432 Centrilobular emphysema: Secondary | ICD-10-CM

## 2024-01-06 DIAGNOSIS — N138 Other obstructive and reflux uropathy: Secondary | ICD-10-CM

## 2024-01-06 DIAGNOSIS — N182 Chronic kidney disease, stage 2 (mild): Secondary | ICD-10-CM

## 2024-01-06 DIAGNOSIS — E538 Deficiency of other specified B group vitamins: Secondary | ICD-10-CM

## 2024-01-06 DIAGNOSIS — Z23 Encounter for immunization: Secondary | ICD-10-CM | POA: Diagnosis not present

## 2024-01-06 DIAGNOSIS — Z0000 Encounter for general adult medical examination without abnormal findings: Secondary | ICD-10-CM

## 2024-01-06 DIAGNOSIS — L309 Dermatitis, unspecified: Secondary | ICD-10-CM

## 2024-01-06 DIAGNOSIS — E785 Hyperlipidemia, unspecified: Secondary | ICD-10-CM

## 2024-01-06 DIAGNOSIS — F5105 Insomnia due to other mental disorder: Secondary | ICD-10-CM

## 2024-01-06 DIAGNOSIS — G3184 Mild cognitive impairment, so stated: Secondary | ICD-10-CM

## 2024-01-06 LAB — POCT GLYCOSYLATED HEMOGLOBIN (HGB A1C): Hemoglobin A1C: 5.8 % — AB (ref 4.0–5.6)

## 2024-01-06 MED ORDER — LORAZEPAM 0.5 MG PO TABS
0.5000 mg | ORAL_TABLET | Freq: Every day | ORAL | 2 refills | Status: AC
Start: 2024-01-06 — End: ?

## 2024-01-06 MED ORDER — TRIAMCINOLONE ACETONIDE 0.1 % EX CREA
1.0000 | TOPICAL_CREAM | Freq: Two times a day (BID) | CUTANEOUS | 0 refills | Status: AC
Start: 2024-01-06 — End: ?

## 2024-01-06 NOTE — Progress Notes (Addendum)
 Subjective:    Patient ID: Charles Ewing, male    DOB: 1940-06-26, 83 y.o.   MRN: [id number]  Charles Ewing is a 83 y.o. male presenting on 01/06/2024 for Medical Management of Chronic Issues   HPI  Discussed the use of AI scribe software for clinical note transcription with the patient, who gave verbal consent to proceed.  History of Present Illness   Charles Ewing is an 83 year old male with COPD who presents for a follow-up visit and evaluation of a sore on his elbow.  Elbow lesion of skin - Persistent sore on elbow that is red and painful, especially when resting on surfaces - Frequent picking at the lesion due to suspicion of a foreign body - History of using a black medicine in the past, but unable to locate it currently - No recent use of specific medications for the lesion - No associated pruritus  Centrilobular Emphysema - Currently using Breztri  triple therapy inhaler - Perception of worsening respiratory symptoms - No pulmonologist involved in care - Interest in new medication options Active smoker. 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 eGFR >60   Pre-Diabetes - A1c improved to 5.8 Meds: No longer on medication - Prior Metformin  XR Currently on ARB Denies hypoglycemia, polyuria, visual changes, numbness or tingling.   Chronic Anxiety Associated with PTSD Insomnia Currently controlled on Lorazepam  0.5mg   nightly for insomnia and anxiety Currently on Fluoxetone 20mg  TWICE A DAY   Health Maintenance: Flu Shot today     01/06/2024    6:20 PM 12/11/2023    2:20 PM 11/20/2023    2:14 PM  Depression screen PHQ 2/9  Decreased Interest 0 0 0  Down, Depressed, Hopeless 0 0 0  PHQ - 2 Score 0 0 0       01/06/2024    6:20 PM 12/31/2022    2:35 PM 02/15/2021    9:53 AM 08/26/2018    1:46 PM  GAD 7 : Generalized Anxiety Score  Nervous, Anxious, on Edge 2 3 2 2   Control/stop worrying 2 3 1 3   Worry too much - different things 2 3 1 3   Trouble relaxing 2 3 1 3   Restless 2 3 1 3   Easily annoyed or irritable 2 3 1  0  Afraid - awful might happen 2 3 1 3   Total GAD 7 Score 14 21 8 17   Anxiety Difficulty Somewhat difficult  Not difficult at all Not difficult at all    Social History   Tobacco Use   Smoking status: Every Day    Current packs/day: 1.00    Average packs/day: 1 pack/day for 70.0 years (70.0 ttl pk-yrs)    Types: Cigarettes   Smokeless tobacco: Never  Vaping Use   Vaping status: Never Used  Substance Use Topics   Alcohol use: No   Drug use: No    Review of Systems Per HPI unless specifically indicated above  Objective:    BP 130/68 (BP Location: Left Arm, Patient Position: Sitting, Cuff Size: Normal)   Pulse 72   Ht 5' 11 (1.803 m)   Wt 199 lb 8 oz (90.5 kg)   SpO2 97%   BMI 27.82 kg/m   Wt Readings from Last 3 Encounters:  01/06/24 199 lb 8 oz (90.5 kg)  01/05/24 200 lb (90.7 kg)  12/11/23 220 lb (99.8 kg)    Physical Exam Vitals and nursing note reviewed.  Constitutional:      General: He is not in acute distress.    Appearance: He is well-developed. He is not diaphoretic.     Comments: Elderly 83 yr male, comfortable, cooperative  HENT:     Head: Normocephalic and atraumatic.  Eyes:     General:        Right eye: No discharge.        Left eye: No discharge.     Conjunctiva/sclera: Conjunctivae normal.     Pupils: Pupils are equal, round,  and reactive to light.  Neck:     Thyroid : No thyromegaly.  Cardiovascular:     Rate and Rhythm: Normal rate and regular rhythm.     Pulses: Normal pulses.     Heart sounds: Normal heart sounds. No murmur heard. Pulmonary:     Effort: Pulmonary effort is normal. No respiratory distress.     Breath sounds: No wheezing or rales.     Comments: Mild reduced breath sounds Abdominal:     General: Bowel sounds are normal. There is no distension.     Palpations: Abdomen is soft. There is no mass.     Tenderness: There is no abdominal tenderness.  Musculoskeletal:        General: No tenderness. Normal range of motion.     Cervical back: Normal range of motion and neck supple.     Right lower leg: No edema.     Left lower leg: No edema.     Comments: Upper / Lower Extremities: - Normal muscle tone, strength bilateral upper extremities 5/5, lower extremities 5/5  Using cane for ambulation  Lymphadenopathy:     Cervical: No cervical adenopathy.  Skin:    General: Skin is warm and dry.     Findings: Bruising and lesion (L elbow with superficial abrasion thin skin with healing scab) present. No erythema or rash.  Neurological:     Mental Status: He is alert and oriented to person, place, and time.     Comments: Distal sensation intact to light touch all extremities  Psychiatric:        Mood and Affect: Mood normal.        Behavior: Behavior normal.        Thought Content: Thought content normal.     Comments: Well groomed, good eye contact, normal speech and thoughts     Results for orders placed or performed in visit on 01/06/24  POCT HgB A1C   Collection Time: 01/06/24  3:40 PM  Result Value Ref Range   Hemoglobin A1C 5.8 (A) 4.0 - 5.6 %   HbA1c POC (<> result, manual entry)     HbA1c, POC (prediabetic range)     HbA1c, POC (controlled diabetic range)        Assessment & Plan:   Problem List Items Addressed This Visit     Benign hypertension with CKD (chronic kidney  disease), stage II   Centrilobular emphysema (HCC)   Relevant Orders   Ambulatory referral to Pulmonology  Insomnia   Relevant Medications   LORazepam  (ATIVAN ) 0.5 MG tablet   Pre-diabetes - Primary   Relevant Orders   POCT HgB A1C (Completed)   Other Visit Diagnoses       Needs flu shot       Relevant Orders   Flu vaccine HIGH DOSE PF(Fluzone Trivalent) (Completed)     Prostate cancer (HCC)       Relevant Medications   LORazepam  (ATIVAN ) 0.5 MG tablet     Dermatitis       Relevant Medications   triamcinolone  cream (KENALOG ) 0.1 %       Emphysema Chronic Obstructive Pulmonary Disease (COPD) COPD symptoms worsening despite triple therapy. Breztri  Discussed goal to escalate to pulmonology care - Referred to pulmonologist at [facility] for further evaluation and management.  Prostate Cancer Malignant neoplasm of prostate under active surveillance Prostate cancer under active surveillance due to slow growth and potential treatment side effects. - Continue active surveillance with urologist. Follow PSA - Follow up with urologist in March.  Insomnia / Anxiety Co9ntrolled Re order Lorazepam   Prediabetes A1c improved to 5.8. Blood sugar well-managed without medication. - Continue monitoring blood sugar levels.  Chronic dermatitis of the elbow Chronic soreness and irritation likely due to thin skin and pressure. No itching. - Prescribed cortisone cream twice daily for one week. - Recommended elbow pad or sleeve with cushion to prevent skin reopening. - Advised dermatology consultation if no improvement.  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 - Continue current ear drop regimen as prescribed by ENT specialist.  Hypertension with secondary CKD-II Controlled HYPERTENSION Last lab with Cr 1.20 and BUN 20, eGFR >60, shows stable kidney function now in CKD-II stage Continue medication management.  General  Health Maintenance Flu shot administered. - Administered flu shot.         Orders Placed This Encounter  Procedures   Flu vaccine HIGH DOSE PF(Fluzone Trivalent)   Ambulatory referral to Pulmonology    Referral Priority:   Routine    Referral Type:   Consultation    Referral Reason:   Specialty Services Required    Requested Specialty:   Pulmonary Disease    Number of Visits Requested:   1   POCT HgB A1C    Meds ordered this encounter  Medications   triamcinolone  cream (KENALOG ) 0.1 %    Sig: Apply 1 Application topically 2 (two) times daily. For 1 week    Dispense:  30 g    Refill:  0   LORazepam  (ATIVAN ) 0.5 MG tablet    Sig: Take 1 tablet (0.5 mg total) by mouth at bedtime.    Dispense:  30 tablet    Refill:  2    Follow up plan: Return in about 6 months (around 07/05/2024) for 6 month fasting lab > 1 week later Annual Physical.  Future labs ordered for 07/06/24   Marsa Officer, DO [facility] 01/06/2024, 3:44 PM

## 2024-01-06 NOTE — Patient Instructions (Addendum)
 Thank you for coming to the office today.  Referral to Pulmonologist - they will call you with apt.  [facility] [address] [place], [place] [address] [phone]  Flu Shot today  Recent Labs    06/24/23 0755 01/06/24 1540  HGBA1C 6.1* 5.8*   No new concerns on blood sugar.  Dry irritated skin on elbow, has a break in the skin likely keeps re-opening and scabbing.  Ask the Dermatologist about this spot if it doesn't heal.  Recommend Elbow Pad / Sleeve with a cushion to avoid re-opening it.  --------------------  Use the topical steroid creams twice a day for up to 1 week, maximum duration of use per one flare is 10 to 14 days, then STOP using it and allow skin to recover. Caution with over-use may cause lightening of the skin.   Hydrocortisone on face only and the Triamcinolone  / Kenalog  on body only.  Tips to reduce Eczema Flares: For baths/showers, limit bathing to every other day if you can (max 1 x daily)  Use a gentle, unscented soap and lukewarm water (hot water is most irritating to skin) Never scrub skin with too much pressure, this causes more irritation. Pat skin dry, then leave it slightly damp. DO NOT scrub it dry. Apply steroid cream to skin and rub in all the way, wait 15 min, then apply a daily moisturizer (Vaseline, Eucerin, Aveeno). Continue daily moisturizer every day of the year (even after flare is resolved) - If you have eczema on hands or dry hands, recommend wearing any type of gloves overnight (cloth, fabric, or even nitrile/latex) to improve effect of topical moisturizer  If develops redness, honey colored crust oozing, drainage of pus, bleeding, or redness / swelling, pain, please return for re-evaluation, may have become infected after scratching.  Refills on Lorazepam    DUE for FASTING BLOOD WORK (no food or drink after midnight before the lab appointment, only water or coffee without cream/sugar on the morning of)  SCHEDULE  Lab Only visit in the morning at the clinic for lab draw in 6 MONTHS   - Make sure Lab Only appointment is at about 1 week before your next appointment, so that results will be available  For Lab Results, once available within 2-3 days of blood draw, you can can log in to MyChart online to view your results and a brief explanation. Also, we can discuss results at next follow-up visit.   Please schedule a Follow-up Appointment to: Return in about 6 months (around 07/05/2024) for 6 month fasting lab > 1 week later Annual Physical.  If you have any other questions or concerns, please feel free to call the office or send a message through MyChart. You may also schedule an earlier appointment if necessary.  Additionally, you may be receiving a survey about your experience at our office within a few days to 1 week by e-mail or mail. We value your feedback.  Marsa Officer, DO [facility] [place]

## 2024-01-06 NOTE — Progress Notes (Signed)
 poc

## 2024-01-14 ENCOUNTER — Encounter: Payer: Self-pay | Admitting: Family Medicine

## 2024-01-26 ENCOUNTER — Other Ambulatory Visit: Payer: Self-pay | Admitting: Family Medicine

## 2024-01-26 DIAGNOSIS — F4310 Post-traumatic stress disorder, unspecified: Secondary | ICD-10-CM

## 2024-01-26 DIAGNOSIS — F419 Anxiety disorder, unspecified: Secondary | ICD-10-CM

## 2024-01-28 NOTE — Telephone Encounter (Signed)
 Requested Prescriptions  Pending Prescriptions Disp Refills   FLUoxetine  (PROZAC ) 20 MG capsule [Pharmacy Med Name: FLUOXETINE  HCL 20 MG CAP] 180 capsule 0    Sig: TAKE 1 CAPSULE BY MOUTH TWICE DAILY     Psychiatry:  Antidepressants - SSRI Passed - 01/28/2024  4:21 PM      Passed - Completed PHQ-2 or PHQ-9 in the last 360 days      Passed - Valid encounter within last 6 months    Recent Outpatient Visits           3 weeks ago Pre-diabetes   [place] [facility] [place], Marsa PARAS, DO   7 months ago Annual physical exam   [place] [facility] [place] Marsa PARAS, DO   10 months ago Centrilobular emphysema [facility])   [place] [facility] [place] Marsa PARAS, DO       Future Appointments             In 2 months Stoioff, Glendia BROCKS, MD [facility]rology [facility]

## 2024-02-10 ENCOUNTER — Telehealth: Payer: Self-pay

## 2024-02-10 NOTE — Telephone Encounter (Signed)
 Thanks for letting me know. I would suggest if we can try to reach him as well to share the info. Or reach his family member, younger brother Hilmar Moldovan [phone] is the best one to call if needed  Marsa Officer, DO [facility] 02/10/2024, 12:01 PM

## 2024-02-10 NOTE — Telephone Encounter (Signed)
 Copied from CRM #[id number]. Topic: Referral - Status >> Feb 10, 2024  9:28 AM Revonda D wrote: Reason for CRM: [facility] Pulmonology wanted to inform Dr.Karamalegos that they reached out to the pt multiple times to get an appt scheduled but was unable to reach him.    -----------------------------------------------------------------------

## 2024-02-10 NOTE — Telephone Encounter (Signed)
 Left message for patient to return call OK to advise  if Sharolyn returns call

## 2024-02-11 ENCOUNTER — Telehealth: Payer: Self-pay

## 2024-02-11 NOTE — Telephone Encounter (Signed)
 Copied from CRM #[id number]. Topic: General - Other >> Feb 11, 2024 11:13 AM Joesph NOVAK wrote: Reason for CRM: Patients brother roger is returning [employer] phone call.   [phone]. Roger -

## 2024-02-11 NOTE — Telephone Encounter (Signed)
 Thank you. All we can do is keep encouraging him. I can talk to him again next apt.  Marsa Officer, DO [facility] 02/11/2024, 12:33 PM

## 2024-02-13 NOTE — Progress Notes (Signed)
 PROVIDER NOTE: Interpretation of information contained herein should be left to medically-trained personnel. Specific patient instructions are provided elsewhere under Patient Instructions section of medical record. This document was created in part using AI and STT-dictation technology, any transcriptional errors that may result from this process are unintentional.  Patient: Charles Ewing  Service: E/M   PCP: Edman Marsa PARAS, DO  DOB: 21-Jan-1941  DOS: 02/16/2024  Provider: Emmy MARLA Blanch, NP  MRN: [id number]  Delivery: Face-to-face  Specialty: Interventional Pain Management  Type: Established Patient  Setting: Ambulatory outpatient facility  Specialty designation: 09  Referring Prov.: Edman Marsa PARAS, DO  Location: Outpatient office facility       History of present illness (HPI) Charles Ewing, a 84 y.o. year old male, is here today because of his low back pain. Mr. Treichler primary complain today is Back Pain  Pertinent problems: Mr. Cromwell has Osteoarthritis of knees (Bilateral); Basal cell carcinoma of nose; Chronic pain syndrome; Pharmacologic therapy; Disorder of skeletal system; Problems influencing health status; Long term prescription benzodiazepine use; Chronic use of opiate for therapeutic purpose; Long term current use of non-steroidal anti-inflammatories (NSAID); Chronic hip pain (2ry area of Pain) (Right); Chronic lower extremity pain (3ry area of Pain) (Right); Chronic low back pain (1ry area of Pain) (Bilateral) (R>L) w/o sciatica; Lumbar facet syndrome; Dextroscoliosis of lumbar spine; DDD (degenerative disc disease), lumbosacral; Osteopenia of lumbar spine; Osteoarthritis of sacroiliac joints (HCC) (Bilateral); Osteoarthritis of hips (Bilateral); Chronic sacroiliac joint pain (Bilateral); Sacroiliac joint dysfunction (Bilateral); Spondylosis without myelopathy or radiculopathy, lumbosacral region; Other spondylosis, sacral and sacrococcygeal region; Chronic low back  pain (Bilateral) w/ sciatica (Right); Herniated nucleus pulposus, L4-5 (Right); Lumbosacral radiculopathy at L4 (Right); L4-5 disc bulge (Right); Spondylolisthesis at L4-L5 level; Grade 1 Anterolisthesis of lumbar spine (L4/L5); Chronic radicular pain of lower extremity (Right); Lumbar spondylosis; and Chronic hip pain (Left) on their pertinent problem list.  Pain Assessment: Severity of Chronic pain is reported as a 10-Worst pain ever/10. Location: Back Lower/Right leg to the ankle. Onset: More than a month ago. Quality: Sharp. Timing: Constant. Modifying factor(s): Pain medication. Vitals:  height is 5' 11.5 (1.816 m) and weight is 200 lb (90.7 kg). His temporal temperature is 97.2 F (36.2 C) (abnormal). His blood pressure is 106/62 and his pulse is 74. His respiration is 20 and oxygen saturation is 98%.  BMI: Estimated body mass index is 27.51 kg/m as calculated from the following:   Height as of this encounter: 5' 11.5 (1.816 m).   Weight as of this encounter: 200 lb (90.7 kg).  Last encounter: 11/26/2023. Last procedure: Visit date not found.  Reason for encounter: medication management.  The patient indicates doing well with current medication regimen.  No side effects or adverse reaction reported to medication.  Discussed the use of AI scribe software for clinical note transcription with the patient, who gave verbal consent to proceed.  History of Present Illness   Charles Ewing is an 84 year old male who presents with worsening back pain.  He has experienced worsening back pain, primarily located in his back. He has previously received injections for this pain, which were effective, although the last injection was administered a long time ago. Recently, he received an injection for his hip, which was also successful.  He is currently managing his back pain with oxycodone , taking two tablets a day, one in the morning and one at night, without any side effects. His pharmacy is  [facility].  No side effects or adverse reactions from oxycodone .     Pharmacotherapy Assessment   Oxycodone  (Oxy IR/Roxicodone ) 5 mg immediate release 2 times daily as needed for severe pain. MME=15  Monitoring: [place] PMP: PDMP reviewed during this encounter.       Pharmacotherapy: No side-effects or adverse reactions reported. Compliance: No problems identified. Effectiveness: Clinically acceptable.  Charles Ewing, [place]  02/16/2024  1:38 PM  Sign when Signing Visit Nursing Pain Medication Assessment:  Safety precautions to be maintained throughout the outpatient stay will include: orient to surroundings, keep bed in low position, maintain call bell within reach at all times, provide assistance with transfer out of bed and ambulation.  Medication Inspection Compliance: Pill count conducted under aseptic conditions, in front of the patient. Neither the pills nor the bottle was removed from the patient's sight at any time. Once count was completed pills were immediately returned to the patient in their original bottle.  Medication: Oxycodone  IR Pill/Patch Count: 27 of 60 pills/patches remain Pill/Patch Appearance: Markings consistent with prescribed medication Bottle Appearance: Standard pharmacy container. Clearly labeled. Filled Date: 27 / 50 / 2025 Last Medication intake:  Today    UDS:  Summary  Date Value Ref Range Status  08/20/2023 FINAL  Final    Comment:    ==================================================================== [employer] Select 13 (MW) ==================================================================== Test                             Result       Flag       Units  Drug Present and Declared for Prescription Verification   Lorazepam                       65           EXPECTED   ng/mg creat    Source of lorazepam  is a scheduled prescription medication.    Oxycodone                       688          EXPECTED   ng/mg creat   Oxymorphone                     33           EXPECTED   ng/mg creat   Noroxycodone                   1414         EXPECTED   ng/mg creat   Noroxymorphone                 33           EXPECTED   ng/mg creat    Sources of oxycodone  are scheduled prescription medications.    Oxymorphone, noroxycodone, and noroxymorphone are expected    metabolites of oxycodone . Oxymorphone is also available as a    scheduled prescription medication.  ==================================================================== Test                      Result    Flag   Units      Ref Range   Creatinine              181              mg/dL      >=79 ==================================================================== Declared Medications:  The flagging  and interpretation on this report are based on the  following declared medications.  Unexpected results may arise from  inaccuracies in the declared medications.   **Note: The testing scope of this panel includes these medications:   Lorazepam  (Ativan )  Oxycodone    **Note: The testing scope of this panel does not include the  following reported medications:   Albuterol  (Ventolin  HFA)  Budesonide (Breztri  Aerosphere)  Fluoxetine  (Prozac )  Fluticasone  (Flonase )  Formoterol (Breztri  Aerosphere)  Furosemide  (Lasix )  Glycopyrrolate (Breztri  Aerosphere)  Iron  Losartan  (Cozaar )  Meloxicam  (Mobic )  Pantoprazole  (Protonix )  Simvastatin  (Zocor )  Tamsulosin  (Flomax )  Vitamin B12 ==================================================================== For clinical consultation, please call [phone]. ====================================================================     No results found for: CBDTHCR No results found for: D8THCCBX No results found for: D9THCCBX  ROS  Constitutional: Denies any fever or chills Gastrointestinal: No reported hemesis, hematochezia, vomiting, or acute GI distress Musculoskeletal: Low back pain Neurological: No reported episodes of acute onset  apraxia, aphasia, dysarthria, agnosia, amnesia, paralysis, loss of coordination, or loss of consciousness  Medication Review  FLUoxetine , LORazepam , albuterol , budesonide-glycopyrrolate-formoterol, cyanocobalamin , ferrous sulfate, fluticasone , furosemide , losartan , meloxicam , naloxone , oxyCODONE , pantoprazole , simvastatin , tamsulosin , and triamcinolone  cream  History Review  Allergy: Mr. Packett is allergic to nexium [esomeprazole magnesium]. Drug: Mr. Schramm  reports no history of drug use. Alcohol:  reports no history of alcohol use. Tobacco:  reports that he has been smoking cigarettes. He has a 70 pack-year smoking history. He has never used smokeless tobacco. Social: Mr. Langland  reports that he has been smoking cigarettes. He has a 70 pack-year smoking history. He has never used smokeless tobacco. He reports that he does not drink alcohol and does not use drugs. Medical:  has a past medical history of Abdominal aortic aneurysm (AAA) (2013), Anxiety, Arrhythmia, Arthritis, Colon polyp, COPD (chronic obstructive pulmonary disease) (HCC), Coronary artery disease, Depression, GERD (gastroesophageal reflux disease), Hyperlipidemia, Hypertension, Skin cancer, and Thrush. Surgical: Mr. Kerney  has a past surgical history that includes Skin cancer excision; Colon polyp surgery; Transurethral resection of prostate (2010); Cataract extraction w/PHACO (Left, 05/09/2020); and Cataract extraction w/PHACO (Right, 05/23/2020). Family: family history includes Kidney cancer in his mother.  Laboratory Chemistry Profile   Renal Lab Results  Component Value Date   BUN 20 06/24/2023   CREATININE 1.20 06/24/2023   BCR SEE NOTE: 06/24/2023   GFRAA 64 01/11/2019   GFRNONAA >60 12/23/2020    Hepatic Lab Results  Component Value Date   AST 12 06/24/2023   ALT 9 06/24/2023   ALBUMIN 3.9 06/13/2021   ALKPHOS 70 06/13/2021    Electrolytes Lab Results  Component Value Date   NA 139 06/24/2023   K 4.9 06/24/2023    CL 105 06/24/2023   CALCIUM  8.8 06/24/2023   MG 2.3 06/13/2021    Bone Lab Results  Component Value Date   VD25OH 39 06/28/2022   25OHVITD1 14 (L) 06/13/2021   25OHVITD2 1.8 06/13/2021   25OHVITD3 12 06/13/2021    Inflammation (CRP: Acute Phase) (ESR: Chronic Phase) Lab Results  Component Value Date   CRP 1 06/13/2021   ESRSEDRATE 20 06/13/2021         Note: Above Lab results reviewed.  Recent Imaging Review  DG PAIN CLINIC C-ARM 1-60 MIN NO REPORT Fluoro was used, but no Radiologist interpretation will be provided.  Please refer to NOTES tab for provider progress note. Note: Reviewed        Physical Exam  Vitals: BP 106/62 (BP Location: Left Arm, Patient Position:  Sitting)   Pulse 74   Temp (!) 97.2 F (36.2 C) (Temporal)   Resp 20   Ht 5' 11.5 (1.816 m)   Wt 200 lb (90.7 kg)   SpO2 98%   BMI 27.51 kg/m  BMI: Estimated body mass index is 27.51 kg/m as calculated from the following:   Height as of this encounter: 5' 11.5 (1.816 m).   Weight as of this encounter: 200 lb (90.7 kg). Ideal: Ideal body weight: 76.5 kg (168 lb 8.7 oz) Adjusted ideal body weight: 82.2 kg (181 lb 2 oz) General appearance: Well nourished, well developed, and well hydrated. In no apparent acute distress Mental status: Alert, oriented x 3 (person, place, & time)       Respiratory: No evidence of acute respiratory distress Eyes: PERLA  Musculoskeletal: +LBP Assessment   Diagnosis Status  1. Chronic low back pain (1ry area of Pain) (Bilateral) (R>L) w/o sciatica   2. Pharmacologic therapy   3. Chronic hip pain (2ry area of Pain) (Right)   4. Chronic lower extremity pain (3ry area of Pain) (Right)   5. Osteoarthritis of hips (Bilateral)   6. Lumbar facet syndrome (Right)   7. Osteoarthritis of sacroiliac joints (HCC) (Bilateral)   8. Dextroscoliosis of lumbar spine   9. Chronic pain syndrome   10. Chronic use of opiate for therapeutic purpose     Controlled Controlled Controlled   Updated Problems: No problems updated.  Plan of Care  Problem-specific:  Assessment and Plan    Chronic low back pain Worsening symptoms. Previous injection provided relief. Managed with oxycodone  without side effects. - Continue oxycodone  as needed. - Sent prescription to pharmacy.  Chronic right hip pain Previous injection by Dr. Tanya was successful. - Schedule hip injection if needed.   Pharmacologic therapy: Patient's pain is controlled with oxycodone , will continue on current medication regimen.  Prescribing drug monitoring (PDMP) reviewed, findings consistent with the use of prescribed medication and no evidence of narcotic misuse or abuse.  Urine drug screening (UDS) up to date and consistent with the use of prescribed medication.  No side effects or adverse reaction reported to medication.  Schedule follow-up in 90 days for medication management.     Mr. CAYDENCE ENCK has a current medication list which includes the following long-term medication(s): albuterol , ferrous sulfate, fluoxetine , fluticasone , furosemide , losartan , simvastatin , [START ON 02/26/2024] oxycodone , [START ON 03/27/2024] oxycodone , and [START ON 04/26/2024] oxycodone .  Pharmacotherapy (Medications Ordered): Meds ordered this encounter  Medications   oxyCODONE  (OXY IR/ROXICODONE ) 5 MG immediate release tablet    Sig: Take 1 tablet (5 mg total) by mouth 2 (two) times daily as needed for severe pain (pain score 7-10). Each refill must last 30 days.    Dispense:  60 tablet    Refill:  0    DO NOT: delete (not duplicate); no partial-fill (will deny script to complete), no refill request (F/U required). DISPENSE: 1 day early if closed on fill date. WARN: No CNS-depressants within 8 hrs of med.   oxyCODONE  (OXY IR/ROXICODONE ) 5 MG immediate release tablet    Sig: Take 1 tablet (5 mg total) by mouth 2 (two) times daily as needed for severe pain (pain score 7-10). Each refill  must last 30 days.    Dispense:  60 tablet    Refill:  0    DO NOT: delete (not duplicate); no partial-fill (will deny script to complete), no refill request (F/U required). DISPENSE: 1 day early if closed on fill date. WARN:  No CNS-depressants within 8 hrs of med.   oxyCODONE  (OXY IR/ROXICODONE ) 5 MG immediate release tablet    Sig: Take 1 tablet (5 mg total) by mouth 2 (two) times daily as needed for severe pain (pain score 7-10). Each refill must last 30 days.    Dispense:  60 tablet    Refill:  0    DO NOT: delete (not duplicate); no partial-fill (will deny script to complete), no refill request (F/U required). DISPENSE: 1 day early if closed on fill date. WARN: No CNS-depressants within 8 hrs of med.   Orders:  No orders of the defined types were placed in this encounter.    Return in about 3 months (around 05/16/2024) for (F2F), (MM), Emmy Blanch NP.    Recent Visits Date Type Provider Dept  01/05/24 Office Visit Tanya Glisson, MD [facility]  12/11/23 Procedure visit Tanya Glisson, MD [facility]  11/26/23 Office Visit Manjit Bufano K, NP [facility]  11/20/23 Office Visit Novah Nessel K, NP [facility]  Showing recent visits within past 90 days and meeting all other requirements Today's Visits Date Type Provider Dept  02/16/24 Office Visit Nazeer Romney K, NP [facility]  Showing today's visits and meeting all other requirements Future Appointments Date Type Provider Dept  05/12/24 Appointment Codie Hainer K, NP [facility]  Showing future appointments within next 90 days and meeting all other requirements  I discussed the assessment and treatment plan with the patient. The patient was provided an opportunity to ask questions and all were answered. The patient agreed with the plan and demonstrated an understanding of the instructions.  Patient advised to call back or seek an in-person evaluation if the  symptoms or condition worsens.  I personally spent a total of 30 minutes in the care of the patient today including preparing to see the patient, getting/reviewing separately obtained history, performing a medically appropriate exam/evaluation, counseling and educating, placing orders, referring and communicating with other health care professionals, documenting clinical information in the EHR, independently interpreting results, communicating results, and coordinating care.   Note by: Makalia Bare K Raynard Mapps, NP (TTS and AI technology used. I apologize for any typographical errors that were not detected and corrected.) Date: 02/16/2024; Time: 2:34 PM

## 2024-02-16 ENCOUNTER — Ambulatory Visit: Attending: Nurse Practitioner | Admitting: Nurse Practitioner

## 2024-02-16 ENCOUNTER — Encounter: Payer: Self-pay | Admitting: Nurse Practitioner

## 2024-02-16 DIAGNOSIS — M545 Low back pain, unspecified: Secondary | ICD-10-CM | POA: Insufficient documentation

## 2024-02-16 DIAGNOSIS — G894 Chronic pain syndrome: Secondary | ICD-10-CM | POA: Insufficient documentation

## 2024-02-16 DIAGNOSIS — Z79899 Other long term (current) drug therapy: Secondary | ICD-10-CM | POA: Insufficient documentation

## 2024-02-16 DIAGNOSIS — G8929 Other chronic pain: Secondary | ICD-10-CM | POA: Insufficient documentation

## 2024-02-16 DIAGNOSIS — M461 Sacroiliitis, not elsewhere classified: Secondary | ICD-10-CM | POA: Diagnosis not present

## 2024-02-16 DIAGNOSIS — Z79891 Long term (current) use of opiate analgesic: Secondary | ICD-10-CM | POA: Diagnosis not present

## 2024-02-16 DIAGNOSIS — M160 Bilateral primary osteoarthritis of hip: Secondary | ICD-10-CM | POA: Insufficient documentation

## 2024-02-16 DIAGNOSIS — M47816 Spondylosis without myelopathy or radiculopathy, lumbar region: Secondary | ICD-10-CM | POA: Insufficient documentation

## 2024-02-16 DIAGNOSIS — M79604 Pain in right leg: Secondary | ICD-10-CM | POA: Insufficient documentation

## 2024-02-16 DIAGNOSIS — M25551 Pain in right hip: Secondary | ICD-10-CM | POA: Diagnosis not present

## 2024-02-16 DIAGNOSIS — M4186 Other forms of scoliosis, lumbar region: Secondary | ICD-10-CM | POA: Diagnosis not present

## 2024-02-16 MED ORDER — OXYCODONE HCL 5 MG PO TABS: 5.0000 mg | ORAL_TABLET | Freq: Two times a day (BID) | ORAL | 0 refills | Status: AC | PRN

## 2024-02-16 NOTE — Progress Notes (Signed)
 Nursing Pain Medication Assessment:  Safety precautions to be maintained throughout the outpatient stay will include: orient to surroundings, keep bed in low position, maintain call bell within reach at all times, provide assistance with transfer out of bed and ambulation.  Medication Inspection Compliance: Pill count conducted under aseptic conditions, in front of the patient. Neither the pills nor the bottle was removed from the patient's sight at any time. Once count was completed pills were immediately returned to the patient in their original bottle.  Medication: Oxycodone  IR Pill/Patch Count: 27 of 60 pills/patches remain Pill/Patch Appearance: Markings consistent with prescribed medication Bottle Appearance: Standard pharmacy container. Clearly labeled. Filled Date: 62 / 28 / 2025 Last Medication intake:  Today

## 2024-02-16 NOTE — Patient Instructions (Signed)

## 2024-02-25 ENCOUNTER — Emergency Department
Admission: EM | Admit: 2024-02-25 | Discharge: 2024-02-25 | Disposition: A | Discharge status: Home / Self Care | Attending: Emergency Medicine | Admitting: Emergency Medicine

## 2024-02-25 ENCOUNTER — Emergency Department

## 2024-02-25 DIAGNOSIS — N189 Chronic kidney disease, unspecified: Secondary | ICD-10-CM | POA: Diagnosis not present

## 2024-02-25 DIAGNOSIS — I2510 Atherosclerotic heart disease of native coronary artery without angina pectoris: Secondary | ICD-10-CM | POA: Insufficient documentation

## 2024-02-25 DIAGNOSIS — I129 Hypertensive chronic kidney disease with stage 1 through stage 4 chronic kidney disease, or unspecified chronic kidney disease: Secondary | ICD-10-CM | POA: Diagnosis not present

## 2024-02-25 DIAGNOSIS — J441 Chronic obstructive pulmonary disease with (acute) exacerbation: Secondary | ICD-10-CM | POA: Diagnosis not present

## 2024-02-25 DIAGNOSIS — R0602 Shortness of breath: Secondary | ICD-10-CM | POA: Diagnosis present

## 2024-02-25 LAB — CBC
HCT: 45.7 % (ref 39.0–52.0)
Hemoglobin: 14.8 g/dL (ref 13.0–17.0)
MCH: 30.7 pg (ref 26.0–34.0)
MCHC: 32.4 g/dL (ref 30.0–36.0)
MCV: 94.8 fL (ref 80.0–100.0)
Platelets: 233 K/uL (ref 150–400)
RBC: 4.82 MIL/uL (ref 4.22–5.81)
RDW: 14 % (ref 11.5–15.5)
WBC: 6.6 K/uL (ref 4.0–10.5)
nRBC: 0 % (ref 0.0–0.2)

## 2024-02-25 LAB — BASIC METABOLIC PANEL WITH GFR
Anion gap: 7 (ref 5–15)
BUN: 12 mg/dL (ref 8–23)
CO2: 29 mmol/L (ref 22–32)
Calcium: 9 mg/dL (ref 8.9–10.3)
Chloride: 101 mmol/L (ref 98–111)
Creatinine, Ser: 1.09 mg/dL (ref 0.61–1.24)
GFR, Estimated: >60 mL/min
Glucose, Bld: 110 mg/dL — ABNORMAL HIGH (ref 70–99)
Potassium: 4.4 mmol/L (ref 3.5–5.1)
Sodium: 137 mmol/L (ref 135–145)

## 2024-02-25 LAB — TROPONIN T, HIGH SENSITIVITY: Troponin T High Sensitivity: 23 ng/L — ABNORMAL HIGH (ref 0–19)

## 2024-02-25 MED ORDER — PREDNISONE 20 MG PO TABS: 60.0000 mg | ORAL_TABLET | Freq: Every day | ORAL | 0 refills | Status: AC

## 2024-02-25 MED ORDER — IPRATROPIUM-ALBUTEROL 0.5-2.5 (3) MG/3ML IN SOLN
3.0000 mL | Freq: Once | RESPIRATORY_TRACT | Status: AC
  Administered 2024-02-25: 3 mL via RESPIRATORY_TRACT
  Filled 2024-02-25: qty 3

## 2024-02-25 MED ORDER — IPRATROPIUM-ALBUTEROL 0.5-2.5 (3) MG/3ML IN SOLN
3.0000 mL | Freq: Once | RESPIRATORY_TRACT | Status: AC
  Administered 2024-02-25: 3 mL via RESPIRATORY_TRACT
  Filled 2024-02-25 (×2): qty 3

## 2024-02-25 MED ORDER — PREDNISONE 20 MG PO TABS
60.0000 mg | ORAL_TABLET | Freq: Once | ORAL | Status: AC
  Administered 2024-02-25: 60 mg via ORAL
  Filled 2024-02-25 (×2): qty 3

## 2024-02-25 NOTE — ED Provider Triage Note (Signed)
 Emergency Medicine Provider Triage Evaluation Note  Charles Ewing , a 84 y.o. male  was evaluated in triage.  Pt complains of shortness of breath on exertion.  Review of Systems  Positive:  Negative:   Physical Exam  BP (!) 147/88   Pulse 74   Temp 98.4 F (36.9 C) (Oral)   Resp 18   Ht 5' 11.5 (1.816 m)   Wt 90 kg   SpO2 94%   BMI 27.29 kg/m  Gen:   Awake, no distress   Resp:  Normal effort  MSK:   Moves extremities without difficulty  Other:    Medical Decision Making  Medically screening exam initiated at 5:32 PM.  Appropriate orders placed.  Charles Ewing was informed that the remainder of the evaluation will be completed by another provider, this initial triage assessment does not replace that evaluation, and the importance of remaining in the ED until their evaluation is complete.     Gasper Devere ORN, PA-C 02/25/24 1732

## 2024-02-25 NOTE — ED Triage Notes (Signed)
 Pt arrives via EMS from home with reports of SOB x2 weeks. Pt used home nebs with no relief.  Hx of COPD and still smokes EMS vitals- 95% RA 199/108 75 HR

## 2024-02-25 NOTE — ED Provider Notes (Signed)
 "  [facility] [place] Provider Note    Event Date/Time   First MD Initiated Contact with Patient 02/25/24 1816     (approximate)   History   Chief Complaint Shortness of Breath   HPI  Charles Ewing is a 84 y.o. male with past medical history of hypertension, CAD, COPD, CKD, AAA, and chronic pain syndrome who presents to the ED complaining of shortness of breath.  Patient reports that he has been dealing with increasing dyspnea on exertion over the past week.  This been associated with nonproductive cough as well as a runny nose.  He reports feeling like he might have caught the flu, but denies any GI symptoms and is not aware of any sick contacts.  He has not had any pain into his chest and denies any pain or swelling in his legs.  He denies any difficulty breathing while at rest.     Physical Exam   Triage Vital Signs: ED Triage Vitals [02/25/24 1728]  Encounter Vitals Group     BP (!) 147/88     Girls Systolic BP Percentile      Girls Diastolic BP Percentile      Boys Systolic BP Percentile      Boys Diastolic BP Percentile      Pulse Rate 74     Resp 18     Temp 98.4 F (36.9 C)     Temp Source Oral     SpO2 94 %     Weight 198 lb 6.6 oz (90 kg)     Height 5' 11.5 (1.816 m)     Head Circumference      Peak Flow      Pain Score 0     Pain Loc      Pain Education      Exclude from Growth Chart     Most recent vital signs: Vitals:   02/25/24 1815 02/25/24 1942  BP:  (!) 145/79  Pulse:  75  Resp:  18  Temp:  (!) 97.4 F (36.3 C)  SpO2: 95% 100%    Constitutional: Alert and oriented. Eyes: Conjunctivae are normal. Head: Atraumatic. Nose: No congestion/rhinnorhea. Mouth/Throat: Mucous membranes are moist.  Cardiovascular: Normal rate, regular rhythm. Grossly normal heart sounds.  2+ radial pulses bilaterally. Respiratory: Normal respiratory effort.  No retractions. Lungs CTAB.  Mild inspiratory and expiratory wheezing  noted. Gastrointestinal: Soft and nontender. No distention. Musculoskeletal: No lower extremity tenderness nor edema.  Neurologic:  Normal speech and language. No gross focal neurologic deficits are appreciated.    ED Results / Procedures / Treatments   Labs (all labs ordered are listed, but only abnormal results are displayed) Labs Reviewed  BASIC METABOLIC PANEL WITH GFR - Abnormal; Notable for the following components:      Result Value   Glucose, Bld 110 (*)    All other components within normal limits  TROPONIN T, HIGH SENSITIVITY - Abnormal; Notable for the following components:   Troponin T High Sensitivity 23 (*)    All other components within normal limits  CBC     EKG  ED ECG REPORT I, Carlin Palin, the attending physician, personally viewed and interpreted this ECG.   Date: 02/25/2024  EKG Time: 17:32  Rate: 74  Rhythm: normal sinus rhythm  Axis: Normal  Intervals:none  ST&T Change: None  RADIOLOGY Chest x-ray reviewed and interpreted by me with no infiltrate, edema, or effusion.  PROCEDURES:  Critical Care performed: No  Procedures  MEDICATIONS ORDERED IN ED: Medications  ipratropium-albuterol  (DUONEB) 0.5-2.5 (3) MG/3ML nebulizer solution 3 mL (3 mLs Nebulization Given 02/25/24 1855)  predniSONE  (DELTASONE ) tablet 60 mg (60 mg Oral Given 02/25/24 1854)  ipratropium-albuterol  (DUONEB) 0.5-2.5 (3) MG/3ML nebulizer solution 3 mL (3 mLs Nebulization Given 02/25/24 2022)     IMPRESSION / MDM / ASSESSMENT AND PLAN / ED COURSE  I reviewed the triage vital signs and the nursing notes.                              84 y.o. male with past medical history of hypertension, CAD, COPD, CKD, AAA, and CPS who presents to the ED complaining of increasing dyspnea on exertion with cough and runny nose for the past week.  Patient's presentation is most consistent with acute presentation with potential threat to life or bodily function.  Differential diagnosis  includes, but is not limited to, viral syndrome, pneumonia, COPD exacerbation, CHF, anemia, electrolyte abnormality, AKI, ACS.  Patient nontoxic-appearing and in no acute distress, vital signs are unremarkable.  He is not in any respiratory distress and maintaining oxygen saturations at 95% on room air, does have inspiratory and expiratory wheezing on exam.  EKG shows no evidence of arrhythmia or ischemia, troponin pending but low suspicion for cardiac etiology.  Chest x-ray negative for acute process and labs without significant anemia, leukocytosis, electrolyte abnormality, or AKI.  We will treat wheezing with prednisone  and DuoNeb, reassess following treatment.  Patient reports feeling significantly better following DuoNeb and prednisone , does have some ongoing wheezing and will give additional breathing treatment.  Troponin very mildly elevated but suspect this is as baseline as no chest pain or EKG changes to suggest ACS.  With reassuring workup and improving symptoms, patient appropriate for discharge home with outpatient follow-up, says he has an appointment next week to establish with pulmonology.  We will prescribe course of prednisone  and he states he has albuterol  available at home.  He was counseled to return to the ED for new or worsening symptoms, patient and brother agree with plan.      FINAL CLINICAL IMPRESSION(S) / ED DIAGNOSES   Final diagnoses:  COPD exacerbation (HCC)     Rx / DC Orders   ED Discharge Orders          Ordered    predniSONE  (DELTASONE ) 20 MG tablet  Daily with breakfast        02/25/24 2052             Note:  This document was prepared using Dragon voice recognition software and may include unintentional dictation errors.   Willo Dunnings, MD 02/25/24 2053  "

## 2024-02-25 NOTE — ED Triage Notes (Signed)
 Refer to first nurse note. Pt presents to the ED via [facility] from home with [facility] with exertion x2 weeks. Pt A&Ox4.

## 2024-02-25 NOTE — ED Notes (Signed)
Patient verbalizes understanding of discharge instructions. Opportunity for questioning and answers were provided. Armband removed by staff, pt discharged from ED. Wheeled out to car with son

## 2024-04-19 ENCOUNTER — Other Ambulatory Visit

## 2024-04-23 ENCOUNTER — Ambulatory Visit: Admitting: Urology

## 2024-05-12 ENCOUNTER — Encounter: Admitting: Nurse Practitioner

## 2024-07-06 ENCOUNTER — Other Ambulatory Visit

## 2024-07-12 ENCOUNTER — Encounter: Admitting: Family Medicine
